# Patient Record
Sex: Male | Born: 1955 | Race: White | Hispanic: No | Marital: Married | Smoking: Current every day smoker
Health system: Southern US, Community
[De-identification: ages and names within clinical notes are randomized; demographics above are authoritative.]

## PROBLEM LIST (undated history)

## (undated) DIAGNOSIS — I10 Essential (primary) hypertension: Secondary | ICD-10-CM

## (undated) DIAGNOSIS — G5793 Unspecified mononeuropathy of bilateral lower limbs: Secondary | ICD-10-CM

## (undated) DIAGNOSIS — M1990 Unspecified osteoarthritis, unspecified site: Secondary | ICD-10-CM

## (undated) DIAGNOSIS — Z9221 Personal history of antineoplastic chemotherapy: Secondary | ICD-10-CM

## (undated) DIAGNOSIS — K759 Inflammatory liver disease, unspecified: Secondary | ICD-10-CM

## (undated) DIAGNOSIS — K219 Gastro-esophageal reflux disease without esophagitis: Secondary | ICD-10-CM

## (undated) DIAGNOSIS — I639 Cerebral infarction, unspecified: Secondary | ICD-10-CM

## (undated) DIAGNOSIS — I4891 Unspecified atrial fibrillation: Secondary | ICD-10-CM

## (undated) DIAGNOSIS — E119 Type 2 diabetes mellitus without complications: Secondary | ICD-10-CM

## (undated) DIAGNOSIS — R0600 Dyspnea, unspecified: Secondary | ICD-10-CM

## (undated) DIAGNOSIS — C801 Malignant (primary) neoplasm, unspecified: Secondary | ICD-10-CM

## (undated) DIAGNOSIS — M75100 Unspecified rotator cuff tear or rupture of unspecified shoulder, not specified as traumatic: Secondary | ICD-10-CM

## (undated) DIAGNOSIS — R200 Anesthesia of skin: Secondary | ICD-10-CM

## (undated) DIAGNOSIS — J449 Chronic obstructive pulmonary disease, unspecified: Secondary | ICD-10-CM

## (undated) HISTORY — PX: TRIGGER FINGER RELEASE: SHX641

## (undated) HISTORY — PX: ARTHROSCOPIC REPAIR ACL: SUR80

## (undated) HISTORY — PX: FOOT SURGERY: SHX648

## (undated) HISTORY — PX: HAND SURGERY: SHX662

---

## 1999-07-21 ENCOUNTER — Encounter: Payer: Self-pay | Admitting: Gastroenterology

## 1999-07-21 ENCOUNTER — Encounter: Admission: RE | Admit: 1999-07-21 | Discharge: 1999-07-21 | Payer: Self-pay | Admitting: Gastroenterology

## 2004-11-13 ENCOUNTER — Encounter: Admission: RE | Admit: 2004-11-13 | Discharge: 2004-11-13 | Payer: Self-pay | Admitting: Gastroenterology

## 2005-07-08 ENCOUNTER — Ambulatory Visit (HOSPITAL_COMMUNITY): Admission: RE | Admit: 2005-07-08 | Discharge: 2005-07-08 | Payer: Self-pay | Admitting: *Deleted

## 2006-04-15 ENCOUNTER — Ambulatory Visit (HOSPITAL_COMMUNITY): Admission: RE | Admit: 2006-04-15 | Discharge: 2006-04-15 | Payer: Self-pay | Admitting: Orthopedic Surgery

## 2006-04-29 ENCOUNTER — Ambulatory Visit (HOSPITAL_COMMUNITY): Admission: RE | Admit: 2006-04-29 | Discharge: 2006-04-30 | Payer: Self-pay | Admitting: Orthopedic Surgery

## 2006-06-14 ENCOUNTER — Encounter: Admission: RE | Admit: 2006-06-14 | Discharge: 2006-06-14 | Payer: Self-pay | Admitting: Family Medicine

## 2007-06-08 DIAGNOSIS — J189 Pneumonia, unspecified organism: Secondary | ICD-10-CM

## 2007-06-08 HISTORY — PX: OTHER SURGICAL HISTORY: SHX169

## 2007-06-08 HISTORY — PX: LOBECTOMY: SHX5089

## 2007-06-08 HISTORY — DX: Pneumonia, unspecified organism: J18.9

## 2007-11-17 ENCOUNTER — Encounter: Admission: RE | Admit: 2007-11-17 | Discharge: 2007-11-17 | Payer: Self-pay | Admitting: Family Medicine

## 2007-11-20 ENCOUNTER — Ambulatory Visit: Payer: Self-pay | Admitting: Pulmonary Disease

## 2007-11-20 DIAGNOSIS — R930 Abnormal findings on diagnostic imaging of skull and head, not elsewhere classified: Secondary | ICD-10-CM

## 2007-11-20 DIAGNOSIS — R042 Hemoptysis: Secondary | ICD-10-CM | POA: Insufficient documentation

## 2007-11-23 ENCOUNTER — Ambulatory Visit: Payer: Self-pay | Admitting: Pulmonary Disease

## 2007-11-23 ENCOUNTER — Ambulatory Visit: Admission: RE | Admit: 2007-11-23 | Discharge: 2007-11-23 | Payer: Self-pay | Admitting: Pulmonary Disease

## 2007-11-23 ENCOUNTER — Encounter: Payer: Self-pay | Admitting: Pulmonary Disease

## 2007-11-27 ENCOUNTER — Ambulatory Visit: Payer: Self-pay | Admitting: Pulmonary Disease

## 2007-11-27 DIAGNOSIS — C3492 Malignant neoplasm of unspecified part of left bronchus or lung: Secondary | ICD-10-CM

## 2007-12-01 ENCOUNTER — Ambulatory Visit: Admission: RE | Admit: 2007-12-01 | Discharge: 2007-12-01 | Payer: Self-pay | Admitting: Cardiology

## 2007-12-01 ENCOUNTER — Ambulatory Visit (HOSPITAL_COMMUNITY): Admission: RE | Admit: 2007-12-01 | Discharge: 2007-12-01 | Payer: Self-pay | Admitting: Pulmonary Disease

## 2007-12-01 ENCOUNTER — Encounter: Payer: Self-pay | Admitting: Pulmonary Disease

## 2007-12-04 ENCOUNTER — Telehealth (INDEPENDENT_AMBULATORY_CARE_PROVIDER_SITE_OTHER): Payer: Self-pay | Admitting: *Deleted

## 2007-12-19 ENCOUNTER — Ambulatory Visit: Payer: Self-pay | Admitting: Thoracic Surgery

## 2007-12-28 ENCOUNTER — Ambulatory Visit: Payer: Self-pay | Admitting: Pulmonary Disease

## 2007-12-28 ENCOUNTER — Inpatient Hospital Stay (HOSPITAL_COMMUNITY): Admission: RE | Admit: 2007-12-28 | Discharge: 2008-01-08 | Payer: Self-pay | Admitting: Thoracic Surgery

## 2007-12-28 ENCOUNTER — Encounter: Payer: Self-pay | Admitting: Thoracic Surgery

## 2008-01-02 ENCOUNTER — Ambulatory Visit: Payer: Self-pay | Admitting: Thoracic Surgery

## 2008-01-12 ENCOUNTER — Telehealth (INDEPENDENT_AMBULATORY_CARE_PROVIDER_SITE_OTHER): Payer: Self-pay | Admitting: *Deleted

## 2008-01-17 ENCOUNTER — Encounter: Admission: RE | Admit: 2008-01-17 | Discharge: 2008-01-17 | Payer: Self-pay | Admitting: Thoracic Surgery

## 2008-01-17 ENCOUNTER — Ambulatory Visit: Payer: Self-pay | Admitting: Thoracic Surgery

## 2008-01-18 ENCOUNTER — Observation Stay (HOSPITAL_COMMUNITY): Admission: AD | Admit: 2008-01-18 | Discharge: 2008-01-23 | Payer: Self-pay | Admitting: Thoracic Surgery

## 2008-02-07 ENCOUNTER — Ambulatory Visit: Payer: Self-pay | Admitting: Internal Medicine

## 2008-02-07 ENCOUNTER — Encounter: Admission: RE | Admit: 2008-02-07 | Discharge: 2008-02-07 | Payer: Self-pay | Admitting: Thoracic Surgery

## 2008-02-07 ENCOUNTER — Ambulatory Visit: Payer: Self-pay | Admitting: Thoracic Surgery

## 2008-02-13 ENCOUNTER — Ambulatory Visit: Payer: Self-pay | Admitting: Pulmonary Disease

## 2008-02-21 LAB — CBC WITH DIFFERENTIAL/PLATELET
Basophils Absolute: 0.1 10*3/uL (ref 0.0–0.1)
EOS%: 3.5 % (ref 0.0–7.0)
HCT: 39.4 % (ref 38.7–49.9)
HGB: 13.4 g/dL (ref 13.0–17.1)
MCH: 31.9 pg (ref 28.0–33.4)
MCV: 93.9 fL (ref 81.6–98.0)
MONO%: 7.9 % (ref 0.0–13.0)
NEUT%: 65.6 % (ref 40.0–75.0)
Platelets: 426 10*3/uL — ABNORMAL HIGH (ref 145–400)

## 2008-02-21 LAB — COMPREHENSIVE METABOLIC PANEL
AST: 12 U/L (ref 0–37)
Alkaline Phosphatase: 67 U/L (ref 39–117)
BUN: 6 mg/dL (ref 6–23)
Calcium: 9.4 mg/dL (ref 8.4–10.5)
Chloride: 102 mEq/L (ref 96–112)
Creatinine, Ser: 1.11 mg/dL (ref 0.40–1.50)

## 2008-02-26 LAB — CBC WITH DIFFERENTIAL/PLATELET
Basophils Absolute: 0 10*3/uL (ref 0.0–0.1)
EOS%: 0 % (ref 0.0–7.0)
HCT: 42.1 % (ref 38.7–49.9)
HGB: 14.1 g/dL (ref 13.0–17.1)
LYMPH%: 10.8 % — ABNORMAL LOW (ref 14.0–48.0)
MCH: 31.5 pg (ref 28.0–33.4)
NEUT%: 84.7 % — ABNORMAL HIGH (ref 40.0–75.0)
Platelets: 454 10*3/uL — ABNORMAL HIGH (ref 145–400)
lymph#: 1.3 10*3/uL (ref 0.9–3.3)

## 2008-02-26 LAB — COMPREHENSIVE METABOLIC PANEL
AST: 14 U/L (ref 0–37)
BUN: 12 mg/dL (ref 6–23)
CO2: 20 mEq/L (ref 19–32)
Calcium: 9.2 mg/dL (ref 8.4–10.5)
Chloride: 100 mEq/L (ref 96–112)
Creatinine, Ser: 1.02 mg/dL (ref 0.40–1.50)
Total Bilirubin: 0.4 mg/dL (ref 0.3–1.2)

## 2008-02-26 LAB — MAGNESIUM: Magnesium: 1.7 mg/dL (ref 1.5–2.5)

## 2008-02-28 ENCOUNTER — Ambulatory Visit: Payer: Self-pay | Admitting: Thoracic Surgery

## 2008-02-28 ENCOUNTER — Encounter: Admission: RE | Admit: 2008-02-28 | Discharge: 2008-02-28 | Payer: Self-pay | Admitting: Thoracic Surgery

## 2008-02-28 ENCOUNTER — Ambulatory Visit (HOSPITAL_COMMUNITY): Admission: RE | Admit: 2008-02-28 | Discharge: 2008-02-28 | Payer: Self-pay | Admitting: Internal Medicine

## 2008-03-04 LAB — COMPREHENSIVE METABOLIC PANEL
AST: 32 U/L (ref 0–37)
Albumin: 4.4 g/dL (ref 3.5–5.2)
BUN: 15 mg/dL (ref 6–23)
CO2: 28 mEq/L (ref 19–32)
Calcium: 9.8 mg/dL (ref 8.4–10.5)
Chloride: 92 mEq/L — ABNORMAL LOW (ref 96–112)
Glucose, Bld: 143 mg/dL — ABNORMAL HIGH (ref 70–99)
Potassium: 3.5 mEq/L (ref 3.5–5.3)

## 2008-03-04 LAB — CBC WITH DIFFERENTIAL/PLATELET
Basophils Absolute: 0 10*3/uL (ref 0.0–0.1)
Eosinophils Absolute: 0.1 10*3/uL (ref 0.0–0.5)
HCT: 43.8 % (ref 38.7–49.9)
HGB: 14.9 g/dL (ref 13.0–17.1)
NEUT#: 6.6 10*3/uL — ABNORMAL HIGH (ref 1.5–6.5)
RDW: 15.1 % — ABNORMAL HIGH (ref 11.2–14.6)
lymph#: 2.6 10*3/uL (ref 0.9–3.3)

## 2008-03-04 LAB — MAGNESIUM: Magnesium: 1.6 mg/dL (ref 1.5–2.5)

## 2008-03-11 LAB — MAGNESIUM: Magnesium: 1.3 mg/dL — ABNORMAL LOW (ref 1.5–2.5)

## 2008-03-11 LAB — CBC WITH DIFFERENTIAL/PLATELET
Basophils Absolute: 0.1 10*3/uL (ref 0.0–0.1)
Eosinophils Absolute: 0 10*3/uL (ref 0.0–0.5)
HCT: 39.6 % (ref 38.7–49.9)
HGB: 13.3 g/dL (ref 13.0–17.1)
LYMPH%: 12 % — ABNORMAL LOW (ref 14.0–48.0)
MCV: 93.5 fL (ref 81.6–98.0)
MONO%: 1.2 % (ref 0.0–13.0)
NEUT#: 20.3 10*3/uL — ABNORMAL HIGH (ref 1.5–6.5)
NEUT%: 86.3 % — ABNORMAL HIGH (ref 40.0–75.0)
Platelets: 230 10*3/uL (ref 145–400)

## 2008-03-11 LAB — COMPREHENSIVE METABOLIC PANEL
Alkaline Phosphatase: 78 U/L (ref 39–117)
BUN: 14 mg/dL (ref 6–23)
CO2: 29 mEq/L (ref 19–32)
Glucose, Bld: 113 mg/dL — ABNORMAL HIGH (ref 70–99)
Total Bilirubin: 0.3 mg/dL (ref 0.3–1.2)

## 2008-03-18 LAB — COMPREHENSIVE METABOLIC PANEL
ALT: 18 U/L (ref 0–53)
Albumin: 3.6 g/dL (ref 3.5–5.2)
CO2: 26 mEq/L (ref 19–32)
Calcium: 9.6 mg/dL (ref 8.4–10.5)
Chloride: 102 mEq/L (ref 96–112)
Potassium: 4.6 mEq/L (ref 3.5–5.3)
Sodium: 138 mEq/L (ref 135–145)
Total Protein: 6.2 g/dL (ref 6.0–8.3)

## 2008-03-18 LAB — MAGNESIUM: Magnesium: 1.8 mg/dL (ref 1.5–2.5)

## 2008-03-18 LAB — CBC WITH DIFFERENTIAL/PLATELET
BASO%: 1.4 % (ref 0.0–2.0)
EOS%: 1.7 % (ref 0.0–7.0)
HCT: 38.2 % — ABNORMAL LOW (ref 38.7–49.9)
MCH: 32.2 pg (ref 28.0–33.4)
MCHC: 34.6 g/dL (ref 32.0–35.9)
NEUT%: 70.1 % (ref 40.0–75.0)
RDW: 15.2 % — ABNORMAL HIGH (ref 11.2–14.6)
lymph#: 2.4 10*3/uL (ref 0.9–3.3)

## 2008-03-26 ENCOUNTER — Ambulatory Visit: Payer: Self-pay | Admitting: Internal Medicine

## 2008-03-26 LAB — COMPREHENSIVE METABOLIC PANEL
ALT: 24 U/L (ref 0–53)
AST: 15 U/L (ref 0–37)
Albumin: 4.4 g/dL (ref 3.5–5.2)
Calcium: 9.5 mg/dL (ref 8.4–10.5)
Chloride: 102 mEq/L (ref 96–112)
Potassium: 4.8 mEq/L (ref 3.5–5.3)
Sodium: 139 mEq/L (ref 135–145)
Total Protein: 7.2 g/dL (ref 6.0–8.3)

## 2008-03-26 LAB — CBC WITH DIFFERENTIAL/PLATELET
BASO%: 0.4 % (ref 0.0–2.0)
Basophils Absolute: 0.1 10*3/uL (ref 0.0–0.1)
EOS%: 0.4 % (ref 0.0–7.0)
HGB: 13.7 g/dL (ref 13.0–17.1)
MCH: 31.3 pg (ref 28.0–33.4)
RDW: 15.9 % — ABNORMAL HIGH (ref 11.2–14.6)
lymph#: 3.4 10*3/uL — ABNORMAL HIGH (ref 0.9–3.3)

## 2008-04-02 ENCOUNTER — Encounter: Admission: RE | Admit: 2008-04-02 | Discharge: 2008-04-02 | Payer: Self-pay | Admitting: Thoracic Surgery

## 2008-04-02 ENCOUNTER — Ambulatory Visit: Payer: Self-pay | Admitting: Thoracic Surgery

## 2008-04-02 LAB — CBC WITH DIFFERENTIAL/PLATELET
BASO%: 0.4 % (ref 0.0–2.0)
EOS%: 0.2 % (ref 0.0–7.0)
HCT: 40.8 % (ref 38.7–49.9)
LYMPH%: 13.3 % — ABNORMAL LOW (ref 14.0–48.0)
MCH: 30.8 pg (ref 28.0–33.4)
MCHC: 33.5 g/dL (ref 32.0–35.9)
NEUT%: 80.8 % — ABNORMAL HIGH (ref 40.0–75.0)
RBC: 4.44 10*6/uL (ref 4.20–5.71)
lymph#: 2.4 10*3/uL (ref 0.9–3.3)

## 2008-04-02 LAB — COMPREHENSIVE METABOLIC PANEL
ALT: 12 U/L (ref 0–53)
AST: 15 U/L (ref 0–37)
Chloride: 103 mEq/L (ref 96–112)
Creatinine, Ser: 1.43 mg/dL (ref 0.40–1.50)
Total Bilirubin: 0.3 mg/dL (ref 0.3–1.2)

## 2008-04-08 LAB — CBC WITH DIFFERENTIAL/PLATELET
Basophils Absolute: 0 10*3/uL (ref 0.0–0.1)
LYMPH%: 7.5 % — ABNORMAL LOW (ref 14.0–48.0)
MCH: 30.8 pg (ref 28.0–33.4)
MCV: 90 fL (ref 81.6–98.0)
NEUT#: 15.3 10*3/uL — ABNORMAL HIGH (ref 1.5–6.5)
NEUT%: 86.9 % — ABNORMAL HIGH (ref 40.0–75.0)
RBC: 4.37 10*6/uL (ref 4.20–5.71)
RDW: 15.5 % — ABNORMAL HIGH (ref 11.2–14.6)

## 2008-04-08 LAB — MAGNESIUM: Magnesium: 1.9 mg/dL (ref 1.5–2.5)

## 2008-04-08 LAB — COMPREHENSIVE METABOLIC PANEL
ALT: 12 U/L (ref 0–53)
BUN: 17 mg/dL (ref 6–23)
CO2: 21 mEq/L (ref 19–32)
Calcium: 9.8 mg/dL (ref 8.4–10.5)
Chloride: 102 mEq/L (ref 96–112)
Creatinine, Ser: 1.24 mg/dL (ref 0.40–1.50)
Glucose, Bld: 138 mg/dL — ABNORMAL HIGH (ref 70–99)
Total Bilirubin: 0.3 mg/dL (ref 0.3–1.2)

## 2008-04-29 LAB — COMPREHENSIVE METABOLIC PANEL
AST: 14 U/L (ref 0–37)
Albumin: 4.4 g/dL (ref 3.5–5.2)
Alkaline Phosphatase: 77 U/L (ref 39–117)
BUN: 18 mg/dL (ref 6–23)
Creatinine, Ser: 1.13 mg/dL (ref 0.40–1.50)
Potassium: 5 mEq/L (ref 3.5–5.3)
Total Bilirubin: 0.4 mg/dL (ref 0.3–1.2)

## 2008-04-29 LAB — CBC WITH DIFFERENTIAL/PLATELET
Basophils Absolute: 0 10*3/uL (ref 0.0–0.1)
EOS%: 0.1 % (ref 0.0–7.0)
HCT: 39.2 % (ref 38.7–49.9)
HGB: 13.4 g/dL (ref 13.0–17.1)
MCH: 31.2 pg (ref 28.0–33.4)
MONO#: 0.6 10*3/uL (ref 0.1–0.9)
NEUT#: 18.4 10*3/uL — ABNORMAL HIGH (ref 1.5–6.5)
RDW: 15.9 % — ABNORMAL HIGH (ref 11.2–14.6)
WBC: 20.2 10*3/uL — ABNORMAL HIGH (ref 4.0–10.0)
lymph#: 1.3 10*3/uL (ref 0.9–3.3)

## 2008-05-14 ENCOUNTER — Ambulatory Visit (HOSPITAL_COMMUNITY): Admission: RE | Admit: 2008-05-14 | Discharge: 2008-05-14 | Payer: Self-pay | Admitting: Internal Medicine

## 2008-05-16 ENCOUNTER — Ambulatory Visit: Payer: Self-pay | Admitting: Internal Medicine

## 2008-05-20 LAB — CBC WITH DIFFERENTIAL/PLATELET
Eosinophils Absolute: 0 10*3/uL (ref 0.0–0.5)
MCV: 94.9 fL (ref 81.6–98.0)
MONO%: 8.4 % (ref 0.0–13.0)
NEUT#: 11.7 10*3/uL — ABNORMAL HIGH (ref 1.5–6.5)
RBC: 3.89 10*6/uL — ABNORMAL LOW (ref 4.20–5.71)
RDW: 16.9 % — ABNORMAL HIGH (ref 11.2–14.6)
WBC: 15.1 10*3/uL — ABNORMAL HIGH (ref 4.0–10.0)
lymph#: 2 10*3/uL (ref 0.9–3.3)

## 2008-05-20 LAB — COMPREHENSIVE METABOLIC PANEL
AST: 12 U/L (ref 0–37)
Albumin: 4.2 g/dL (ref 3.5–5.2)
Alkaline Phosphatase: 72 U/L (ref 39–117)
Glucose, Bld: 98 mg/dL (ref 70–99)
Potassium: 4.8 mEq/L (ref 3.5–5.3)
Sodium: 138 mEq/L (ref 135–145)
Total Protein: 6.5 g/dL (ref 6.0–8.3)

## 2008-07-10 ENCOUNTER — Ambulatory Visit: Payer: Self-pay | Admitting: Thoracic Surgery

## 2008-07-10 ENCOUNTER — Encounter: Admission: RE | Admit: 2008-07-10 | Discharge: 2008-07-10 | Payer: Self-pay | Admitting: Thoracic Surgery

## 2008-08-13 ENCOUNTER — Ambulatory Visit: Payer: Self-pay | Admitting: Internal Medicine

## 2008-08-15 ENCOUNTER — Ambulatory Visit (HOSPITAL_COMMUNITY): Admission: RE | Admit: 2008-08-15 | Discharge: 2008-08-15 | Payer: Self-pay | Admitting: Internal Medicine

## 2008-08-15 LAB — CBC WITH DIFFERENTIAL/PLATELET
BASO%: 0.7 % (ref 0.0–2.0)
Basophils Absolute: 0.1 10*3/uL (ref 0.0–0.1)
EOS%: 0.8 % (ref 0.0–7.0)
HCT: 49.7 % (ref 38.4–49.9)
LYMPH%: 24.7 % (ref 14.0–49.0)
MCH: 32.1 pg (ref 27.2–33.4)
MCHC: 33.6 g/dL (ref 32.0–36.0)
MCV: 95.5 fL (ref 79.3–98.0)
MONO%: 8.7 % (ref 0.0–14.0)
NEUT%: 65.1 % (ref 39.0–75.0)
Platelets: 363 10*3/uL (ref 140–400)

## 2008-08-15 LAB — COMPREHENSIVE METABOLIC PANEL
ALT: 20 U/L (ref 0–53)
AST: 23 U/L (ref 0–37)
BUN: 9 mg/dL (ref 6–23)
Creatinine, Ser: 1.2 mg/dL (ref 0.40–1.50)
Total Bilirubin: 0.5 mg/dL (ref 0.3–1.2)

## 2008-08-30 ENCOUNTER — Ambulatory Visit: Payer: Self-pay | Admitting: Pulmonary Disease

## 2008-10-02 ENCOUNTER — Ambulatory Visit: Payer: Self-pay | Admitting: Thoracic Surgery

## 2008-10-24 ENCOUNTER — Ambulatory Visit: Payer: Self-pay | Admitting: Internal Medicine

## 2008-10-24 DIAGNOSIS — Z860100 Personal history of colon polyps, unspecified: Secondary | ICD-10-CM | POA: Insufficient documentation

## 2008-10-24 DIAGNOSIS — G609 Hereditary and idiopathic neuropathy, unspecified: Secondary | ICD-10-CM | POA: Insufficient documentation

## 2008-10-31 ENCOUNTER — Telehealth: Payer: Self-pay | Admitting: Internal Medicine

## 2008-11-15 ENCOUNTER — Ambulatory Visit: Payer: Self-pay | Admitting: Internal Medicine

## 2008-11-15 ENCOUNTER — Ambulatory Visit (HOSPITAL_COMMUNITY): Admission: RE | Admit: 2008-11-15 | Discharge: 2008-11-15 | Payer: Self-pay | Admitting: Internal Medicine

## 2008-11-15 LAB — CBC WITH DIFFERENTIAL/PLATELET
BASO%: 0.4 % (ref 0.0–2.0)
EOS%: 2.2 % (ref 0.0–7.0)
HCT: 45 % (ref 38.4–49.9)
MCH: 35 pg — ABNORMAL HIGH (ref 27.2–33.4)
MCHC: 36 g/dL (ref 32.0–36.0)
MONO%: 6.9 % (ref 0.0–14.0)
NEUT%: 66.5 % (ref 39.0–75.0)
RDW: 14.4 % (ref 11.0–14.6)
lymph#: 2.5 10*3/uL (ref 0.9–3.3)

## 2008-11-15 LAB — COMPREHENSIVE METABOLIC PANEL
ALT: 19 U/L (ref 0–53)
AST: 23 U/L (ref 0–37)
Alkaline Phosphatase: 93 U/L (ref 39–117)
Calcium: 9.4 mg/dL (ref 8.4–10.5)
Chloride: 104 mEq/L (ref 96–112)
Creatinine, Ser: 1.25 mg/dL (ref 0.40–1.50)

## 2008-11-21 ENCOUNTER — Ambulatory Visit: Payer: Self-pay | Admitting: Internal Medicine

## 2008-12-25 ENCOUNTER — Ambulatory Visit: Payer: Self-pay | Admitting: Thoracic Surgery

## 2009-02-14 ENCOUNTER — Ambulatory Visit: Payer: Self-pay | Admitting: Internal Medicine

## 2009-02-14 DIAGNOSIS — M545 Low back pain: Secondary | ICD-10-CM | POA: Insufficient documentation

## 2009-02-19 ENCOUNTER — Ambulatory Visit: Payer: Self-pay | Admitting: Internal Medicine

## 2009-02-20 ENCOUNTER — Telehealth: Payer: Self-pay | Admitting: Internal Medicine

## 2009-02-21 ENCOUNTER — Ambulatory Visit (HOSPITAL_COMMUNITY): Admission: RE | Admit: 2009-02-21 | Discharge: 2009-02-21 | Payer: Self-pay | Admitting: Internal Medicine

## 2009-02-21 LAB — CBC WITH DIFFERENTIAL/PLATELET
BASO%: 0.6 % (ref 0.0–2.0)
EOS%: 4 % (ref 0.0–7.0)
Eosinophils Absolute: 0.3 10*3/uL (ref 0.0–0.5)
LYMPH%: 31.8 % (ref 14.0–49.0)
MCH: 35.4 pg — ABNORMAL HIGH (ref 27.2–33.4)
MCHC: 34.8 g/dL (ref 32.0–36.0)
MCV: 101.8 fL — ABNORMAL HIGH (ref 79.3–98.0)
MONO%: 8.3 % (ref 0.0–14.0)
NEUT#: 4.8 10*3/uL (ref 1.5–6.5)
RBC: 4.32 10*6/uL (ref 4.20–5.82)
RDW: 13.8 % (ref 11.0–14.6)

## 2009-02-21 LAB — COMPREHENSIVE METABOLIC PANEL
ALT: 21 U/L (ref 0–53)
AST: 24 U/L (ref 0–37)
Albumin: 3.9 g/dL (ref 3.5–5.2)
Calcium: 9.2 mg/dL (ref 8.4–10.5)
Chloride: 103 mEq/L (ref 96–112)
Potassium: 4.2 mEq/L (ref 3.5–5.3)

## 2009-02-26 ENCOUNTER — Ambulatory Visit: Payer: Self-pay | Admitting: Thoracic Surgery

## 2009-06-18 ENCOUNTER — Ambulatory Visit: Payer: Self-pay | Admitting: Internal Medicine

## 2009-06-20 ENCOUNTER — Ambulatory Visit (HOSPITAL_COMMUNITY): Admission: RE | Admit: 2009-06-20 | Discharge: 2009-06-20 | Payer: Self-pay | Admitting: Internal Medicine

## 2009-06-20 LAB — CBC WITH DIFFERENTIAL/PLATELET
Basophils Absolute: 0.1 10*3/uL (ref 0.0–0.1)
EOS%: 3 % (ref 0.0–7.0)
Eosinophils Absolute: 0.3 10*3/uL (ref 0.0–0.5)
HGB: 14.7 g/dL (ref 13.0–17.1)
LYMPH%: 29.1 % (ref 14.0–49.0)
MCH: 34.5 pg — ABNORMAL HIGH (ref 27.2–33.4)
MCV: 101.6 fL — ABNORMAL HIGH (ref 79.3–98.0)
MONO%: 9.5 % (ref 0.0–14.0)
Platelets: 334 10*3/uL (ref 140–400)
RDW: 12.9 % (ref 11.0–14.6)

## 2009-06-20 LAB — COMPREHENSIVE METABOLIC PANEL
AST: 29 U/L (ref 0–37)
Albumin: 4.4 g/dL (ref 3.5–5.2)
Alkaline Phosphatase: 64 U/L (ref 39–117)
BUN: 12 mg/dL (ref 6–23)
Creatinine, Ser: 1.39 mg/dL (ref 0.40–1.50)
Glucose, Bld: 106 mg/dL — ABNORMAL HIGH (ref 70–99)
Potassium: 4.5 mEq/L (ref 3.5–5.3)
Total Bilirubin: 0.8 mg/dL (ref 0.3–1.2)

## 2009-07-18 ENCOUNTER — Ambulatory Visit: Payer: Self-pay | Admitting: Thoracic Surgery

## 2009-08-22 ENCOUNTER — Ambulatory Visit: Payer: Self-pay | Admitting: Internal Medicine

## 2009-10-08 ENCOUNTER — Telehealth: Payer: Self-pay | Admitting: Internal Medicine

## 2009-10-17 ENCOUNTER — Ambulatory Visit: Payer: Self-pay | Admitting: Internal Medicine

## 2009-10-17 ENCOUNTER — Ambulatory Visit (HOSPITAL_COMMUNITY): Admission: RE | Admit: 2009-10-17 | Discharge: 2009-10-17 | Payer: Self-pay | Admitting: Internal Medicine

## 2009-10-17 LAB — CBC WITH DIFFERENTIAL/PLATELET
Basophils Absolute: 0 10*3/uL (ref 0.0–0.1)
EOS%: 2.1 % (ref 0.0–7.0)
Eosinophils Absolute: 0.2 10*3/uL (ref 0.0–0.5)
HCT: 47 % (ref 38.4–49.9)
HGB: 16 g/dL (ref 13.0–17.1)
MCH: 34.9 pg — ABNORMAL HIGH (ref 27.2–33.4)
MCV: 102.5 fL — ABNORMAL HIGH (ref 79.3–98.0)
MONO%: 8.4 % (ref 0.0–14.0)
NEUT#: 6.4 10*3/uL (ref 1.5–6.5)
NEUT%: 62.9 % (ref 39.0–75.0)
Platelets: 375 10*3/uL (ref 140–400)
RDW: 13.2 % (ref 11.0–14.6)

## 2009-10-17 LAB — COMPREHENSIVE METABOLIC PANEL
AST: 29 U/L (ref 0–37)
Albumin: 4.3 g/dL (ref 3.5–5.2)
Alkaline Phosphatase: 56 U/L (ref 39–117)
BUN: 9 mg/dL (ref 6–23)
Calcium: 9.5 mg/dL (ref 8.4–10.5)
Creatinine, Ser: 1.25 mg/dL (ref 0.40–1.50)
Glucose, Bld: 127 mg/dL — ABNORMAL HIGH (ref 70–99)
Potassium: 4.5 mEq/L (ref 3.5–5.3)

## 2009-10-21 ENCOUNTER — Ambulatory Visit: Payer: Self-pay | Admitting: Diagnostic Radiology

## 2009-10-21 ENCOUNTER — Encounter: Payer: Self-pay | Admitting: Emergency Medicine

## 2009-10-21 ENCOUNTER — Ambulatory Visit (HOSPITAL_COMMUNITY): Admission: EM | Admit: 2009-10-21 | Discharge: 2009-10-21 | Payer: Self-pay | Admitting: Orthopedic Surgery

## 2009-11-05 ENCOUNTER — Ambulatory Visit: Payer: Self-pay | Admitting: Thoracic Surgery

## 2009-11-17 ENCOUNTER — Telehealth: Payer: Self-pay | Admitting: Internal Medicine

## 2009-11-21 ENCOUNTER — Telehealth: Payer: Self-pay | Admitting: Internal Medicine

## 2009-12-18 ENCOUNTER — Telehealth: Payer: Self-pay | Admitting: Internal Medicine

## 2010-01-05 ENCOUNTER — Telehealth: Payer: Self-pay | Admitting: Internal Medicine

## 2010-04-15 ENCOUNTER — Ambulatory Visit: Payer: Self-pay | Admitting: Internal Medicine

## 2010-04-17 ENCOUNTER — Ambulatory Visit (HOSPITAL_COMMUNITY): Admission: RE | Admit: 2010-04-17 | Discharge: 2010-04-17 | Payer: Self-pay | Admitting: Internal Medicine

## 2010-04-17 LAB — COMPREHENSIVE METABOLIC PANEL
Albumin: 4.2 g/dL (ref 3.5–5.2)
BUN: 11 mg/dL (ref 6–23)
Calcium: 9.3 mg/dL (ref 8.4–10.5)
Chloride: 102 mEq/L (ref 96–112)
Creatinine, Ser: 1.43 mg/dL (ref 0.40–1.50)
Glucose, Bld: 125 mg/dL — ABNORMAL HIGH (ref 70–99)
Potassium: 4.4 mEq/L (ref 3.5–5.3)

## 2010-04-17 LAB — CBC WITH DIFFERENTIAL/PLATELET
Basophils Absolute: 0.1 10*3/uL (ref 0.0–0.1)
Eosinophils Absolute: 0.2 10*3/uL (ref 0.0–0.5)
HCT: 47.9 % (ref 38.4–49.9)
HGB: 16.5 g/dL (ref 13.0–17.1)
MCH: 34.8 pg — ABNORMAL HIGH (ref 27.2–33.4)
MCV: 101.2 fL — ABNORMAL HIGH (ref 79.3–98.0)
NEUT#: 5 10*3/uL (ref 1.5–6.5)
NEUT%: 56.1 % (ref 39.0–75.0)
RDW: 13.7 % (ref 11.0–14.6)
lymph#: 2.5 10*3/uL (ref 0.9–3.3)

## 2010-04-23 ENCOUNTER — Encounter: Payer: Self-pay | Admitting: Pulmonary Disease

## 2010-06-27 ENCOUNTER — Other Ambulatory Visit: Payer: Self-pay | Admitting: Internal Medicine

## 2010-06-27 DIAGNOSIS — C3490 Malignant neoplasm of unspecified part of unspecified bronchus or lung: Secondary | ICD-10-CM

## 2010-07-07 NOTE — Assessment & Plan Note (Signed)
Summary: 6 MO ROV/MM   Vital Signs:  Patient profile:   55 year old male Weight:      194 pounds Temp:     98.5 degrees F oral BP sitting:   110 / 70  (left arm) Cuff size:   regular  Vitals Entered By: Duard Brady LPN (August 22, 2009 9:57 AM) CC: 6 mos rov - doing ok ,asked about increase on pain med  Is Patient Diabetic? No   Primary Care Provider:  Mila Palmer  CC:  6 mos rov - doing ok  and asked about increase on pain med .  History of Present Illness: a 55 year old patient who established at this practice.  Approximately 10 months ago.  He is status post lobectomy for squamous cell carcinoma of the left lung approximately 2 years ago.  He is followed by oncology and also by thoracic surgery.  He states that he is scheduled to have an annual exam by another primary care physician in the near future.  It is unclear why he has two primary care providers.  Clinically, he is doing well, and he requests medication refills.  This will be provided  for one month and further refills will be provided by his primary care provider  Preventive Screening-Counseling & Management  Alcohol-Tobacco     Smoking Status: current  Allergies (verified): No Known Drug Allergies  Past History:  Past Medical History: Reviewed history from 10/24/2008 and no changes required. history of hepatitis C treated with immunologic therapy squamous cell carcinoma, lung, July 2009 Colonic polyps, hx of  low back pain possible peripheral neuropathy  Past Surgical History: Reviewed history from 10/24/2008 and no changes required. Lumbar laminectomy November 2008 Lung-lobectomy December 28, 2007 left-hand  surgery for crush injury every 2008  Social History: Smoking Status:  current  Review of Systems  The patient denies anorexia, fever, weight loss, weight gain, vision loss, decreased hearing, hoarseness, chest pain, syncope, dyspnea on exertion, peripheral edema, prolonged cough, headaches,  hemoptysis, abdominal pain, melena, hematochezia, severe indigestion/heartburn, hematuria, incontinence, genital sores, muscle weakness, suspicious skin lesions, transient blindness, difficulty walking, depression, unusual weight change, abnormal bleeding, enlarged lymph nodes, angioedema, breast masses, and testicular masses.    Physical Exam  General:  overweight-appearing.  blood pressure low normaloverweight-appearing.   Head:  Normocephalic and atraumatic without obvious abnormalities. No apparent alopecia or balding. Eyes:  No corneal or conjunctival inflammation noted. EOMI. Perrla. Funduscopic exam benign, without hemorrhages, exudates or papilledema. Vision grossly normal. Mouth:  pharyngeal crowding.  pharyngeal crowding.   Neck:  No deformities, masses, or tenderness noted. Lungs:  left thoracotomy scar diminished breath sounds left lung Heart:  Normal rate and regular rhythm. S1 and S2 normal without gallop, murmur, click, rub or other extra sounds. Abdomen:  Bowel sounds positive,abdomen soft and non-tender without masses, organomegaly or hernias noted. Msk:  No deformity or scoliosis noted of thoracic or lumbar spine.   Pulses:  R and L carotid,radial,femoral,dorsalis pedis and posterior tibial pulses are full and equal bilaterally Extremities:  No clubbing, cyanosis, edema, or deformity noted with normal full range of motion of all joints.     Impression & Recommendations:  Problem # 1:  BACK PAIN, LUMBAR, CHRONIC (ICD-724.2)  His updated medication list for this problem includes:    Hydrocodone-acetaminophen 10-660 Mg Tabs (Hydrocodone-acetaminophen) .Marland Kitchen... 1 three times a day as needed  His updated medication list for this problem includes:    Hydrocodone-acetaminophen 10-660 Mg Tabs (Hydrocodone-acetaminophen) .Marland Kitchen... 1 three times a  day as needed  Problem # 2:  PERIPHERAL NEUROPATHY (ICD-356.9)  Problem # 3:  COLONIC POLYPS, HX OF (ICD-V12.72)  Complete Medication  List: 1)  Nexium 40 Mg Cpdr (Esomeprazole magnesium) .Marland Kitchen.. 1 once daily as needed 2)  Amitriptyline Hcl 50 Mg Tabs (amitriptyline Hcl)  .... One at bedtime 3)  Hydrocodone-acetaminophen 10-660 Mg Tabs (Hydrocodone-acetaminophen) .Marland Kitchen.. 1 three times a day as needed  Patient Instructions: 1)  Please schedule a follow-up appointment as needed. Prescriptions: HYDROCODONE-ACETAMINOPHEN 10-660 MG TABS (HYDROCODONE-ACETAMINOPHEN) 1 three times a day as needed  #100 x 0   Entered and Authorized by:   Gordy Savers  MD   Signed by:   Gordy Savers  MD on 08/22/2009   Method used:   Print then Give to Patient   RxID:   [id number] AMITRIPTYLINE HCL 50 MG TABS (AMITRIPTYLINE HCL) one at bedtime  #120 x 0   Entered and Authorized by:   Gordy Savers  MD   Signed by:   Gordy Savers  MD on 08/22/2009   Method used:   Print then Give to Patient   RxID:   [id number] NEXIUM 40 MG CPDR (ESOMEPRAZOLE MAGNESIUM) 1 once daily as needed  #90 x 0   Entered and Authorized by:   Gordy Savers  MD   Signed by:   Gordy Savers  MD on 08/22/2009   Method used:   Print then Give to Patient   RxID:   [id number]

## 2010-07-07 NOTE — Progress Notes (Signed)
Summary: refill hydrocodone-acteaminophen  Phone Note Refill Request Message from:  Fax from Pharmacy on Oct 08, 2009 10:51 AM  Refills Requested: Medication #1:  HYDROCODONE-ACETAMINOPHEN 10-660 MG TABS 1 three times a day as needed.   Last Ref[facility]: [facility]/2009 [facility] - ph [phone]   Method Requested: Telephone to Pharmacy Initial call taken by: Duard Brady LPN,  Oct 08, 1749 10:51 AM  Follow-up for Phone Call        #100 Follow-up by: Gordy Savers  MD,  Oct 08, 2009 12:14 PM    Prescriptions: HYDROCODONE-ACETAMINOPHEN 10-660 MG TABS (HYDROCODONE-ACETAMINOPHEN) 1 three times a day as needed  #100 x 0   Entered by:   Duard Brady LPN   Authorized by:   Gordy Savers  MD   Signed by:   Duard Brady LPN on 02/58/5277   Method used:   Historical   RxID:   [id number]  [facility] KIK

## 2010-07-07 NOTE — Letter (Signed)
Summary: [facility]  [facility]   Imported By: Lester Anchor 05/06/2010 08:44:57  _____________________________________________________________________  External Attachment:    Type:   Image     Comment:   External Document

## 2010-07-07 NOTE — Progress Notes (Signed)
Summary: refill hydrocodone-acetamin - denied  Phone Note Refill Request Message from:  Fax from Pharmacy on January 05, 2010 9:52 AM  Refills Requested: Medication #1:  HYDROCODONE-ACETAMINOPHEN 10-660 MG TABS 1 three times a day as needed.   Last Refilled: [facility]/16/2009 [facility].       Method Requested: Fax to Local Pharmacy Initial call taken by: Duard Brady LPN,  January 05, 2010 9:53 AM  Follow-up for Phone Call        denied - will need to be seen , see last request. KIK Follow-up by: Duard Brady LPN,  January 05, 2010 9:53 AM

## 2010-07-07 NOTE — Progress Notes (Signed)
Summary: refill hydrocodone-acetaminophen denied  Phone Note Refill Request Message from:  Fax from Pharmacy on December 18, 2009 2:46 PM  Refills Requested: Medication #1:  HYDROCODONE-ACETAMINOPHEN 10-660 MG TABS 1 three times a day as needed.   [facility]: [facility] [place]    [phone]   Method Requested: Telephone to Pharmacy Initial call taken by: Duard Brady LPN,  December 18, 2009 2:47 PM  Follow-up for Phone Call        determine if patient is receiving pain meds from another provider Follow-up by: Gordy Savers  MD,  December 18, 2009 4:52 PM  Additional Follow-up for Phone Call Additional follow up Details #1::        called cathy pharmacist at [facility] - 7/1 percocet 5/325 #40 Dr. Amanda Pea  , Vicodin 5/550 #45 Dr. Amanda Pea 5/18 Percocet # 15 Additional Follow-up by: Duard Brady LPN,  December 18, 2009 5:04 PM    Additional Follow-up for Phone Call Additional follow up Details #2::    no further pain meds from this office Follow-up by: Gordy Savers  MD,  December 19, 2009 12:34 PM  Additional Follow-up for Phone Call Additional follow up Details #3:: Details for Additional Follow-up Action Taken: [facility] - [facility]ill - will have to be seen - no meds at this time. KIK Additional Follow-up by: Duard Brady LPN,  December 19, 2009 1:35 PM

## 2010-07-07 NOTE — Progress Notes (Signed)
Summary: refill hydrocodone-acetaminophen  Phone Note Refill Request Message from:  Fax from Pharmacy on November 17, 2009 11:26 AM  Refills Requested: Medication #1:  HYDROCODONE-ACETAMINOPHEN 10-660 MG TABS 1 three times a day as needed.   Last [facility]: [facility] [place]   [phone]   Method Requested: Telephone to Pharmacy Initial call taken by: Duard Brady LPN,  November 17, 2009 11:26 AM  Follow-up for Phone Call        #100  Follow-up by: Gordy Savers  MD,  November 17, 2009 12:46 PM    Prescriptions: HYDROCODONE-ACETAMINOPHEN 10-660 MG TABS (HYDROCODONE-ACETAMINOPHEN) 1 three times a day as needed  #100 x 0   Entered by:   Duard Brady LPN   Authorized by:   Gordy Savers  MD   Signed by:   Duard Brady LPN on 74/25/9563   Method used:   Historical   RxID:   [id number]  [facility]   KIK  Appended Document: refill hydrocodone-acetaminophen spoke with pharm. - insurance will only pay for #90 to be disp . Change made to med list. KIK   Clinical Lists Changes  Medications: Rx of HYDROCODONE-ACETAMINOPHEN 10-660 MG TABS (HYDROCODONE-ACETAMINOPHEN) 1 three times a day as needed;  #90 x 0;  Signed;  Entered by: Duard Brady LPN;  Authorized by: Gordy Savers  MD;  Method used: Historical    Prescriptions: HYDROCODONE-ACETAMINOPHEN 10-660 MG TABS (HYDROCODONE-ACETAMINOPHEN) 1 three times a day as needed  #90 x 0   Entered by:   Duard Brady LPN   Authorized by:   Gordy Savers  MD   Signed by:   Duard Brady LPN on 60/63/0160   Method used:   Historical   RxID:   [id number]

## 2010-07-07 NOTE — Progress Notes (Signed)
Summary: refill hydrocodone- acetaminophen - error  Phone Note Refill Request Message from:  Fax from Pharmacy on November 21, 2009 12:06 PM  Refills Requested: Medication #1:  HYDROCODONE-ACETAMINOPHEN 10-660 MG TABS 1 three times a day as needed.   Last Ref[facility]: [facility]/16/2009 [facility]   [phone]   Method Requested: Telephone to Pharmacy Initial call taken by: Duard Brady LPN,  November 21, 2009 12:07 PM  Follow-up for Phone Call        was this medication  not refilled, June 14?? Follow-up by: Gordy Savers  MD,  November 21, 2009 12:40 PM  Additional Follow-up for Phone Call Additional follow up Details #1::        was filled on 11/18/09 - [facility] and [facility] with [facility]. fax was sent in error. KIK Additional Follow-up by: Duard Brady LPN,  November 21, 2009 1:12 PM

## 2010-10-16 ENCOUNTER — Ambulatory Visit (HOSPITAL_COMMUNITY)
Admission: RE | Admit: 2010-10-16 | Discharge: 2010-10-16 | Disposition: A | Discharge status: Home / Self Care | Payer: 59 | Source: Ambulatory Visit | Attending: Internal Medicine | Admitting: Internal Medicine

## 2010-10-16 ENCOUNTER — Encounter (HOSPITAL_BASED_OUTPATIENT_CLINIC_OR_DEPARTMENT_OTHER): Payer: 59 | Admitting: Internal Medicine

## 2010-10-16 ENCOUNTER — Other Ambulatory Visit: Payer: Self-pay | Admitting: Internal Medicine

## 2010-10-16 DIAGNOSIS — C3490 Malignant neoplasm of unspecified part of unspecified bronchus or lung: Secondary | ICD-10-CM | POA: Insufficient documentation

## 2010-10-16 DIAGNOSIS — E279 Disorder of adrenal gland, unspecified: Secondary | ICD-10-CM | POA: Insufficient documentation

## 2010-10-16 DIAGNOSIS — C3410 Malignant neoplasm of upper lobe, unspecified bronchus or lung: Secondary | ICD-10-CM

## 2010-10-16 DIAGNOSIS — J984 Other disorders of lung: Secondary | ICD-10-CM | POA: Insufficient documentation

## 2010-10-16 DIAGNOSIS — K7689 Other specified diseases of liver: Secondary | ICD-10-CM | POA: Insufficient documentation

## 2010-10-16 LAB — CBC WITH DIFFERENTIAL/PLATELET
Basophils Absolute: 0.1 10*3/uL (ref 0.0–0.1)
EOS%: 2.7 % (ref 0.0–7.0)
Eosinophils Absolute: 0.2 10*3/uL (ref 0.0–0.5)
HCT: 46 % (ref 38.4–49.9)
HGB: 15.9 g/dL (ref 13.0–17.1)
MONO#: 0.8 10*3/uL (ref 0.1–0.9)
NEUT#: 4.5 10*3/uL (ref 1.5–6.5)
RDW: 13.3 % (ref 11.0–14.6)
WBC: 8.4 10*3/uL (ref 4.0–10.3)
lymph#: 2.8 10*3/uL (ref 0.9–3.3)

## 2010-10-16 LAB — CMP (CANCER CENTER ONLY)
ALT(SGPT): 60 U/L — ABNORMAL HIGH (ref 10–47)
Albumin: 3.7 g/dL (ref 3.3–5.5)
Alkaline Phosphatase: 59 U/L (ref 26–84)
Glucose, Bld: 127 mg/dL — ABNORMAL HIGH (ref 73–118)
Potassium: 4.7 mEq/L (ref 3.3–4.7)
Sodium: 145 mEq/L (ref 128–145)
Total Protein: 7.5 g/dL (ref 6.4–8.1)

## 2010-10-16 MED ORDER — IOHEXOL 300 MG/ML  SOLN
100.0000 mL | Freq: Once | INTRAMUSCULAR | Status: AC | PRN
  Administered 2010-10-16: 100 mL via INTRAVENOUS

## 2010-10-20 NOTE — Assessment & Plan Note (Signed)
OFFICE VISIT   Benjamin Case, Benjamin Case  DOB:  10/27/1955                                        July 10, 2008  CHART #:  [id number]   The patient returns today.  He has completed his chemotherapy.  He is  still getting some right-sided chest pain with work, but overall appears  to be making satisfactory progress.  His blood pressure was 144/86,  pulse 74, respirations 18, and sats were 96%.  I will see him back again  in 2 months to get his next CT scan.  His last CT scan in December was  negative.  All we could see on the CT scan was some old fractures on the  right and an old fracture on the left. There was no obvious cause of his  pain.   DICTATION ENDS HERE   Ines Bloomer, M.D.  Electronically Signed   DPB/MEDQ  D:  07/10/2008  T:  07/11/2008  Job:  [id number]

## 2010-10-20 NOTE — H&P (Signed)
NAMEORLIN, Benjamin               ACCOUNT NO.:  [id number]   MEDICAL RECORD NO.:  [id number]          PATIENT TYPE:  OIB   LOCATION:  3312                         FACILITY:  [facility]   PHYSICIAN:  Benjamin Case, M.D. DATE OF BIRTH:  Feb 20, 1956   DATE OF ADMISSION:  01/18/2008  DATE OF DISCHARGE:                              HISTORY & PHYSICAL   HISTORY:  Benjamin Case is a 55 year old gentleman who underwent a left  pneumonectomy for a non-small cell lung cancer approximately 10 days  prior to this admission.  On routine followup in the office 2 days ago,  the patient was found on chest x-ray to have findings consistent with  pneumopericardium.  Due to this, concern was raised about a possible  bronchial stump to pericardial fistula.  He was felt to require  admission for further evaluation to include bronchoscopy and possible  further intervention depending on clinical course and findings.   PAST MEDICAL HISTORY:  Includes hepatitis C treated with immunotherapy  several years ago.  He also had previous back surgery.  He also had ARDS  following his pneumonectomy.   ALLERGIES:  No known drug allergies.   MEDICATIONS ON ADMISSION:  1. Nexium 40 mg daily p.r.n.  2. Toprol-XL 25 mg daily.  3. Albuterol 2 puffs q.4 h. p.r.n.  4. Prednisone 1-1/2 tablets x4 days, then daily p.o.  The dosage is      not listed in the medication reconciliation.  He was also on      oxycodone-APAP 1-2 q.6 h. p.r.n. for pain.   FAMILY HISTORY:  Noncontributory.   SOCIAL HISTORY:  He smokes 1 pack of cigarettes per day prior to  pneumonectomy and uses 2-3 drinks of alcohol per day prior to his  pneumonectomy.   REVIEW OF SYMPTOMS:  He otherwise felt well.  He has no fevers, chills,  or cough.  He had no other significant symptoms.   PHYSICAL EXAM:  GENERAL:  Well-developed white male in no acute  distress.  VITAL SIGNS:  Blood pressure 121/74, pulse 64, respirations 20,  saturations were 90% on room  air.  HEENT:  Normocephalic, atraumatic.  Pupils equal, round, reactive to  light.  Extraocular movements intact.  Oral mucosa was moist without  exudates or erythema.  NECK:  Supple without thyromegaly.  No supraclavicular adenopathy.  No  carotid bruits.  PULMONARY:  Clear to auscultation.  CARDIAC:  Regular rate and rhythm.  No murmurs, gallops, or rubs.  ABDOMINAL:  Soft, nontender, nondistended.  SKIN:  No rashes or lesions.  No subcutaneous air.  EXTREMITIES:  Pulses are intact bilaterally.  No clubbing, cyanosis, or  edema.  NEUROLOGIC:  Alert and oriented x3.  No focal findings.   ASSESSMENT:  Pneumopericardium following left pneumonectomy with plan  outlined as above for bronchoscopy and further evaluation as clinical  course and results are determined.      Benjamin Case, P.A.-C.      Benjamin Case, M.D.  Electronically Signed    WEG/MEDQ  D:  01/19/2008  T:  01/20/2008  Job:  [id number]

## 2010-10-20 NOTE — Letter (Signed)
February 07, 2008   Barbaraann Share, MD, [facility]  [address] [place], [place] [address]   Re:  EULIS, SALAZAR               DOB:  09-02-1955   Dear Mellody Dance,   I saw the patient back today.  His chest x-ray looks good.  He has no  evidence of his pneumopericardium in which we had to do a subxiphoid  pericardial window approximately 2 weeks ago.  He looks great.  His left  chest is completely opacified and has no coughing.  His blood pressure  is 135/92, pulse 82, respirations 18, and sats were 95%.  Lungs were  clear to auscultation and percussion.  I think, he is doing well.  I  will have him see Dr. Shirline Frees because he will need to have chemotherapy  and I am going to send him to see you.  He probably would benefit from  at least a pneumonia if not a flu shot.   Ines Bloomer, M.D.  Electronically Signed   DPB/MEDQ  D:  02/07/2008  T:  02/08/2008  Job:  [id number]   cc:   Lajuana Matte, MD

## 2010-10-20 NOTE — Op Note (Signed)
[name], [name]               ACCOUNT NO.:  [id number]   MEDICAL RECORD NO.:  [id number]          PATIENT TYPE:  OIB   LOCATION:  2001                         FACILITY:  [facility]   PHYSICIAN:  Ines Bloomer, M.D. DATE OF BIRTH:  [birth year]   DATE OF PROCEDURE:  DATE OF DISCHARGE:                               OPERATIVE REPORT   PREOPERATIVE DIAGNOSES:  1. Status post pneumonectomy.  2. Pneumopericardium.   POSTOPERATIVE DIAGNOSES:  1. Status post pneumonectomy.  2. Pneumopericardium.   OPERATION PERFORMED:  Video bronchoscopy.   Approximately 10 days ago, the patient had a left pneumonectomy for non-  small-cell lung cancer.  Postoperatively, he developed ARDS and was  started on high-dose steroids per Pulmonology.  He came to the office  yesterday doing well, but a chest x-ray showed air in his pericardium.  There was no air in his left chest.  Because of this, we suspect that he  may have a leak in his bronchial stump, so we brought him to the  operating room for evaluation.  After local anesthesia with Cetacaine,  Xylocaine, and IV sedation, the video bronchoscope was passed through  the mouth.  Vocal cords were in the midline.  The trachea was normal.  The carina was in the midline.  The right upper lobe, right mid lobe,  and right lower lobe orifices were normal.  The left mainstem bronchus  was normal.  I did not see any evidence of any tear along the left  mainstem bronchus, and the bronchial stump appeared to be healing well.  There was one 2- to 3-mm area of granulation in the middle, but the rest  appeared to be completely normal.  We probed all of these areas,  including the area of the granulations, and could not find any area of  weakness.  The video bronchoscope was removed.  Cultures were sent.  The  plan was to admit him to hospital for observation and repeat his chest x-  ray and blood count, which was normal.      Ines Bloomer, M.D.  Electronically Signed     DPB/MEDQ  D:  01/18/2008  T:  01/19/2008  Job:  [id number]

## 2010-10-20 NOTE — Assessment & Plan Note (Signed)
OFFICE VISIT   YATES, WEISGERBER R  DOB:  09/09/1955                                        December 25, 2008  CHART #:  [id number]   The patient returns today.  On his last CT scan, there was a 3-mm  nodule, which will be reevaluated in September.  Otherwise, there is no  evidence of recurrence of his cancer.  His blood pressure is 134/84,  pulse 91, respirations 18, and saturations were 98%.  I will see him  back again in September after his next CT scan.   Ines Bloomer, M.D.  Electronically Signed   DPB/MEDQ  D:  12/25/2008  T:  12/26/2008  Job:  [id number]

## 2010-10-20 NOTE — Discharge Summary (Signed)
Benjamin Case, Benjamin Case               ACCOUNT NO.:  [id number]   MEDICAL RECORD NO.:  [id number]          PATIENT TYPE:  INP   LOCATION:  3312                         FACILITY:  [facility]   PHYSICIAN:  Ines Bloomer, M.D. DATE OF BIRTH:  1955-07-05   DATE OF ADMISSION:  01/18/2008  DATE OF DISCHARGE:  01/23/2008                               DISCHARGE SUMMARY   HISTORY:  Benjamin Case is a 54 year old gentleman who underwent a left  pneumonectomy for a non-small cell lung cancer approximately 10 days  prior to this admission.  On routine office followup, he was noted on  chest x-ray to have findings consistent with pneumopericardium.  Due to  this, concern was raised about a possible bronchial stump to pericardial  fistula.  He was felt to require admission for further evaluation to  include bronchoscopy and possible further intervention depending on the  clinical course and findings.   Past medical history includes lung cancer as described above, also  hepatitis C treated with immunotherapy several years ago.  He also had  previous back surgery.  He also had ARDS following his left  pneumonectomy.   ALLERGIES:  No known drug allergies.   MEDICATIONS PRIOR TO ADMISSION:  1. Nexium 40 mg daily p.r.n.  2. Toprol-XL 25 mg daily.  3. Albuterol 2 puffs q.4 hours p.r.n.  4. Prednisone 20 mg daily.  5. Oxycodone/APAP 1-2 q.6 hours as needed for pain.   Family history, social history, review of symptoms, and physical exam,  please see the history and physical done at the time of admission.   HOSPITAL COURSE:  The patient was admitted.  He was scheduled and  underwent fiberoptic bronchoscopy on January 18, 2008.  The findings were  essentially unremarkable.  The patient was followed also with serial  chest x-ray.  The air remained in the pericardium.  It was then felt on  January 19, 2008, that he should undergo a subxiphoid pericardial window  as well as placement of Tisseel in the left  bronchial stump.  This was  undertaken by Dr. Edwyna Shell, tolerated well, and he was taken to the  postanesthesia care unit in stable condition.  Postoperative hospital  course, the patient has continued to do well.  The chest x-ray has shown  no further air in the pericardium.  A Blake drain was placed during the  time of the procedure, and it was discontinued on January 22, 2008.  He  is tolerating routine activities, commensurate for postoperative  convalescence using standard protocols.  His incision is healing well  without evidence of infection.  He is tolerating diet.  Overall, it was  felt that he is tentatively stable for discharge in the morning of  January 23, 2008, pending morning round reevaluation.   DISCHARGE MEDICATIONS:  1. Nexium 40 mg daily.  2. Toprol-XL 25 mg daily.  3. Tylox 1 to 2 q.6 hours p.r.n.  4. Albuterol 2 puffs every 4 hours p.r.n.   FINAL DIAGNOSIS:  Pneumopericardium, following left pneumonectomy of  uncertain definitive etiology treated with the above-mentioned  procedures that included Tisseel in the  bronchial stump as well as a  pericardial window for relief of air.   OTHER DIAGNOSIS:  As per the history.      Benjamin Case, P.A.-C.      Ines Bloomer, M.D.  Electronically Signed    WEG/MEDQ  D:  01/22/2008  T:  01/23/2008  Job:  [id number]

## 2010-10-20 NOTE — Assessment & Plan Note (Signed)
OFFICE VISIT   Benjamin Case, Benjamin Case  DOB:  1956/02/23                                        October 02, 2008  CHART #:  [id number]   The patient returns today.  His CT scan showed stable left pneumonectomy  changes.  There is slightly more prominent size prevascular nodes, but  everything else looks fine.  We plan to see him back again in 3 months  with another CT scan.  His incision is well healed.   His blood pressure was 139/81, pulse 76, respirations 20, and sats were  98%.   Ines Bloomer, M.D.  Electronically Signed   DPB/MEDQ  D:  10/02/2008  T:  10/03/2008  Job:  [id number]

## 2010-10-20 NOTE — Assessment & Plan Note (Signed)
OFFICE VISIT   Benjamin Case, Benjamin Case R  DOB:  [birth year]                                        February 26, 2009  CHART #:  [id number]   The patient came for followup today.  CT scan shows this nodule is 4 mm,  which is stable.  He is doing well overall.  We will get another CT scan  in 6 months, and we will see him back again in February.  His blood  pressure was 146/86, pulse 82, respirations 18, sats were 98%.   Ines Bloomer, M.D.  Electronically Signed   DPB/MEDQ  D:  02/26/2009  T:  02/27/2009  Job:  [id number]

## 2010-10-20 NOTE — Discharge Summary (Signed)
Benjamin Case, Benjamin Case               ACCOUNT NO.:  [id number]   MEDICAL RECORD NO.:  [id number]          PATIENT TYPE:  INP   LOCATION:  3308                         FACILITY:  [facility]   PHYSICIAN:  Ines Bloomer, M.D. DATE OF BIRTH:  04-26-1956   DATE OF ADMISSION:  12/28/2007  DATE OF DISCHARGE:  01/08/2008                               DISCHARGE SUMMARY   HISTORY:  The patient is a 55 year old male referred to Dr. Edwyna Shell due  to a left lung mass.  The patient initially presented with an episode of  hemoptysis and workup by Dr. Shelle Iron revealed this left hilar mass.  Bronchoscopy revealed squamous cell cancer, the takeoff of the left  upper lobe.  PET scan was positive in this area.  The CT scan showed the  mass to be right next the left pulmonary artery.  Pulmonary function  tests were felt to be acceptable for proceeding with left pneumonectomy.  He was admitted this hospitalization for the procedure.   PAST MEDICAL HISTORY:  Also, remarkable for hepatitis C, treated several  years ago with immunotherapy.   PAST SURGICAL HISTORY:  He has had previous back surgery, no specifics  on the history and physical in this regard.   ALLERGIES:  No known allergies.   MEDICATIONS PRIOR TO ADMISSION:  Nexium 40 mg daily.   FAMILY HISTORY/SOCIAL HISTORY/REVIEW OF SYMPTOMS/PHYSICAL EXAM:  Please  see the history and physical done at the time of admission.   HOSPITAL COURSE:  The patient was admitted electively on December 28, 2007,  was taken to the operating room and underwent the following procedure  left video-assisted thoracoscopy with left thoracotomy, left  pneumonectomy with lymph node dissection, and he also had a bronchoscopy  and mediastinoscopy.  Initial findings confirmed squamous cell  carcinoma.  He was taken to the surgical intensive care unit in stable  condition.   POSTOPERATIVE HOSPITAL COURSE:  The patient had significant difficulties  postoperatively with pulmonary status  and acute respiratory failure due  to exacerbation of his COPD and presentation of new acute bilateral  pulmonary infiltrates.  He was treated with antibiotics as well as  steroids, and was managed by the Critical Care Pulmonology Service.  He  did not require reintubation.  He made a very gradual with continued  ongoing improvement in his pulmonary status.  All routine lines,  monitors, and drainage devices were discontinued in the standard  fashion.  Incision was felt to be healing without evidence of infection.  He slowly increased his activity using standard protocols with  increasing tolerance.  He did desat with exertion and it was ultimately  felt that he would require home oxygen.  Overall, his status was deemed  to be acceptable for discharge on January 08, 2008.   MEDICATIONS AT DISCHARGE:  1. Nexium 40 mg daily.  2. Toprol-XL 25 mg daily for pain.  3. Tylox 1-2 every 6 hours as needed.  4. Also, albuterol metered-dose inhaler 2 puffs every 4 hours p.r.n.   INSTRUCTIONS:  The patient received written instructions regarding  medications, activity, diet, wound care, and followup.  Follow up will  include Dr. Edwyna Shell 1 week post discharge.  Additionally, see Dr. Shelle Iron  in 2-3 weeks post discharge.   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  The tumor showed endobronchial growth, focally  associated with squamous carcinoma in situ.  There was high-grade  squamous dysplasia involving the bronchial margin of the resection, but  no invasive carcinoma is identified in the bronchial margin.  There was  1 hilar lymph node involved with metastatic squamous cell carcinoma at  level 5.   OTHER DIAGNOSES:  1. Postoperative acute respiratory insufficiency.  2. History of tobacco abuse.  3. History of viral hepatitis C.  4. History of esophageal reflux.  5. History of chronic obstructive pulmonary disease.   CONDITION ON  DISCHARGE:  Stable and improved.      Rowe Clack, P.A.-C.      Ines Bloomer, M.D.  Electronically Signed    WEG/MEDQ  D:  02/20/2008  T:  02/21/2008  Job:  [id number]   cc:   [facility]

## 2010-10-20 NOTE — Assessment & Plan Note (Signed)
OFFICE VISIT   Benjamin, ZIEGLER Case  DOB:  04-28-1956                                        November 05, 2009  CHART #:  [id number]   The patient came for follow up today.  His blood pressure was 130/81,  pulse 68, respirations 18, sats were 98%.  His CT scan showed no  evidence of recurrence now approximately 2 years since his surgery.  His  next CT scan is scheduled in 6 months.  I will plan to see him back  again.  I will be happy to see him again if he has any future problems.  I will let you follow him from now on with CT scans.   Ines Bloomer, M.D.  Electronically Signed   DPB/MEDQ  D:  11/05/2009  T:  11/06/2009  Job:  [id number]

## 2010-10-20 NOTE — Letter (Signed)
January 17, 2008   Barbaraann Share, MD, [facility]  [address] [place], [place] [address]   Re:  EDREES, Benjamin Case               DOB:  1956-05-11   Dear Mellody Dance,   I saw the patient in the office today for his first postoperative visit.  His blood pressure was 121/74, pulse 64, respirations 20, and sats were  88 to 90%.  As you know, he developed ARDS after his pneumonectomy, but  this resolved with steroids.  He came in today, and he is feeling well.  He has had some moderate pain, and we gave him a refill for his pain  medication.  He has had no fever, chills, or cough.  However, chest x-  ray today showed an unusual finding of some pneumopericardium.  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  There is no air in the left chest.  For this reason, we are going to  bring him into the hospital for bronchoscopy tomorrow and also check  blood work, CBC, and CMP on him.  I will let you know what our findings  are.   Sincerely,   Ines Bloomer, M.D.  Electronically Signed   DPB/MEDQ  D:  01/17/2008  T:  01/18/2008  Job:  [id number]

## 2010-10-20 NOTE — H&P (Signed)
Benjamin Case, IM               ACCOUNT NO.:  [id number]   MEDICAL RECORD NO.:  [id number]          PATIENT TYPE:  INP   LOCATION:  NA                           FACILITY:  [facility]   PHYSICIAN:  Ines Bloomer, M.D. DATE OF BIRTH:  03-29-1956   DATE OF ADMISSION:  DATE OF DISCHARGE:                              HISTORY & PHYSICAL   CHIEF COMPLAINT:  Left lung mass.   HISTORY OF PRESENT ILLNESS:  This 55 year old Caucasian male has a  long history of smoking and had an episode of hemoptysis and on a workup  was found to have a left hilar mass.  A left bronchoscopy by Dr. Shelle Iron  revealed squamous cell cancer takeoff with left upper lobe.  PET scan  was positive in this area.  CT scan showed the mass right next to the  left pulmonary artery.  Pulmonary function tests showed an FVC of 0.07  with an FEV1 of 2.98 and a diffusion capacity of 125%, which is greater  than normal.  The patient has recently quit smoking, but again has long  history of smoking.   PAST MEDICAL HISTORY:  Significant for hepatitis C, treated with  immunotherapy several years ago.  He has had previous back surgery.   ALLERGIES:  He has no allergies.   MEDICATIONS:  Nexium 40 mg a day.   FAMILY HISTORY:  Noncontributory.   SOCIAL HISTORY:  He is married, works as a Geneticist, molecular.  He smokes  1 pack a day and has 2-3 drinks per day.   REVIEW OF SYSTEMS:  VITAL SIGNS:  He is 190 pounds.  He is 5 feet 10  inches.  His weight has been stable.  CARDIAC:  No angina or atrial  fibrillation.  PULMONARY:  See history of present illness.  No asthma or  wheezing.  GI:  He has some reflux.  See past medical history.  GU:  No  kidney disease, dysuria, or frequent urination.  VASCULAR:  No  claudication, DVT, TIAs.  NEUROLOGICAL:  No dizziness, headaches,  blackouts, or seizures.  MUSCULOSKELETAL:  No arthritis or joint pain.  PSYCHIATRIC:  No depression or nervous.  EYES - ENT:  No changes in  eyesight or  hearing.  HEMATOLOGIC:  No problems with bleeding or  clotting disorders or anemia.   PHYSICAL EXAMINATION:  GENERAL:  He is a well-developed Caucasian male,  in no acute distress.  VITAL SIGNS:  His blood pressure is 147/89, pulse 74, respirations 18,  and sats were 95%.  HEENT:  Head is atraumatic.  Eyes, pupils equal reactive to light and  accommodation.  Extraocular movements are normal.  Ears, tympanic  membranes are intact.  Nose, there is no septal deviation.  Throat is  without lesion.  NECK:  Supple without thyromegaly.  No supraclavicular axillary  adenopathy.  CHEST:  Clear to auscultation and percussion.  HEART:  Regular sinus rhythm.  No murmurs.  ABDOMEN:  Soft.  There is no hepatosplenomegaly.  EXTREMITIES:  Pulses are 2+.  There is no clubbing or edema.  NEUROLOGICAL:  He is oriented x3.  Sensory  and motor intact.  Cranial  nerves intact.   IMPRESSION:  1. Nonsmall cell lung cancer, left upper lobe.  2. History of hepatitis C.   PLAN:  Left VATS, left thoracotomy, possible left pneumonectomy, and  mediastinoscopy.      Ines Bloomer, M.D.  Electronically Signed     DPB/MEDQ  D:  12/26/2007  T:  12/27/2007  Job:  [id number]

## 2010-10-20 NOTE — Assessment & Plan Note (Signed)
OFFICE VISIT   Benjamin Case, Benjamin Case  DOB:  [birth year]                                        April 02, 2008  CHART #:  [id number]   The patient's blood pressure was 141/91, pulse 87, respirations 18, and  sats were 96%.  He received his third cycle of chemotherapy and is  tolerating it well.  His chest x-ray with completed calcification of his  left chest.  No recurrence of his pneumopericardium.  His right lung was  clear to auscultation and percussion.  He is still working despite the  chemotherapy.  He is doing reasonably well overall.  I will plan to see  him back in 3 months for the chest x-ray.  Also, he has had a CT scan  done at that time by Dr. Arbutus Ped.   Ines Bloomer, M.D.  Electronically Signed   DPB/MEDQ  D:  04/02/2008  T:  04/02/2008  Job:  [id number]

## 2010-10-20 NOTE — Op Note (Signed)
Benjamin Case, Benjamin Case               ACCOUNT NO.:  [id number]   MEDICAL RECORD NO.:  [id number]          PATIENT TYPE:  INP   LOCATION:  2310                         FACILITY:  [facility]   PHYSICIAN:  Ines Bloomer, M.D. DATE OF BIRTH:  1956/01/15   DATE OF PROCEDURE:  12/28/2007  DATE OF DISCHARGE:                               OPERATIVE REPORT   PREOPERATIVE DIAGNOSIS:  Squamous cell cancer, left upper lobe.   POSTOPERATIVE DIAGNOSIS:  Squamous cell cancer, left upper lobe.   OPERATION:  Fiberoptic bronchoscopy, mediastinoscopy, left VATS, left  thoracotomy, attempted left upper lobectomy, and left pneumonectomy.   SURGEON:  Ines Bloomer, MD.   ANESTHESIA:  General.   FIRST ASSISTANT:  Doree Fudge, PA-C   After percutaneous insertion of all monitoring lines, the patient  underwent general anesthesia and was prepped and draped in the usual  sterile manner.  He was turned to the left lateral thoracotomy position  and a dual-lumen tube was inserted.  The left lung was deflated.  Trocar  site was made in the anterior axillary line in the seventh intercostal  space.  A 0-degree scope was inserted and we saw no intrapulmonary mass,  so then we proceeded with a posterolateral thoracotomy, dividing the  latissimus and reflecting the serratus anteriorly.  We entered the fifth  intercostal space and took down the intercostal muscle as an intercostal  muscle flap with electrocautery.  We divided it off over the top of the  sixth rib and then taking out the underneath portion of the fifth rib.  We then took out a piece of the fifth rib at the angle with Bethune rib  shears.  Two Tuffier's  were placed at right angles and exploration was  carried down.  The patient had a large cancer at the takeoff of the  right upper lobe when we did the right upper lobe.  Prior to doing the  thoracotomy, we had done a bronchoscopy through a single lumen before  switching to the dual lumen.  On  the bronchoscopy, the carina was in the  midline.  The right mainstem, right upper lobe, and right lower lobe  orifices were normal.  The left mainstem and left lower lobe orifices  were normal, but we could see the cancer about 3-4 mm from the orifice  of the left upper lobe.  After we had done the bronchoscopy, we then  proceeded with a mediastinoscopy, in which we had the patient supine,  prepped and draped the anterior neck, and made a transverse incision  over the sternal notch and carried down with electrocautery through the  subcutaneous tissue and fascia and entered the pretracheal fascia with a  video-mediastinoscope and biopsied the 4R node.  No other nodes could be  seen.  The video-mediastinoscope was removed.  Strap muscles were closed  with 2-0 Vicryl and Dermabond for the skin.  After the patient was  placed in the lateral thoracotomy position and left thoracotomy had been  made, we then put, as mentioned, put two Tuffier's  at right angles and  started the dissection.  The  inferior pulmonary ligament was taken down  encountering several nodes, 5 nodes at the aortopulmonary window and  these were dissected free.  The pulmonary artery was then looped with a  vascular tape.  We then dissected up the superior pulmonary vein, looped  it with a vascular tape, it was very close to the cancer, but we were  able to divide the superior pulmonary vein proximal to its involvement  with the cancer.  We still thought we might be able to do a left upper  lobectomy, so we then started dissection of the fissure partially  dividing the fissure with the Autosuture, green staplers, and the other  suture blue 30 staplers.  After the fissure had been divided, we divided  the lingular artery with the Autosuture 30 gray stapler.  We then  dissected up the apical posterior branch of the left upper lobe.  On CT  scan, this looked like this had been involved with cancer.  We were able  to dissect it  up and got a stapler across it and divided it.  After we  divided it, it was obvious that although  we were able to divide that,  right inferior to that, the cancer was intimately involved with the  descending pulmonary artery and did not think that we could separate it  from the pulmonary artery.  Also, it looked like it was involving the  left upper lobe bronchus and such that the only way to get margins was  to do a left pneumonectomy, which we had explained to the patient first  that it might have to be done, so we clamped the pulmonary artery with  Satinsky clamp, then divided it and clamped it distally with a ductus  clamp, and then divided it with the Autosuture 45 gray Roticulator and  then we dissected out the inferior pulmonary vein dissecting out several  9L and 10L nodes and dividing it with the Autosuture stapler.  Bronchus  was dissected up dissecting out some 4L nodes as well as some more 10L  nodes.  We had previously dissected out some 11L nodes from where we  were doing the dissection in the lobe and fissure.  We then stapled the  mainstem bronchus with TX-55 stapler and divided the bronchus distally.  We sent that for frozen sections and there was adequate margin, although  I did see some atypia cells.  The cancer was based as squamous cell  cancer.  On gross examination, you could see the cancer growing into the  pulmonary artery as well as around the left upper lobe bronchus since we  could not get adequate margins without a pneumonectomy.  The pulmonary  artery closure was reinforced with horizontal mattress of 4-0 Prolene  sutures.  We then put the intercostal muscle flap down onto the mainstem  bronchus and sutured it in place with 3-0 silk.  I brought a chest tube  in through the trocar site and tied it in place with 0 silk.  I then  closed the chest with 4 pericostal drilling through the sixth rib passed  around the fifth, #1 Vicryl in the muscle layer, 2-0 Vicryl  in the  subcutaneous tissue, and Ethicon skin clips.  The patient was returned  to the recovery room in serious condition.       Ines Bloomer, M.D.  Electronically Signed     DPB/MEDQ  D:  12/28/2007  T:  12/29/2007  Job:  [id number]   cc:  Barbaraann Share, MD,FCCP

## 2010-10-20 NOTE — Assessment & Plan Note (Signed)
OFFICE VISIT   Benjamin Case, Benjamin Case  DOB:  07/28/1955                                        July 18, 2009  CHART #:  [id number]   The patient came today.  He is now 18 months since his surgery.  His  blood pressure was 160/100, pulse 100, respirations 18, sats were 96%.  His 67-month CT scan showed no evidence of recurrence of his cancer.  As  mentioned he is about 18 months since his surgery.  He has another one  scheduled in May and we will see him back in June.  He is doing well  overall.  His left chest shows normal post pneumonectomy changes.   Ines Bloomer, M.D.  Electronically Signed   DPB/MEDQ  D:  07/18/2009  T:  07/19/2009  Job:  [id number]

## 2010-10-20 NOTE — Assessment & Plan Note (Signed)
OFFICE VISIT   Benjamin Case, Benjamin Case  DOB:  01/12/1956                                        February 28, 2008  CHART #:  [id number]   The patient came today and his chest x-ray showed complete opacification  of his left chest and no more pneumomediastinum.  His incisions are well  healed.  His blood pressure is 127/83, pulse 67, respirations 18, and  sats were 96%.  I released him to return back to work.  He started his  chemotherapy.  I will see him again in 3 weeks with another chest x-ray.   Ines Bloomer, M.D.  Electronically Signed   DPB/MEDQ  D:  02/28/2008  T:  02/29/2008  Job:  [id number]

## 2010-10-20 NOTE — Letter (Signed)
December 19, 2007   Barbaraann Share, MD, [facility]  [address]  [place]  [place] [address]   Re:  Benjamin Case, Benjamin Case               DOB:  17-Mar-1956   Dear Mellody Dance,   I appreciate the opportunity to see the patient.  He has a interesting  medical history.  He has a long history of smoking, but has an episode  of hemoptysis, which was worked up with a chest x-ray.  Because of  continued hemoptysis, CT scan was obtained which showed a left hilar  mass and bronchoscopy was done that showed squamous cell carcinoma at  the takeoff of the left upper lobe.  A PET scan was also positive in  this area.  Pulmonary function test showed an FEV of 4.07 with an FEV1  of 2.98 and diffusion capacity of 32%, greater than normal.  The CT scan  shows that this mass is involving the left main femoral artery.  The  patient currently quit smoking.   PAST MEDICAL HISTORY:  He had a hepatitis C, treated with immunologic  therapy several years ago and has had previous back surgery, he does not  remember the doctor's name, in the lumbar area.   MEDICATIONS:  Nexium 40 mg a day.   ALLERGIES:  He has no known allergies.   FAMILY HISTORY:  Noncontributory.   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  He smokes 1 pack a day and he has 2 or 3 drinks per day.   REVIEW OF SYSTEMS:  Vital Signs:  He weighs 190 pounds, he is 5 feet 10  inches.  His weight has been stable.  Cardiac:  He has had no angina or  atrial fibrillation.  Pulmonary:  He has hemoptysis and shortness of  breath with exertion.  No wheezing or asthma.  GI:  He has had reflux.  GU:  No kidney disease, dysuria, or frequent urination.  Vascular:  No  claudication, DVT, or TIAs.  Neurologic:  No dizziness, headache,  blackout, or seizures.  Musculoskeletal:  No arthritis or joint pain.  Psychiatric:  No depression or nervousness.  HEENT:  No changes in  eyesight or hearing.  Hematologic:  No problems with bleeding,  clotting  disorders, or anemia.   PHYSICAL EXAMINATION:  GENERAL:  He is a well-developed Caucasian male  in no acute distress.  VITAL SIGNS:  Blood pressure was 147/89, pulse 74, respirations 18, and  sats were 95%.  HEAD, EYES, EARS, NOSE, AND THROAT:  Unremarkable.  NECK:  Supple without thyromegaly.  There is no supraclavicular or  axillary adenopathy.  CHEST:  Clear to auscultation and percussion.  HEART:  Regular sinus rhythm.  No murmurs.  ABDOMEN:  Soft without hepatosplenomegaly.  EXTREMITIES:  Pulses are 2+.  There is no clubbing or edema.  NEUROLOGIC:  Oriented x3.  Sensory and motor intact.  Cranial nerves  intact.   Unfortunately, the patient is going to require a left pneumonectomy.  There is a possibility that we can get by with a sleeve resection of the  left upper lobe, but I doubt that very much.  I do not feel that  radiation or chemotherapy would improve the area itself.  I am planning  to, on January 01, 2008, do a bronchoscopy and mediastinoscopy just to make  sure there is no mediastinum involvement given that he may require  pneumonectomy, and  then at the same time if that is negative then  proceed with a left VATS thoracotomy with the idea of doing a left  pneumonectomy.   I appreciate the opportunity of seeing the patient.   Ines Bloomer, M.D.  Electronically Signed   DPB/MEDQ  D:  12/19/2007  T:  12/20/2007  Job:  [id number]   cc:   Emeterio Reeve, MD

## 2010-10-20 NOTE — Op Note (Signed)
[name], Benjamin Case               ACCOUNT NO.:  [id number]   MEDICAL RECORD NO.:  [id number]          PATIENT TYPE:  AMB   LOCATION:  CARD                         [facility]AC[facility]  [facility]   PHYSICIAN:  Barbaraann Share, MD,FCCPDATE OF BIRTH:  July 07, 1955   DATE OF PROCEDURE:  11/23/2007  DATE OF DISCHARGE:                               OPERATIVE REPORT   PROCEDURE:  Flexible fiberoptic bronchoscopy with biopsy.   INDICATIONS FOR PROCEDURE:  Abnormal CT with lung mass.   SURGEON:  Barbaraann Share, MD,FCCP   ANESTHESIA:  Versed 40 mg IV in various aliquots, fentanyl 200 mcg IV in  various aliquots, and Demerol 100  mg IV.  Also topical 1% lidocaine to  vocal cords and airways during the procedure.   DESCRIPTION OF PROCEDURE:  After obtaining informed consent and under  close cardiopulmonary monitoring, the above preop anesthesia was given  and the fiberoptic scope was passed through the right nare and into the  posterior pharynx where there were no lesions or other abnormalities  seen.  The vocal cords appeared to be within normal limits and moved  bilaterally in phonation.  The scope was then passed into the trachea  where it was examined along its entire length down to the level of the  carina.  All of this was normal.  The right tracheal bronchial tree was  examined serially to the subsegmental level with no endobronchial  abnormality being found.  There was some anatomical distortion to the  right upper lobe trifurcation but no mucosal changes or endobronchial  abnormality.  Attention was then paid to the left tracheal bronchial  tree where the left lower lobe was normal.  The scope was then passed  into the left upper lobe where the lingula was without abnormality,  however, the segment to the upper division was very abnormal with  cobblestoning of the mucosa and very friable and erythematous.  There  was narrowing of the apical posterior segment to the left upper lobe.  Biopsies,  brushing, bronchoalveolar lavage and one needle aspiration  were all done from the apical posterior segment of the left upper lobe.  Good specimens were obtained.  Overall the patient tolerated the  procedure well and there were no complications.      Barbaraann Share, MD,FCCP  Electronically Signed     [facility]/MEDQ  D:  11/23/2007  T:  11/23/2007  Job:  [id number]

## 2010-10-22 ENCOUNTER — Other Ambulatory Visit: Payer: Self-pay | Admitting: Internal Medicine

## 2010-10-22 ENCOUNTER — Encounter (HOSPITAL_BASED_OUTPATIENT_CLINIC_OR_DEPARTMENT_OTHER): Payer: 59 | Admitting: Internal Medicine

## 2010-10-22 DIAGNOSIS — C3490 Malignant neoplasm of unspecified part of unspecified bronchus or lung: Secondary | ICD-10-CM

## 2010-10-22 DIAGNOSIS — C3410 Malignant neoplasm of upper lobe, unspecified bronchus or lung: Secondary | ICD-10-CM

## 2010-10-27 NOTE — Op Note (Signed)
Benjamin Case, Benjamin Case               ACCOUNT NO.:  [id number]   MEDICAL RECORD NO.:  [id number]          PATIENT TYPE:  AMB   LOCATION:  SDS                          FACILITY:  [facility]   PHYSICIAN:  Tennis Must Meyerdierks, M.D.DATE OF BIRTH:  [birth year]   DATE OF PROCEDURE:  07/08/2005  DATE OF DISCHARGE:  07/08/2005                                 OPERATIVE REPORT   PREOPERATIVE DIAGNOSIS:  Crush injury, left hand, with compartment syndrome  and acute carpal tunnel syndrome with open carpal metacarpal joints second  through fourth digits.   PROCEDURE:  Decompression median nerve, left carpal tunnel with  decompression of interosseous muscles, left hand and irrigation of  carpometacarpal joints 2-4.   SURGEON:  Lowell Bouton, M.D.   ANESTHESIA:  General.   OPERATIVE FINDINGS:  The patient had a cinder block fall on his left hand.  He had a laceration across the dorsum of the hand that was closed at an  Urgent Care yesterday.  His interosseous muscles were edematous, and the  compartments were quite tight.  The median nerve was very edematous, and  there was significant swelling in the carpal canal.  The CMC joints were  exposed in the dorsal wound.  There was a small avulsion fracture at the  base of the second metacarpal.   DESCRIPTION OF PROCEDURE:  Under general anesthesia with a tourniquet on the  left arm, the left hand was prepped and draped in the usual fashion and  after exsanguinating the limb, the tourniquet was inflated to 250 mmHg.  A  longitudinal incision was made in the palm just ulnar to the thenar crease  and extended across the wrist ulnarly.  Sharp dissection was carried through  the subcutaneous tissues and bleeding points were coagulated.  Blunt  dissection was carried through superficial palmar fascia distal to the  transverse carpal ligament.  A hemostat was then placed in the carpal canal  up against the hook of the hamate and the transverse  carpal ligament was  divided on the ulnar border of the median nerve.  The proximal end of the  ligament was divided with the scissors, and the forearm fascia distally was  divided with the scissors after protecting the nerve.  The nerve was  examined and found to have an hour glass defect with significant narrowing  beneath the carpal tunnel.  The motor branch was intact.  The wound was then  irrigated, and 0.25% Marcaine was inserted in the skin edges for pain  control, and the skin was closed with 4-0 nylon sutures. The dorsal wound  was opened that had been previously sutured, and there was a hematoma in the  subcutaneous tissues.  The wound extended down to the [facility] joints of the  index, long and ring fingers, which were exposed.  They were irrigated out  copiously with saline.  The extensor tendons were all intact and were  retracted and the fasciotomy was performed of the interosseous muscles on  the thumb, index and the second, third and fourth web spaces.  The wound was  then irrigated copiously.  Bleeding was controlled with electrocautery.  The  subcutaneous tissue was closed with 4-0 Vicryl and the skin with a 4-0  nylon. Marcaine 0.5%  was inserted into the skin edges for pain control.  A vessel loop drain was  left in for drainage.  Sterile dressings were applied.  The patient was  placed in a dorsal splint in the intrinsic plus position.  He had the  tourniquet released with good circulation of the hand.  He went to the  recovery room awake, stable and in good condition.      Lowell Bouton, M.D.  Electronically Signed     EMM/MEDQ  D:  07/08/2005  T:  07/09/2005  Job:  [id number]

## 2010-10-27 NOTE — Op Note (Signed)
NAMEHANIF, RADIN               ACCOUNT NO.:  [id number]   MEDICAL RECORD NO.:  [id number]          PATIENT TYPE:  OIB   LOCATION:  1515                         FACILITY:  [facility]   PHYSICIAN:  Georges Lynch. Gioffre, M.D.DATE OF BIRTH:  05/04/1956   DATE OF PROCEDURE:  04/29/2006  DATE OF DISCHARGE:                                 OPERATIVE REPORT   SURGEON:  Georges Lynch. Darrelyn Hillock, M.D.   ASSISTANT:  Marlowe Kays, M.D.   PREOPERATIVE DIAGNOSES:  1. Herniated lumbar disk with caudal migration L5-S1 on the left.  2. Lateral recess stenosis at L4-5 on the left.  3. Foraminal herniated disk at L4-5 on the left.   POSTOPERATIVE DIAGNOSES:  1. Herniated lumbar disk with caudal migration L5-S1 on the left.  2. Lateral recess stenosis at L4-5 on the left.  3. Foraminal herniated disk at L4-5 on the left.   OPERATION:  1. Microdiskectomy and hemilaminectomy at L4-5 on the left.  2. Foraminotomy L4-5 on the left.  3. Decompression of the lateral recess L4-5 on the left.  4. Hemilaminectomy and microdiskectomy at L5-S1 on the left.  5. Foraminotomy for the S1 root on the left.   PROCEDURE:  Under general anesthesia, routine orthopedic prep and draping of  the lower back was carried out.  The patient was given 1 gram of IV Ancef  preop.  At this time two needles were placed in the back for localization  purposes.  X-ray was taken.  An incision was made over the L4-5 and L5-S1  interspace.  Bleeders were identified and cauterized.  The muscle was  stripped from the lamina on the left at L4-5 and at L5-S1.  Another x-ray  was taken with an instrument in the L5-S1 disk space.  Following that, we  did hemilaminectomies at L4-5 on the left and hemilaminectomies at L5-S1 on  the left.  We utilized the microscope, removed the ligamentum flavum from  both levels.  We then decompressed the lateral recess which was quite tight  at L4-5 on the left.  We did nice foraminotomies.  We identified the L5  root.  We went out and decompressed the root there and the root above by  going out into the foramina and doing a diskectomy.  We did do a complete  diskectomy utilizing the microscope.  We were now able to easily pass the  hockey sticks out the 4 and 5 foramina without any difficulty.  Thoroughly  irrigated out the area, loosely applied some Gelfoam.  Next, attention was  directed to the L5-S1 interspace.  We protected the dura at all times.  We  went out, decompressed the nerve of the S1 root.  We then went down into the  disk space made a cruciate incision in the usual fashion, did a  microdiskectomy utilizing the microscope.  Note, HE HAD LARGE SPURS over the  posterior L5-S1 space.  We did a nice foraminotomy for the S1 root.  We now  were able to easily move the S1 root as well as the 5 root above.  We  thoroughly irrigated out  the area and made sure we did complete diskectomy.  The nerve roots now were free.  The dura was free.  Good hemostasis was  maintained utilizing some loose pieces of thrombin-soaked Gelfoam.  Following that, I closed the wound in layers in the usual  fashion except I left the deep proximal part of the wound proximally left  open for drainage purposes.  Skin was closed with metal staples.  A sterile  Neosporin dressing was applied.  Prior to leaving the operating room.  He  had 30 mg of IV Toradol.           ______________________________  Georges Lynch. Darrelyn Hillock, M.D.     RAG/MEDQ  D:  04/29/2006  T:  04/29/2006  Job:  [id number]

## 2011-03-04 LAB — FUNGUS CULTURE W SMEAR: Fungal Smear: NONE SEEN

## 2011-03-04 LAB — CULTURE, RESPIRATORY W GRAM STAIN

## 2011-03-04 LAB — AFB CULTURE WITH SMEAR (NOT AT ARMC)

## 2011-03-05 LAB — BASIC METABOLIC PANEL
BUN: 21
BUN: 5 — ABNORMAL LOW
BUN: 25 — ABNORMAL HIGH
CO2: 27
Calcium: 8.1 — ABNORMAL LOW
Calcium: 8.7
Calcium: 8.7
Calcium: 8.5
Chloride: 100
Chloride: 90 — ABNORMAL LOW
Chloride: 91 — ABNORMAL LOW
Chloride: 95 — ABNORMAL LOW
Creatinine, Ser: 1.16
Creatinine, Ser: 1.22
Creatinine, Ser: 1.06
GFR calc Af Amer: >60
GFR calc Af Amer: >60
GFR calc Af Amer: >60
GFR calc non Af Amer: 50 — ABNORMAL LOW
GFR calc non Af Amer: >60
Glucose, Bld: 193 — ABNORMAL HIGH
Potassium: 3.8
Potassium: 4.1
Potassium: 4.1
Sodium: 129 — ABNORMAL LOW

## 2011-03-05 LAB — TYPE AND SCREEN
ABO/RH(D): O POS
ABO/RH(D): O POS
Antibody Screen: NEGATIVE
Antibody Screen: NEGATIVE

## 2011-03-05 LAB — COMPREHENSIVE METABOLIC PANEL
ALT: 147 — ABNORMAL HIGH
ALT: 34
ALT: 127 — ABNORMAL HIGH
ALT: 90 — ABNORMAL HIGH
AST: 32
AST: 62 — ABNORMAL HIGH
AST: 70 — ABNORMAL HIGH
AST: 52 — ABNORMAL HIGH
Albumin: 2.7 — ABNORMAL LOW
Albumin: 2.5 — ABNORMAL LOW
Albumin: 3 — ABNORMAL LOW
Alkaline Phosphatase: 101
Alkaline Phosphatase: 107
Alkaline Phosphatase: 61
Alkaline Phosphatase: 79
BUN: 42 — ABNORMAL HIGH
BUN: 8
BUN: 20
CO2: 22
CO2: 27
CO2: 31
CO2: 31
Calcium: 8.5
Calcium: 8.5
Calcium: 9.6
Calcium: 9
Chloride: 93 — ABNORMAL LOW
Chloride: 96
Chloride: 97
Chloride: 99
Creatinine, Ser: 1.12
Creatinine, Ser: 1.36
Creatinine, Ser: 1.44
Creatinine, Ser: 0.99
GFR calc Af Amer: >60
GFR calc Af Amer: >60
GFR calc Af Amer: >60
GFR calc Af Amer: >60
GFR calc non Af Amer: 55 — ABNORMAL LOW
GFR calc non Af Amer: >60
GFR calc non Af Amer: >60
Glucose, Bld: 134 — ABNORMAL HIGH
Glucose, Bld: 230 — ABNORMAL HIGH
Glucose, Bld: 97
Glucose, Bld: 119 — ABNORMAL HIGH
Potassium: 3.7
Potassium: 3.9
Potassium: 4.2
Potassium: 4.2
Sodium: 140
Sodium: 135
Sodium: 142
Total Bilirubin: 0.8
Total Bilirubin: 0.9
Total Bilirubin: 0.9
Total Bilirubin: 0.9
Total Protein: 6.2
Total Protein: 7

## 2011-03-05 LAB — BLOOD GAS, ARTERIAL
Acid-Base Excess: 0.9
Bicarbonate: 23.9
Bicarbonate: 30.1 — ABNORMAL HIGH
Drawn by: 206361
FIO2: 0.21
O2 Saturation: 90
TCO2: 27
pCO2 arterial: 36.9
pCO2 arterial: 46 — ABNORMAL HIGH
pCO2 arterial: 50.2 — ABNORMAL HIGH
pH, Arterial: 7.394
pH, Arterial: 7.427
pO2, Arterial: 45.9 — ABNORMAL LOW
pO2, Arterial: 56.8 — ABNORMAL LOW
pO2, Arterial: 78.6 — ABNORMAL LOW

## 2011-03-05 LAB — CULTURE, RESPIRATORY W GRAM STAIN

## 2011-03-05 LAB — FUNGUS CULTURE W SMEAR: Fungal Smear: NONE SEEN

## 2011-03-05 LAB — BODY FLUID CULTURE
Culture: NO GROWTH
Gram Stain: NONE SEEN

## 2011-03-05 LAB — POCT I-STAT 3, ART BLOOD GAS (G3+)
Bicarbonate: 32.5 — ABNORMAL HIGH
O2 Saturation: 84
O2 Saturation: 87
Operator id: 222511
TCO2: 34
pCO2 arterial: 45.3 — ABNORMAL HIGH
pCO2 arterial: 48.6 — ABNORMAL HIGH
pH, Arterial: 7.377
pO2, Arterial: 47 — ABNORMAL LOW

## 2011-03-05 LAB — CBC
HCT: 30.3 — ABNORMAL LOW
HCT: 33.5 — ABNORMAL LOW
HCT: 34.4 — ABNORMAL LOW
HCT: 37.8 — ABNORMAL LOW
HCT: 33.8 — ABNORMAL LOW
HCT: 34.4 — ABNORMAL LOW
Hemoglobin: 11.7 — ABNORMAL LOW
Hemoglobin: 15.5
Hemoglobin: 11.6 — ABNORMAL LOW
MCHC: 34
MCHC: 34.4
MCHC: 33.6
MCV: 101.2 — ABNORMAL HIGH
MCV: 98.2
MCV: 98.2
MCV: 99.6
MCV: 99.7
MCV: 99.8
MCV: 99.2
Platelets: 211
Platelets: 262
Platelets: 323
Platelets: 316
Platelets: 414 — ABNORMAL HIGH
Platelets: 460 — ABNORMAL HIGH
RBC: 3.09 — ABNORMAL LOW
RBC: 3.29 — ABNORMAL LOW
RBC: 3.55 — ABNORMAL LOW
RBC: 3.78 — ABNORMAL LOW
RBC: 4.52
RBC: 3.53 — ABNORMAL LOW
RDW: 14.6
RDW: 14.7
RDW: 14.9
WBC: 11.1 — ABNORMAL HIGH
WBC: 14.2 — ABNORMAL HIGH
WBC: 15.7 — ABNORMAL HIGH
WBC: 15.9 — ABNORMAL HIGH
WBC: 17.2 — ABNORMAL HIGH
WBC: 18.6 — ABNORMAL HIGH
WBC: 22 — ABNORMAL HIGH
WBC: 10.3
WBC: 22.4 — ABNORMAL HIGH
WBC: 22.6 — ABNORMAL HIGH
WBC: 9.3

## 2011-03-05 LAB — AFB CULTURE WITH SMEAR (NOT AT ARMC): Acid Fast Smear: NONE SEEN

## 2011-03-05 LAB — URINALYSIS, ROUTINE W REFLEX MICROSCOPIC
Bilirubin Urine: NEGATIVE
Leukocytes, UA: NEGATIVE
Nitrite: NEGATIVE
Specific Gravity, Urine: 1.017
Urobilinogen, UA: 0.2
pH: 6

## 2011-03-05 LAB — B-NATRIURETIC PEPTIDE (CONVERTED LAB)
Pro B Natriuretic peptide (BNP): 241 — ABNORMAL HIGH
Pro B Natriuretic peptide (BNP): 602 — ABNORMAL HIGH

## 2011-03-05 LAB — APTT: aPTT: 24

## 2011-03-05 LAB — ABO/RH: ABO/RH(D): O POS

## 2011-03-05 LAB — URINE MICROSCOPIC-ADD ON

## 2011-03-05 LAB — PROTIME-INR: Prothrombin Time: 12.8

## 2011-04-19 ENCOUNTER — Other Ambulatory Visit: Payer: Self-pay | Admitting: Internal Medicine

## 2011-04-19 ENCOUNTER — Ambulatory Visit (HOSPITAL_COMMUNITY)
Admission: RE | Admit: 2011-04-19 | Discharge: 2011-04-19 | Disposition: A | Discharge status: Home / Self Care | Payer: 59 | Source: Ambulatory Visit | Attending: Internal Medicine | Admitting: Internal Medicine

## 2011-04-19 ENCOUNTER — Other Ambulatory Visit (HOSPITAL_BASED_OUTPATIENT_CLINIC_OR_DEPARTMENT_OTHER): Payer: 59 | Admitting: Lab

## 2011-04-19 DIAGNOSIS — Z902 Acquired absence of lung [part of]: Secondary | ICD-10-CM | POA: Insufficient documentation

## 2011-04-19 DIAGNOSIS — E279 Disorder of adrenal gland, unspecified: Secondary | ICD-10-CM | POA: Insufficient documentation

## 2011-04-19 DIAGNOSIS — I2510 Atherosclerotic heart disease of native coronary artery without angina pectoris: Secondary | ICD-10-CM | POA: Insufficient documentation

## 2011-04-19 DIAGNOSIS — C3410 Malignant neoplasm of upper lobe, unspecified bronchus or lung: Secondary | ICD-10-CM

## 2011-04-19 DIAGNOSIS — N289 Disorder of kidney and ureter, unspecified: Secondary | ICD-10-CM | POA: Insufficient documentation

## 2011-04-19 DIAGNOSIS — C3490 Malignant neoplasm of unspecified part of unspecified bronchus or lung: Secondary | ICD-10-CM | POA: Insufficient documentation

## 2011-04-19 DIAGNOSIS — I2789 Other specified pulmonary heart diseases: Secondary | ICD-10-CM | POA: Insufficient documentation

## 2011-04-19 DIAGNOSIS — K7689 Other specified diseases of liver: Secondary | ICD-10-CM | POA: Insufficient documentation

## 2011-04-19 LAB — CBC WITH DIFFERENTIAL/PLATELET
BASO%: 0.4 % (ref 0.0–2.0)
Basophils Absolute: 0 10*3/uL (ref 0.0–0.1)
EOS%: 2.4 % (ref 0.0–7.0)
HGB: 14.9 g/dL (ref 13.0–17.1)
MCH: 36.1 pg — ABNORMAL HIGH (ref 27.2–33.4)
MCHC: 34.6 g/dL (ref 32.0–36.0)
RDW: 13.1 % (ref 11.0–14.6)
WBC: 9.9 10*3/uL (ref 4.0–10.3)
lymph#: 2.6 10*3/uL (ref 0.9–3.3)

## 2011-04-19 LAB — CMP (CANCER CENTER ONLY)
ALT(SGPT): 41 U/L (ref 10–47)
AST: 53 U/L — ABNORMAL HIGH (ref 11–38)
Albumin: 3.6 g/dL (ref 3.3–5.5)
Calcium: 8.9 mg/dL (ref 8.0–10.3)
Chloride: 96 mEq/L — ABNORMAL LOW (ref 98–108)
Potassium: 4.5 mEq/L (ref 3.3–4.7)

## 2011-04-19 MED ORDER — IOHEXOL 300 MG/ML  SOLN
80.0000 mL | Freq: Once | INTRAMUSCULAR | Status: AC | PRN
  Administered 2011-04-19: 80 mL via INTRAVENOUS

## 2011-04-22 ENCOUNTER — Ambulatory Visit (HOSPITAL_BASED_OUTPATIENT_CLINIC_OR_DEPARTMENT_OTHER): Payer: 59 | Admitting: Internal Medicine

## 2011-04-22 ENCOUNTER — Telehealth: Payer: Self-pay | Admitting: Internal Medicine

## 2011-04-22 VITALS — BP 132/80 | HR 74 | Temp 97.8°F | Ht 70.0 in | Wt 197.7 lb

## 2011-04-22 DIAGNOSIS — C3490 Malignant neoplasm of unspecified part of unspecified bronchus or lung: Secondary | ICD-10-CM

## 2011-04-22 DIAGNOSIS — C3410 Malignant neoplasm of upper lobe, unspecified bronchus or lung: Secondary | ICD-10-CM

## 2011-04-22 NOTE — Telephone Encounter (Signed)
gve the pt his may 2013 appt calendar along with the ct scan appt °

## 2011-04-22 NOTE — Progress Notes (Signed)
OFFICE PROGRESS NOTE  Benjamin Boga, MD [address] [place] [address]  DIAGNOSIS: Stage III (T2 N2 Mx) non-small-cell lung cancer, squamous cell carcinoma diagnosed in June of 2009.  PRIOR THERAPY:  1. Status post left pneumonectomy with lymph node dissection under the care of Dr. Edwyna Shell on 12/28/2007. 2. Status post 4 cycles of adjuvant chemotherapy with cisplatin and docetaxel, last dose was given 04/29/2008.  CURRENT THERAPY: Observation  INTERVAL HISTORY: Benjamin Case 55 y.o. male returns to the clinic today for his routine six-month followup visit. The patient is doing fine no specific complaints.He denied having any significant chest pain or shortness of breath, he denied having any cough or hemoptysis. No significant weight loss actually he gained around 8 pounds since his last visit. Unfortunately he continues to drink alcohol on daily basis. He has repeat CT scan of the chest performed on 04/19/2011 and the patient is here today for evaluation and discussion of his scan results.  MEDICAL HISTORY:No past medical history on file.  ALLERGIES:   has no known allergies.  MEDICATIONS:  Current Outpatient Prescriptions  Medication Sig Dispense Refill  . amLODipine (NORVASC) 5 MG tablet Take 5 mg by mouth daily.        Marland Kitchen esomeprazole (NEXIUM) 40 MG capsule Take 40 mg by mouth daily.        Marland Kitchen HYDROcodone-acetaminophen (NORCO) 7.5-325 MG per tablet Take 1 tablet by mouth every 6 (six) hours as needed. From Dr Johnn Hai At [place] [facility] [place]       . UNKNOWN TO PATIENT Sleeping medication, pt does not know name         SURGICAL HISTORY: No past surgical history on file.  REVIEW OF SYSTEMS:  A comprehensive review of systems was negative.   PHYSICAL EXAMINATION: General appearance: alert, cooperative and no distress Head: Normocephalic, without obvious abnormality, atraumatic Neck: no adenopathy Lymph nodes: Cervical, supraclavicular, and axillary  nodes normal. Resp: clear to auscultation bilaterally Cardio: regular rate and rhythm, S1, S2 normal, no murmur, click, rub or gallop GI: soft, non-tender; bowel sounds normal; no masses,  no organomegaly Extremities: extremities normal, atraumatic, no cyanosis or edema Neurologic: Alert and oriented X 3, normal strength and tone. Normal symmetric reflexes. Normal coordination and gait  ECOG PERFORMANCE STATUS: 1 - Symptomatic but completely ambulatory  Blood pressure 132/80, pulse 74, temperature 97.8 F (36.6 C), temperature source Oral, height 5\' 10"  (1.778 m), weight 197 lb 11.2 oz (89.676 kg).  LABORATORY DATA: Lab Results  Component Value Date   WBC 9.9 04/19/2011   HGB 14.9 04/19/2011   HCT 43.0 04/19/2011   MCV 104.3* 04/19/2011   PLT 350 04/19/2011      Chemistry      Component Value Date/Time   NA 143 04/19/2011 1044   NA 139 04/17/2010 1202   K 4.5 04/19/2011 1044   K 4.4 04/17/2010 1202   CL 96* 04/19/2011 1044   CL 102 04/17/2010 1202   CO2 30 04/19/2011 1044   CO2 29 04/17/2010 1202   BUN 13 04/19/2011 1044   BUN 11 04/17/2010 1202   CREATININE 1.2 04/19/2011 1044   CREATININE 1.43 04/17/2010 1202      Component Value Date/Time   CALCIUM 8.9 04/19/2011 1044   CALCIUM 9.3 04/17/2010 1202   ALKPHOS 58 04/19/2011 1044   ALKPHOS 49 04/17/2010 1202   AST 53* 04/19/2011 1044   AST 31 04/17/2010 1202   ALT 26 04/17/2010 1202   BILITOT 0.50 04/19/2011 1044  BILITOT 0.9 04/17/2010 1202       RADIOGRAPHIC STUDIES: Ct Chest W Contrast  04/19/2011  *RADIOLOGY REPORT*  Clinical Data: Lung cancer.  CT CHEST WITH CONTRAST  Technique:  Multidetector CT imaging of the chest was performed following the standard protocol during bolus administration of intravenous contrast.  Contrast: 80mL OMNIPAQUE IOHEXOL 300 MG/ML IV SOLN  Comparison: 10/16/2010.  Findings: Mediastinal lymph nodes are not enlarged by CT size criteria.  No hilar or axillary adenopathy.   Atherosclerotic calcification of the arterial vasculature, including coronary arteries.  Pulmonary arteries are enlarged.  Heart is at the upper limits of normal in size.  Small pre pericardiac lymph node.  No pericardial effusion.  Left pneumonectomy changes are stable.  Bullous changes are seen at the apex of the right hemithorax with additional centrilobular emphysema in the right lung.  Scattered tiny peribronchovascular nodules are stable.  Airway is otherwise unremarkable.  Incidental imaging of the upper abdomen shows an 11 mm low attenuation lesion in the right hepatic lobe, stable.  Left adrenal nodule measures 1.9 x 1.4 cm, stable.  A 1.2 cm low attenuation lesion in the interpolar left kidney is incompletely imaged.  No worrisome lytic or sclerotic lesions.  Thoracotomy changes on the left.  Old right rib fracture.  IMPRESSION:  1.  Left pneumonectomy without evidence of recurrent or metastatic disease. 2.  Coronary artery calcification and pulmonary arterial hypertension. 3.  Left adrenal nodule, previously characterized as an adenoma on 12/01/2007.  Original Report Authenticated By: Reyes Ivan, M.D.    ASSESSMENT: Ms. a very pleasant 55 years old white male with history of stage IIIA non-small cell lung cancer, squamous cell carcinoma status post left pneumonectomy followed by 4 cycles of adjuvant chemotherapy with cisplatin and docetaxel. The patient has been on observation since November of 2009 with no evidence for disease recurrence. I discussed the scan results with Benjamin Case.   PLAN: He will continue on observation. I would see him back for followup visit in 6 months with repeat CT scan of the chest with contrast.  All questions were answered. The patient knows to call the clinic with any problems, questions or concerns. We can certainly see the patient much sooner if necessary.

## 2011-06-30 DIAGNOSIS — Z0271 Encounter for disability determination: Secondary | ICD-10-CM

## 2011-10-12 ENCOUNTER — Other Ambulatory Visit: Payer: Self-pay | Admitting: Lab

## 2011-10-12 DIAGNOSIS — C3490 Malignant neoplasm of unspecified part of unspecified bronchus or lung: Secondary | ICD-10-CM

## 2011-10-20 ENCOUNTER — Other Ambulatory Visit (HOSPITAL_BASED_OUTPATIENT_CLINIC_OR_DEPARTMENT_OTHER): Payer: 59 | Admitting: Lab

## 2011-10-20 ENCOUNTER — Ambulatory Visit (HOSPITAL_COMMUNITY)
Admission: RE | Admit: 2011-10-20 | Discharge: 2011-10-20 | Disposition: A | Discharge status: Home / Self Care | Payer: 59 | Source: Ambulatory Visit | Attending: Internal Medicine | Admitting: Internal Medicine

## 2011-10-20 DIAGNOSIS — R0602 Shortness of breath: Secondary | ICD-10-CM | POA: Insufficient documentation

## 2011-10-20 DIAGNOSIS — C3490 Malignant neoplasm of unspecified part of unspecified bronchus or lung: Secondary | ICD-10-CM | POA: Insufficient documentation

## 2011-10-20 DIAGNOSIS — Z902 Acquired absence of lung [part of]: Secondary | ICD-10-CM | POA: Insufficient documentation

## 2011-10-20 LAB — CMP (CANCER CENTER ONLY)
AST: 43 U/L — ABNORMAL HIGH (ref 11–38)
Albumin: 4.3 g/dL (ref 3.3–5.5)
Alkaline Phosphatase: 55 U/L (ref 26–84)
BUN, Bld: 15 mg/dL (ref 7–22)
Potassium: 5 mEq/L — ABNORMAL HIGH (ref 3.3–4.7)
Sodium: 142 mEq/L (ref 128–145)
Total Protein: 7.7 g/dL (ref 6.4–8.1)

## 2011-10-20 LAB — CBC WITH DIFFERENTIAL/PLATELET
BASO%: 0.6 % (ref 0.0–2.0)
EOS%: 1.9 % (ref 0.0–7.0)
MCH: 35.8 pg — ABNORMAL HIGH (ref 27.2–33.4)
MCHC: 34 g/dL (ref 32.0–36.0)
RBC: 4.08 10*6/uL — ABNORMAL LOW (ref 4.20–5.82)
RDW: 13.1 % (ref 11.0–14.6)
lymph#: 2.3 10*3/uL (ref 0.9–3.3)

## 2011-10-20 MED ORDER — IOHEXOL 300 MG/ML  SOLN
100.0000 mL | Freq: Once | INTRAMUSCULAR | Status: AC | PRN
  Administered 2011-10-20: 100 mL via INTRAVENOUS

## 2011-10-25 ENCOUNTER — Ambulatory Visit (HOSPITAL_BASED_OUTPATIENT_CLINIC_OR_DEPARTMENT_OTHER): Payer: 59 | Admitting: Internal Medicine

## 2011-10-25 ENCOUNTER — Telehealth: Payer: Self-pay | Admitting: Internal Medicine

## 2011-10-25 VITALS — BP 126/78 | HR 74 | Temp 99.0°F | Ht 70.0 in | Wt 189.5 lb

## 2011-10-25 DIAGNOSIS — C3410 Malignant neoplasm of upper lobe, unspecified bronchus or lung: Secondary | ICD-10-CM

## 2011-10-25 DIAGNOSIS — R0602 Shortness of breath: Secondary | ICD-10-CM

## 2011-10-25 DIAGNOSIS — C3490 Malignant neoplasm of unspecified part of unspecified bronchus or lung: Secondary | ICD-10-CM

## 2011-10-25 NOTE — Telephone Encounter (Signed)
gv pt appt schedule for nov including ct 11/18.

## 2011-10-25 NOTE — Progress Notes (Signed)
[facility] [phone]   Fax:([phone]  OFFICE PROGRESS NOTE  Rogelia Boga, MD, MD [address] [place] [address]  DIAGNOSIS: Stage IIIA (T2 N2 Mx) non-small-cell lung cancer, squamous cell carcinoma diagnosed in June of 2009.   PRIOR THERAPY:  1. Status post left pneumonectomy with lymph node dissection under the care of Dr. Edwyna Shell on 12/28/2007. 2. Status post 4 cycles of adjuvant chemotherapy with cisplatin and docetaxel, last dose was given 04/29/2008.   CURRENT THERAPY: Observation   INTERVAL HISTORY: Benjamin Case 56 y.o. male returns to the clinic today for six-month followup visit accompanied by his wife. The patient has no complaints today except for shortness breath with exertion. He denied having any significant chest pain, no cough or hemoptysis. He try to apply for disability but his request was declined. The patient has repeat CT scan of the chest performed recently and he is here today for evaluation and discussion of his lab and scan results.   ALLERGIES:   has no known allergies.  MEDICATIONS:  Current Outpatient Prescriptions  Medication Sig Dispense Refill  . ALPRAZolam (XANAX) 0.5 MG tablet Take 0.5 mg by mouth at bedtime as needed.      Marland Kitchen amLODipine (NORVASC) 5 MG tablet Take 5 mg by mouth daily.        Marland Kitchen HYDROcodone-acetaminophen (NORCO) 7.5-325 MG per tablet Take 1 tablet by mouth every 6 (six) hours as needed. From Dr Johnn Hai At [place] at [place]       . esomeprazole (NEXIUM) 40 MG capsule Take 40 mg by mouth daily.          REVIEW OF SYSTEMS:  A comprehensive review of systems was negative except for: Respiratory: positive for dyspnea on exertion   PHYSICAL EXAMINATION: General appearance: alert, cooperative and no distress Neck: no adenopathy Lymph nodes: Cervical, supraclavicular, and axillary nodes normal. Resp: Clear to auscultation on the right, absent breath sounds and  dullness to percussion in the left. Cardio: regular rate and rhythm, S1, S2 normal, no murmur, click, rub or gallop GI: soft, non-tender; bowel sounds normal; no masses,  no organomegaly Extremities: extremities normal, atraumatic, no cyanosis or edema Neurologic: Alert and oriented X 3, normal strength and tone. Normal symmetric reflexes. Normal coordination and gait  ECOG PERFORMANCE STATUS: 1 - Symptomatic but completely ambulatory  Blood pressure 126/78, pulse 74, temperature 99 F (37.2 C), temperature source Oral, height 5\' 10"  (1.778 m), weight 189 lb 8 oz (85.957 kg).  LABORATORY DATA: Lab Results  Component Value Date   WBC 10.6* 10/20/2011   HGB 14.6 10/20/2011   HCT 43.0 10/20/2011   MCV 105.4* 10/20/2011   PLT 345 10/20/2011      Chemistry      Component Value Date/Time   NA 142 10/20/2011 1144   NA 139 04/17/2010 1202   K 5.0* 10/20/2011 1144   K 4.4 04/17/2010 1202   CL 96* 10/20/2011 1144   CL 102 04/17/2010 1202   CO2 28 10/20/2011 1144   CO2 29 04/17/2010 1202   BUN 15 10/20/2011 1144   BUN 11 04/17/2010 1202   CREATININE 1.2 10/20/2011 1144   CREATININE 1.43 04/17/2010 1202      Component Value Date/Time   CALCIUM 8.8 10/20/2011 1144   CALCIUM 9.3 04/17/2010 1202   ALKPHOS 55 10/20/2011 1144   ALKPHOS 49 04/17/2010 1202   AST 43* 10/20/2011 1144   AST 31 04/17/2010 1202   ALT  26 04/17/2010 1202   BILITOT 1.00 10/20/2011 1144   BILITOT 0.9 04/17/2010 1202       RADIOGRAPHIC STUDIES: Ct Chest W Contrast  10/20/2011  *RADIOLOGY REPORT*  Clinical Data: Lung cancer, status post left lobectomy, chemotherapy complete, shortness of breath  CT CHEST WITH CONTRAST  Technique:  Multidetector CT imaging of the chest was performed following the standard protocol during bolus administration of intravenous contrast.  Contrast: OMNIPAQUE IOHEXOL 300 MG/ML  SOLN  Comparison: 04/19/2011  Findings: Status post left pneumonectomy.  Stable pleural fluid. Leftward  cardiomediastinal shift.  Right lung is essentially clear.  Azygos lobe.  Mild paraseptal emphysematous changes at the right lung apex.  No pleural effusion or pneumothorax.  The visualized thyroid is unremarkable.  The heart is normal in size.  No pericardial effusion. Atherosclerotic calcifications of the aortic arch.  No suspicious mediastinal or axillary lymphadenopathy.  Visualized upper abdomen is notable for a stable 1.8 x 1.5 cm left adrenal nodule (series 2/image 59) and a probable hepatic cyst (series 2/image 62).  Mild degenerative changes of the thoracic spine.  Old right posterolateral rib deformities.  IMPRESSION: Status post left pneumonectomy.  No evidence of recurrent or metastatic disease in the chest.  Original Report Authenticated By: Charline Bills, M.D.    ASSESSMENT: This is a very pleasant 56 years old white male with history of stage IIIa non-small cell lung cancer status post left pneumonectomy followed by 4 cycles of adjuvant chemotherapy and has been observation since November of 2009 with no evidence for disease recurrence.  PLAN: I discussed the scan results with the patient and his wife. I recommended for him continuous observation for now with repeat CT scan of the chest in 6 months. He was advised to call me immediately if he has any concerning symptoms in the interval.  All questions were answered. The patient knows to call the clinic with any problems, questions or concerns. We can certainly see the patient much sooner if necessary.

## 2012-04-19 ENCOUNTER — Telehealth: Payer: Self-pay | Admitting: Internal Medicine

## 2012-04-19 NOTE — Telephone Encounter (Signed)
l/m  with new appt on 11/29 with dr mkm as he will be out of the office on 11/20    anne

## 2012-04-21 ENCOUNTER — Encounter: Payer: Self-pay | Admitting: Internal Medicine

## 2012-04-21 NOTE — Progress Notes (Signed)
04/21/2012  I spoke with the patient's wife, RE: Chest Ct Scan went to MD review, I have rescheduled patient for chest ct scan on Wed. 04/26/2012 @ 11:30am.  Mrs. Egnor was not very happy with this change but voiced understanding.   Bonita Quin [phone]

## 2012-04-24 ENCOUNTER — Ambulatory Visit: Payer: 59 | Admitting: Internal Medicine

## 2012-04-24 ENCOUNTER — Other Ambulatory Visit: Payer: 59

## 2012-04-24 ENCOUNTER — Ambulatory Visit (HOSPITAL_COMMUNITY): Payer: 59

## 2012-04-26 ENCOUNTER — Ambulatory Visit (HOSPITAL_COMMUNITY)
Admission: RE | Admit: 2012-04-26 | Discharge: 2012-04-26 | Disposition: A | Discharge status: Home / Self Care | Payer: 59 | Source: Ambulatory Visit | Attending: Internal Medicine | Admitting: Internal Medicine

## 2012-04-26 ENCOUNTER — Ambulatory Visit: Payer: 59 | Admitting: Internal Medicine

## 2012-04-26 DIAGNOSIS — E278 Other specified disorders of adrenal gland: Secondary | ICD-10-CM | POA: Insufficient documentation

## 2012-04-26 DIAGNOSIS — C3490 Malignant neoplasm of unspecified part of unspecified bronchus or lung: Secondary | ICD-10-CM | POA: Insufficient documentation

## 2012-04-26 MED ORDER — IOHEXOL 300 MG/ML  SOLN
80.0000 mL | Freq: Once | INTRAMUSCULAR | Status: AC | PRN
  Administered 2012-04-26: 80 mL via INTRAVENOUS

## 2012-04-27 ENCOUNTER — Telehealth: Payer: Self-pay | Admitting: Internal Medicine

## 2012-04-27 ENCOUNTER — Ambulatory Visit (HOSPITAL_BASED_OUTPATIENT_CLINIC_OR_DEPARTMENT_OTHER): Payer: 59

## 2012-04-27 ENCOUNTER — Ambulatory Visit (HOSPITAL_BASED_OUTPATIENT_CLINIC_OR_DEPARTMENT_OTHER): Payer: 59 | Admitting: Internal Medicine

## 2012-04-27 VITALS — BP 135/83 | HR 83 | Temp 97.5°F | Resp 18 | Ht 70.0 in | Wt 188.3 lb

## 2012-04-27 DIAGNOSIS — C3490 Malignant neoplasm of unspecified part of unspecified bronchus or lung: Secondary | ICD-10-CM

## 2012-04-27 DIAGNOSIS — C3410 Malignant neoplasm of upper lobe, unspecified bronchus or lung: Secondary | ICD-10-CM

## 2012-04-27 LAB — CBC WITH DIFFERENTIAL/PLATELET
Basophils Absolute: 0.1 10*3/uL (ref 0.0–0.1)
EOS%: 1.9 % (ref 0.0–7.0)
HGB: 15.4 g/dL (ref 13.0–17.1)
LYMPH%: 28.4 % (ref 14.0–49.0)
MCH: 36 pg — ABNORMAL HIGH (ref 27.2–33.4)
MCV: 102.7 fL — ABNORMAL HIGH (ref 79.3–98.0)
MONO%: 8.1 % (ref 0.0–14.0)
NEUT%: 60.9 % (ref 39.0–75.0)
RDW: 12.9 % (ref 11.0–14.6)

## 2012-04-27 LAB — COMPREHENSIVE METABOLIC PANEL (CC13)
AST: 39 U/L — ABNORMAL HIGH (ref 5–34)
Alkaline Phosphatase: 57 U/L (ref 40–150)
BUN: 13 mg/dL (ref 7.0–26.0)
Creatinine: 1.3 mg/dL (ref 0.7–1.3)
Potassium: 4.8 mEq/L (ref 3.5–5.1)
Total Bilirubin: 0.62 mg/dL (ref 0.20–1.20)

## 2012-04-27 NOTE — Progress Notes (Signed)
[facility] Telephone:([phone]) [phone]   Fax:([phone]) [phone]  OFFICE PROGRESS NOTE  Rogelia Boga, MD [address] [place] [address]  DIAGNOSIS: Stage IIIA (T2 N2 Mx) non-small-cell lung cancer, squamous cell carcinoma diagnosed in June of 2009.   PRIOR THERAPY:  1. Status post left pneumonectomy with lymph node dissection under the care of Dr. Edwyna Shell on 12/28/2007. 2. Status post 4 cycles of adjuvant chemotherapy with cisplatin and docetaxel, last dose was given 04/29/2008.  CURRENT THERAPY: Observation  INTERVAL HISTORY: Benjamin Case 56 y.o. male returns to the clinic today for routine six-month followup visit. The patient is feeling fine today with no specific complaints. He denied having any significant fatigue or weakness. He denied having any weight loss or night sweats. He has no chest pain but continues to have shortness breath with exertion, no cough or hemoptysis. The patient has repeat CT scan of the chest performed recently and he is here for evaluation and discussion of his scan results.  MEDICAL HISTORY:No past medical history on file.  ALLERGIES:   has no known allergies.  MEDICATIONS:  Current Outpatient Prescriptions  Medication Sig Dispense Refill  . ALPRAZolam (XANAX) 0.5 MG tablet Take 0.5 mg by mouth at bedtime as needed.      Marland Kitchen amLODipine (NORVASC) 5 MG tablet Take 5 mg by mouth daily.        Marland Kitchen esomeprazole (NEXIUM) 40 MG capsule Take 40 mg by mouth daily.        Marland Kitchen HYDROcodone-acetaminophen (NORCO) 7.5-325 MG per tablet Take 1 tablet by mouth every 6 (six) hours as needed. From Dr Johnn Hai At [place] [facility] [place]         REVIEW OF SYSTEMS:  A comprehensive review of systems was negative except for: Respiratory: positive for dyspnea on exertion   PHYSICAL EXAMINATION: General appearance: alert, cooperative and no distress Head: Normocephalic, without obvious abnormality, atraumatic Neck: no adenopathy Resp:  clear to auscultation bilaterally Cardio: regular rate and rhythm, S1, S2 normal, no murmur, click, rub or gallop GI: soft, non-tender; bowel sounds normal; no masses,  no organomegaly Extremities: extremities normal, atraumatic, no cyanosis or edema  ECOG PERFORMANCE STATUS: 1 - Symptomatic but completely ambulatory  Blood pressure 135/83, pulse 83, temperature 97.5 F (36.4 C), temperature source Oral, resp. rate 18, height 5\' 10"  (1.778 m), weight 188 lb 4.8 oz (85.412 kg).  LABORATORY DATA: Lab Results  Component Value Date   WBC 10.6* 10/20/2011   HGB 14.6 10/20/2011   HCT 43.0 10/20/2011   MCV 105.4* 10/20/2011   PLT 345 10/20/2011      Chemistry      Component Value Date/Time   NA 142 10/20/2011 1144   NA 139 04/17/2010 1202   K 5.0* 10/20/2011 1144   K 4.4 04/17/2010 1202   CL 96* 10/20/2011 1144   CL 102 04/17/2010 1202   CO2 28 10/20/2011 1144   CO2 29 04/17/2010 1202   BUN 15 10/20/2011 1144   BUN 11 04/17/2010 1202   CREATININE 1.2 10/20/2011 1144   CREATININE 1.43 04/17/2010 1202      Component Value Date/Time   CALCIUM 8.8 10/20/2011 1144   CALCIUM 9.3 04/17/2010 1202   ALKPHOS 55 10/20/2011 1144   ALKPHOS 49 04/17/2010 1202   AST 43* 10/20/2011 1144   AST 31 04/17/2010 1202   ALT 26 04/17/2010 1202   BILITOT 1.00 10/20/2011 1144   BILITOT 0.9 04/17/2010 1202  RADIOGRAPHIC STUDIES: Ct Chest W Contrast  04/26/2012  *RADIOLOGY REPORT*  Clinical Data: Staging lung cancer.  Status post left pneumonectomy.  CT CHEST WITH CONTRAST  Technique:  Multidetector CT imaging of the chest was performed following the standard protocol during bolus administration of intravenous contrast.  Contrast: 80mL OMNIPAQUE IOHEXOL 300 MG/ML  SOLN  Comparison: 10/20/2011  Findings: Lungs/pleura: Postoperative change from left pneumonectomy identified.  This is stable from previous exam. Right apical bulla is identified.  No pulmonary nodule or mass identified.  Heart/Mediastinum: There  is leftward shift of the mediastinum. Heart size appears mildly enlarged.  No pericardial effusion.  No mediastinal or hilar adenopathy identified.  Upper abdomen: Stable low density structure within the right hepatic lobe measuring 8.4 mm, image 61.  Nodule in the left adrenal gland measures 1.6 cm, image 57.  Stable from previous study.  Bones/Musculoskeletal:  Postsurgical changes involving the left ribs noted.  Chronic right posterior rib fracture deformities are noted.  No aggressive lytic or sclerotic bone lesions identified. No axillary or supraclavicular adenopathy identified.  IMPRESSION:  1.  No acute cardiopulmonary abnormalities. 2.  No specific features identified to suggest residual or recurrence of tumor status post left pneumonectomy. 3.  Stable left adrenal nodule and probable hepatic cyst.   Original Report Authenticated By: Signa Kell, M.D.     ASSESSMENT: This is a very pleasant 56 years old white male with history of stage IIIa non-small cell lung cancer status post left pneumonectomy followed by 4 cycles of adjuvant chemotherapy and has been observation since November of 2009 with no evidence for disease recurrence.   PLAN: I discussed the scan results with the patient today. I recommended for him to continue on observation with repeat CT scan of the chest in one year. The patient was advised to call me immediately if he has any concerning symptoms in the interval.  All questions were answered. The patient knows to call the clinic with any problems, questions or concerns. We can certainly see the patient much sooner if necessary.

## 2012-04-27 NOTE — Telephone Encounter (Signed)
Gave pt appt for November 2014 for lab and MD, radiology will call patient regarding chemo, pt sent to labs today

## 2012-04-28 NOTE — Patient Instructions (Signed)
No evidence for disease recurrence on the recent scan.  Followup in one year with repeat CT scan of the chest. 

## 2012-05-24 ENCOUNTER — Other Ambulatory Visit: Payer: Self-pay | Admitting: Gastroenterology

## 2013-04-26 ENCOUNTER — Ambulatory Visit (HOSPITAL_COMMUNITY)
Admission: RE | Admit: 2013-04-26 | Discharge: 2013-04-26 | Disposition: A | Discharge status: Home / Self Care | Payer: BC Managed Care – PPO | Source: Ambulatory Visit | Attending: Internal Medicine | Admitting: Internal Medicine

## 2013-04-26 ENCOUNTER — Other Ambulatory Visit: Payer: Self-pay | Admitting: Internal Medicine

## 2013-04-26 ENCOUNTER — Other Ambulatory Visit (HOSPITAL_BASED_OUTPATIENT_CLINIC_OR_DEPARTMENT_OTHER): Payer: BC Managed Care – PPO

## 2013-04-26 ENCOUNTER — Encounter (HOSPITAL_COMMUNITY): Payer: Self-pay

## 2013-04-26 DIAGNOSIS — C3490 Malignant neoplasm of unspecified part of unspecified bronchus or lung: Secondary | ICD-10-CM

## 2013-04-26 DIAGNOSIS — Z9221 Personal history of antineoplastic chemotherapy: Secondary | ICD-10-CM | POA: Insufficient documentation

## 2013-04-26 DIAGNOSIS — I700 Atherosclerosis of aorta: Secondary | ICD-10-CM | POA: Insufficient documentation

## 2013-04-26 DIAGNOSIS — C3410 Malignant neoplasm of upper lobe, unspecified bronchus or lung: Secondary | ICD-10-CM

## 2013-04-26 DIAGNOSIS — I2510 Atherosclerotic heart disease of native coronary artery without angina pectoris: Secondary | ICD-10-CM | POA: Insufficient documentation

## 2013-04-26 DIAGNOSIS — J438 Other emphysema: Secondary | ICD-10-CM | POA: Insufficient documentation

## 2013-04-26 DIAGNOSIS — I7090 Unspecified atherosclerosis: Secondary | ICD-10-CM | POA: Insufficient documentation

## 2013-04-26 DIAGNOSIS — K7689 Other specified diseases of liver: Secondary | ICD-10-CM | POA: Insufficient documentation

## 2013-04-26 HISTORY — DX: Malignant (primary) neoplasm, unspecified: C80.1

## 2013-04-26 HISTORY — DX: Essential (primary) hypertension: I10

## 2013-04-26 LAB — CBC WITH DIFFERENTIAL/PLATELET
Basophils Absolute: 0.1 10*3/uL (ref 0.0–0.1)
EOS%: 3.3 % (ref 0.0–7.0)
Eosinophils Absolute: 0.3 10*3/uL (ref 0.0–0.5)
HCT: 42.2 % (ref 38.4–49.9)
HGB: 14.2 g/dL (ref 13.0–17.1)
MCH: 34.7 pg — ABNORMAL HIGH (ref 27.2–33.4)
MCV: 102.8 fL — ABNORMAL HIGH (ref 79.3–98.0)
MONO%: 10.3 % (ref 0.0–14.0)
NEUT%: 51.7 % (ref 39.0–75.0)
Platelets: 282 10*3/uL (ref 140–400)

## 2013-04-26 LAB — COMPREHENSIVE METABOLIC PANEL (CC13)
AST: 28 U/L (ref 5–34)
Alkaline Phosphatase: 44 U/L (ref 40–150)
BUN: 12.3 mg/dL (ref 7.0–26.0)
Calcium: 9.2 mg/dL (ref 8.4–10.4)
Creatinine: 1.2 mg/dL (ref 0.7–1.3)
Glucose: 120 mg/dl (ref 70–140)

## 2013-04-30 ENCOUNTER — Encounter: Payer: Self-pay | Admitting: Internal Medicine

## 2013-04-30 ENCOUNTER — Ambulatory Visit (HOSPITAL_BASED_OUTPATIENT_CLINIC_OR_DEPARTMENT_OTHER): Payer: BC Managed Care – PPO | Admitting: Internal Medicine

## 2013-04-30 VITALS — BP 135/78 | HR 85 | Temp 97.4°F | Resp 20 | Ht 70.0 in | Wt 194.6 lb

## 2013-04-30 DIAGNOSIS — C3410 Malignant neoplasm of upper lobe, unspecified bronchus or lung: Secondary | ICD-10-CM

## 2013-04-30 DIAGNOSIS — C3490 Malignant neoplasm of unspecified part of unspecified bronchus or lung: Secondary | ICD-10-CM

## 2013-04-30 DIAGNOSIS — R0609 Other forms of dyspnea: Secondary | ICD-10-CM

## 2013-04-30 NOTE — Patient Instructions (Signed)
Followup visit in one year with repeat CT scan of the chest. 

## 2013-04-30 NOTE — Progress Notes (Signed)
[facility] Telephone:([phone]   Fax:(336) [phone]  OFFICE PROGRESS NOTE  Rogelia Boga, MD [address] [place] [address]  DIAGNOSIS: Stage IIIA (T2 N2 Mx) non-small-cell lung cancer, squamous cell carcinoma diagnosed in June of 2009.   PRIOR THERAPY:  1. Status post left pneumonectomy with lymph node dissection under the care of Dr. Edwyna Shell on 12/28/2007. 2. Status post 4 cycles of adjuvant chemotherapy with cisplatin and docetaxel, last dose was given 04/29/2008.  CURRENT THERAPY: Observation  INTERVAL HISTORY: Benjamin Case 57 y.o. male returns to the clinic today for routine six-month followup visit. The patient is feeling fine today with no specific complaints. He denied having any significant fatigue or weakness. He denied having any weight loss or night sweats. He has no chest pain but continues to have shortness breath with exertion, no cough or hemoptysis. The patient has repeat CT scan of the chest performed recently and he is here for evaluation and discussion of his scan results.  MEDICAL HISTORY: Past Medical History  Diagnosis Date  . lung ca dx'd 12/2007    lt lobectomy  . Hypertension     ALLERGIES:  has No Known Allergies.  MEDICATIONS:  Current Outpatient Prescriptions  Medication Sig Dispense Refill  . ALPRAZolam (XANAX) 0.5 MG tablet Take 0.5 mg by mouth at bedtime as needed.      Marland Kitchen amLODipine (NORVASC) 5 MG tablet Take 5 mg by mouth daily.        Marland Kitchen esomeprazole (NEXIUM) 40 MG capsule Take 40 mg by mouth daily.        Marland Kitchen HYDROcodone-acetaminophen (NORCO) 7.5-325 MG per tablet Take 1 tablet by mouth every 6 (six) hours as needed. From Dr Johnn Hai At [place] at [place]       . Pregabalin (LYRICA PO) Take by mouth. 3 tablets daily, pt unsure of dose       No current facility-administered medications for this visit.    REVIEW OF SYSTEMS:  A comprehensive review of systems was negative except for:  Respiratory: positive for dyspnea on exertion   PHYSICAL EXAMINATION: General appearance: alert, cooperative and no distress Head: Normocephalic, without obvious abnormality, atraumatic Neck: no adenopathy Resp: diminished breath sounds LLL and LUL and dullness to percussion LLL and LUL Cardio: regular rate and rhythm, S1, S2 normal, no murmur, click, rub or gallop GI: soft, non-tender; bowel sounds normal; no masses,  no organomegaly Extremities: extremities normal, atraumatic, no cyanosis or edema  ECOG PERFORMANCE STATUS: 1 - Symptomatic but completely ambulatory  Blood pressure 135/78, pulse 85, temperature 97.4 F (36.3 C), temperature source Oral, resp. rate 20, height 5\' 10"  (1.778 m), weight 194 lb 9.6 oz (88.27 kg).  LABORATORY DATA: Lab Results  Component Value Date   WBC 9.6 04/26/2013   HGB 14.2 04/26/2013   HCT 42.2 04/26/2013   MCV 102.8* 04/26/2013   PLT 282 04/26/2013      Chemistry      Component Value Date/Time   NA 142 04/26/2013 1033   NA 142 10/20/2011 1144   NA 139 04/17/2010 1202   K 4.3 04/26/2013 1033   K 5.0* 10/20/2011 1144   K 4.4 04/17/2010 1202   CL 103 04/27/2012 1253   CL 96* 10/20/2011 1144   CL 102 04/17/2010 1202   CO2 24 04/26/2013 1033   CO2 28 10/20/2011 1144   CO2 29 04/17/2010 1202   BUN 12.3 04/26/2013 1033   BUN 15 10/20/2011 1144  BUN 11 04/17/2010 1202   CREATININE 1.2 04/26/2013 1033   CREATININE 1.2 10/20/2011 1144   CREATININE 1.43 04/17/2010 1202      Component Value Date/Time   CALCIUM 9.2 04/26/2013 1033   CALCIUM 8.8 10/20/2011 1144   CALCIUM 9.3 04/17/2010 1202   ALKPHOS 44 04/26/2013 1033   ALKPHOS 55 10/20/2011 1144   ALKPHOS 49 04/17/2010 1202   AST 28 04/26/2013 1033   AST 43* 10/20/2011 1144   AST 31 04/17/2010 1202   ALT 34 04/26/2013 1033   ALT 38 10/20/2011 1144   ALT 26 04/17/2010 1202   BILITOT 0.42 04/26/2013 1033   BILITOT 1.00 10/20/2011 1144   BILITOT 0.9 04/17/2010 1202       RADIOGRAPHIC  STUDIES: Ct Chest Wo Contrast  04/26/2013   CLINICAL DATA:  Lung cancer status post left pneumonectomy. Chemotherapy completed in November 2009. Shortness of breath.  EXAM: CT CHEST WITHOUT CONTRAST  TECHNIQUE: Multidetector CT imaging of the chest was performed following the standard protocol without IV contrast.  COMPARISON:  Chest CT 04/26/2012.  FINDINGS: Mediastinum: Heart size is normal. There is no significant pericardial fluid, thickening or pericardial calcification. There is atherosclerosis of the thoracic aorta, the great vessels of the mediastinum and the coronary arteries, including calcified atherosclerotic plaque in the left main, left anterior descending, left circumflex and right coronary arteries. Chronic shift of cardiomediastinal structures into the left hemithorax related to prior left pneumonectomy. No pathologically enlarged mediastinal or hilar lymph nodes. Please note that accurate exclusion of hilar adenopathy is limited on noncontrast CT scans. Esophagus is unremarkable in appearance.  Lungs/Pleura: Postoperative changes of prior left pneumonectomy redemonstrated. Chronic fluid collection with thick walls in the left hemithorax, unchanged compared to the prior examination. Azygos lobe (normal anatomical variant) incidentally noted. Mild centrilobular and paraseptal emphysematous changes in the right apex. No acute consolidative airspace disease. No pleural effusions. No definite suspicious appearing pulmonary nodules or masses are identified.  Upper Abdomen: 8 mm low attenuation lesion in segment 7 of the liver is unchanged compared to the prior study (although incompletely characterized on today's noncontrast CT examination, stability compared to the prior study suggests a benign lesion such as a small cyst).  Musculoskeletal: There are no aggressive appearing lytic or blastic lesions noted in the visualized portions of the skeleton. Postthoracotomy changes in the low left hemithorax.  Old healed fractures of the posterior lateral aspects of the right 7th, 8th and 9th ribs incidentally noted.  IMPRESSION: 1. Status post left pneumonectomy, without evidence to suggest local recurrence of disease or new metastatic disease in the thorax. 2. Atherosclerosis, including left main and 3 vessel coronary artery disease. Please note that although the presence of coronary artery calcium documents the presence of coronary artery disease, the severity of this disease and any potential stenosis cannot be assessed on this non-gated CT examination. Assessment for potential risk factor modification, dietary therapy or pharmacologic therapy may be warranted, if clinically indicated. 3. Mild centrilobular and paraseptal emphysema. 4. Additional incidental findings, as above.   Electronically Signed   By: Trudie Reed M.D.   On: 04/26/2013 13:04    ASSESSMENT AND PLAN: This is a very pleasant 57 years old white male with history of stage IIIa non-small cell lung cancer status post left pneumonectomy followed by 4 cycles of adjuvant chemotherapy and has been observation since November of 2009 with no evidence for disease recurrence. I discussed the scan results with the patient today. I recommended for him to  continue on observation with repeat CT scan of the chest in one year. The patient was advised to call me immediately if he has any concerning symptoms in the interval.  All questions were answered. The patient knows to call the clinic with any problems, questions or concerns. We can certainly see the patient much sooner if necessary.

## 2013-05-01 ENCOUNTER — Telehealth: Payer: Self-pay | Admitting: Internal Medicine

## 2013-05-01 NOTE — Telephone Encounter (Signed)
appts made 1 year out per 04/30/13 POF CAL mailed to pt shh

## 2014-04-24 ENCOUNTER — Encounter (HOSPITAL_COMMUNITY): Payer: Self-pay

## 2014-04-24 ENCOUNTER — Ambulatory Visit (HOSPITAL_COMMUNITY)
Admission: RE | Admit: 2014-04-24 | Discharge: 2014-04-24 | Disposition: A | Discharge status: Home / Self Care | Payer: BC Managed Care – PPO | Source: Ambulatory Visit | Attending: Internal Medicine | Admitting: Internal Medicine

## 2014-04-24 ENCOUNTER — Other Ambulatory Visit (HOSPITAL_BASED_OUTPATIENT_CLINIC_OR_DEPARTMENT_OTHER): Payer: BC Managed Care – PPO

## 2014-04-24 DIAGNOSIS — R0602 Shortness of breath: Secondary | ICD-10-CM | POA: Insufficient documentation

## 2014-04-24 DIAGNOSIS — Z9221 Personal history of antineoplastic chemotherapy: Secondary | ICD-10-CM | POA: Insufficient documentation

## 2014-04-24 DIAGNOSIS — C3490 Malignant neoplasm of unspecified part of unspecified bronchus or lung: Secondary | ICD-10-CM

## 2014-04-24 DIAGNOSIS — D3502 Benign neoplasm of left adrenal gland: Secondary | ICD-10-CM | POA: Diagnosis not present

## 2014-04-24 DIAGNOSIS — C3410 Malignant neoplasm of upper lobe, unspecified bronchus or lung: Secondary | ICD-10-CM

## 2014-04-24 LAB — COMPREHENSIVE METABOLIC PANEL (CC13)
ALK PHOS: 54 U/L (ref 40–150)
ALT: 45 U/L (ref 0–55)
AST: 49 U/L — ABNORMAL HIGH (ref 5–34)
Albumin: 4.1 g/dL (ref 3.5–5.0)
Anion Gap: 13 mEq/L — ABNORMAL HIGH (ref 3–11)
BILIRUBIN TOTAL: 0.88 mg/dL (ref 0.20–1.20)
BUN: 18 mg/dL (ref 7.0–26.0)
CO2: 26 mEq/L (ref 22–29)
Calcium: 9.8 mg/dL (ref 8.4–10.4)
Chloride: 100 mEq/L (ref 98–109)
Creatinine: 1.4 mg/dL — ABNORMAL HIGH (ref 0.7–1.3)
Glucose: 118 mg/dl (ref 70–140)
Potassium: 3.9 mEq/L (ref 3.5–5.1)
SODIUM: 139 meq/L (ref 136–145)
TOTAL PROTEIN: 7.2 g/dL (ref 6.4–8.3)

## 2014-04-24 LAB — CBC WITH DIFFERENTIAL/PLATELET
BASO%: 1 % (ref 0.0–2.0)
Basophils Absolute: 0.1 10*3/uL (ref 0.0–0.1)
EOS%: 2.9 % (ref 0.0–7.0)
Eosinophils Absolute: 0.3 10*3/uL (ref 0.0–0.5)
HCT: 50.3 % — ABNORMAL HIGH (ref 38.4–49.9)
HGB: 16.6 g/dL (ref 13.0–17.1)
LYMPH%: 33.3 % (ref 14.0–49.0)
MCH: 34.4 pg — AB (ref 27.2–33.4)
MCHC: 32.9 g/dL (ref 32.0–36.0)
MCV: 104.5 fL — AB (ref 79.3–98.0)
MONO#: 1.2 10*3/uL — ABNORMAL HIGH (ref 0.1–0.9)
MONO%: 10.7 % (ref 0.0–14.0)
NEUT#: 6 10*3/uL (ref 1.5–6.5)
NEUT%: 52.1 % (ref 39.0–75.0)
Platelets: 241 10*3/uL (ref 140–400)
RBC: 4.82 10*6/uL (ref 4.20–5.82)
RDW: 13.4 % (ref 11.0–14.6)
WBC: 11.4 10*3/uL — ABNORMAL HIGH (ref 4.0–10.3)
lymph#: 3.8 10*3/uL — ABNORMAL HIGH (ref 0.9–3.3)

## 2014-05-01 ENCOUNTER — Other Ambulatory Visit: Payer: BC Managed Care – PPO | Admitting: Lab

## 2014-05-01 ENCOUNTER — Ambulatory Visit (HOSPITAL_BASED_OUTPATIENT_CLINIC_OR_DEPARTMENT_OTHER): Payer: BC Managed Care – PPO | Admitting: Internal Medicine

## 2014-05-01 ENCOUNTER — Encounter: Payer: Self-pay | Admitting: Internal Medicine

## 2014-05-01 ENCOUNTER — Telehealth: Payer: Self-pay | Admitting: Internal Medicine

## 2014-05-01 VITALS — BP 125/71 | HR 87 | Temp 98.0°F | Resp 18 | Ht 70.0 in | Wt 200.0 lb

## 2014-05-01 DIAGNOSIS — Z85118 Personal history of other malignant neoplasm of bronchus and lung: Secondary | ICD-10-CM

## 2014-05-01 DIAGNOSIS — C3492 Malignant neoplasm of unspecified part of left bronchus or lung: Secondary | ICD-10-CM

## 2014-05-01 NOTE — Telephone Encounter (Signed)
gv adn printeda ppt sched and avs for pt for NOV 2016

## 2014-05-01 NOTE — Patient Instructions (Signed)
Smoking Cessation, Tips for Success  If you are ready to quit smoking, congratulations! You have chosen to help yourself be healthier. Cigarettes bring nicotine, tar, carbon monoxide, and other irritants into your body. Your lungs, heart, and blood vessels will be able to work better without these poisons. There are many different ways to quit smoking. Nicotine gum, nicotine patches, a nicotine inhaler, or nicotine nasal spray can help with physical craving. Hypnosis, support groups, and medicines help break the habit of smoking.  WHAT THINGS CAN I DO TO MAKE QUITTING EASIER?   Here are some tips to help you quit for good:  · Pick a date when you will quit smoking completely. Tell all of your friends and family about your plan to quit on that date.  · Do not try to slowly cut down on the number of cigarettes you are smoking. Pick a quit date and quit smoking completely starting on that day.  · Throw away all cigarettes.    · Clean and remove all ashtrays from your home, work, and car.  · On a card, write down your reasons for quitting. Carry the card with you and read it when you get the urge to smoke.  · Cleanse your body of nicotine. Drink enough water and fluids to keep your urine clear or pale yellow. Do this after quitting to flush the nicotine from your body.  · Learn to predict your moods. Do not let a bad situation be your excuse to have a cigarette. Some situations in your life might tempt you into wanting a cigarette.  · Never have "just one" cigarette. It leads to wanting another and another. Remind yourself of your decision to quit.  · Change habits associated with smoking. If you smoked while driving or when feeling stressed, try other activities to replace smoking. Stand up when drinking your coffee. Brush your teeth after eating. Sit in a different chair when you read the paper. Avoid alcohol while trying to quit, and try to drink fewer caffeinated beverages. Alcohol and caffeine may urge you to  smoke.  · Avoid foods and drinks that can trigger a desire to smoke, such as sugary or spicy foods and alcohol.  · Ask people who smoke not to smoke around you.  · Have something planned to do right after eating or having a cup of coffee. For example, plan to take a walk or exercise.  · Try a relaxation exercise to calm you down and decrease your stress. Remember, you may be tense and nervous for the first 2 weeks after you quit, but this will pass.  · Find new activities to keep your hands busy. Play with a pen, coin, or rubber band. Doodle or draw things on paper.  · Brush your teeth right after eating. This will help cut down on the craving for the taste of tobacco after meals. You can also try mouthwash.    · Use oral substitutes in place of cigarettes. Try using lemon drops, carrots, cinnamon sticks, or chewing gum. Keep them handy so they are available when you have the urge to smoke.  · When you have the urge to smoke, try deep breathing.  · Designate your home as a nonsmoking area.  · If you are a heavy smoker, ask your health care provider about a prescription for nicotine chewing gum. It can ease your withdrawal from nicotine.  · Reward yourself. Set aside the cigarette money you save and buy yourself something nice.  · Look for   support from others. Join a support group or smoking cessation program. Ask someone at home or at work to help you with your plan to quit smoking.  · Always ask yourself, "Do I need this cigarette or is this just a reflex?" Tell yourself, "Today, I choose not to smoke," or "I do not want to smoke." You are reminding yourself of your decision to quit.  · Do not replace cigarette smoking with electronic cigarettes (commonly called e-cigarettes). The safety of e-cigarettes is unknown, and some may contain harmful chemicals.  · If you relapse, do not give up! Plan ahead and think about what you will do the next time you get the urge to smoke.  HOW WILL I FEEL WHEN I QUIT SMOKING?  You  may have symptoms of withdrawal because your body is used to nicotine (the addictive substance in cigarettes). You may crave cigarettes, be irritable, feel very hungry, cough often, get headaches, or have difficulty concentrating. The withdrawal symptoms are only temporary. They are strongest when you first quit but will go away within 10-14 days. When withdrawal symptoms occur, stay in control. Think about your reasons for quitting. Remind yourself that these are signs that your body is healing and getting used to being without cigarettes. Remember that withdrawal symptoms are easier to treat than the major diseases that smoking can cause.   Even after the withdrawal is over, expect periodic urges to smoke. However, these cravings are generally short lived and will go away whether you smoke or not. Do not smoke!  WHAT RESOURCES ARE AVAILABLE TO HELP ME QUIT SMOKING?  Your health care provider can direct you to community resources or hospitals for support, which may include:  · Group support.  · Education.  · Hypnosis.  · Therapy.  Document Released: 02/20/2004 Document Revised: 10/08/2013 Document Reviewed: 11/09/2012  [employer]® Patient Information ©2015 [employer]. This information is not intended to replace advice given to you by your health care provider. Make sure you discuss any questions you have with your health care provider.

## 2014-05-01 NOTE — Progress Notes (Signed)
[facility] [phone]   Fax:([phone]) [phone]  OFFICE PROGRESS NOTE  Nyoka Cowden, MD [place] [address]  DIAGNOSIS: Stage IIIA (T2 N2 Mx) non-small-cell lung cancer, squamous cell carcinoma diagnosed in June of 2009.   PRIOR THERAPY:  1. Status post left pneumonectomy with lymph node dissection under the care of Dr. Arlyce Dice on 12/28/2007. 2. Status post 4 cycles of adjuvant chemotherapy with cisplatin and docetaxel, last dose was given 04/29/2008.  CURRENT THERAPY: Observation  INTERVAL HISTORY: Benjamin Case 58 y.o. male returns to the clinic today for routine annual month followup visit accompanied by his wife. The patient is feeling fine today with no specific complaints except for the persistent peripheral neuropathy and he is currently on Lyrica. He denied having any significant fatigue or weakness. He denied having any weight loss or night sweats. He has no chest pain but continues to have shortness breath with exertion, no cough or hemoptysis. The patient has repeat CT scan of the chest performed recently and he is here for evaluation and discussion of his scan results.  MEDICAL HISTORY: Past Medical History  Diagnosis Date  . Hypertension   . lung ca dx'd 12/2007    lt lobectomy    ALLERGIES:  has No Known Allergies.  MEDICATIONS:  Current Outpatient Prescriptions  Medication Sig Dispense Refill  . ALPRAZolam (XANAX) 0.5 MG tablet Take 0.5 mg by mouth at bedtime as needed.    Marland Kitchen amLODipine (NORVASC) 5 MG tablet Take 5 mg by mouth daily.      Marland Kitchen esomeprazole (NEXIUM) 40 MG capsule Take 40 mg by mouth daily.      Marland Kitchen HYDROcodone-acetaminophen (NORCO) 7.5-325 MG per tablet Take 1 tablet by mouth every 6 (six) hours as needed. From Dr Alessandra Grout At [facility] at [place]     . LYRICA 100 MG capsule Take 100 mg by mouth 3 (three) times daily.  2   No current facility-administered medications for this visit.      REVIEW OF SYSTEMS:  A comprehensive review of systems was negative except for: Respiratory: positive for dyspnea on exertion   PHYSICAL EXAMINATION: General appearance: alert, cooperative and no distress Head: Normocephalic, without obvious abnormality, atraumatic Neck: no adenopathy Resp: diminished breath sounds LLL and LUL and dullness to percussion LLL and LUL Cardio: regular rate and rhythm, S1, S2 normal, no murmur, click, rub or gallop GI: soft, non-tender; bowel sounds normal; no masses,  no organomegaly Extremities: extremities normal, atraumatic, no cyanosis or edema  ECOG PERFORMANCE STATUS: 1 - Symptomatic but completely ambulatory  Blood pressure 125/71, pulse 87, temperature 98 F (36.7 C), temperature source Oral, resp. rate 18, height 5\' 10"  (1.778 m), weight 200 lb (90.719 kg), SpO2 91 %.  LABORATORY DATA: Lab Results  Component Value Date   WBC 11.4* 04/24/2014   HGB 16.6 04/24/2014   HCT 50.3* 04/24/2014   MCV 104.5* 04/24/2014   PLT 241 04/24/2014      Chemistry      Component Value Date/Time   NA 139 04/24/2014 0746   NA 142 10/20/2011 1144   NA 139 04/17/2010 1202   K 3.9 04/24/2014 0746   K 5.0* 10/20/2011 1144   K 4.4 04/17/2010 1202   CL 103 04/27/2012 1253   CL 96* 10/20/2011 1144   CL 102 04/17/2010 1202   CO2 26 04/24/2014 0746   CO2 28 10/20/2011 1144   CO2 29 04/17/2010 1202   BUN  18.0 04/24/2014 0746   BUN 15 10/20/2011 1144   BUN 11 04/17/2010 1202   CREATININE 1.4* 04/24/2014 0746   CREATININE 1.2 10/20/2011 1144   CREATININE 1.43 04/17/2010 1202      Component Value Date/Time   CALCIUM 9.8 04/24/2014 0746   CALCIUM 8.8 10/20/2011 1144   CALCIUM 9.3 04/17/2010 1202   ALKPHOS 54 04/24/2014 0746   ALKPHOS 55 10/20/2011 1144   ALKPHOS 49 04/17/2010 1202   AST 49* 04/24/2014 0746   AST 43* 10/20/2011 1144   AST 31 04/17/2010 1202   ALT 45 04/24/2014 0746   ALT 38 10/20/2011 1144   ALT 26 04/17/2010 1202   BILITOT 0.88  04/24/2014 0746   BILITOT 1.00 10/20/2011 1144   BILITOT 0.9 04/17/2010 1202       RADIOGRAPHIC STUDIES: Ct Chest Wo Contrast  04/24/2014   CLINICAL DATA:  Lung cancer, chemotherapy completed in 2009. Shortness of breath.  EXAM: CT CHEST WITHOUT CONTRAST  TECHNIQUE: Multidetector CT imaging of the chest was performed following the standard protocol without IV contrast.  COMPARISON:  04/26/2013.  FINDINGS: Mediastinal lymph nodes are not enlarged by CT size criteria. Hilar regions are difficult to evaluate without IV contrast. No axillary adenopathy. Lymph nodes adjacent to the esophagus are subcentimeter in short axis size. Pulmonary arteries are enlarged. Heart is at the upper limits of normal in size to mildly enlarged. Three-vessel coronary artery calcification. No pericardial effusion.  Postoperative changes of left pneumonectomy, stable. No extrapleural lymph nodes or nodularity. Mild compensatory hypertrophy of the right lung with bullous changes at the apex. Few scattered millimetric pulmonary nodular densities are unchanged in the right lung. No right pleural fluid. Airway is otherwise unremarkable.  Incidental imaging of the upper abdomen shows a 10 mm low-attenuation lesion in the right hepatic lobe, stable and likely a cyst. Visualized portions of the liver, gallbladder and right adrenal gland are unremarkable. A 1.5 x 2.1 cm fluid density nodule in the left adrenal gland is unchanged. Visualized portions of the kidneys, spleen, pancreas, stomach and bowel are otherwise grossly unremarkable. No upper abdominal adenopathy.  No worrisome lytic or sclerotic lesions. Old right rib fractures. Thoracotomy changes on the left.  IMPRESSION: 1. Postoperative changes of left pneumonectomy without evidence of recurrent or metastatic disease. 2. Enlarged pulmonary arteries, indicative of pulmonary arterial hypertension. 3. Three-vessel coronary artery calcification. 4. Left adrenal adenoma.   Electronically  Signed   By: Lorin Picket M.D.   On: 04/24/2014 08:55   ASSESSMENT AND PLAN: This is a very pleasant 58 years old white male with history of stage IIIa non-small cell lung cancer status post left pneumonectomy followed by 4 cycles of adjuvant chemotherapy and has been observation since November of 2009 with no evidence for disease recurrence. I discussed the scan results with the patient today. I recommended for him to continue on observation with repeat CT scan of the chest in one year. The patient was advised to call me immediately if he has any concerning symptoms in the interval.  All questions were answered. The patient knows to call the clinic with any problems, questions or concerns. We can certainly see the patient much sooner if necessary.  Disclaimer: This note was dictated with voice recognition software. Similar sounding words can inadvertently be transcribed and may be missed upon review.

## 2015-04-28 ENCOUNTER — Ambulatory Visit (HOSPITAL_COMMUNITY)
Admission: RE | Admit: 2015-04-28 | Discharge: 2015-04-28 | Disposition: A | Discharge status: Home / Self Care | Payer: BLUE CROSS/BLUE SHIELD | Source: Ambulatory Visit | Attending: Internal Medicine | Admitting: Internal Medicine

## 2015-04-28 ENCOUNTER — Other Ambulatory Visit (HOSPITAL_BASED_OUTPATIENT_CLINIC_OR_DEPARTMENT_OTHER): Payer: BLUE CROSS/BLUE SHIELD

## 2015-04-28 DIAGNOSIS — C3492 Malignant neoplasm of unspecified part of left bronchus or lung: Secondary | ICD-10-CM | POA: Diagnosis not present

## 2015-04-28 DIAGNOSIS — Z9889 Other specified postprocedural states: Secondary | ICD-10-CM | POA: Insufficient documentation

## 2015-04-28 DIAGNOSIS — D3502 Benign neoplasm of left adrenal gland: Secondary | ICD-10-CM | POA: Insufficient documentation

## 2015-04-28 DIAGNOSIS — I2510 Atherosclerotic heart disease of native coronary artery without angina pectoris: Secondary | ICD-10-CM | POA: Diagnosis not present

## 2015-04-28 DIAGNOSIS — I289 Disease of pulmonary vessels, unspecified: Secondary | ICD-10-CM | POA: Diagnosis not present

## 2015-04-28 LAB — CBC WITH DIFFERENTIAL/PLATELET
BASO%: 0.7 % (ref 0.0–2.0)
Basophils Absolute: 0.1 10*3/uL (ref 0.0–0.1)
EOS%: 2.6 % (ref 0.0–7.0)
Eosinophils Absolute: 0.3 10*3/uL (ref 0.0–0.5)
HCT: 50.9 % — ABNORMAL HIGH (ref 38.4–49.9)
HGB: 17.3 g/dL — ABNORMAL HIGH (ref 13.0–17.1)
LYMPH%: 22.3 % (ref 14.0–49.0)
MCH: 34.9 pg — ABNORMAL HIGH (ref 27.2–33.4)
MCHC: 33.9 g/dL (ref 32.0–36.0)
MCV: 102.7 fL — ABNORMAL HIGH (ref 79.3–98.0)
MONO#: 1.2 10*3/uL — ABNORMAL HIGH (ref 0.1–0.9)
MONO%: 9.3 % (ref 0.0–14.0)
NEUT%: 65.1 % (ref 39.0–75.0)
NEUTROS ABS: 8.4 10*3/uL — AB (ref 1.5–6.5)
PLATELETS: 271 10*3/uL (ref 140–400)
RBC: 4.95 10*6/uL (ref 4.20–5.82)
RDW: 13.2 % (ref 11.0–14.6)
WBC: 12.8 10*3/uL — AB (ref 4.0–10.3)
lymph#: 2.9 10*3/uL (ref 0.9–3.3)

## 2015-04-28 LAB — COMPREHENSIVE METABOLIC PANEL (CC13)
ALT: 33 U/L (ref 0–55)
ANION GAP: 10 meq/L (ref 3–11)
AST: 31 U/L (ref 5–34)
Albumin: 3.8 g/dL (ref 3.5–5.0)
Alkaline Phosphatase: 51 U/L (ref 40–150)
BILIRUBIN TOTAL: 0.71 mg/dL (ref 0.20–1.20)
BUN: 11.9 mg/dL (ref 7.0–26.0)
CO2: 33 mEq/L — ABNORMAL HIGH (ref 22–29)
CREATININE: 1.6 mg/dL — AB (ref 0.7–1.3)
Calcium: 9.5 mg/dL (ref 8.4–10.4)
Chloride: 97 mEq/L — ABNORMAL LOW (ref 98–109)
EGFR: 44 mL/min/{1.73_m2} — ABNORMAL LOW (ref >90)
Glucose: 137 mg/dl (ref 70–140)
Potassium: 4.3 mEq/L (ref 3.5–5.1)
Sodium: 140 mEq/L (ref 136–145)
TOTAL PROTEIN: 6.8 g/dL (ref 6.4–8.3)

## 2015-05-05 ENCOUNTER — Encounter: Payer: Self-pay | Admitting: Internal Medicine

## 2015-05-05 ENCOUNTER — Ambulatory Visit (HOSPITAL_BASED_OUTPATIENT_CLINIC_OR_DEPARTMENT_OTHER): Payer: BLUE CROSS/BLUE SHIELD | Admitting: Internal Medicine

## 2015-05-05 VITALS — BP 139/61 | HR 74 | Temp 98.1°F | Resp 18 | Ht 70.0 in | Wt 206.7 lb

## 2015-05-05 DIAGNOSIS — C3492 Malignant neoplasm of unspecified part of left bronchus or lung: Secondary | ICD-10-CM

## 2015-05-05 DIAGNOSIS — Z85118 Personal history of other malignant neoplasm of bronchus and lung: Secondary | ICD-10-CM

## 2015-05-05 DIAGNOSIS — Z9221 Personal history of antineoplastic chemotherapy: Secondary | ICD-10-CM

## 2015-05-05 NOTE — Progress Notes (Signed)
[place] Telephone:(336) [phone]   Fax:(336) [phone]  OFFICE PROGRESS NOTE  Lilian Coma, MD [address] [place] [address]  DIAGNOSIS: Stage IIIA (T2 N2 Mx) non-small-cell lung cancer, squamous cell carcinoma diagnosed in June of 2009.   PRIOR THERAPY:  1. Status post left pneumonectomy with lymph node dissection under the care of Dr. Arlyce Dice on 12/28/2007. 2. Status post 4 cycles of adjuvant chemotherapy with cisplatin and docetaxel, last dose was given 04/29/2008.  CURRENT THERAPY: Observation.  INTERVAL HISTORY: Benjamin Case 59 y.o. male returns to the clinic today for routine annual month followup visit accompanied by his wife. The patient is feeling fine today with no specific complaints except for the persistent peripheral neuropathy and shortness breath at baseline and increased with exertion. He denied having any significant fatigue or weakness. He denied having any weight loss or night sweats. He has no chest pain, cough or hemoptysis. The patient has repeat CT scan of the chest performed recently and he is here for evaluation and discussion of his scan results.  MEDICAL HISTORY: Past Medical History  Diagnosis Date  . Hypertension   . lung ca dx'd 12/2007    lt lobectomy    ALLERGIES:  has No Known Allergies.  MEDICATIONS:  Current Outpatient Prescriptions  Medication Sig Dispense Refill  . ALPRAZolam (XANAX) 0.5 MG tablet Take 0.5 mg by mouth at bedtime as needed.    . ALPRAZolam (XANAX) 1 MG tablet Take 1 mg by mouth 2 (two) times daily.  5  . amLODipine (NORVASC) 5 MG tablet Take 5 mg by mouth daily.      Marland Kitchen CIALIS 20 MG tablet TAKE 1 TABLET BY MOUTH ONCE A DAY AS NEEDED  5  . esomeprazole (NEXIUM) 40 MG capsule Take 40 mg by mouth daily.      Marland Kitchen HYDROcodone-acetaminophen (NORCO) 7.5-325 MG per tablet Take 1 tablet by mouth every 6 (six) hours as needed. From Dr Alessandra Grout At [facility] at [place]     . LYRICA  100 MG capsule Take 100 mg by mouth 3 (three) times daily.  2   No current facility-administered medications for this visit.    REVIEW OF SYSTEMS:  A comprehensive review of systems was negative except for: Respiratory: positive for dyspnea on exertion   PHYSICAL EXAMINATION: General appearance: alert, cooperative and no distress Head: Normocephalic, without obvious abnormality, atraumatic Neck: no adenopathy Resp: diminished breath sounds LLL and LUL and dullness to percussion LLL and LUL Cardio: regular rate and rhythm, S1, S2 normal, no murmur, click, rub or gallop GI: soft, non-tender; bowel sounds normal; no masses,  no organomegaly Extremities: extremities normal, atraumatic, no cyanosis or edema  ECOG PERFORMANCE STATUS: 1 - Symptomatic but completely ambulatory  Blood pressure 139/61, pulse 74, temperature 98.1 F (36.7 C), temperature source Oral, resp. rate 18, height '5\' 10"'$  (1.778 m), weight 206 lb 11.2 oz (93.759 kg), SpO2 94 %.  LABORATORY DATA: Lab Results  Component Value Date   WBC 12.8* 04/28/2015   HGB 17.3* 04/28/2015   HCT 50.9* 04/28/2015   MCV 102.7* 04/28/2015   PLT 271 04/28/2015      Chemistry      Component Value Date/Time   NA 140 04/28/2015 1011   NA 142 10/20/2011 1144   NA 139 04/17/2010 1202   K 4.3 04/28/2015 1011   K 5.0* 10/20/2011 1144   K 4.4 04/17/2010 1202   CL 103 04/27/2012 1253  CL 96* 10/20/2011 1144   CL 102 04/17/2010 1202   CO2 33* 04/28/2015 1011   CO2 28 10/20/2011 1144   CO2 29 04/17/2010 1202   BUN 11.9 04/28/2015 1011   BUN 15 10/20/2011 1144   BUN 11 04/17/2010 1202   CREATININE 1.6* 04/28/2015 1011   CREATININE 1.2 10/20/2011 1144   CREATININE 1.43 04/17/2010 1202      Component Value Date/Time   CALCIUM 9.5 04/28/2015 1011   CALCIUM 8.8 10/20/2011 1144   CALCIUM 9.3 04/17/2010 1202   ALKPHOS 51 04/28/2015 1011   ALKPHOS 55 10/20/2011 1144   ALKPHOS 49 04/17/2010 1202   AST 31 04/28/2015 1011   AST 43*  10/20/2011 1144   AST 31 04/17/2010 1202   ALT 33 04/28/2015 1011   ALT 38 10/20/2011 1144   ALT 26 04/17/2010 1202   BILITOT 0.71 04/28/2015 1011   BILITOT 1.00 10/20/2011 1144   BILITOT 0.9 04/17/2010 1202       RADIOGRAPHIC STUDIES: Ct Chest Wo Contrast  04/28/2015  CLINICAL DATA:  Lung cancer, yearly follow-up. Chemotherapy completed in 2009. EXAM: CT CHEST WITHOUT CONTRAST TECHNIQUE: Multidetector CT imaging of the chest was performed following the standard protocol without IV contrast. COMPARISON:  04/24/2014. FINDINGS: Mediastinum/Nodes: Mediastinal lymph nodes are not enlarged by CT size criteria. Hilar regions are difficult to definitively evaluate without IV contrast. No axillary adenopathy. Pulmonary arteries are enlarged. Heart is at the upper limits of normal in size. Coronary artery calcification, three-vessel. No pericardial effusion. Lungs/Pleura: Bullous changes at the apex of the right hemi thorax. Azygos fissure incidentally noted. Mild centrilobular emphysema. No right pleural fluid. Left pneumonectomy. Upper abdomen: 1.2 cm low-attenuation lesion in the right hepatic lobe is unchanged and likely a cyst. Visualized portions of the liver, gallbladder and right adrenal gland are unremarkable. Fluid density nodule in the left adrenal gland measures 2.0 cm, stable. Visualized portions of the kidneys, spleen, pancreas, stomach and bowel are grossly unremarkable. No upper abdominal adenopathy. Musculoskeletal: No worrisome lytic or sclerotic lesions. Degenerative changes are seen in the spine. Thoracotomy changes on the left. IMPRESSION: 1. Postoperative changes of left pneumonectomy without evidence of recurrent or metastatic disease. 2. Three-vessel coronary artery calcification. 3. Enlarged pulmonary arteries, indicative of pulmonary arterial hypertension. 4. Left adrenal adenoma. Electronically Signed   By: Lorin Picket M.D.   On: 04/28/2015 12:41   ASSESSMENT AND PLAN: This is  a very pleasant 59 years old white male with history of stage IIIa non-small cell lung cancer status post left pneumonectomy followed by 4 cycles of adjuvant chemotherapy and has been observation since November of 2009 with no evidence for disease recurrence. The recent CT scan of the chest showed no evidence for disease recurrence. I discussed the scan results with the patient and his wife today. I recommended for him to continue on observation with repeat CT scan of the chest in one year. The patient was advised to call me immediately if he has any concerning symptoms in the interval.  All questions were answered. The patient knows to call the clinic with any problems, questions or concerns. We can certainly see the patient much sooner if necessary.  Disclaimer: This note was dictated with voice recognition software. Similar sounding words can inadvertently be transcribed and may be missed upon review.

## 2015-12-25 DIAGNOSIS — M545 Low back pain: Secondary | ICD-10-CM | POA: Diagnosis not present

## 2016-01-16 DIAGNOSIS — M545 Low back pain: Secondary | ICD-10-CM | POA: Diagnosis not present

## 2016-01-19 ENCOUNTER — Other Ambulatory Visit: Payer: Self-pay | Admitting: Orthopedic Surgery

## 2016-01-19 DIAGNOSIS — M545 Low back pain: Principal | ICD-10-CM

## 2016-01-19 DIAGNOSIS — G8929 Other chronic pain: Secondary | ICD-10-CM

## 2016-01-22 ENCOUNTER — Ambulatory Visit
Admission: RE | Admit: 2016-01-22 | Discharge: 2016-01-22 | Disposition: A | Discharge status: Home / Self Care | Payer: Medicare Other | Source: Ambulatory Visit | Attending: Orthopedic Surgery | Admitting: Orthopedic Surgery

## 2016-01-22 DIAGNOSIS — M545 Low back pain, unspecified: Secondary | ICD-10-CM

## 2016-01-22 DIAGNOSIS — G8929 Other chronic pain: Secondary | ICD-10-CM

## 2016-01-22 MED ORDER — ONDANSETRON HCL 4 MG/2ML IJ SOLN
4.0000 mg | Freq: Once | INTRAMUSCULAR | Status: AC
Start: 2016-01-22 — End: 2016-01-22
  Administered 2016-01-22: 4 mg via INTRAMUSCULAR

## 2016-01-22 MED ORDER — MEPERIDINE HCL 100 MG/ML IJ SOLN
100.0000 mg | Freq: Once | INTRAMUSCULAR | Status: AC
  Administered 2016-01-22: 100 mg via INTRAMUSCULAR

## 2016-01-22 MED ORDER — DIAZEPAM 5 MG PO TABS
10.0000 mg | ORAL_TABLET | Freq: Once | ORAL | Status: AC
  Administered 2016-01-22: 10 mg via ORAL

## 2016-01-22 MED ORDER — IOPAMIDOL (ISOVUE-M 200) INJECTION 41%
15.0000 mL | Freq: Once | INTRAMUSCULAR | Status: AC
  Administered 2016-01-22: 15 mL via INTRATHECAL

## 2016-01-22 NOTE — Discharge Instructions (Signed)

## 2016-01-26 ENCOUNTER — Telehealth: Payer: Self-pay | Admitting: Radiology

## 2016-01-26 DIAGNOSIS — M545 Low back pain: Secondary | ICD-10-CM | POA: Diagnosis not present

## 2016-01-26 NOTE — Telephone Encounter (Signed)
Wife states pt did just fine.

## 2016-02-06 DIAGNOSIS — M5117 Intervertebral disc disorders with radiculopathy, lumbosacral region: Secondary | ICD-10-CM | POA: Diagnosis not present

## 2016-02-06 DIAGNOSIS — M5136 Other intervertebral disc degeneration, lumbar region: Secondary | ICD-10-CM | POA: Diagnosis not present

## 2016-02-06 DIAGNOSIS — M961 Postlaminectomy syndrome, not elsewhere classified: Secondary | ICD-10-CM | POA: Diagnosis not present

## 2016-02-20 DIAGNOSIS — M545 Low back pain: Secondary | ICD-10-CM | POA: Diagnosis not present

## 2016-03-05 DIAGNOSIS — Z902 Acquired absence of lung [part of]: Secondary | ICD-10-CM | POA: Diagnosis not present

## 2016-03-05 DIAGNOSIS — M545 Low back pain: Secondary | ICD-10-CM | POA: Diagnosis not present

## 2016-03-09 DIAGNOSIS — Z23 Encounter for immunization: Secondary | ICD-10-CM | POA: Diagnosis not present

## 2016-03-09 DIAGNOSIS — M48062 Spinal stenosis, lumbar region with neurogenic claudication: Secondary | ICD-10-CM | POA: Diagnosis not present

## 2016-03-09 DIAGNOSIS — G894 Chronic pain syndrome: Secondary | ICD-10-CM | POA: Diagnosis not present

## 2016-03-09 DIAGNOSIS — I10 Essential (primary) hypertension: Secondary | ICD-10-CM | POA: Diagnosis not present

## 2016-03-09 DIAGNOSIS — G4700 Insomnia, unspecified: Secondary | ICD-10-CM | POA: Diagnosis not present

## 2016-03-09 DIAGNOSIS — F411 Generalized anxiety disorder: Secondary | ICD-10-CM | POA: Diagnosis not present

## 2016-03-12 ENCOUNTER — Encounter: Payer: Self-pay | Admitting: Internal Medicine

## 2016-03-12 ENCOUNTER — Ambulatory Visit (INDEPENDENT_AMBULATORY_CARE_PROVIDER_SITE_OTHER): Payer: Medicare Other | Admitting: Internal Medicine

## 2016-03-12 ENCOUNTER — Ambulatory Visit (INDEPENDENT_AMBULATORY_CARE_PROVIDER_SITE_OTHER)
Admission: RE | Admit: 2016-03-12 | Discharge: 2016-03-12 | Disposition: A | Discharge status: Home / Self Care | Payer: Medicare Other | Source: Ambulatory Visit | Attending: Internal Medicine | Admitting: Internal Medicine

## 2016-03-12 VITALS — BP 120/76 | HR 103 | Ht 70.0 in | Wt 185.2 lb

## 2016-03-12 DIAGNOSIS — F17210 Nicotine dependence, cigarettes, uncomplicated: Secondary | ICD-10-CM

## 2016-03-12 DIAGNOSIS — Z902 Acquired absence of lung [part of]: Secondary | ICD-10-CM

## 2016-03-12 DIAGNOSIS — J449 Chronic obstructive pulmonary disease, unspecified: Secondary | ICD-10-CM | POA: Diagnosis not present

## 2016-03-12 NOTE — Patient Instructions (Addendum)
You are cleared for surgery but you need to stop smoking completely in the meantime if  At all possible  Use your nebulizer the night before and the morning of surgery   Please remember to go to the  x-ray department downstairs for your tests - we will call you with the results when they are available.

## 2016-03-12 NOTE — Progress Notes (Signed)
Subjective:    Patient ID: Benjamin Case, male    DOB: [birth year],    MRN: [id number]  HPI  43 yowm active smoker s/p L pneumonectormy Burney in 11/2007 for lung ca:  last oncology entry 05/05/15  DIAGNOSIS: Stage IIIA (T2 N2 Mx) non-small-cell lung cancer, squamous cell carcinoma diagnosed in June of 2009.   PRIOR THERAPY:  1. Status post left pneumonectomy with lymph node dissection under the care of Dr. Arlyce Dice on 12/28/2007. 2. Status post 4 cycles of adjuvant chemotherapy with cisplatin and docetaxel, last dose was given 04/29/2008.  CURRENT THERAPY: Observation.    03/12/2016 1st [facility] office visit/ Anjolina Byrer / consultation Re pre op clearance  Chief Complaint  Patient presents with  . Pulmonary Consult    Referred by Dr. Gladstone Lighter. Pt needing pulmonary clearance for back surgery. He has hx of lung CA and has had    doe x MMRC1 = can walk nl pace, flat grade, can't hurry or go uphills or steps s sob on daily nebulizer after supper daily   Able to sleep flat with minimal am cough /mucus scant, white No obvious day to day or daytime variability or assoc excess/ purulent sputum or mucus plugs or hemoptysis or cp or chest tightness, subjective wheeze or overt sinus or hb symptoms. No unusual exp hx or h/o childhood pna/ asthma or knowledge of premature birth.  Sleeping ok without nocturnal  or early am exacerbation  of respiratory  c/o's or need for noct saba. Also denies any obvious fluctuation of symptoms with weather or environmental changes or other aggravating or alleviating factors except as outlined above   Current Medications, Allergies, Complete Past Medical History, Past Surgical History, Family History, and Social History were reviewed in [employer] record.          Review of Systems  Constitutional: Negative for activity change, appetite change, chills, fever and unexpected weight change.  HENT: Negative for congestion, dental  problem, postnasal drip, rhinorrhea, sneezing, sore throat, trouble swallowing and voice change.   Eyes: Negative for visual disturbance.  Respiratory: Positive for shortness of breath. Negative for cough and choking.   Cardiovascular: Negative for chest pain and leg swelling.  Gastrointestinal: Negative for abdominal pain, nausea and vomiting.  Genitourinary: Negative for difficulty urinating.  Musculoskeletal: Negative for arthralgias.  Skin: Negative for rash.  Psychiatric/Behavioral: Negative for behavioral problems and confusion.       Objective:   Physical Exam  Obese wm nad/ very gruff voice  Wt Readings from Last 3 Encounters:  03/12/16 185 lb 3.2 oz (84 kg)  05/05/15 206 lb 11.2 oz (93.8 kg)  05/01/14 200 lb (90.7 kg)    Vital signs reviewed    HEENT: nl dentition, turbinates, and oropharynx. Nl external ear canals without cough reflex   NECK :  without JVD/Nodes/TM/ nl carotid upstrokes bilaterally   LUNGS: no acc muscle use,  Nl contour chest with minimal insp/ exp rhonchi / absent bs on L    CV:  RRR  no s3 or murmur or increase in P2, no edema   ABD:  Obese/ soft and nontender with nl inspiratory excursion in the supine position. No bruits or organomegaly, bowel sounds nl  MS:  Nl gait/ ext warm without deformities, calf tenderness, cyanosis or clubbing No obvious joint restrictions   SKIN: warm and dry without lesions    NEURO:  alert, approp, nl sensorium with  no motor deficits     CXR PA  and Lateral:   03/12/2016 :    I personally reviewed images and agree with radiology impression as follows:    Stable post left pneumonectomy changes. Hyperinflation of the right lung consistent with COPD. Aortic atherosclerosis.     Assessment & Plan:

## 2016-03-14 DIAGNOSIS — J449 Chronic obstructive pulmonary disease, unspecified: Secondary | ICD-10-CM | POA: Insufficient documentation

## 2016-03-14 DIAGNOSIS — F17210 Nicotine dependence, cigarettes, uncomplicated: Secondary | ICD-10-CM | POA: Insufficient documentation

## 2016-03-14 NOTE — Assessment & Plan Note (Signed)
Spirometry 03/12/2016  FEV1 1.35 (38%)  Ratio 69 s post bronchodilator study and  Minimal curvative of f/v loop   His lung functions is surprisingly good pre-bronchodilator and considering he is s/p L pneumonectomy though he still technically meet the criteria for GOLD III severity he has minimal symptoms and no flarees typical of a GROUP A pt for whom there is no "line in the sand" where FEV1 precludes general anesthesia.   rec  rx is sama/saba prn and work on quit smoking (see separate a/p)   Other than stop smoking ideally x 2 weeks preop for elective surgery, I would also re periop duonebs qid  He is cleared for surgery with moderate risk of post op complications he can reduce by smoking x 2weeks.  He accepts the risk and says he can't live with his back like it is but the risks due include post op resp failure and pneumonia.

## 2016-03-14 NOTE — Assessment & Plan Note (Signed)
He has more restrictive than obstructive features to his spirometry but these are not treatable nor are they prohibitive for surgery as he is exceptionally well compensated at baseline.

## 2016-03-14 NOTE — Assessment & Plan Note (Signed)
>   3 min discussion I reviewed the Fletcher curve with the patient that basically indicates  if you quit smoking when your best day FEV1 is still well preserved (as is still surprisingly   the case here)  it is highly unlikely you will progress to severe disease and informed the patient there was  no medication on the market that has proven to alter the curve/ its downward trajectory  or the likelihood of progression of their disease(unlike other chronic medical conditions such as atheroclerosis where we do think we can change the natural hx with risk reducing meds)    Therefore stopping smoking and maintaining abstinence is the most important aspect of care, not choice of inhalers or for that matter, doctors.

## 2016-03-15 ENCOUNTER — Telehealth: Payer: Self-pay | Admitting: Internal Medicine

## 2016-03-15 NOTE — Progress Notes (Signed)
lmtcb

## 2016-03-15 NOTE — Telephone Encounter (Signed)
Pt aware of CXR results. Pt aware & voiced understanding.  Nothing further needed.

## 2016-03-17 NOTE — Progress Notes (Signed)
Surgery on 10/18.  Preop on 03/22/16 at 0900am.  Need orders in EPIC.  Thank You.

## 2016-03-22 ENCOUNTER — Encounter (HOSPITAL_COMMUNITY): Payer: Self-pay

## 2016-03-22 ENCOUNTER — Encounter (HOSPITAL_COMMUNITY)
Admission: RE | Admit: 2016-03-22 | Discharge: 2016-03-22 | Disposition: A | Discharge status: Home / Self Care | Payer: Medicare Other | Source: Ambulatory Visit | Attending: Orthopedic Surgery | Admitting: Orthopedic Surgery

## 2016-03-22 ENCOUNTER — Ambulatory Visit (HOSPITAL_COMMUNITY)
Admission: RE | Admit: 2016-03-22 | Discharge: 2016-03-22 | Disposition: A | Discharge status: Home / Self Care | Payer: Medicare Other | Source: Ambulatory Visit | Attending: Surgical | Admitting: Surgical

## 2016-03-22 ENCOUNTER — Encounter (INDEPENDENT_AMBULATORY_CARE_PROVIDER_SITE_OTHER): Payer: Self-pay

## 2016-03-22 DIAGNOSIS — Z01818 Encounter for other preprocedural examination: Secondary | ICD-10-CM | POA: Insufficient documentation

## 2016-03-22 DIAGNOSIS — I10 Essential (primary) hypertension: Secondary | ICD-10-CM | POA: Insufficient documentation

## 2016-03-22 DIAGNOSIS — M5136 Other intervertebral disc degeneration, lumbar region: Secondary | ICD-10-CM | POA: Insufficient documentation

## 2016-03-22 DIAGNOSIS — I700 Atherosclerosis of aorta: Secondary | ICD-10-CM | POA: Insufficient documentation

## 2016-03-22 DIAGNOSIS — M47816 Spondylosis without myelopathy or radiculopathy, lumbar region: Secondary | ICD-10-CM | POA: Diagnosis not present

## 2016-03-22 DIAGNOSIS — M545 Low back pain, unspecified: Secondary | ICD-10-CM

## 2016-03-22 HISTORY — DX: Personal history of antineoplastic chemotherapy: Z92.21

## 2016-03-22 HISTORY — DX: Anesthesia of skin: R20.0

## 2016-03-22 HISTORY — DX: Inflammatory liver disease, unspecified: K75.9

## 2016-03-22 HISTORY — DX: Dyspnea, unspecified: R06.00

## 2016-03-22 LAB — CBC WITH DIFFERENTIAL/PLATELET
Basophils Absolute: 0.1 10*3/uL (ref 0.0–0.1)
Basophils Relative: 0 %
Eosinophils Absolute: 0.2 10*3/uL (ref 0.0–0.7)
Eosinophils Relative: 2 %
HCT: 42.3 % (ref 39.0–52.0)
Hemoglobin: 15.3 g/dL (ref 13.0–17.0)
Lymphocytes Relative: 22 %
Lymphs Abs: 2.9 10*3/uL (ref 0.7–4.0)
MCH: 35.3 pg — ABNORMAL HIGH (ref 26.0–34.0)
MCHC: 36.2 g/dL — ABNORMAL HIGH (ref 30.0–36.0)
MCV: 97.7 fL (ref 78.0–100.0)
Monocytes Absolute: 1.1 10*3/uL — ABNORMAL HIGH (ref 0.1–1.0)
Monocytes Relative: 9 %
Neutro Abs: 8.8 10*3/uL — ABNORMAL HIGH (ref 1.7–7.7)
Neutrophils Relative %: 67 %
Platelets: 393 10*3/uL (ref 150–400)
RBC: 4.33 MIL/uL (ref 4.22–5.81)
RDW: 12 % (ref 11.5–15.5)
WBC: 13.1 10*3/uL — ABNORMAL HIGH (ref 4.0–10.5)

## 2016-03-22 LAB — COMPREHENSIVE METABOLIC PANEL
ALT: 24 U/L (ref 17–63)
AST: 30 U/L (ref 15–41)
Albumin: 4.1 g/dL (ref 3.5–5.0)
Alkaline Phosphatase: 47 U/L (ref 38–126)
Anion gap: 10 (ref 5–15)
BUN: 7 mg/dL (ref 6–20)
CO2: 27 mmol/L (ref 22–32)
Calcium: 9.5 mg/dL (ref 8.9–10.3)
Chloride: 96 mmol/L — ABNORMAL LOW (ref 101–111)
Creatinine, Ser: 1.14 mg/dL (ref 0.61–1.24)
GFR calc Af Amer: >60 mL/min (ref >60)
GFR calc non Af Amer: >60 mL/min (ref >60)
Glucose, Bld: 137 mg/dL — ABNORMAL HIGH (ref 65–99)
Potassium: 3.9 mmol/L (ref 3.5–5.1)
Sodium: 133 mmol/L — ABNORMAL LOW (ref 135–145)
Total Bilirubin: 0.7 mg/dL (ref 0.3–1.2)
Total Protein: 7.2 g/dL (ref 6.5–8.1)

## 2016-03-22 LAB — PROTIME-INR
INR: 0.86
Prothrombin Time: 11.7 seconds (ref 11.4–15.2)

## 2016-03-22 LAB — SURGICAL PCR SCREEN
MRSA, PCR: NEGATIVE
STAPHYLOCOCCUS AUREUS: NEGATIVE

## 2016-03-22 LAB — ABO/RH: ABO/RH(D): O POS

## 2016-03-22 NOTE — Patient Instructions (Signed)
Benjamin Case  03/22/2016   Your procedure is scheduled on: Wednesday March 24, 2016  Report to [facility] Main  Entrance take [place]  elevators to 3rd floor to  [place] at 11:30 AM.  Call this number if you have problems the morning of surgery [phone]   Remember: ONLY 1 PERSON MAY GO WITH YOU TO SHORT STAY TO GET  READY MORNING OF [place].  Do not eat food After Midnight but may take clear liquids till 7:30 am day of surgery then nothing by mouth.      Take these medicines the morning of surgery with A SIP OF WATER: Alprazolam; Esomeprazole (Nexium) if needed; May take hydrocodone-acetminophen if needed; May take nebulizer treatment if needed                                 You may not have any metal on your body including hair pins and              piercings  Do not wear jewelry,  lotions, powders or colognes, deodorant                        Men may shave face and neck.   Do not bring valuables to the hospital. [place].  Contacts, dentures or bridgework may not be worn into surgery.  Leave suitcase in the car. After surgery it may be brought to your room.   Special Instructions: NO SMOKING 24 HOURS PRIOR TO SURGICAL PROCEDURE DATE               Please read over the following fact sheets you were given:INCENTIVE SPIROMETER; BLOOD TRANSFUSION INFORMATION SHEET  _____________________________________________________________________             [facility] - Preparing for Surgery Before surgery, you can play an important role.  Because skin is not sterile, your skin needs to be as free of germs as possible.  You can reduce the number of germs on your skin by washing with CHG (chlorahexidine gluconate) soap before surgery.  CHG is an antiseptic cleaner which kills germs and bonds with the skin to continue killing germs even after washing. Please DO NOT use if you have an allergy to CHG or  antibacterial soaps.  If your skin becomes reddened/irritated stop using the CHG and inform your nurse when you arrive at Short Stay. Do not shave (including legs and underarms) for at least 48 hours prior to the first CHG shower.  You may shave your face/neck. Please follow these instructions carefully:  1.  Shower with CHG Soap the night before surgery and the  morning of Surgery.  2.  If you choose to wash your hair, wash your hair first as usual with your  normal  shampoo.  3.  After you shampoo, rinse your hair and body thoroughly to remove the  shampoo.                           4.  Use CHG as you would any other liquid soap.  You can apply chg directly  to the skin and wash  Gently with a scrungie or clean washcloth.  5.  Apply the CHG Soap to your body ONLY FROM THE NECK DOWN.   Do not use on face/ open                           Wound or open sores. Avoid contact with eyes, ears mouth and genitals (private parts).                       Wash face,  Genitals (private parts) with your normal soap.             6.  Wash thoroughly, paying special attention to the area where your surgery  will be performed.  7.  Thoroughly rinse your body with warm water from the neck down.  8.  DO NOT shower/wash with your normal soap after using and rinsing off  the CHG Soap.                9.  Pat yourself dry with a clean towel.            10.  Wear clean pajamas.            11.  Place clean sheets on your bed the night of your first shower and do not  sleep with pets. Day of Surgery : Do not apply any lotions/deodorants the morning of surgery.  Please wear clean clothes to the hospital/surgery center.  FAILURE TO FOLLOW THESE INSTRUCTIONS MAY RESULT IN THE CANCELLATION OF YOUR SURGERY PATIENT SIGNATURE_________________________________  NURSE SIGNATURE__________________________________  ________________________________________________________________________    CLEAR LIQUID  DIET   Foods Allowed                                                                     Foods Excluded  Coffee and tea, regular and decaf                             liquids that you cannot  Plain Jell-O in any flavor                                             see through such as: Fruit ices (not with fruit pulp)                                     milk, soups, orange juice  Iced Popsicles                                    All solid food Carbonated beverages, regular and diet                                    Cranberry, grape and apple juices Sports drinks like Gatorade Lightly seasoned clear broth or consume(fat free) Sugar, honey syrup  Sample Menu Breakfast                                Lunch                                     Supper Cranberry juice                    Beef broth                            Chicken broth Jell-O                                     Grape juice                           Apple juice Coffee or tea                        Jell-O                                      Popsicle                                                Coffee or tea                        Coffee or tea  _____________________________________________________________________    Incentive Spirometer  An incentive spirometer is a tool that can help keep your lungs clear and active. This tool measures how well you are filling your lungs with each breath. Taking long deep breaths may help reverse or decrease the chance of developing breathing (pulmonary) problems (especially infection) following:  A long period of time when you are unable to move or be active. BEFORE THE PROCEDURE   If the spirometer includes an indicator to show your best effort, your nurse or respiratory therapist will set it to a desired goal.  If possible, sit up straight or lean slightly forward. Try not to slouch.  Hold the incentive spirometer in an upright position. INSTRUCTIONS FOR USE  1. Sit on the edge of  your bed if possible, or sit up as far as you can in bed or on a chair. 2. Hold the incentive spirometer in an upright position. 3. Breathe out normally. 4. Place the mouthpiece in your mouth and seal your lips tightly around it. 5. Breathe in slowly and as deeply as possible, raising the piston or the ball toward the top of the column. 6. Hold your breath for 3-5 seconds or for as long as possible. Allow the piston or ball to fall to the bottom of the column. 7. Remove the mouthpiece from your mouth and breathe out normally. 8. Rest for a few seconds and repeat Steps 1 through 7 at least 10 times every 1-2 hours when you are awake. Take your time and take a few normal breaths between  deep breaths. 9. The spirometer may include an indicator to show your best effort. Use the indicator as a goal to work toward during each repetition. 10. After each set of 10 deep breaths, practice coughing to be sure your lungs are clear. If you have an incision (the cut made at the time of surgery), support your incision when coughing by placing a pillow or rolled up towels firmly against it. Once you are able to get out of bed, walk around indoors and cough well. You may stop using the incentive spirometer when instructed by your caregiver.  RISKS AND COMPLICATIONS  Take your time so you do not get dizzy or light-headed.  If you are in pain, you may need to take or ask for pain medication before doing incentive spirometry. It is harder to take a deep breath if you are having pain. AFTER USE  Rest and breathe slowly and easily.  It can be helpful to keep track of a log of your progress. Your caregiver can provide you with a simple table to help with this. If you are using the spirometer at home, follow these instructions: [place] IF:   You are having difficultly using the spirometer.  You have trouble using the spirometer as often as instructed.  Your pain medication is not giving enough relief  while using the spirometer.  You develop fever of 100.5 F (38.1 C) or higher. SEEK IMMEDIATE MEDICAL CARE IF:   You cough up bloody sputum that had not been present before.  You develop fever of 102 F (38.9 C) or greater.  You develop worsening pain at or near the incision site. MAKE SURE YOU:   Understand these instructions.  Will watch your condition.  Will get help right away if you are not doing well or get worse. Document Released: 10/04/2006 Document Revised: 08/16/2011 Document Reviewed: 12/05/2006 Exit[employer] Patient Information 2014 Exit[employer], [place].   ________________________________________________________________________  WHAT IS A BLOOD TRANSFUSION? Blood Transfusion Information  A transfusion is the replacement of blood or some of its parts. Blood is made up of multiple cells which provide different functions.  Red blood cells carry oxygen and are used for blood loss replacement.  White blood cells fight against infection.  Platelets control bleeding.  Plasma helps clot blood.  Other blood products are available for specialized needs, such as hemophilia or other clotting disorders. BEFORE THE TRANSFUSION  Who gives blood for transfusions?   Healthy volunteers who are fully evaluated to make sure their blood is safe. This is blood bank blood. Transfusion therapy is the safest it has ever been in the practice of medicine. Before blood is taken from a donor, a complete history is taken to make sure that person has no history of diseases nor engages in risky social behavior (examples are intravenous drug use or sexual activity with multiple partners). The donor's travel history is screened to minimize risk of transmitting infections, such as malaria. The donated blood is tested for signs of infectious diseases, such as HIV and hepatitis. The blood is then tested to be sure it is compatible with you in order to minimize the chance of a transfusion reaction. If you or  a relative donates blood, this is often done in anticipation of surgery and is not appropriate for emergency situations. It takes many days to process the donated blood. RISKS AND COMPLICATIONS Although transfusion therapy is very safe and saves many lives, the main dangers of transfusion include:   Getting an infectious disease.  Developing a transfusion reaction. This is an allergic reaction to something in the blood you were given. Every precaution is taken to prevent this. The decision to have a blood transfusion has been considered carefully by your caregiver before blood is given. Blood is not given unless the benefits outweigh the risks. AFTER THE TRANSFUSION  Right after receiving a blood transfusion, you will usually feel much better and more energetic. This is especially true if your red blood cells have gotten low (anemic). The transfusion raises the level of the red blood cells which carry oxygen, and this usually causes an energy increase.  The nurse administering the transfusion will monitor you carefully for complications. HOME CARE INSTRUCTIONS  No special instructions are needed after a transfusion. You may find your energy is better. Speak with your caregiver about any limitations on activity for underlying diseases you may have. SEEK MEDICAL CARE IF:   Your condition is not improving after your transfusion.  You develop redness or irritation at the intravenous (IV) site. SEEK IMMEDIATE MEDICAL CARE IF:  Any of the following symptoms occur over the next 12 hours:  Shaking chills.  You have a temperature by mouth above 102 F (38.9 C), not controlled by medicine.  Chest, back, or muscle pain.  People around you feel you are not acting correctly or are confused.  Shortness of breath or difficulty breathing.  Dizziness and fainting.  You get a rash or develop hives.  You have a decrease in urine output.  Your urine turns a dark color or changes to pink, red, or  brown. Any of the following symptoms occur over the next 10 days:  You have a temperature by mouth above 102 F (38.9 C), not controlled by medicine.  Shortness of breath.  Weakness after normal activity.  The white part of the eye turns yellow (jaundice).  You have a decrease in the amount of urine or are urinating less often.  Your urine turns a dark color or changes to pink, red, or brown. Document Released: 05/21/2000 Document Revised: 08/16/2011 Document Reviewed: 01/08/2008 [facility] Patient Information 2014 [place], [place].  _______________________________________________________________________

## 2016-03-22 NOTE — Progress Notes (Signed)
Clearance note per chart per Dr Melvyn Novas 03/12/2016

## 2016-03-23 NOTE — H&P (Signed)
Benjamin Case is an 60 y.o. male.   Chief Complaint: low back pain HPI: Benjamin Case presents with a chief complaint of low back pain. He has been seen previously for his low back and did have a hemilaminectomy and microdiscectomy by Dr. Gladstone Lighter back in 2007. He says that few weeks ago, he was doing some work when he felt a pop in his back. Since then, he has had pain. No pain down his legs. 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 Still has some discomfort in regards to that, but no relation to his back pain. He has been having pain into his legs even at rest. No change in bladder or bowel function. No groin pain. He is on pain medication. He takes hydrocodone 7.5, which he gets from his primary care doctor. He says that this is not helping his discomfort. He did not see any relief with oral corticosteroids or rest. CT myelogram revealed severe spinal stenosis L3-L4.   Past Medical History:  Diagnosis Date  . Dyspnea    increased exertion; pt states can climb flight of stairs w/o having to stop prior to reaching top   . Hepatitis    hepatitis C; pt states competed treatments  . History of chemotherapy   . Hypertension   . lung ca dx'd 12/2007   lt lobectomy  . Numbness    FEET BILATERAL    Past Surgical History:  Procedure Laterality Date  . LOBECTOMY Left 2009    Social History:  reports that he has been smoking.  He has a 22.50 pack-year smoking history. He has never used smokeless tobacco. He reports that he drinks alcohol. He reports that he does not use drugs.  Allergies: No Known Allergies  Current Outpatient Prescriptions:  .  ALPRAZolam (XANAX) 1 MG tablet, Take 1 mg by mouth 2 (two) times daily., Disp: , Rfl: 5 .  Ascorbic Acid (VITAMIN C) 1000 MG tablet, Take 1,000 mg by mouth daily., Disp: , Rfl:  .  esomeprazole (NEXIUM) 40 MG capsule, Take 40 mg by mouth daily as needed (for heartburn). , Disp: , Rfl:  .   HYDROcodone-acetaminophen (NORCO) 7.5-325 MG per tablet, Take 1 tablet by mouth every 6 (six) hours as needed. From Dr Alessandra Grout [facility]  .  ipratropium-albuterol (DUONEB) 0.5-2.5 (3) MG/3ML SOLN, Take 3 mLs by nebulization every 4 (four) hours as needed (for shortness of breath/wheezing.). , Disp: , Rfl:  .  SUPER B COMPLEX/C PO, Take 1 capsule by mouth daily. , Disp: , Rfl:   Results for orders placed or performed during the hospital encounter of 03/22/16 (from the past 50 hour(s))  Surgical pcr screen     Status: None   Collection Time: 03/22/16  9:17 AM  Result Value Ref Range   MRSA, PCR NEGATIVE NEGATIVE   Staphylococcus aureus NEGATIVE NEGATIVE    Comment:        The Xpert SA Assay (FDA approved for NASAL specimens in patients over 87 years of age), is one component of a comprehensive surveillance program.  Test performance has been validated by [facility] for patients greater than or equal to 24 year old. It is not intended to diagnose infection nor to guide or monitor treatment.   CBC WITH DIFFERENTIAL     Status: Abnormal   Collection Time: 03/22/16  9:30 AM  Result Value Ref Range   WBC 13.1 (H) 4.0 - 10.5  K/uL   RBC 4.33 4.22 - 5.81 MIL/uL   Hemoglobin 15.3 13.0 - 17.0 g/dL   HCT 42.3 39.0 - 52.0 %   MCV 97.7 78.0 - 100.0 fL   MCH 35.3 (H) 26.0 - 34.0 pg   MCHC 36.2 (H) 30.0 - 36.0 g/dL   RDW 12.0 11.5 - 15.5 %   Platelets 393 150 - 400 K/uL   Neutrophils Relative % 67 %   Neutro Abs 8.8 (H) 1.7 - 7.7 K/uL   Lymphocytes Relative 22 %   Lymphs Abs 2.9 0.7 - 4.0 K/uL   Monocytes Relative 9 %   Monocytes Absolute 1.1 (H) 0.1 - 1.0 K/uL   Eosinophils Relative 2 %   Eosinophils Absolute 0.2 0.0 - 0.7 K/uL   Basophils Relative 0 %   Basophils Absolute 0.1 0.0 - 0.1 K/uL  Comprehensive metabolic panel     Status: Abnormal   Collection Time: 03/22/16  9:30 AM  Result Value Ref Range   Sodium 133 (L) 135 - 145 mmol/L   Potassium 3.9 3.5 - 5.1  mmol/L   Chloride 96 (L) 101 - 111 mmol/L   CO2 27 22 - 32 mmol/L   Glucose, Bld 137 (H) 65 - 99 mg/dL   BUN 7 6 - 20 mg/dL   Creatinine, Ser 1.14 0.61 - 1.24 mg/dL   Calcium 9.5 8.9 - 10.3 mg/dL   Total Protein 7.2 6.5 - 8.1 g/dL   Albumin 4.1 3.5 - 5.0 g/dL   AST 30 15 - 41 U/L   ALT 24 17 - 63 U/L   Alkaline Phosphatase 47 38 - 126 U/L   Total Bilirubin 0.7 0.3 - 1.2 mg/dL   GFR calc non Af Amer >60 >60 mL/min   GFR calc Af Amer >60 >60 mL/min    Comment: (NOTE) The eGFR has been calculated using the CKD EPI equation. This calculation has not been validated in all clinical situations. eGFR's persistently <60 mL/min signify possible Chronic Kidney Disease.    Anion gap 10 5 - 15  Protime-INR     Status: None   Collection Time: 03/22/16  9:30 AM  Result Value Ref Range   Prothrombin Time 11.7 11.4 - 15.2 seconds   INR 0.86   Type and screen Order type and screen if day of surgery is less than 15 days from draw of preadmission visit or order morning of surgery if day of surgery is greater than 6 days from preadmission visit.     Status: None   Collection Time: 03/22/16  9:30 AM  Result Value Ref Range   ABO/RH(D) O POS    Antibody Screen NEG    Sample Expiration 04/05/2016    Extend sample reason NO TRANSFUSIONS OR PREGNANCY IN THE PAST 3 MONTHS   ABO/Rh     Status: None   Collection Time: 03/22/16  9:30 AM  Result Value Ref Range   ABO/RH(D) O POS    Dg Lumbar Spine 2-3 Views  Result Date: 03/22/2016 CLINICAL DATA:  Lumbar spine surgery. EXAM: LUMBAR SPINE - 2-3 VIEW COMPARISON:  CT 01/22/2016. FINDINGS: Lumbar spine numbered as per prior MRI. No acute bony abnormality identified. Diffuse multilevel degenerative change. Aortic atherosclerotic vascular calcification. IMPRESSION: 1. Diffuse multilevel degenerative change. 2. Aortic atherosclerotic vascular disease. Electronically Signed   By: Marcello Moores  Register   On: 03/22/2016 10:35    Review of Systems  Constitutional:  Negative.   HENT: Negative.   Eyes: Negative.   Respiratory: Positive for shortness  of breath. Negative for cough, hemoptysis, sputum production and wheezing.   Cardiovascular: Negative.   Gastrointestinal: Positive for heartburn. Negative for abdominal pain, blood in stool, constipation, diarrhea, melena, nausea and vomiting.  Genitourinary: Negative.   Musculoskeletal: Positive for back pain and myalgias. Negative for falls, joint pain and neck pain.  Skin: Negative.   Neurological: Positive for tingling. Negative for dizziness, tremors, sensory change, speech change, focal weakness, seizures and loss of consciousness.  Endo/Heme/Allergies: Negative.   Psychiatric/Behavioral: Negative.    Vitals Weight: 192.04 lb Height: 69.5in Body Surface Area: 2.04 m Body Mass Index: 27.95 kg/m  BP: 140/86 (Sitting, Left Arm, Standard) HR: 80 bpm   Physical Exam  Constitutional: He is oriented to person, place, and time. He appears well-developed and well-nourished. No distress.  HENT:  Head: Normocephalic and atraumatic.  Right Ear: External ear normal.  Left Ear: External ear normal.  Nose: Nose normal.  Mouth/Throat: Oropharynx is clear and moist.  Eyes: Conjunctivae and EOM are normal.  Neck: Normal range of motion. Neck supple.  Cardiovascular: Normal rate, regular rhythm, normal heart sounds and intact distal pulses.   Respiratory: Effort normal. No respiratory distress. He has decreased breath sounds in the left upper field, the left middle field and the left lower field. He has no wheezes.  GI: Soft. Bowel sounds are normal. He exhibits no distension. There is no tenderness.  Musculoskeletal:       Right hip: Normal.       Left hip: Normal.       Right knee: Normal.       Left knee: Normal.       Lumbar back: He exhibits decreased range of motion, pain and spasm.  Neurological: He is alert and oriented to person, place, and time. He has normal strength. No sensory deficit.   Skin: No rash noted. He is not diaphoretic. No erythema.  Psychiatric: He has a normal mood and affect. His behavior is normal.     Assessment/Plan Lumbar spinal stenosis L3-L4 He needs a central decompression lumbar laminecteomy L3-L4. Risks and benefits of the procedure discussed with the patient by Dr. Latanya Maudlin, MD. The possible complications of spinal surgery number one could be infection, which is extremely rare. We do use antibiotics prior to the surgery and during surgery and after surgery. Number two is always a slight degree of probability that you could develop a blood clot in your leg after any type of surgery and we try our best to prevent that with aspirin post op when it is safe to begin. The third is a dural leak. That is the spinal fluid leak that could occur. At certain rare times the bone or the disc could literally stick to the dura which is the lining which contains the spinal fluid and we could develop a small tear in that lining which we then patch up. That is an extremely rare complication. The last and final complication is a recurrent disc rupture. That means that you could rupture another small piece of disc later on down the road and there is about a 2% chance of that.    H&P performed by Dr. Latanya Maudlin, MD   Harrietta Incorvaia Ander Purpura, PA-C 03/23/2016, 3:29 PM

## 2016-03-24 ENCOUNTER — Encounter (HOSPITAL_COMMUNITY): Payer: Self-pay | Admitting: *Deleted

## 2016-03-24 ENCOUNTER — Ambulatory Visit (HOSPITAL_COMMUNITY): Payer: Medicare Other

## 2016-03-24 ENCOUNTER — Ambulatory Visit (HOSPITAL_COMMUNITY): Payer: Medicare Other | Admitting: Certified Registered Nurse Anesthetist

## 2016-03-24 ENCOUNTER — Observation Stay (HOSPITAL_COMMUNITY)
Admission: RE | Admit: 2016-03-24 | Discharge: 2016-03-25 | Disposition: A | Discharge status: Home / Self Care | Payer: Medicare Other | Source: Ambulatory Visit | Attending: Orthopedic Surgery | Admitting: Orthopedic Surgery

## 2016-03-24 ENCOUNTER — Encounter (HOSPITAL_COMMUNITY)
Admission: RE | Disposition: A | Discharge status: Home / Self Care | Payer: Self-pay | Source: Ambulatory Visit | Attending: Orthopedic Surgery

## 2016-03-24 DIAGNOSIS — Z9221 Personal history of antineoplastic chemotherapy: Secondary | ICD-10-CM | POA: Insufficient documentation

## 2016-03-24 DIAGNOSIS — R202 Paresthesia of skin: Secondary | ICD-10-CM | POA: Insufficient documentation

## 2016-03-24 DIAGNOSIS — B1920 Unspecified viral hepatitis C without hepatic coma: Secondary | ICD-10-CM | POA: Diagnosis not present

## 2016-03-24 DIAGNOSIS — I10 Essential (primary) hypertension: Secondary | ICD-10-CM | POA: Diagnosis not present

## 2016-03-24 DIAGNOSIS — F17210 Nicotine dependence, cigarettes, uncomplicated: Secondary | ICD-10-CM | POA: Diagnosis not present

## 2016-03-24 DIAGNOSIS — M5136 Other intervertebral disc degeneration, lumbar region: Secondary | ICD-10-CM | POA: Diagnosis not present

## 2016-03-24 DIAGNOSIS — M48062 Spinal stenosis, lumbar region with neurogenic claudication: Principal | ICD-10-CM | POA: Insufficient documentation

## 2016-03-24 DIAGNOSIS — R0602 Shortness of breath: Secondary | ICD-10-CM | POA: Insufficient documentation

## 2016-03-24 DIAGNOSIS — Z85118 Personal history of other malignant neoplasm of bronchus and lung: Secondary | ICD-10-CM | POA: Diagnosis not present

## 2016-03-24 DIAGNOSIS — M5489 Other dorsalgia: Secondary | ICD-10-CM | POA: Diagnosis not present

## 2016-03-24 DIAGNOSIS — Z419 Encounter for procedure for purposes other than remedying health state, unspecified: Secondary | ICD-10-CM

## 2016-03-24 HISTORY — PX: DECOMPRESSIVE LUMBAR LAMINECTOMY LEVEL 1: SHX5791

## 2016-03-24 LAB — TYPE AND SCREEN
ABO/RH(D): O POS
Antibody Screen: NEGATIVE

## 2016-03-24 SURGERY — DECOMPRESSIVE LUMBAR LAMINECTOMY LEVEL 1
Anesthesia: General | Site: Back

## 2016-03-24 MED ORDER — BACITRACIN-NEOMYCIN-POLYMYXIN 400-5-5000 EX OINT
TOPICAL_OINTMENT | CUTANEOUS | Status: AC
  Filled 2016-03-24: qty 1

## 2016-03-24 MED ORDER — PHENOL 1.4 % MT LIQD
1.0000 | OROMUCOSAL | Status: DC | PRN
Start: 2016-03-24 — End: 2016-03-25

## 2016-03-24 MED ORDER — PROPOFOL 10 MG/ML IV BOLUS
INTRAVENOUS | Status: AC
  Filled 2016-03-24: qty 20

## 2016-03-24 MED ORDER — SUGAMMADEX SODIUM 200 MG/2ML IV SOLN
INTRAVENOUS | Status: AC
  Filled 2016-03-24: qty 2

## 2016-03-24 MED ORDER — PROPOFOL 10 MG/ML IV BOLUS
INTRAVENOUS | Status: DC | PRN
  Administered 2016-03-24: 200 mg via INTRAVENOUS
  Administered 2016-03-24: 40 mg via INTRAVENOUS

## 2016-03-24 MED ORDER — BUPIVACAINE HCL (PF) 0.5 % IJ SOLN
INTRAMUSCULAR | Status: AC
  Filled 2016-03-24: qty 30

## 2016-03-24 MED ORDER — BUPIVACAINE HCL (PF) 0.5 % IJ SOLN
INTRAMUSCULAR | Status: DC | PRN
  Administered 2016-03-24: 20 mL

## 2016-03-24 MED ORDER — ACETAMINOPHEN 325 MG PO TABS: 650.0000 mg | ORAL_TABLET | ORAL | Status: DC | PRN

## 2016-03-24 MED ORDER — FENTANYL CITRATE (PF) 100 MCG/2ML IJ SOLN
INTRAMUSCULAR | Status: DC | PRN
  Administered 2016-03-24: 100 ug via INTRAVENOUS
  Administered 2016-03-24 (×4): 50 ug via INTRAVENOUS

## 2016-03-24 MED ORDER — MEPERIDINE HCL 50 MG/ML IJ SOLN: 6.2500 mg | INTRAMUSCULAR | Status: DC | PRN

## 2016-03-24 MED ORDER — FENTANYL CITRATE (PF) 100 MCG/2ML IJ SOLN
INTRAMUSCULAR | Status: AC
  Filled 2016-03-24: qty 2

## 2016-03-24 MED ORDER — METHOCARBAMOL 1000 MG/10ML IJ SOLN
500.0000 mg | Freq: Four times a day (QID) | INTRAVENOUS | Status: DC | PRN
  Administered 2016-03-24: 500 mg via INTRAVENOUS
  Filled 2016-03-24: qty 5
  Filled 2016-03-24: qty 550

## 2016-03-24 MED ORDER — ROCURONIUM BROMIDE 50 MG/5ML IV SOSY
PREFILLED_SYRINGE | INTRAVENOUS | Status: AC
  Filled 2016-03-24: qty 5

## 2016-03-24 MED ORDER — PANTOPRAZOLE SODIUM 40 MG PO TBEC
40.0000 mg | DELAYED_RELEASE_TABLET | Freq: Every day | ORAL | Status: DC
  Administered 2016-03-25: 40 mg via ORAL
  Filled 2016-03-24: qty 1

## 2016-03-24 MED ORDER — THROMBIN 5000 UNITS EX SOLR
CUTANEOUS | Status: AC
  Filled 2016-03-24: qty 10000

## 2016-03-24 MED ORDER — SUCCINYLCHOLINE CHLORIDE 20 MG/ML IJ SOLN
INTRAMUSCULAR | Status: AC
  Filled 2016-03-24: qty 1

## 2016-03-24 MED ORDER — ONDANSETRON HCL 4 MG/2ML IJ SOLN: 4.0000 mg | Freq: Once | INTRAMUSCULAR | Status: DC | PRN

## 2016-03-24 MED ORDER — HEMOSTATIC AGENTS (NO CHARGE) OPTIME
TOPICAL | Status: DC | PRN
  Administered 2016-03-24: 1 via TOPICAL

## 2016-03-24 MED ORDER — HYDROMORPHONE HCL 1 MG/ML IJ SOLN
INTRAMUSCULAR | Status: AC
  Filled 2016-03-24: qty 1

## 2016-03-24 MED ORDER — IPRATROPIUM-ALBUTEROL 0.5-2.5 (3) MG/3ML IN SOLN: 3.0000 mL | RESPIRATORY_TRACT | Status: DC | PRN

## 2016-03-24 MED ORDER — LACTATED RINGERS IV SOLN: INTRAVENOUS | Status: DC

## 2016-03-24 MED ORDER — BUPIVACAINE LIPOSOME 1.3 % IJ SUSP
INTRAMUSCULAR | Status: DC | PRN
  Administered 2016-03-24: 15 mL

## 2016-03-24 MED ORDER — HYDROMORPHONE HCL 1 MG/ML IJ SOLN
0.5000 mg | INTRAMUSCULAR | Status: DC | PRN
  Administered 2016-03-24: 1 mg via INTRAVENOUS
  Filled 2016-03-24: qty 1

## 2016-03-24 MED ORDER — ALPRAZOLAM 1 MG PO TABS
1.0000 mg | ORAL_TABLET | Freq: Two times a day (BID) | ORAL | Status: DC
  Administered 2016-03-24 – 2016-03-25 (×2): 1 mg via ORAL
  Filled 2016-03-24 (×2): qty 1

## 2016-03-24 MED ORDER — MENTHOL 3 MG MT LOZG: 1.0000 | LOZENGE | OROMUCOSAL | Status: DC | PRN

## 2016-03-24 MED ORDER — SUGAMMADEX SODIUM 200 MG/2ML IV SOLN
INTRAVENOUS | Status: DC | PRN
Start: 2016-03-24 — End: 2016-03-24
  Administered 2016-03-24: 200 mg via INTRAVENOUS

## 2016-03-24 MED ORDER — CEFAZOLIN IN D5W 1 GM/50ML IV SOLN
1.0000 g | Freq: Three times a day (TID) | INTRAVENOUS | Status: DC
  Administered 2016-03-24 – 2016-03-25 (×2): 1 g via INTRAVENOUS
  Filled 2016-03-24 (×3): qty 50

## 2016-03-24 MED ORDER — EPHEDRINE 5 MG/ML INJ
INTRAVENOUS | Status: AC
  Filled 2016-03-24: qty 10

## 2016-03-24 MED ORDER — HYDROMORPHONE HCL 1 MG/ML IJ SOLN
0.2500 mg | INTRAMUSCULAR | Status: DC | PRN
  Administered 2016-03-24 (×4): 0.5 mg via INTRAVENOUS

## 2016-03-24 MED ORDER — ONDANSETRON HCL 4 MG/2ML IJ SOLN: 4.0000 mg | INTRAMUSCULAR | Status: DC | PRN

## 2016-03-24 MED ORDER — HYDROCODONE-ACETAMINOPHEN 7.5-325 MG PO TABS
2.0000 | ORAL_TABLET | Freq: Four times a day (QID) | ORAL | Status: DC | PRN
  Administered 2016-03-24: 2 via ORAL
  Administered 2016-03-24: 1 via ORAL
  Administered 2016-03-25 (×2): 2 via ORAL
  Filled 2016-03-24 (×4): qty 2

## 2016-03-24 MED ORDER — ONDANSETRON HCL 4 MG/2ML IJ SOLN
INTRAMUSCULAR | Status: AC
Start: 2016-03-24 — End: 2016-03-24
  Filled 2016-03-24: qty 2

## 2016-03-24 MED ORDER — FENTANYL CITRATE (PF) 100 MCG/2ML IJ SOLN
INTRAMUSCULAR | Status: AC
  Filled 2016-03-24: qty 4

## 2016-03-24 MED ORDER — FLEET ENEMA 7-19 GM/118ML RE ENEM: 1.0000 | ENEMA | Freq: Once | RECTAL | Status: DC | PRN

## 2016-03-24 MED ORDER — DEXAMETHASONE SODIUM PHOSPHATE 10 MG/ML IJ SOLN
INTRAMUSCULAR | Status: DC | PRN
  Administered 2016-03-24: 10 mg via INTRAVENOUS

## 2016-03-24 MED ORDER — ACETAMINOPHEN 650 MG RE SUPP: 650.0000 mg | RECTAL | Status: DC | PRN

## 2016-03-24 MED ORDER — SUCCINYLCHOLINE CHLORIDE 20 MG/ML IJ SOLN
INTRAMUSCULAR | Status: DC | PRN
  Administered 2016-03-24: 100 mg via INTRAVENOUS

## 2016-03-24 MED ORDER — OXYCODONE HCL 5 MG/5ML PO SOLN: 5.0000 mg | Freq: Once | ORAL | Status: DC | PRN

## 2016-03-24 MED ORDER — MIDAZOLAM HCL 5 MG/5ML IJ SOLN
INTRAMUSCULAR | Status: DC | PRN
  Administered 2016-03-24: 2 mg via INTRAVENOUS

## 2016-03-24 MED ORDER — SODIUM CHLORIDE 0.9 % IR SOLN
Status: AC
  Filled 2016-03-24: qty 500000

## 2016-03-24 MED ORDER — BACITRACIN-NEOMYCIN-POLYMYXIN 400-5-5000 EX OINT
TOPICAL_OINTMENT | CUTANEOUS | Status: DC | PRN
  Administered 2016-03-24: 1 via TOPICAL

## 2016-03-24 MED ORDER — OXYCODONE-ACETAMINOPHEN 5-325 MG PO TABS: 1.0000 | ORAL_TABLET | ORAL | Status: DC | PRN

## 2016-03-24 MED ORDER — LIDOCAINE 2% (20 MG/ML) 5 ML SYRINGE
INTRAMUSCULAR | Status: AC
  Filled 2016-03-24: qty 5

## 2016-03-24 MED ORDER — ONDANSETRON HCL 4 MG/2ML IJ SOLN
INTRAMUSCULAR | Status: DC | PRN
  Administered 2016-03-24: 4 mg via INTRAVENOUS

## 2016-03-24 MED ORDER — CEFAZOLIN SODIUM-DEXTROSE 2-4 GM/100ML-% IV SOLN
2.0000 g | INTRAVENOUS | Status: AC
  Administered 2016-03-24: 2 g via INTRAVENOUS
  Filled 2016-03-24: qty 100

## 2016-03-24 MED ORDER — POLYETHYLENE GLYCOL 3350 17 G PO PACK: 17.0000 g | PACK | Freq: Every day | ORAL | Status: DC | PRN

## 2016-03-24 MED ORDER — LACTATED RINGERS IV SOLN
INTRAVENOUS | Status: DC
  Administered 2016-03-24 (×2): via INTRAVENOUS

## 2016-03-24 MED ORDER — BISACODYL 5 MG PO TBEC: 5.0000 mg | DELAYED_RELEASE_TABLET | Freq: Every day | ORAL | Status: DC | PRN

## 2016-03-24 MED ORDER — OXYCODONE HCL 5 MG PO TABS: 5.0000 mg | ORAL_TABLET | Freq: Once | ORAL | Status: DC | PRN

## 2016-03-24 MED ORDER — MIDAZOLAM HCL 2 MG/2ML IJ SOLN
INTRAMUSCULAR | Status: AC
  Filled 2016-03-24: qty 2

## 2016-03-24 MED ORDER — DEXAMETHASONE SODIUM PHOSPHATE 10 MG/ML IJ SOLN
INTRAMUSCULAR | Status: AC
Start: 2016-03-24 — End: 2016-03-24
  Filled 2016-03-24: qty 1

## 2016-03-24 MED ORDER — LACTATED RINGERS IV SOLN
INTRAVENOUS | Status: DC
  Administered 2016-03-24: 19:00:00 via INTRAVENOUS

## 2016-03-24 MED ORDER — CEFAZOLIN SODIUM-DEXTROSE 2-4 GM/100ML-% IV SOLN
INTRAVENOUS | Status: AC
  Filled 2016-03-24: qty 100

## 2016-03-24 MED ORDER — METHOCARBAMOL 500 MG PO TABS
500.0000 mg | ORAL_TABLET | Freq: Four times a day (QID) | ORAL | Status: DC | PRN
  Administered 2016-03-25 (×2): 500 mg via ORAL
  Filled 2016-03-24 (×2): qty 1

## 2016-03-24 MED ORDER — LIDOCAINE HCL (CARDIAC) 20 MG/ML IV SOLN
INTRAVENOUS | Status: DC | PRN
  Administered 2016-03-24: 100 mg via INTRAVENOUS

## 2016-03-24 MED ORDER — ROCURONIUM BROMIDE 100 MG/10ML IV SOLN
INTRAVENOUS | Status: DC | PRN
  Administered 2016-03-24: 10 mg via INTRAVENOUS
  Administered 2016-03-24: 40 mg via INTRAVENOUS
  Administered 2016-03-24: 10 mg via INTRAVENOUS

## 2016-03-24 MED ORDER — POLYMYXIN B SULFATE 500000 UNITS IJ SOLR
INTRAMUSCULAR | Status: DC | PRN
  Administered 2016-03-24: 500 mL

## 2016-03-24 MED ORDER — BUPIVACAINE LIPOSOME 1.3 % IJ SUSP
20.0000 mL | Freq: Once | INTRAMUSCULAR | Status: DC
  Filled 2016-03-24: qty 20

## 2016-03-24 SURGICAL SUPPLY — 43 items
AGENT HMST SPONGE THK3/8 (HEMOSTASIS) ×1
BAG SPEC THK2 15X12 ZIP CLS (MISCELLANEOUS) ×1
BAG ZIPLOCK 12X15 (MISCELLANEOUS) ×3 IMPLANT
CLEANER TIP ELECTROSURG 2X2 (MISCELLANEOUS) ×3 IMPLANT
DRAPE MICROSCOPE LEICA (MISCELLANEOUS) ×3 IMPLANT
DRAPE POUCH INSTRU U-SHP 10X18 (DRAPES) ×3 IMPLANT
DRAPE SURG 17X11 SM STRL (DRAPES) ×3 IMPLANT
DRSG ADAPTIC 3X8 NADH LF (GAUZE/BANDAGES/DRESSINGS) ×3 IMPLANT
DRSG PAD ABDOMINAL 8X10 ST (GAUZE/BANDAGES/DRESSINGS) ×9 IMPLANT
DURAPREP 26ML APPLICATOR (WOUND CARE) ×3 IMPLANT
ELECT BLADE TIP CTD 4 INCH (ELECTRODE) ×3 IMPLANT
ELECT REM PT RETURN 9FT ADLT (ELECTROSURGICAL) ×3
ELECTRODE REM PT RTRN 9FT ADLT (ELECTROSURGICAL) ×1 IMPLANT
GLOVE BIOGEL PI IND STRL 7.5 (GLOVE) IMPLANT
GLOVE BIOGEL PI IND STRL 8 (GLOVE) ×1 IMPLANT
GLOVE BIOGEL PI INDICATOR 7.5 (GLOVE) ×4
GLOVE BIOGEL PI INDICATOR 8 (GLOVE) ×2
GLOVE ECLIPSE 8.0 STRL XLNG CF (GLOVE) ×3 IMPLANT
GLOVE SURG SS PI 7.5 STRL IVOR (GLOVE) ×2 IMPLANT
GOWN STRL REUS W/ TWL XL LVL3 (GOWN DISPOSABLE) IMPLANT
GOWN STRL REUS W/TWL XL LVL3 (GOWN DISPOSABLE) ×9 IMPLANT
HEMOSTAT SPONGE AVITENE ULTRA (HEMOSTASIS) ×2 IMPLANT
KIT BASIN OR (CUSTOM PROCEDURE TRAY) ×3 IMPLANT
KIT POSITIONING SURG ANDREWS (MISCELLANEOUS) ×3 IMPLANT
MANIFOLD NEPTUNE II (INSTRUMENTS) ×3 IMPLANT
NDL SPNL 18GX3.5 QUINCKE PK (NEEDLE) ×2 IMPLANT
NEEDLE SPNL 18GX3.5 QUINCKE PK (NEEDLE) ×6 IMPLANT
PACK LAMINECTOMY ORTHO (CUSTOM PROCEDURE TRAY) ×3 IMPLANT
PAD ABD 8X10 STRL (GAUZE/BANDAGES/DRESSINGS) ×6 IMPLANT
PATTIES SURGICAL .75X.75 (GAUZE/BANDAGES/DRESSINGS) ×3 IMPLANT
PATTIES SURGICAL 1X1 (DISPOSABLE) ×3 IMPLANT
SPONGE LAP 4X18 X RAY DECT (DISPOSABLE) ×3 IMPLANT
STAPLER VISISTAT 35W (STAPLE) ×3 IMPLANT
SUT BONE WAX W31G (SUTURE) ×2 IMPLANT
SUT VIC AB 0 CT1 27 (SUTURE) ×3
SUT VIC AB 0 CT1 27XBRD ANTBC (SUTURE) ×1 IMPLANT
SUT VIC AB 1 CT1 27 (SUTURE) ×3
SUT VIC AB 1 CT1 27XBRD ANTBC (SUTURE) ×3 IMPLANT
SUT VIC AB 2-0 CT2 27 (SUTURE) ×2 IMPLANT
SYR 20CC LL (SYRINGE) ×3 IMPLANT
TAPE CLOTH SURG 4X10 WHT LF (GAUZE/BANDAGES/DRESSINGS) ×2 IMPLANT
TOWEL OR 17X26 10 PK STRL BLUE (TOWEL DISPOSABLE) ×6 IMPLANT
TRAY FOLEY W/METER SILVER 16FR (SET/KITS/TRAYS/PACK) ×2 IMPLANT

## 2016-03-24 NOTE — Anesthesia Preprocedure Evaluation (Addendum)
Anesthesia Evaluation  Patient identified by MRN, date of birth, ID band Patient awake    Reviewed: Allergy & Precautions, NPO status , Patient's Chart, lab work & pertinent test results  Airway Mallampati: I  TM Distance: >3 FB Neck ROM: Full    Dental  (+) Teeth Intact, Dental Advisory Given   Pulmonary Shortness of breath: S/P Left pneumonectomy 2009 for ca.  No home 02., Current Smoker,    breath sounds clear to auscultation       Cardiovascular hypertension (Pt denies),  Rhythm:Regular Rate:Normal     Neuro/Psych    GI/Hepatic   Endo/Other    Renal/GU      Musculoskeletal   Abdominal   Peds  Hematology   Anesthesia Other Findings   Reproductive/Obstetrics                            Anesthesia Physical Anesthesia Plan  ASA: III  Anesthesia Plan: General   Post-op Pain Management:    Induction: Intravenous  Airway Management Planned: Oral ETT  Additional Equipment:   Intra-op Plan:   Post-operative Plan: Extubation in OR  Informed Consent: I have reviewed the patients History and Physical, chart, labs and discussed the procedure including the risks, benefits and alternatives for the proposed anesthesia with the patient or authorized representative who has indicated his/her understanding and acceptance.   Dental advisory given  Plan Discussed with: CRNA, Anesthesiologist and Surgeon  Anesthesia Plan Comments:         Anesthesia Quick Evaluation

## 2016-03-24 NOTE — Anesthesia Procedure Notes (Signed)
Procedure Name: Intubation Date/Time: 03/24/2016 2:24 PM Performed by: Maxwell Caul Pre-anesthesia Checklist: Patient identified, Emergency Drugs available, Suction available and Patient being monitored Patient Re-evaluated:Patient Re-evaluated prior to inductionOxygen Delivery Method: Circle system utilized Preoxygenation: Pre-oxygenation with 100% oxygen Intubation Type: IV induction Ventilation: Mask ventilation without difficulty Laryngoscope Size: Mac and 4 Grade View: Grade III Tube type: Oral Tube size: 7.5 mm Number of attempts: 1 Airway Equipment and Method: Stylet and Oral airway Placement Confirmation: ETT inserted through vocal cords under direct vision,  positive ETCO2 and breath sounds checked- equal and bilateral Secured at: 22 cm Tube secured with: Tape Dental Injury: Teeth and Oropharynx as per pre-operative assessment

## 2016-03-24 NOTE — Transfer of Care (Signed)
Immediate Anesthesia Transfer of Care Note  Patient: Benjamin Case  Procedure(s) Performed: Procedure(s): L3-4 CENTRAL DECOMPRESSION LUMBER LAMINECTOMY (N/A)  Patient Location: PACU  Anesthesia Type:General  Level of Consciousness: awake, alert , oriented and patient cooperative  Airway & Oxygen Therapy: Patient Spontanous Breathing and Patient connected to face mask oxygen  Post-op Assessment: Report given to RN, Post -op Vital signs reviewed and stable and Patient moving all extremities X 4  Post vital signs: stable  Last Vitals:  Vitals:   03/24/16 1149 03/24/16 1622  BP: (!) 143/79 128/65  Pulse: 89 70  Resp: 18   Temp: 36.7 C 36.5 C    Last Pain:  Vitals:   03/24/16 1340  TempSrc:   PainSc: 8       Patients Stated Pain Goal: 4 (77/82/42 3536)  Complications: No apparent anesthesia complications

## 2016-03-24 NOTE — Interval H&P Note (Signed)
History and Physical Interval Note:  03/24/2016 2:21 PM  Benjamin Case  has presented today for surgery, with the diagnosis of LUMBER SPINAL STENOSIS L3-4  The various methods of treatment have been discussed with the patient and family. After consideration of risks, benefits and other options for treatment, the patient has consented to  Procedure(s): L3-4 [place] (N/A) as a surgical intervention .  The patient's history has been reviewed, patient examined, no change in status, stable for surgery.  I have reviewed the patient's chart and labs.  Questions were answered to the patient's satisfaction.     Surie Suchocki A

## 2016-03-24 NOTE — Anesthesia Postprocedure Evaluation (Signed)
Anesthesia Post Note  Patient: Benjamin Case  Procedure(s) Performed: Procedure(s) (LRB): L3-4 CENTRAL DECOMPRESSION LUMBER LAMINECTOMY (N/A)  Anesthesia Post Evaluation  Last Vitals:  Vitals:   03/24/16 1715 03/24/16 1730  BP: 105/68 124/71  Pulse: 64 65  Resp: (!) 9 12  Temp: 36.6 C 36.7 C    Last Pain:  Vitals:   03/24/16 1715  TempSrc:   PainSc: 4                  [name] A

## 2016-03-24 NOTE — Brief Op Note (Signed)
03/24/2016  3:58 PM  PATIENT:  Benjamin Case  60 y.o. 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  POST-OPERATIVE DIAGNOSIS:  Same as Pre-Op  PROCEDURE:  Procedure(s): L3-4 CENTRAL DECOMPRESSION LUMBER LAMINECTOMY (N/A) for Spinal Stenosis and Foraminotomies for the L-3-L-4 Nerve Roots Bilaterally.  SURGEON:  Surgeon(s) and Role:    * Latanya Maudlin, MD - Primary  PHYSICIAN ASSISTANT:Amber Elrod PA   ASSISTANTS: Ardeen Jourdain PA  ANESTHESIA:   general  EBL:  Total I/O In: 1000 [I.V.:1000] Out: 150 [Urine:100; Blood:50]  BLOOD ADMINISTERED:none  DRAINS: none   LOCAL MEDICATIONS USED:  MARCAINE 20cc of 0.50% Plain at start of the case and 20cc of Exparel at the end of the case.    SPECIMEN:  No Specimen  DISPOSITION OF SPECIMEN:  N/A  COUNTS:  YES  TOURNIQUET:  * No tourniquets in log *  DICTATION: .Other Dictation: Dictation Number [id number]  PLAN OF CARE: Admit for overnight observation  PATIENT DISPOSITION:  Stable in OR   Delay start of Pharmacological VTE agent (>24hrs) due to surgical blood loss or risk of bleeding: yes

## 2016-03-25 DIAGNOSIS — B1920 Unspecified viral hepatitis C without hepatic coma: Secondary | ICD-10-CM | POA: Diagnosis not present

## 2016-03-25 DIAGNOSIS — I10 Essential (primary) hypertension: Secondary | ICD-10-CM | POA: Diagnosis not present

## 2016-03-25 DIAGNOSIS — Z9221 Personal history of antineoplastic chemotherapy: Secondary | ICD-10-CM | POA: Diagnosis not present

## 2016-03-25 DIAGNOSIS — F17210 Nicotine dependence, cigarettes, uncomplicated: Secondary | ICD-10-CM | POA: Diagnosis not present

## 2016-03-25 DIAGNOSIS — M48062 Spinal stenosis, lumbar region with neurogenic claudication: Secondary | ICD-10-CM | POA: Diagnosis not present

## 2016-03-25 DIAGNOSIS — Z85118 Personal history of other malignant neoplasm of bronchus and lung: Secondary | ICD-10-CM | POA: Diagnosis not present

## 2016-03-25 MED ORDER — METHOCARBAMOL 500 MG PO TABS: 500.0000 mg | ORAL_TABLET | Freq: Four times a day (QID) | ORAL | 1 refills | Status: DC | PRN

## 2016-03-25 MED ORDER — OXYCODONE-ACETAMINOPHEN 5-325 MG PO TABS: 1.0000 | ORAL_TABLET | ORAL | 0 refills | Status: DC | PRN

## 2016-03-25 MED ORDER — ASPIRIN EC 325 MG PO TBEC: 325.0000 mg | DELAYED_RELEASE_TABLET | Freq: Every day | ORAL | 0 refills | Status: DC

## 2016-03-25 NOTE — Progress Notes (Signed)
PT Cancellation Note  Patient Details Name: Benjamin Case MRN: [id number] DOB: [birth year]   Cancelled Treatment:    Reason Eval/Treat Not Completed: PT screened, no needs identified, will sign off   Claretha Cooper 03/25/2016, 11:06 AM

## 2016-03-25 NOTE — Progress Notes (Signed)
Subjective: 1 Day Post-Op Procedure(s) (LRB): L3-4 CENTRAL DECOMPRESSION LUMBER LAMINECTOMY (N/A) Patient reports pain as 1 on 0-10 scale. Doing well this morning. Will DC   Objective: Vital signs in last 24 hours: Temp:  [97.6 F (36.4 C)-98.3 F (36.8 C)] 97.8 F (36.6 C) (10/19 0538) Pulse Rate:  [62-89] 62 (10/19 0538) Resp:  [9-18] 16 (10/19 0538) BP: (95-143)/(59-79) 97/59 (10/19 0538) SpO2:  [95 %-100 %] 96 % (10/19 0538) Weight:  [85.3 kg (188 lb)] 85.3 kg (188 lb) (10/18 1730)  Intake/Output from previous day: 10/18 0701 - 10/19 0700 In: 3130 [P.O.:780; I.V.:2250; IV Piggyback:100] Out: 1075 [Urine:1025; Blood:50] Intake/Output this shift: No intake/output data recorded.   Recent Labs  03/22/16 0930  HGB 15.3    Recent Labs  03/22/16 0930  WBC 13.1*  RBC 4.33  HCT 42.3  PLT 393    Recent Labs  03/22/16 0930  NA 133*  K 3.9  CL 96*  CO2 27  BUN 7  CREATININE 1.14  GLUCOSE 137*  CALCIUM 9.5    Recent Labs  03/22/16 0930  INR 0.86    Neurologically intact Dorsiflexion/Plantar flexion intact  Assessment/Plan: 1 Day Post-Op Procedure(s) (LRB): L3-4 CENTRAL DECOMPRESSION LUMBER LAMINECTOMY (N/A) Up with therapy Discharge home with home health  Treydon Henricks A 03/25/2016, 7:19 AM

## 2016-03-25 NOTE — Op Note (Signed)
Benjamin Case, Benjamin Case               ACCOUNT NO.:  [id number]  MEDICAL RECORD NO.:  [id number]  LOCATION:  0350                         FACILITY:  [facility]  PHYSICIAN:  Kipp Brood. Jamal Pavon, M.D.DATE OF BIRTH:  March 05, 1956  DATE OF PROCEDURE:  03/24/2016 DATE OF DISCHARGE:                              OPERATIVE REPORT   SURGEON:  Kipp Brood. Gladstone Lighter, M.D.  ASSISTANT:  Ardeen Jourdain, [place].  PREOPERATIVE DIAGNOSES: 1. Prior hemilaminectomy at L4-L5 on the left, years ago by another     Psychologist, sport and exercise. 2. Prior hemilaminectomy at L5-S1 on the left, years ago. 3. Spinal stenosis at L3-4. 4. Foraminal stenosis for the L3 root on the left. 5. Foraminal stenosis for the L4 root on the left. 6. Foraminal stenosis for the L3 root on the right. 7. Foraminal stenosis for the L4 root on the right.  He had bilateral     leg pain with numbness in both legs.  POSTOPERATIVE DIAGNOSES: 1. Prior hemilaminectomy at L4-L5 on the left, years ago by another     Psychologist, sport and exercise. 2. Prior hemilaminectomy at L5-S1 on the left, years ago. 3. Spinal stenosis at L3-4. 4. Foraminal stenosis for the L3 root on the left. 5. Foraminal stenosis for the L4 root on the left. 6. Foraminal stenosis for the L3 root on the right. 7. Foraminal stenosis for the L4 root on the right.  He had bilateral     leg pain with numbness in both legs.  OPERATIONS: 1. Central decompressive lumbar laminectomy at L3-L4. 2. Foraminotomies for the L3 root on the left. 3. Foraminotomy for the L4 root on the left.  Going over to the right,     he had a foraminotomy for the L3 root on the right, foraminotomy     for the L4 root on the right.  These were all for foraminal     stenosis.  DESCRIPTION OF PROCEDURE:  Under general anesthesia, routine orthopedic prep and draping, the lower back was carried out.  The appropriate time- out was carried out.  At this time, he had 2 g of IV Ancef.  The patient was on a spinal frame.  Two needles were placed in the  back for localization purposes and x-ray was taken.  Following that, I removed the needles and then I verified the time-out as well as I did not have to mark the back as we were going central and he had bilateral leg pain. Anyway, I injected 20 mL of 0.5% plain Marcaine into the soft tissue areas at the beginning of the case.  We then made an incision over the L3-4, extended it proximally and distally and I stripped the muscle from the L3-L4 spinous process and lamina bilaterally.  Another x-ray was taken with a Kocher clamp placed.  Following that, I continued stripping the muscle from the lamina bilaterally.  I cauterized the various veins. Following that, we then inserted the [facility] retractors.  I then identified the L3 spinous process, went down and removed the spinous process of L3 and a portion of L4 distally.  Note, at this time, we removed all the scar tissues over the L3-4 region.  I then identified the lamina and  then utilized the microscope and separated the ligamentum flavum from the lamina and did a laminectomy as far up as we needed to go.  I then protected the dura with the cottonoids and then removed the ligamentum flavum in the usual fashion.  I went out laterally and decompressed the foramina bilaterally.  We decompressed proximally and distally as far as we had to in regard to passing the hockey-stick and we had made sure we were totally free, distal and proximal and we then went out the foramina as well.  The dura now was free.  We thoroughly irrigated out the area.  We had good hemostasis as I said, the microscope was used.  I then loosely applied some Ultrafoam, Gelfoam and closed the wound layers in usual fashion.  I left a small distal deep and proximal deep part of the wound open for drainage purposes.  Subcu was closed in usual fashion.  Skin was closed with staples prior to closing the soft tissue, though I injected 20 mL of Exparel into the subcu and the  muscle bilaterally.  Sterile dressings were applied.          ______________________________ Kipp Brood Gladstone Lighter, M.D.     RAG/MEDQ  D:  03/24/2016  T:  03/25/2016  Job:  [id number]

## 2016-03-25 NOTE — Discharge Instructions (Signed)
For the first three days, remove your dressing, tape a piece of saran wrap over your incision. Take your shower, then remove the saran wrap and put a clean dressing on. After three days you can shower without the saran wrap.  No lifting. No driving while taking pain medications.  Call Dr. Gladstone Lighter if any wound complications or temperature of 101 degrees F or over.  Call the office for an appointment to see Dr. Gladstone Lighter in two weeks: [phone] and ask for Dr. Charlestine Night nurse, Brunilda Payor.

## 2016-03-25 NOTE — Evaluation (Signed)
   Occupational Therapy Evaluation Patient Details Name: Benjamin Case MRN: [id number] DOB: 02-04-1956 Today's Date: 03/25/2016    History of Present Illness L3-4 CENTRAL DECOMPRESSION LUMBER LAMINECTOMY    Clinical Impression   Education complete regarding ADL activity s/p back  S/p surgery    Follow Up Recommendations  No OT follow up    Equipment Recommendations  None recommended by OT    Recommendations for Other Services       Precautions / Restrictions Precautions Precautions: Back Restrictions Weight Bearing Restrictions: No      Mobility Bed Mobility Overal bed mobility: Modified Independent             General bed mobility comments: pt able to perform log roll and follow back precautions  Transfers Overall transfer level: Modified independent                         ADL Overall ADL's : At baseline                                       General ADL Comments: Pt overall A baseline per pt and wife. Education provided regarding ADL activity s/p back surgery. Education complete regarding back precautions and ADL activity      Vision            Pertinent Vitals/Pain Pain Assessment: 0-10 Pain Score: 4  Pain Location: back Pain Descriptors / Indicators: Sore Pain Intervention(s): Monitored during session;Repositioned     Hand Dominance     Extremity/Trunk Assessment Upper Extremity Assessment Upper Extremity Assessment: Generalized weakness           Communication Communication Communication: No difficulties   Cognition Arousal/Alertness: Awake/alert Behavior During Therapy: WFL for tasks assessed/performed Overall Cognitive Status: Within Functional Limits for tasks assessed                                Home Living Family/patient expects to be discharged to:: Private residence Living Arrangements: Spouse/significant other Available Help at Discharge: Family Type of Home: House        Home Layout: One level     Bathroom Shower/Tub: Occupational psychologist: Standard     Home Equipment: None          Prior Functioning/Environment Level of Independence: Independent                                        End of Session    Activity Tolerance: Patient tolerated treatment well Patient left: in chair;with call bell/phone within reach;with family/visitor present   Time: 1005-1020 OT Time Calculation (min): 15 min Charges:  OT General Charges $OT Visit: 1 Procedure OT Evaluation $OT Eval Moderate Complexity: 1 Procedure G-Codes: OT G-codes **NOT FOR INPATIENT CLASS** Functional Assessment Tool Used: clinical observation Functional Limitation: Self care Self Care Current Status (W0981): At least 1 percent but less than 20 percent impaired, limited or restricted Self Care Goal Status (X9147): At least 1 percent but less than 20 percent impaired, limited or restricted Self Care Discharge Status [phone]): At least 1 percent but less than 20 percent impaired, limited or restricted  [place], Benjamin Case 03/25/2016, 11:48 AM

## 2016-03-25 NOTE — Progress Notes (Signed)
Discharged from floor via w/c for transport to home. Belongings and wife with pt. No changes in assessment. Meric Joye, [employer]

## 2016-03-29 NOTE — Discharge Summary (Signed)
Physician Discharge Summary   Patient ID: Benjamin Case MRN: [id number] DOB/AGE: 1956-02-17 60 y.o.  Admit date: 03/24/2016 Discharge date: 03/25/2016  Primary Diagnosis: Lumbar spinal stenosis  Admission Diagnoses:  Past Medical History:  Diagnosis Date  . Dyspnea    increased exertion; pt states can climb flight of stairs w/o having to stop prior to reaching top   . Hepatitis    hepatitis C; pt states competed treatments  . History of chemotherapy   . Hypertension   . lung ca dx'd 12/2007   lt lobectomy  . Numbness    FEET BILATERAL   Discharge Diagnoses:   Active Problems:   Spinal stenosis, lumbar region with neurogenic claudication  Estimated body mass index is 26.98 kg/m as calculated from the following:   Height as of this encounter: '5\' 10"'  (1.778 m).   Weight as of this encounter: 85.3 kg (188 lb).  Procedure:  Procedure(s) (LRB): L3-4 CENTRAL DECOMPRESSION LUMBER LAMINECTOMY (N/A)   Consults: None  HPI: [name] presents with a chief complaint of low back pain. He has been seen previously for his low back and did have a hemilaminectomy and microdiscectomy by Dr. Gladstone Lighter back in 2007. He says that few weeks ago, he was doing some work when he felt a pop in his back. Since then, he has had pain. No pain down his legs. 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 Still has some discomfort in regards to that, but no relation to his back pain. He has been having pain into his legs even at rest. No change in bladder or bowel function. No groin pain. He is on pain medication. He takes hydrocodone 7.5, which he gets from his primary care doctor. He says that this is not helping his discomfort. He did not see any relief with oral corticosteroids or rest. CT myelogram revealed severe spinal stenosis L3-L4.   Laboratory Data: Hospital Outpatient Visit on 03/22/2016  Component Date Value Ref Range Status  . WBC  03/22/2016 13.1* 4.0 - 10.5 K/uL Final  . RBC 03/22/2016 4.33  4.22 - 5.81 MIL/uL Final  . Hemoglobin 03/22/2016 15.3  13.0 - 17.0 g/dL Final  . HCT 03/22/2016 42.3  39.0 - 52.0 % Final  . MCV 03/22/2016 97.7  78.0 - 100.0 fL Final  . MCH 03/22/2016 35.3* 26.0 - 34.0 pg Final  . MCHC 03/22/2016 36.2* 30.0 - 36.0 g/dL Final  . RDW 03/22/2016 12.0  11.5 - 15.5 % Final  . Platelets 03/22/2016 393  150 - 400 K/uL Final  . Neutrophils Relative % 03/22/2016 67  % Final  . Neutro Abs 03/22/2016 8.8* 1.7 - 7.7 K/uL Final  . Lymphocytes Relative 03/22/2016 22  % Final  . Lymphs Abs 03/22/2016 2.9  0.7 - 4.0 K/uL Final  . Monocytes Relative 03/22/2016 9  % Final  . Monocytes Absolute 03/22/2016 1.1* 0.1 - 1.0 K/uL Final  . Eosinophils Relative 03/22/2016 2  % Final  . Eosinophils Absolute 03/22/2016 0.2  0.0 - 0.7 K/uL Final  . Basophils Relative 03/22/2016 0  % Final  . Basophils Absolute 03/22/2016 0.1  0.0 - 0.1 K/uL Final  . Sodium 03/22/2016 133* 135 - 145 mmol/L Final  . Potassium 03/22/2016 3.9  3.5 - 5.1 mmol/L Final  . Chloride 03/22/2016 96* 101 - 111 mmol/L Final  . CO2 03/22/2016 27  22 - 32 mmol/L Final  . Glucose, Bld 03/22/2016 137* 65 - 99  mg/dL Final  . BUN 03/22/2016 7  6 - 20 mg/dL Final  . Creatinine, Ser 03/22/2016 1.14  0.61 - 1.24 mg/dL Final  . Calcium 03/22/2016 9.5  8.9 - 10.3 mg/dL Final  . Total Protein 03/22/2016 7.2  6.5 - 8.1 g/dL Final  . Albumin 03/22/2016 4.1  3.5 - 5.0 g/dL Final  . AST 03/22/2016 30  15 - 41 U/L Final  . ALT 03/22/2016 24  17 - 63 U/L Final  . Alkaline Phosphatase 03/22/2016 47  38 - 126 U/L Final  . Total Bilirubin 03/22/2016 0.7  0.3 - 1.2 mg/dL Final  . GFR calc non Af Amer 03/22/2016 >60  >60 mL/min Final  . GFR calc Af Amer 03/22/2016 >60  >60 mL/min Final   Comment: (NOTE) The eGFR has been calculated using the CKD EPI equation. This calculation has not been validated in all clinical situations. eGFR's persistently <60 mL/min  signify possible Chronic Kidney Disease.   . Anion gap 03/22/2016 10  5 - 15 Final  . Prothrombin Time 03/22/2016 11.7  11.4 - 15.2 seconds Final  . INR 03/22/2016 0.86   Final  . ABO/RH(D) 03/24/2016 O POS   Final  . Antibody Screen 03/24/2016 NEG   Final  . Sample Expiration 03/24/2016 03/27/2016   Final  . Extend sample reason 03/24/2016 NO TRANSFUSIONS OR PREGNANCY IN THE PAST 3 MONTHS   Final  . MRSA, PCR 03/22/2016 NEGATIVE  NEGATIVE Final  . Staphylococcus aureus 03/22/2016 NEGATIVE  NEGATIVE Final   Comment:        The Xpert SA Assay (FDA approved for NASAL specimens in patients over 29 years of age), is one component of a comprehensive surveillance program.  Test performance has been validated by [facility] for patients greater than or equal to 37 year old. It is not intended to diagnose infection nor to guide or monitor treatment.   . ABO/RH(D) 03/22/2016 O POS   Final     X-Rays:Dg Chest 2 View  Result Date: 03/12/2016 CLINICAL DATA:  Status post left pneumonectomy in 2009 for lung malignancy. No current complaints. Current smoker. EXAM: CHEST  2 VIEW COMPARISON:  CT scan of the chest of April 28, 2015 and chest x-ray of May 20, 2014 FINDINGS: There is complete opacification of the left hemi thorax. There is shift of the mediastinum from right to left. These findings are stable. The right lung is well-expanded and clear. The cardiac silhouette is obscured due to the mediastinal shift. There is calcification in the wall of the aortic arch. There are old fractures of the posterior aspects of the right seventh and eighth and ninth ribs. IMPRESSION: Stable post left pneumonectomy changes. Hyperinflation of the right lung consistent with COPD. Aortic atherosclerosis. Electronically Signed   By: David  Martinique M.D.   On: 03/12/2016 16:16   Dg Lumbar Spine 2-3 Views  Result Date: 03/22/2016 CLINICAL DATA:  Lumbar spine surgery. EXAM: LUMBAR SPINE - 2-3 VIEW COMPARISON:   CT 01/22/2016. FINDINGS: Lumbar spine numbered as per prior MRI. No acute bony abnormality identified. Diffuse multilevel degenerative change. Aortic atherosclerotic vascular calcification. IMPRESSION: 1. Diffuse multilevel degenerative change. 2. Aortic atherosclerotic vascular disease. Electronically Signed   By: Marcello Moores  Register   On: 03/22/2016 10:35   Dg Spine Portable 1 View  Result Date: 03/24/2016 CLINICAL DATA:  L3-L4 central decompression laminectomy EXAM: PORTABLE SPINE - 1 VIEW COMPARISON:  Portable exam 1522 hours compared to 1456 hours FINDINGS: Exam labeled with 5 lumbar vertebra. Disc  space narrowing with small endplate spurs at U0-A5, L4-5, and L5-S1. Crossed metallic probes are identified projecting dorsal to the inferior L3 and superior L4 levels. Atherosclerotic calcification aorta. IMPRESSION: Intraoperative localization of the inferior L3 and superior L4 levels. Electronically Signed   By: Lavonia Dana M.D.   On: 03/24/2016 15:50   Dg Spine Portable 1 View  Result Date: 03/24/2016 CLINICAL DATA:  L3-4 decompressive laminectomy EXAM: PORTABLE SPINE - 1 VIEW COMPARISON:  Study obtained earlier in the day FINDINGS: Cross-table lateral lumbar image time stamped 14:56:14 submitted. Metallic probe tip is posterior to the inferior aspect of the L3 vertebral body. There is no fracture or spondylolisthesis. There is disc space narrowing at L3-4, L4-5, L5-S1. There is atherosclerotic calcification in the aorta and common iliac arteries. IMPRESSION: Metallic probe tip is posterior to the inferior aspect of the L3 vertebral body. Disc space narrowing noted at L3-4, L4-5, L5-S1. No fracture or spondylolisthesis. There is aortoiliac atherosclerosis. Electronically Signed   By: Lowella Grip III M.D.   On: 03/24/2016 15:19   Dg Spine Portable 1 View  Result Date: 03/24/2016 CLINICAL DATA:  L3-4 central decompression laminectomy EXAM: PORTABLE SPINE - 1 VIEW COMPARISON:  03/22/2016 FINDINGS:  Spinal localizing needles are noted, the more cephalad projecting over the spinous process of L3 and the more caudad in the interspinous space between L3 and L4. Disc space narrowing at L4-5 and L5-S1 with associated facet sclerosis and hypertrophy. IMPRESSION: Spinal localizing needles are noted, the more cephalad projecting over the spinous process of L3 and the more caudad in the interspinous space between L3 and L4. Electronically Signed   By: Ashley Royalty M.D.   On: 03/24/2016 15:04    EKG: Orders placed or performed during the hospital encounter of 03/22/16  . EKG 12 lead  . EKG 12 lead     Hospital Course: FABIO WAH is a 60 y.o. who was admitted to [facility]. They were brought to the operating room on 03/24/2016 and underwent Procedure(s): L3-4 CENTRAL DECOMPRESSION LUMBER LAMINECTOMY.  Patient tolerated the procedure well and was later transferred to the recovery room and then to the orthopaedic floor for postoperative care.  They were given PO and IV analgesics for pain control following their surgery.  They were given 24 hours of postoperative antibiotics of  Anti-infectives    Start     Dose/Rate Route Frequency Ordered Stop   03/24/16 2200  ceFAZolin (ANCEF) IVPB 1 g/50 mL premix  Status:  Discontinued     1 g 100 mL/hr over 30 Minutes Intravenous Every 8 hours 03/24/16 1752 03/25/16 1416   03/24/16 1458  polymyxin B 500,000 Units, bacitracin 50,000 Units in sodium chloride irrigation 0.9 % 500 mL irrigation  Status:  Discontinued       As needed 03/24/16 1504 03/24/16 1613   03/24/16 1136  ceFAZolin (ANCEF) IVPB 2g/100 mL premix     2 g 200 mL/hr over 30 Minutes Intravenous On call to O.R. 03/24/16 1136 03/24/16 1430     and started on DVT prophylaxis in the form of Aspirin.   PT was ordered. Discharge planning consulted to help with postop disposition and equipment needs.  Patient had a fair night on the evening of surgery.  They started to get up OOB with therapy  on day one.  Dressing was changed and the incision was clean and dry.  Patient was seen in rounds and was ready to go home.   Diet: Cardiac diet Activity:WBAT  Follow-up:in 2 weeks Disposition - Home Discharged Condition: stable   Discharge Instructions    Call MD / Call 911    Complete by:  As directed    If you experience chest pain or shortness of breath, CALL 911 and be transported to the hospital emergency room.  If you develope a fever above 101 F, pus (white drainage) or increased drainage or redness at the wound, or calf pain, call your surgeon's office.   Constipation Prevention    Complete by:  As directed    Drink plenty of fluids.  Prune juice may be helpful.  You may use a stool softener, such as Colace (over the counter) 100 mg twice a day.  Use MiraLax (over the counter) for constipation as needed.   Diet - low sodium heart healthy    Complete by:  As directed    Discharge instructions    Complete by:  As directed    For the first three days, remove your dressing, tape a piece of saran wrap over your incision. Take your shower, then remove the saran wrap and put a clean dressing on. After three days you can shower without the saran wrap.  No lifting. No driving while taking pain medications.  Call Dr. Gladstone Lighter if any wound complications or temperature of 101 degrees F or over.  Call the office for an appointment to see Dr. Gladstone Lighter in two weeks: [phone] and ask for Dr. Charlestine Night nurse, Brunilda Payor.   Increase activity slowly as tolerated    Complete by:  As directed        Medication List    STOP taking these medications   HYDROcodone-acetaminophen 7.5-325 MG tablet Commonly known as:  NORCO     TAKE these medications   ALPRAZolam 1 MG tablet Commonly known as:  XANAX Take 1 mg by mouth 2 (two) times daily.   aspirin EC 325 MG tablet Take 1 tablet (325 mg total) by mouth daily. To prevent blood clots   esomeprazole 40 MG capsule Commonly known as:   NEXIUM Take 40 mg by mouth daily as needed (for heartburn).   ipratropium-albuterol 0.5-2.5 (3) MG/3ML Soln Commonly known as:  DUONEB Take 3 mLs by nebulization every 4 (four) hours as needed (for shortness of breath/wheezing.).   methocarbamol 500 MG tablet Commonly known as:  ROBAXIN Take 1 tablet (500 mg total) by mouth every 6 (six) hours as needed for muscle spasms.   oxyCODONE-acetaminophen 5-325 MG tablet Commonly known as:  PERCOCET/ROXICET Take 1-2 tablets by mouth every 4 (four) hours as needed for moderate pain.   SUPER B COMPLEX/C PO Take 1 capsule by mouth daily.   vitamin C 1000 MG tablet Take 1,000 mg by mouth daily.      Follow-up Information    GIOFFRE,RONALD A, MD Follow up in 2 week(s).   Specialty:  Orthopedic Surgery Contact information: [address] [place] [address] [phone]           Signed: Ardeen Jourdain, PA-C Orthopaedic Surgery 03/29/2016, 8:55 AM

## 2016-04-06 ENCOUNTER — Institutional Professional Consult (permissible substitution): Payer: Medicare Other | Admitting: Internal Medicine

## 2016-04-08 DIAGNOSIS — Z4789 Encounter for other orthopedic aftercare: Secondary | ICD-10-CM | POA: Diagnosis not present

## 2016-04-08 DIAGNOSIS — M545 Low back pain: Secondary | ICD-10-CM | POA: Diagnosis not present

## 2016-04-26 ENCOUNTER — Other Ambulatory Visit (HOSPITAL_BASED_OUTPATIENT_CLINIC_OR_DEPARTMENT_OTHER): Payer: Medicare Other

## 2016-04-26 ENCOUNTER — Ambulatory Visit (HOSPITAL_COMMUNITY)
Admission: RE | Admit: 2016-04-26 | Discharge: 2016-04-26 | Disposition: A | Discharge status: Home / Self Care | Payer: Medicare Other | Source: Ambulatory Visit | Attending: Internal Medicine | Admitting: Internal Medicine

## 2016-04-26 DIAGNOSIS — J439 Emphysema, unspecified: Secondary | ICD-10-CM | POA: Insufficient documentation

## 2016-04-26 DIAGNOSIS — Z85118 Personal history of other malignant neoplasm of bronchus and lung: Secondary | ICD-10-CM | POA: Diagnosis present

## 2016-04-26 DIAGNOSIS — R918 Other nonspecific abnormal finding of lung field: Secondary | ICD-10-CM | POA: Diagnosis not present

## 2016-04-26 DIAGNOSIS — C3492 Malignant neoplasm of unspecified part of left bronchus or lung: Secondary | ICD-10-CM | POA: Diagnosis not present

## 2016-04-26 DIAGNOSIS — I700 Atherosclerosis of aorta: Secondary | ICD-10-CM | POA: Insufficient documentation

## 2016-04-26 DIAGNOSIS — I2510 Atherosclerotic heart disease of native coronary artery without angina pectoris: Secondary | ICD-10-CM | POA: Diagnosis not present

## 2016-04-26 LAB — CBC WITH DIFFERENTIAL/PLATELET
BASO%: 0.5 % (ref 0.0–2.0)
BASOS ABS: 0.1 10*3/uL (ref 0.0–0.1)
EOS%: 2.1 % (ref 0.0–7.0)
Eosinophils Absolute: 0.2 10*3/uL (ref 0.0–0.5)
HEMATOCRIT: 49.6 % (ref 38.4–49.9)
HGB: 16.4 g/dL (ref 13.0–17.1)
LYMPH#: 2 10*3/uL (ref 0.9–3.3)
LYMPH%: 17.6 % (ref 14.0–49.0)
MCH: 34.8 pg — AB (ref 27.2–33.4)
MCHC: 33 g/dL (ref 32.0–36.0)
MCV: 105.2 fL — ABNORMAL HIGH (ref 79.3–98.0)
MONO#: 0.9 10*3/uL (ref 0.1–0.9)
MONO%: 8.3 % (ref 0.0–14.0)
NEUT#: 8 10*3/uL — ABNORMAL HIGH (ref 1.5–6.5)
NEUT%: 71.5 % (ref 39.0–75.0)
PLATELETS: 346 10*3/uL (ref 140–400)
RBC: 4.71 10*6/uL (ref 4.20–5.82)
RDW: 13.1 % (ref 11.0–14.6)
WBC: 11.1 10*3/uL — ABNORMAL HIGH (ref 4.0–10.3)

## 2016-04-26 LAB — COMPREHENSIVE METABOLIC PANEL
ALT: 25 U/L (ref 0–55)
ANION GAP: 9 meq/L (ref 3–11)
AST: 40 U/L — ABNORMAL HIGH (ref 5–34)
Albumin: 3.7 g/dL (ref 3.5–5.0)
Alkaline Phosphatase: 60 U/L (ref 40–150)
BUN: 6 mg/dL — ABNORMAL LOW (ref 7.0–26.0)
CALCIUM: 9.8 mg/dL (ref 8.4–10.4)
CHLORIDE: 99 meq/L (ref 98–109)
CO2: 30 mEq/L — ABNORMAL HIGH (ref 22–29)
Creatinine: 1.3 mg/dL (ref 0.7–1.3)
EGFR: 61 mL/min/{1.73_m2} — ABNORMAL LOW (ref >90)
Glucose: 127 mg/dl (ref 70–140)
POTASSIUM: 5 meq/L (ref 3.5–5.1)
Sodium: 138 mEq/L (ref 136–145)
Total Bilirubin: 0.47 mg/dL (ref 0.20–1.20)
Total Protein: 7.1 g/dL (ref 6.4–8.3)

## 2016-05-03 ENCOUNTER — Ambulatory Visit (HOSPITAL_BASED_OUTPATIENT_CLINIC_OR_DEPARTMENT_OTHER): Payer: Medicare Other | Admitting: Internal Medicine

## 2016-05-03 ENCOUNTER — Ambulatory Visit: Payer: BLUE CROSS/BLUE SHIELD | Admitting: Internal Medicine

## 2016-05-03 ENCOUNTER — Encounter: Payer: Self-pay | Admitting: Internal Medicine

## 2016-05-03 VITALS — BP 153/83 | HR 73 | Temp 98.3°F | Resp 18 | Wt 188.9 lb

## 2016-05-03 DIAGNOSIS — C3492 Malignant neoplasm of unspecified part of left bronchus or lung: Secondary | ICD-10-CM

## 2016-05-03 DIAGNOSIS — Z85118 Personal history of other malignant neoplasm of bronchus and lung: Secondary | ICD-10-CM

## 2016-05-03 DIAGNOSIS — I10 Essential (primary) hypertension: Secondary | ICD-10-CM | POA: Diagnosis not present

## 2016-05-03 DIAGNOSIS — Z4789 Encounter for other orthopedic aftercare: Secondary | ICD-10-CM | POA: Diagnosis not present

## 2016-05-03 NOTE — Progress Notes (Signed)
[place] [phone]   [phone]  OFFICE PROGRESS NOTE  Lilian Coma, MD [address] [place] [address]  DIAGNOSIS: Stage IIIA (T2 N2 Mx) non-small-cell lung cancer, squamous cell carcinoma diagnosed in June of 2009.   PRIOR THERAPY:  1. Status post left pneumonectomy with lymph node dissection under the care of Dr. Arlyce Dice on 12/28/2007. 2. Status post 4 cycles of adjuvant chemotherapy with cisplatin and docetaxel, last dose was given 04/29/2008.  CURRENT THERAPY: Observation.  INTERVAL HISTORY: Benjamin Case 60 y.o. male returns to the clinic today for routine annual month followup visit accompanied by his wife. The patient is feeling fine today with no specific complaints except for the back pain and shortness breath at baseline and increased with exertion. He recently underwent a back surgery under the care of Dr. Gladstone Lighter and feeling a little bit better after the surgery. He denied having any significant fatigue or weakness. He denied having any weight loss or night sweats. He has no chest pain, cough or hemoptysis. The patient has repeat CT scan of the chest performed recently and he is here for evaluation and discussion of his scan results.  MEDICAL HISTORY: Past Medical History:  Diagnosis Date  . Dyspnea    increased exertion; pt states can climb flight of stairs w/o having to stop prior to reaching top   . Hepatitis    hepatitis C; pt states competed treatments  . History of chemotherapy   . Hypertension   . lung ca dx'd 12/2007   lt lobectomy  . Numbness    FEET BILATERAL    ALLERGIES:  has No Known Allergies.  MEDICATIONS:  Current Outpatient Prescriptions  Medication Sig Dispense Refill  . ALPRAZolam (XANAX) 1 MG tablet Take 1 mg by mouth 2 (two) times daily.  5  . Ascorbic Acid (VITAMIN C) 1000 MG tablet Take 1,000 mg by mouth daily.    Marland Kitchen aspirin EC 325 MG tablet Take 1 tablet (325 mg  total) by mouth daily. To prevent blood clots 30 tablet 0  . esomeprazole (NEXIUM) 40 MG capsule Take 40 mg by mouth daily as needed (for heartburn).     Marland Kitchen ipratropium-albuterol (DUONEB) 0.5-2.5 (3) MG/3ML SOLN Take 3 mLs by nebulization every 4 (four) hours as needed (for shortness of breath/wheezing.).     Marland Kitchen methocarbamol (ROBAXIN) 500 MG tablet Take 1 tablet (500 mg total) by mouth every 6 (six) hours as needed for muscle spasms. 40 tablet 1  . oxyCODONE-acetaminophen (PERCOCET/ROXICET) 5-325 MG tablet Take 1-2 tablets by mouth every 4 (four) hours as needed for moderate pain. 60 tablet 0  . SUPER B COMPLEX/C PO Take 1 capsule by mouth daily.      No current facility-administered medications for this visit.     REVIEW OF SYSTEMS:  A comprehensive review of systems was negative except for: Respiratory: positive for dyspnea on exertion   PHYSICAL EXAMINATION: General appearance: alert, cooperative and no distress Head: Normocephalic, without obvious abnormality, atraumatic Neck: no adenopathy Resp: diminished breath sounds LLL and LUL and dullness to percussion LLL and LUL Cardio: regular rate and rhythm, S1, S2 normal, no murmur, click, rub or gallop GI: soft, non-tender; bowel sounds normal; no masses,  no organomegaly Extremities: extremities normal, atraumatic, no cyanosis or edema  ECOG PERFORMANCE STATUS: 1 - Symptomatic but completely ambulatory  Blood pressure (!) 153/83, pulse 73, temperature 98.3 F (36.8 C), temperature source Oral, resp. rate  18, weight 188 lb 14.4 oz (85.7 kg), SpO2 97 %.  LABORATORY DATA: Lab Results  Component Value Date   WBC 11.1 (H) 04/26/2016   HGB 16.4 04/26/2016   HCT 49.6 04/26/2016   MCV 105.2 (H) 04/26/2016   PLT 346 04/26/2016      Chemistry      Component Value Date/Time   NA 138 04/26/2016 1205   K 5.0 04/26/2016 1205   CL 96 (L) 03/22/2016 0930   CL 103 04/27/2012 1253   CO2 30 (H) 04/26/2016 1205   BUN 6.0 (L) 04/26/2016 1205     CREATININE 1.3 04/26/2016 1205      Component Value Date/Time   CALCIUM 9.8 04/26/2016 1205   ALKPHOS 60 04/26/2016 1205   AST 40 (H) 04/26/2016 1205   ALT 25 04/26/2016 1205   BILITOT 0.47 04/26/2016 1205       RADIOGRAPHIC STUDIES: Ct Chest Wo Contrast  Result Date: 04/26/2016 CLINICAL DATA:  Restaging left lung cancer. Left pneumonectomy. Chemotherapy completed. EXAM: CT CHEST WITHOUT CONTRAST TECHNIQUE: Multidetector CT imaging of the chest was performed following the standard protocol without IV contrast. COMPARISON:  04/28/2015 FINDINGS: Cardiovascular: Coronary, aortic arch, and branch vessel atherosclerotic vascular disease. Prominent main pulmonary, 3.6 cm diameter, likely from secondary pulmonary arterial hypertension. Mediastinum/Nodes: No pathologic adenopathy. Lungs/Pleura: Essentially stable findings of left pneumonectomy with pleural thickening and a small amount of pleural fluid on the left with leftward shift of cardiac and mediastinal structures. Right-sided emphysema. Azygos fissure noted. There is some mild tree-in-bud reticulonodular opacity in the right upper lobe along with mild dependent subsegmental atelectasis in the right lower lobe. Mild airway thickening in the right lung. I do not perceive a significant right-sided pulmonary nodule. Upper Abdomen: 1.3 cm hypodense lesion in the right hepatic lobe is unchanged by my measurement on image 143/2. 2.0 by 1.6 cm left adrenal adenoma, noncontrast internal CT density -4 Hounsfield units. Musculoskeletal: Old healed right posterior rib fractures with associated callus. Left fifth rib osteotomy and focal callus formation in the adjacent part of the left fourth rib. Mild lower thoracic spondylosis. IMPRESSION: 1. No findings of recurrent malignancy.  Left pneumonectomy. 2. There is mild airway thickening along with emphysema on the right side. Minimal tree-in-bud reticulonodular opacity in the right upper lobe may represent mild  atypical infectious bronchiolitis. 3. Coronary, aortic arch, and branch vessel atherosclerotic vascular disease. 4. Stable left adrenal adenoma. 5. Stable 1.3 cm hypodense lesion in in the right hepatic lobe. Electronically Signed   By: Van Clines M.D.   On: 04/26/2016 15:57   ASSESSMENT AND PLAN: This is a very pleasant 59 years old white male with history of stage IIIa non-small cell lung cancer status post left pneumonectomy followed by 4 cycles of adjuvant chemotherapy and has been observation since November of 2009 with no evidence for disease recurrence. Is no evidence for disease recurrence on the recent CT scan of the chest. I discussed the scan results with the patient and his wife. I recommended for him to continue on observation with routine follow-up visit by his primary care physician at this point. I will be happy to see him in the future if needed. The patient was advised to call me immediately if he has any concerning symptoms in the interval.  All questions were answered. The patient knows to call the clinic with any problems, questions or concerns. We can certainly see the patient much sooner if necessary.  Disclaimer: This note was dictated with voice  recognition software. Similar sounding words can inadvertently be transcribed and may be missed upon review.

## 2016-05-10 DIAGNOSIS — M961 Postlaminectomy syndrome, not elsewhere classified: Secondary | ICD-10-CM | POA: Diagnosis not present

## 2016-05-10 DIAGNOSIS — L821 Other seborrheic keratosis: Secondary | ICD-10-CM | POA: Diagnosis not present

## 2016-05-10 DIAGNOSIS — Z9889 Other specified postprocedural states: Secondary | ICD-10-CM | POA: Diagnosis not present

## 2016-05-10 DIAGNOSIS — M48062 Spinal stenosis, lumbar region with neurogenic claudication: Secondary | ICD-10-CM | POA: Diagnosis not present

## 2016-07-26 DIAGNOSIS — M545 Low back pain: Secondary | ICD-10-CM | POA: Diagnosis not present

## 2016-07-26 DIAGNOSIS — M961 Postlaminectomy syndrome, not elsewhere classified: Secondary | ICD-10-CM | POA: Diagnosis not present

## 2016-07-26 DIAGNOSIS — G8929 Other chronic pain: Secondary | ICD-10-CM | POA: Diagnosis not present

## 2016-08-04 ENCOUNTER — Encounter: Payer: Self-pay | Admitting: Diagnostic Neuroimaging

## 2016-08-04 ENCOUNTER — Ambulatory Visit (INDEPENDENT_AMBULATORY_CARE_PROVIDER_SITE_OTHER): Payer: Medicare Other | Admitting: Diagnostic Neuroimaging

## 2016-08-04 DIAGNOSIS — E539 Vitamin B deficiency, unspecified: Secondary | ICD-10-CM

## 2016-08-04 DIAGNOSIS — M48062 Spinal stenosis, lumbar region with neurogenic claudication: Secondary | ICD-10-CM | POA: Diagnosis not present

## 2016-08-04 DIAGNOSIS — G8929 Other chronic pain: Secondary | ICD-10-CM | POA: Diagnosis not present

## 2016-08-04 DIAGNOSIS — Z902 Acquired absence of lung [part of]: Secondary | ICD-10-CM

## 2016-08-04 DIAGNOSIS — M545 Low back pain: Secondary | ICD-10-CM

## 2016-08-04 DIAGNOSIS — C3492 Malignant neoplasm of unspecified part of left bronchus or lung: Secondary | ICD-10-CM

## 2016-08-04 NOTE — Patient Instructions (Signed)
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
## 2016-09-22 DIAGNOSIS — M961 Postlaminectomy syndrome, not elsewhere classified: Secondary | ICD-10-CM | POA: Diagnosis not present

## 2016-10-20 DIAGNOSIS — M961 Postlaminectomy syndrome, not elsewhere classified: Secondary | ICD-10-CM | POA: Diagnosis not present

## 2016-11-09 DIAGNOSIS — R7303 Prediabetes: Secondary | ICD-10-CM | POA: Diagnosis not present

## 2016-11-09 DIAGNOSIS — F411 Generalized anxiety disorder: Secondary | ICD-10-CM | POA: Diagnosis not present

## 2016-12-22 DIAGNOSIS — M961 Postlaminectomy syndrome, not elsewhere classified: Secondary | ICD-10-CM | POA: Diagnosis not present

## 2016-12-22 DIAGNOSIS — M47816 Spondylosis without myelopathy or radiculopathy, lumbar region: Secondary | ICD-10-CM | POA: Diagnosis not present

## 2017-01-26 DIAGNOSIS — G8929 Other chronic pain: Secondary | ICD-10-CM | POA: Diagnosis not present

## 2017-01-26 DIAGNOSIS — M545 Low back pain: Secondary | ICD-10-CM | POA: Diagnosis not present

## 2017-01-26 DIAGNOSIS — M4316 Spondylolisthesis, lumbar region: Secondary | ICD-10-CM | POA: Diagnosis not present

## 2017-01-26 DIAGNOSIS — M47816 Spondylosis without myelopathy or radiculopathy, lumbar region: Secondary | ICD-10-CM | POA: Diagnosis not present

## 2017-02-10 DIAGNOSIS — M545 Low back pain: Secondary | ICD-10-CM | POA: Diagnosis not present

## 2017-02-10 DIAGNOSIS — G894 Chronic pain syndrome: Secondary | ICD-10-CM | POA: Diagnosis not present

## 2017-02-10 DIAGNOSIS — M4316 Spondylolisthesis, lumbar region: Secondary | ICD-10-CM | POA: Diagnosis not present

## 2017-02-10 DIAGNOSIS — Z79891 Long term (current) use of opiate analgesic: Secondary | ICD-10-CM | POA: Diagnosis not present

## 2017-03-23 DIAGNOSIS — Z23 Encounter for immunization: Secondary | ICD-10-CM | POA: Diagnosis not present

## 2017-03-29 IMAGING — CT CT CHEST W/O CM
2 of 4 series · 15 of 36 positions shown, 18 images · non-contrast
Comparison: 04/28/2015

CLINICAL DATA: Restaging left lung cancer. Left pneumonectomy.
Chemotherapy completed.

EXAM:
CT CHEST WITHOUT CONTRAST
TECHNIQUE: Multidetector CT imaging of the chest was performed following the
standard protocol without IV contrast.

[Series 2: chest w/o st · axial · non-contrast · 0.93mm/px · z∈[+1395,+1681]mm · 12 of 167 slices shown, 15 images]
[im 12/167  mediastinal]
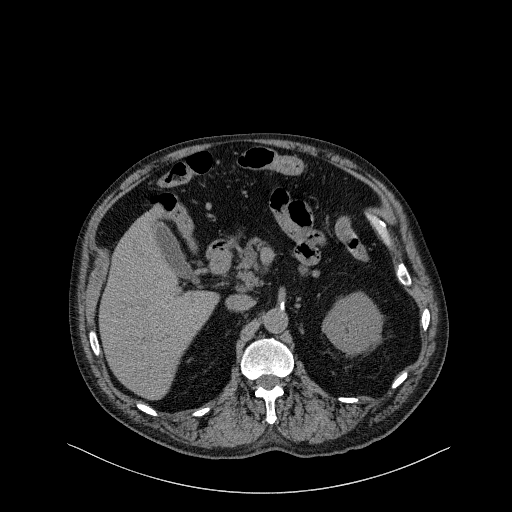
[im 12/167  lung]
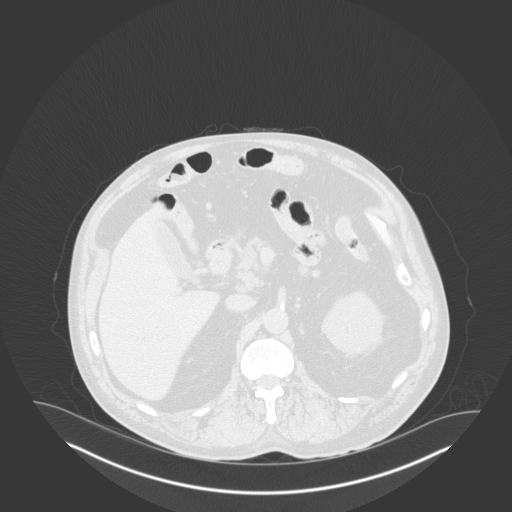
[im 24/167  lung]
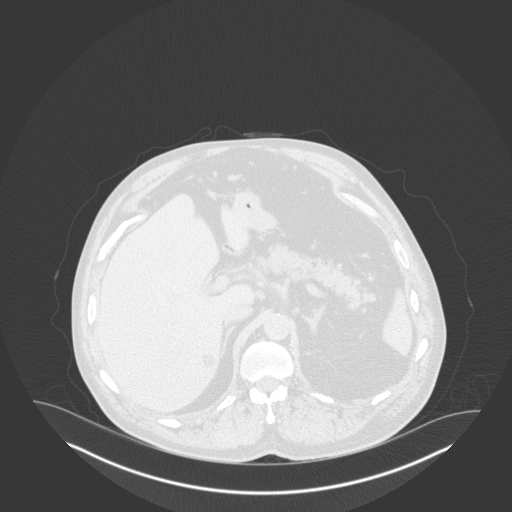
[im 36/167  lung]
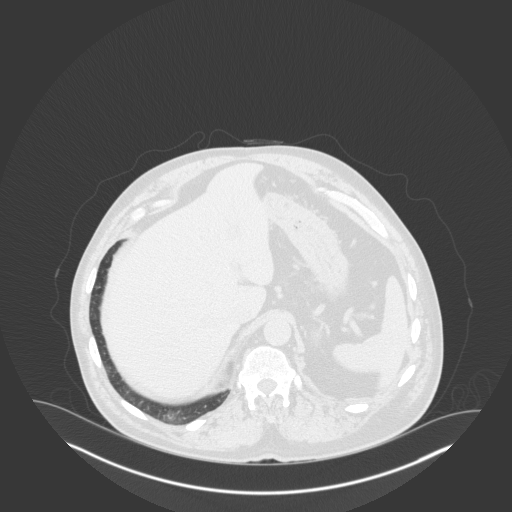
[im 48/167  lung]
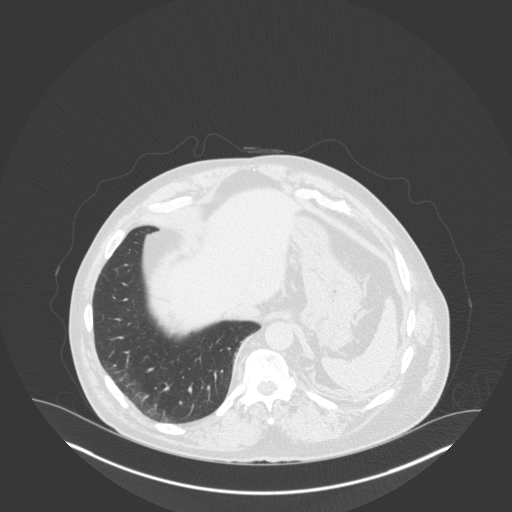
[im 60/167  mediastinal]
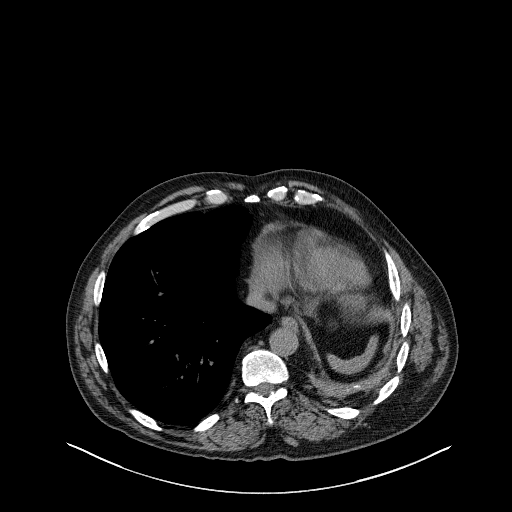
[im 60/167  lung]
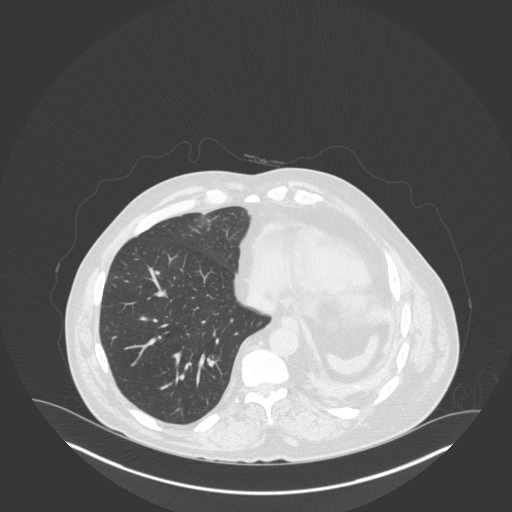
[im 72/167  lung]
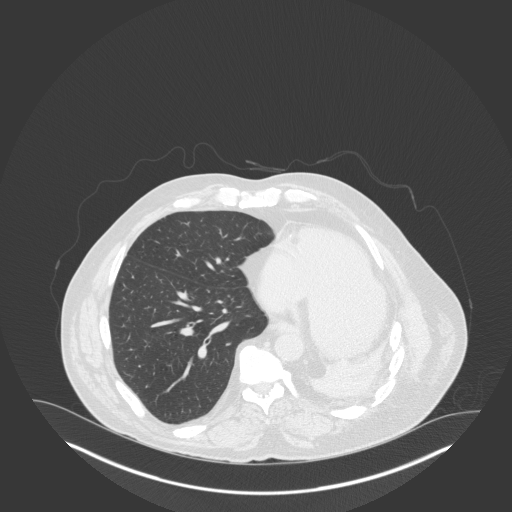
[im 95/167  lung]
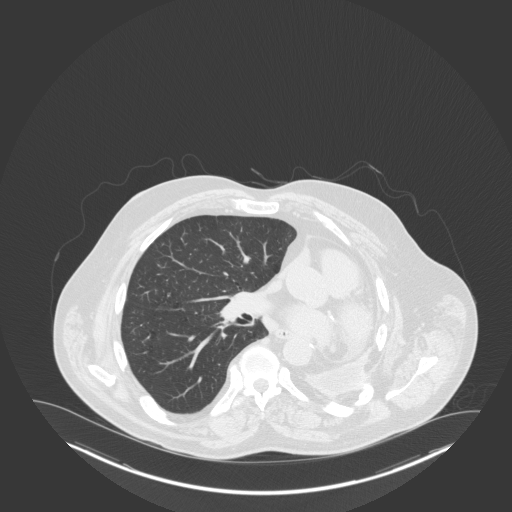
[im 107/167  lung]
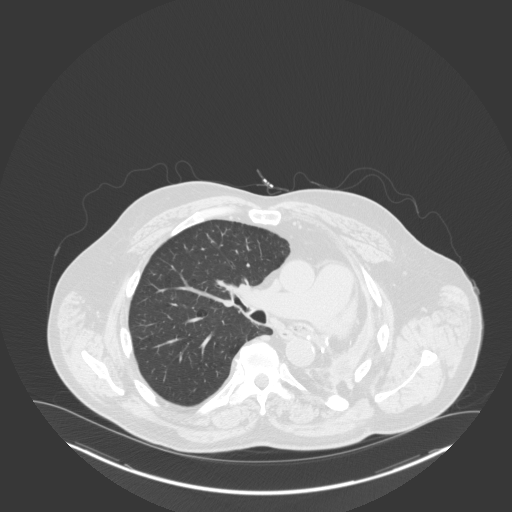
[im 119/167  mediastinal]
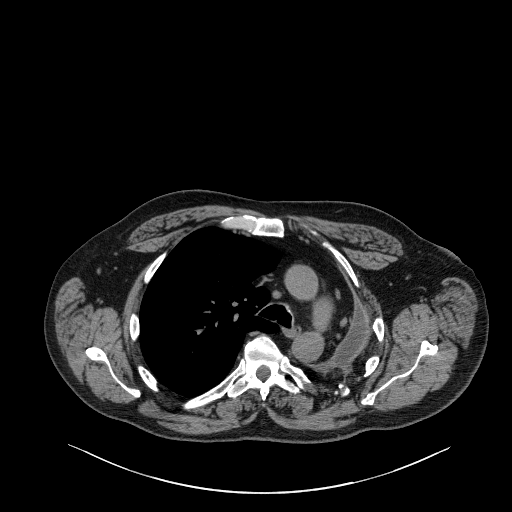
[im 119/167  lung]
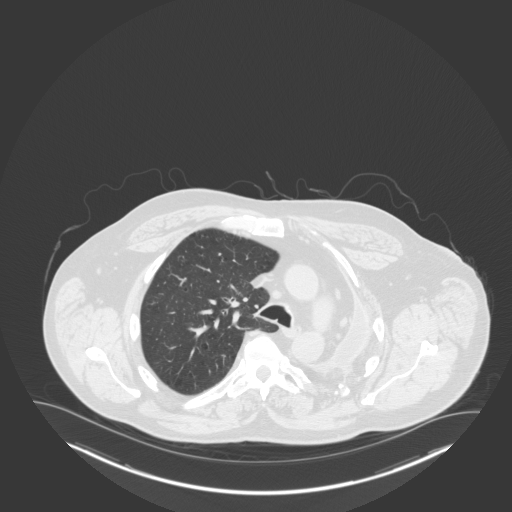
[im 131/167  lung]
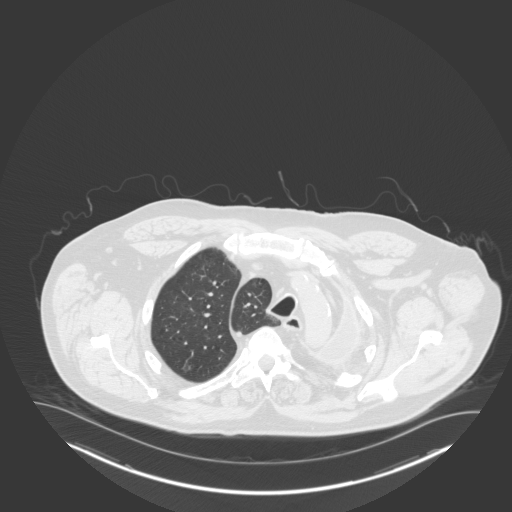
[im 143/167  lung]
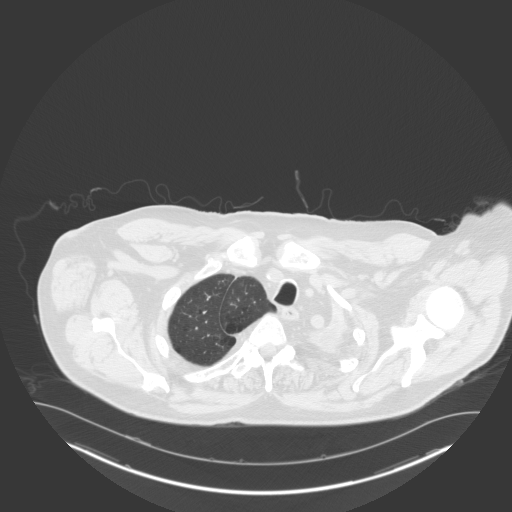
[im 155/167  lung]
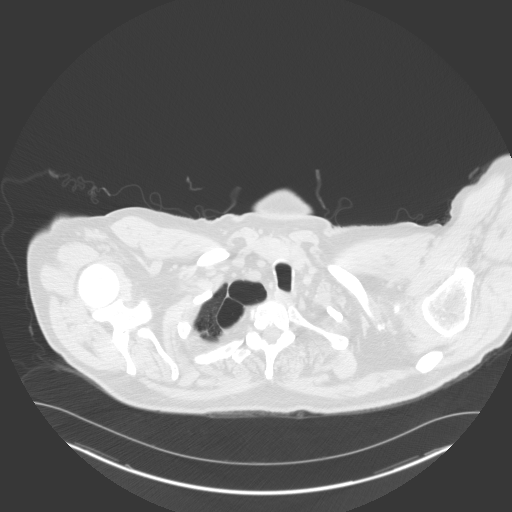

[Series 6: coronals · coronal · 0.65mm/px · 3 of 149 slices shown]
[im 30/149  lung]
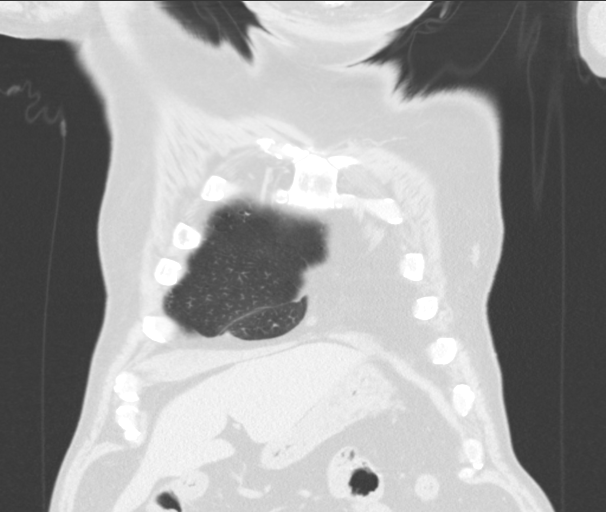
[im 60/149  lung]
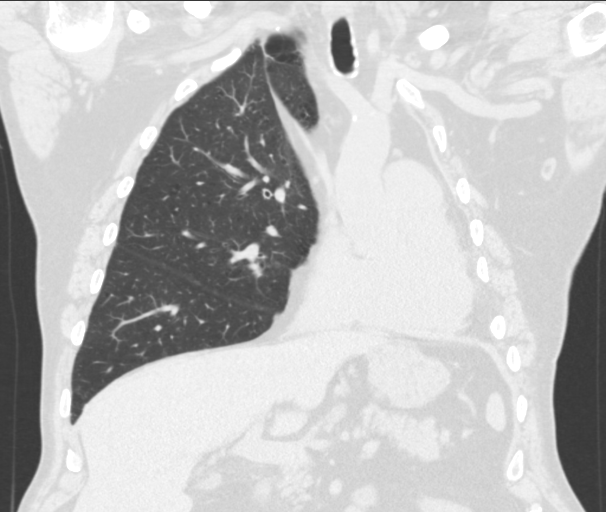
[im 89/149  lung]
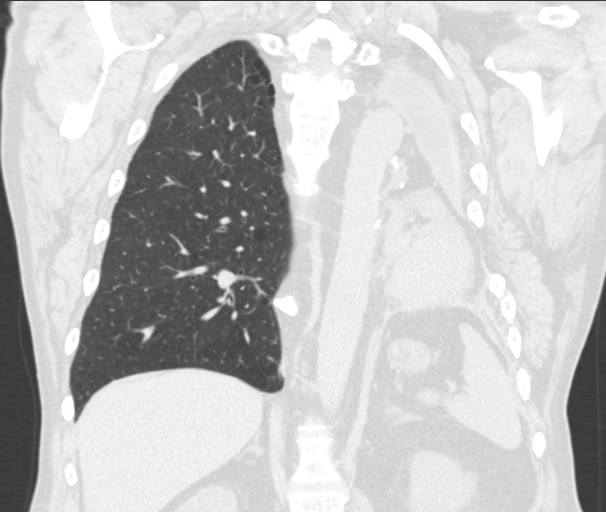

[15 of 36 positions shown; findings below may reference images not displayed]

FINDINGS: Cardiovascular: Coronary, aortic arch, and branch vessel
atherosclerotic vascular disease. Prominent main pulmonary, 3.6 cm
diameter, likely from secondary pulmonary arterial hypertension.

Mediastinum/Nodes: No pathologic adenopathy.

Lungs/Pleura: Essentially stable findings of left pneumonectomy with
pleural thickening and a small amount of pleural fluid on the left
with leftward shift of cardiac and mediastinal structures.
Right-sided emphysema. Azygos fissure noted. There is some mild
tree-in-bud reticulonodular opacity in the right upper lobe along
with mild dependent subsegmental atelectasis in the right lower
lobe. Mild airway thickening in the right lung. I do not perceive a
significant right-sided pulmonary nodule.

Upper Abdomen: 1.3 cm hypodense lesion in the right hepatic lobe is
unchanged by my measurement on image 143/2. 2.0 by 1.6 cm left
adrenal adenoma, noncontrast internal CT density -4 Hounsfield
units.

Musculoskeletal: Old healed right posterior rib fractures with
associated callus. Left fifth rib osteotomy and focal callus
formation in the adjacent part of the left fourth rib.

Mild lower thoracic spondylosis.
IMPRESSION: 1. No findings of recurrent malignancy.  Left pneumonectomy.
2. There is mild airway thickening along with emphysema on the right
side. Minimal tree-in-bud reticulonodular opacity in the right upper
lobe may represent mild atypical infectious bronchiolitis.
3. Coronary, aortic arch, and branch vessel atherosclerotic vascular
disease.
4. Stable left adrenal adenoma.
5. Stable 1.3 cm hypodense lesion in in the right hepatic lobe.

## 2017-05-04 DIAGNOSIS — Z4789 Encounter for other orthopedic aftercare: Secondary | ICD-10-CM | POA: Diagnosis not present

## 2017-05-04 DIAGNOSIS — M961 Postlaminectomy syndrome, not elsewhere classified: Secondary | ICD-10-CM | POA: Diagnosis not present

## 2017-05-04 DIAGNOSIS — M47816 Spondylosis without myelopathy or radiculopathy, lumbar region: Secondary | ICD-10-CM | POA: Diagnosis not present

## 2017-05-10 DIAGNOSIS — F411 Generalized anxiety disorder: Secondary | ICD-10-CM | POA: Diagnosis not present

## 2017-05-26 DIAGNOSIS — M961 Postlaminectomy syndrome, not elsewhere classified: Secondary | ICD-10-CM | POA: Diagnosis not present

## 2017-05-26 DIAGNOSIS — M47816 Spondylosis without myelopathy or radiculopathy, lumbar region: Secondary | ICD-10-CM | POA: Diagnosis not present

## 2017-06-02 DIAGNOSIS — M4316 Spondylolisthesis, lumbar region: Secondary | ICD-10-CM | POA: Diagnosis not present

## 2017-06-02 DIAGNOSIS — K136 Irritative hyperplasia of oral mucosa: Secondary | ICD-10-CM | POA: Diagnosis not present

## 2017-06-02 DIAGNOSIS — K1321 Leukoplakia of oral mucosa, including tongue: Secondary | ICD-10-CM | POA: Diagnosis not present

## 2017-06-02 DIAGNOSIS — M961 Postlaminectomy syndrome, not elsewhere classified: Secondary | ICD-10-CM | POA: Diagnosis not present

## 2017-06-07 HISTORY — PX: CATARACT EXTRACTION, BILATERAL: SHX1313

## 2017-09-15 DIAGNOSIS — G609 Hereditary and idiopathic neuropathy, unspecified: Secondary | ICD-10-CM | POA: Diagnosis not present

## 2017-09-15 DIAGNOSIS — M792 Neuralgia and neuritis, unspecified: Secondary | ICD-10-CM | POA: Diagnosis not present

## 2017-10-04 DIAGNOSIS — H16223 Keratoconjunctivitis sicca, not specified as Sjogren's, bilateral: Secondary | ICD-10-CM | POA: Diagnosis not present

## 2017-10-04 DIAGNOSIS — H25043 Posterior subcapsular polar age-related cataract, bilateral: Secondary | ICD-10-CM | POA: Diagnosis not present

## 2017-10-04 DIAGNOSIS — H2513 Age-related nuclear cataract, bilateral: Secondary | ICD-10-CM | POA: Diagnosis not present

## 2017-10-04 DIAGNOSIS — H40011 Open angle with borderline findings, low risk, right eye: Secondary | ICD-10-CM | POA: Diagnosis not present

## 2017-10-06 DIAGNOSIS — M5136 Other intervertebral disc degeneration, lumbar region: Secondary | ICD-10-CM | POA: Diagnosis not present

## 2017-10-20 DIAGNOSIS — M5136 Other intervertebral disc degeneration, lumbar region: Secondary | ICD-10-CM | POA: Diagnosis not present

## 2017-10-26 DIAGNOSIS — M961 Postlaminectomy syndrome, not elsewhere classified: Secondary | ICD-10-CM | POA: Diagnosis not present

## 2017-10-26 DIAGNOSIS — M5136 Other intervertebral disc degeneration, lumbar region: Secondary | ICD-10-CM | POA: Diagnosis not present

## 2017-11-08 DIAGNOSIS — M5136 Other intervertebral disc degeneration, lumbar region: Secondary | ICD-10-CM | POA: Diagnosis not present

## 2017-11-08 DIAGNOSIS — M5416 Radiculopathy, lumbar region: Secondary | ICD-10-CM | POA: Diagnosis not present

## 2017-11-11 DIAGNOSIS — G894 Chronic pain syndrome: Secondary | ICD-10-CM | POA: Diagnosis not present

## 2017-11-21 DIAGNOSIS — M545 Low back pain: Secondary | ICD-10-CM | POA: Diagnosis not present

## 2017-11-24 DIAGNOSIS — M5416 Radiculopathy, lumbar region: Secondary | ICD-10-CM | POA: Diagnosis not present

## 2017-11-24 DIAGNOSIS — M545 Low back pain: Secondary | ICD-10-CM | POA: Diagnosis not present

## 2017-11-25 ENCOUNTER — Encounter: Payer: Self-pay | Admitting: Vascular Surgery

## 2017-11-25 ENCOUNTER — Ambulatory Visit (INDEPENDENT_AMBULATORY_CARE_PROVIDER_SITE_OTHER): Payer: Medicare HMO | Admitting: Vascular Surgery

## 2017-11-25 ENCOUNTER — Encounter

## 2017-11-25 ENCOUNTER — Ambulatory Visit (HOSPITAL_COMMUNITY)
Admission: RE | Admit: 2017-11-25 | Discharge: 2017-11-25 | Disposition: A | Discharge status: Home / Self Care | Payer: Medicare HMO | Source: Ambulatory Visit | Attending: Family | Admitting: Family

## 2017-11-25 ENCOUNTER — Other Ambulatory Visit: Payer: Self-pay | Admitting: Vascular Surgery

## 2017-11-25 VITALS — BP 142/84 | HR 65 | Resp 16 | Ht 70.5 in | Wt 193.0 lb

## 2017-11-25 DIAGNOSIS — M79605 Pain in left leg: Secondary | ICD-10-CM

## 2017-11-25 DIAGNOSIS — M79604 Pain in right leg: Secondary | ICD-10-CM

## 2017-11-25 DIAGNOSIS — M25562 Pain in left knee: Secondary | ICD-10-CM | POA: Diagnosis not present

## 2017-11-25 DIAGNOSIS — M25561 Pain in right knee: Secondary | ICD-10-CM | POA: Diagnosis not present

## 2017-11-25 MED ORDER — GABAPENTIN 100 MG PO CAPS: 100.0000 mg | ORAL_CAPSULE | Freq: Three times a day (TID) | ORAL | 3 refills | Status: DC
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
## 2017-12-13 DIAGNOSIS — H2513 Age-related nuclear cataract, bilateral: Secondary | ICD-10-CM | POA: Diagnosis not present

## 2017-12-13 DIAGNOSIS — H2512 Age-related nuclear cataract, left eye: Secondary | ICD-10-CM | POA: Diagnosis not present

## 2017-12-27 DIAGNOSIS — R739 Hyperglycemia, unspecified: Secondary | ICD-10-CM | POA: Diagnosis not present

## 2017-12-27 DIAGNOSIS — M5440 Lumbago with sciatica, unspecified side: Secondary | ICD-10-CM | POA: Diagnosis not present

## 2017-12-27 DIAGNOSIS — Z79899 Other long term (current) drug therapy: Secondary | ICD-10-CM | POA: Diagnosis not present

## 2018-01-10 DIAGNOSIS — I272 Pulmonary hypertension, unspecified: Secondary | ICD-10-CM | POA: Diagnosis not present

## 2018-01-10 DIAGNOSIS — D759 Disease of blood and blood-forming organs, unspecified: Secondary | ICD-10-CM | POA: Diagnosis not present

## 2018-01-10 DIAGNOSIS — E785 Hyperlipidemia, unspecified: Secondary | ICD-10-CM | POA: Diagnosis not present

## 2018-01-10 DIAGNOSIS — N529 Male erectile dysfunction, unspecified: Secondary | ICD-10-CM | POA: Diagnosis not present

## 2018-01-10 DIAGNOSIS — Z125 Encounter for screening for malignant neoplasm of prostate: Secondary | ICD-10-CM | POA: Diagnosis not present

## 2018-01-10 DIAGNOSIS — F419 Anxiety disorder, unspecified: Secondary | ICD-10-CM | POA: Diagnosis not present

## 2018-01-10 DIAGNOSIS — Z79899 Other long term (current) drug therapy: Secondary | ICD-10-CM | POA: Diagnosis not present

## 2018-01-10 DIAGNOSIS — F330 Major depressive disorder, recurrent, mild: Secondary | ICD-10-CM | POA: Diagnosis not present

## 2018-01-10 DIAGNOSIS — I10 Essential (primary) hypertension: Secondary | ICD-10-CM | POA: Diagnosis not present

## 2018-01-10 DIAGNOSIS — Z0000 Encounter for general adult medical examination without abnormal findings: Secondary | ICD-10-CM | POA: Diagnosis not present

## 2018-01-10 DIAGNOSIS — E291 Testicular hypofunction: Secondary | ICD-10-CM | POA: Diagnosis not present

## 2018-01-10 DIAGNOSIS — J449 Chronic obstructive pulmonary disease, unspecified: Secondary | ICD-10-CM | POA: Diagnosis not present

## 2018-01-10 DIAGNOSIS — G894 Chronic pain syndrome: Secondary | ICD-10-CM | POA: Diagnosis not present

## 2018-01-10 DIAGNOSIS — R7303 Prediabetes: Secondary | ICD-10-CM | POA: Diagnosis not present

## 2018-01-13 ENCOUNTER — Encounter: Payer: Medicare Other | Admitting: Vascular Surgery

## 2018-01-13 ENCOUNTER — Encounter (HOSPITAL_COMMUNITY): Payer: Medicare Other

## 2018-01-17 DIAGNOSIS — E291 Testicular hypofunction: Secondary | ICD-10-CM | POA: Diagnosis not present

## 2018-01-17 DIAGNOSIS — N182 Chronic kidney disease, stage 2 (mild): Secondary | ICD-10-CM | POA: Diagnosis not present

## 2018-01-17 DIAGNOSIS — D7589 Other specified diseases of blood and blood-forming organs: Secondary | ICD-10-CM | POA: Diagnosis not present

## 2018-01-17 DIAGNOSIS — Z1211 Encounter for screening for malignant neoplasm of colon: Secondary | ICD-10-CM | POA: Diagnosis not present

## 2018-01-17 DIAGNOSIS — E1121 Type 2 diabetes mellitus with diabetic nephropathy: Secondary | ICD-10-CM | POA: Diagnosis not present

## 2018-01-19 DIAGNOSIS — H2513 Age-related nuclear cataract, bilateral: Secondary | ICD-10-CM | POA: Diagnosis not present

## 2018-01-20 DIAGNOSIS — H2511 Age-related nuclear cataract, right eye: Secondary | ICD-10-CM | POA: Diagnosis not present

## 2018-01-20 DIAGNOSIS — H2512 Age-related nuclear cataract, left eye: Secondary | ICD-10-CM | POA: Diagnosis not present

## 2018-01-26 DIAGNOSIS — M545 Low back pain: Secondary | ICD-10-CM | POA: Diagnosis not present

## 2018-01-26 DIAGNOSIS — G8929 Other chronic pain: Secondary | ICD-10-CM | POA: Diagnosis not present

## 2018-01-26 DIAGNOSIS — G629 Polyneuropathy, unspecified: Secondary | ICD-10-CM | POA: Diagnosis not present

## 2018-01-26 DIAGNOSIS — Z79899 Other long term (current) drug therapy: Secondary | ICD-10-CM | POA: Diagnosis not present

## 2018-02-08 DIAGNOSIS — M5440 Lumbago with sciatica, unspecified side: Secondary | ICD-10-CM | POA: Diagnosis not present

## 2018-02-08 DIAGNOSIS — Z79899 Other long term (current) drug therapy: Secondary | ICD-10-CM | POA: Diagnosis not present

## 2018-02-10 DIAGNOSIS — H2511 Age-related nuclear cataract, right eye: Secondary | ICD-10-CM | POA: Diagnosis not present

## 2018-02-10 DIAGNOSIS — H2513 Age-related nuclear cataract, bilateral: Secondary | ICD-10-CM | POA: Diagnosis not present

## 2018-03-08 DIAGNOSIS — M5440 Lumbago with sciatica, unspecified side: Secondary | ICD-10-CM | POA: Diagnosis not present

## 2018-03-08 DIAGNOSIS — F17200 Nicotine dependence, unspecified, uncomplicated: Secondary | ICD-10-CM | POA: Diagnosis not present

## 2018-03-08 DIAGNOSIS — R7302 Impaired glucose tolerance (oral): Secondary | ICD-10-CM | POA: Diagnosis not present

## 2018-03-08 DIAGNOSIS — Z79899 Other long term (current) drug therapy: Secondary | ICD-10-CM | POA: Diagnosis not present

## 2018-04-03 DIAGNOSIS — M5416 Radiculopathy, lumbar region: Secondary | ICD-10-CM | POA: Diagnosis not present

## 2018-04-03 DIAGNOSIS — M545 Low back pain: Secondary | ICD-10-CM | POA: Diagnosis not present

## 2018-04-08 DIAGNOSIS — F17200 Nicotine dependence, unspecified, uncomplicated: Secondary | ICD-10-CM | POA: Diagnosis not present

## 2018-04-08 DIAGNOSIS — Z79899 Other long term (current) drug therapy: Secondary | ICD-10-CM | POA: Diagnosis not present

## 2018-04-08 DIAGNOSIS — R7302 Impaired glucose tolerance (oral): Secondary | ICD-10-CM | POA: Diagnosis not present

## 2018-04-08 DIAGNOSIS — M5440 Lumbago with sciatica, unspecified side: Secondary | ICD-10-CM | POA: Diagnosis not present

## 2018-04-18 DIAGNOSIS — M545 Low back pain: Secondary | ICD-10-CM | POA: Diagnosis not present

## 2018-04-20 DIAGNOSIS — Z23 Encounter for immunization: Secondary | ICD-10-CM | POA: Diagnosis not present

## 2018-04-20 DIAGNOSIS — J441 Chronic obstructive pulmonary disease with (acute) exacerbation: Secondary | ICD-10-CM | POA: Diagnosis not present

## 2018-04-20 DIAGNOSIS — N182 Chronic kidney disease, stage 2 (mild): Secondary | ICD-10-CM | POA: Diagnosis not present

## 2018-04-20 DIAGNOSIS — I10 Essential (primary) hypertension: Secondary | ICD-10-CM | POA: Diagnosis not present

## 2018-04-20 DIAGNOSIS — E1121 Type 2 diabetes mellitus with diabetic nephropathy: Secondary | ICD-10-CM | POA: Diagnosis not present

## 2018-04-20 DIAGNOSIS — D7589 Other specified diseases of blood and blood-forming organs: Secondary | ICD-10-CM | POA: Diagnosis not present

## 2018-04-20 DIAGNOSIS — E291 Testicular hypofunction: Secondary | ICD-10-CM | POA: Diagnosis not present

## 2018-04-25 DIAGNOSIS — E291 Testicular hypofunction: Secondary | ICD-10-CM | POA: Diagnosis not present

## 2018-05-01 DIAGNOSIS — M4696 Unspecified inflammatory spondylopathy, lumbar region: Secondary | ICD-10-CM | POA: Diagnosis not present

## 2018-05-03 DIAGNOSIS — E291 Testicular hypofunction: Secondary | ICD-10-CM | POA: Diagnosis not present

## 2018-05-06 DIAGNOSIS — F17200 Nicotine dependence, unspecified, uncomplicated: Secondary | ICD-10-CM | POA: Diagnosis not present

## 2018-05-06 DIAGNOSIS — Z79899 Other long term (current) drug therapy: Secondary | ICD-10-CM | POA: Diagnosis not present

## 2018-05-06 DIAGNOSIS — M545 Low back pain: Secondary | ICD-10-CM | POA: Diagnosis not present

## 2018-05-06 DIAGNOSIS — G8929 Other chronic pain: Secondary | ICD-10-CM | POA: Diagnosis not present

## 2018-05-08 DIAGNOSIS — F17210 Nicotine dependence, cigarettes, uncomplicated: Secondary | ICD-10-CM | POA: Diagnosis not present

## 2018-05-08 DIAGNOSIS — M47816 Spondylosis without myelopathy or radiculopathy, lumbar region: Secondary | ICD-10-CM | POA: Diagnosis not present

## 2018-05-08 DIAGNOSIS — Z716 Tobacco abuse counseling: Secondary | ICD-10-CM | POA: Diagnosis not present

## 2018-06-06 DIAGNOSIS — M5440 Lumbago with sciatica, unspecified side: Secondary | ICD-10-CM | POA: Diagnosis not present

## 2018-06-06 DIAGNOSIS — Z79899 Other long term (current) drug therapy: Secondary | ICD-10-CM | POA: Diagnosis not present

## 2018-06-08 DIAGNOSIS — E7800 Pure hypercholesterolemia, unspecified: Secondary | ICD-10-CM | POA: Diagnosis not present

## 2018-06-13 DIAGNOSIS — M47816 Spondylosis without myelopathy or radiculopathy, lumbar region: Secondary | ICD-10-CM | POA: Diagnosis not present

## 2018-07-04 DIAGNOSIS — M5440 Lumbago with sciatica, unspecified side: Secondary | ICD-10-CM | POA: Diagnosis not present

## 2018-07-04 DIAGNOSIS — F17200 Nicotine dependence, unspecified, uncomplicated: Secondary | ICD-10-CM | POA: Diagnosis not present

## 2018-07-04 DIAGNOSIS — Z79899 Other long term (current) drug therapy: Secondary | ICD-10-CM | POA: Diagnosis not present

## 2018-07-10 DIAGNOSIS — M47816 Spondylosis without myelopathy or radiculopathy, lumbar region: Secondary | ICD-10-CM | POA: Diagnosis not present

## 2018-08-01 DIAGNOSIS — G8929 Other chronic pain: Secondary | ICD-10-CM | POA: Diagnosis not present

## 2018-08-01 DIAGNOSIS — Z79899 Other long term (current) drug therapy: Secondary | ICD-10-CM | POA: Diagnosis not present

## 2018-08-01 DIAGNOSIS — F17200 Nicotine dependence, unspecified, uncomplicated: Secondary | ICD-10-CM | POA: Diagnosis not present

## 2018-08-01 DIAGNOSIS — M545 Low back pain: Secondary | ICD-10-CM | POA: Diagnosis not present

## 2018-11-16 ENCOUNTER — Other Ambulatory Visit: Payer: Self-pay | Admitting: Orthopedic Surgery

## 2018-12-07 ENCOUNTER — Encounter (HOSPITAL_COMMUNITY): Payer: Self-pay

## 2018-12-07 NOTE — Patient Instructions (Addendum)
YOU NEED TO HAVE A COVID 19 TEST ON______Monday , July 6th______, THIS TEST MUST BE DONE BEFORE SURGERY, COME TO [place] ENTRANCE. ONCE YOUR COVID TEST IS COMPLETED, PLEASE BEGIN THE QUARANTINE INSTRUCTIONS AS OUTLINED IN YOUR HANDOUT.                Benjamin Case    Your procedure is scheduled on: 12-14-2018  Report to [facility] Main  Entrance    Report to SHORT STAY at  530AM   NO SMOKING 24 HOURS BEFORE SURGERY    Call this number if you have problems the morning of surgery [phone]    Remember: [place], NO CHEWING GUM CANDY OR MINTS.    Do not eat food After Midnight. YOU MAY HAVE CLEAR LIQUIDS FROM MIDNIGHT UNTIL 4:30AM. At 4:30AM Please finish the prescribed Pre-Surgery Gatorade drink. Nothing by mouth after you finish the Gatorade drink !   CLEAR LIQUID DIET   Foods Allowed                                                                     Foods Excluded  Coffee and tea, regular and decaf                             liquids that you cannot  Plain Jell-O in any flavor                                             see through such as: Fruit ices (not with fruit pulp)                                     milk, soups, orange juice  Iced Popsicles                                    All solid food Carbonated beverages, regular and diet                                    Cranberry, grape and apple juices Sports drinks like Gatorade Lightly seasoned clear broth or consume(fat free) Sugar, honey syrup  Sample Menu Breakfast                                Lunch                                     Supper Cranberry juice                    Beef broth  Chicken broth Jell-O                                     Grape juice                           Apple juice Coffee or tea                        Jell-O                                      Popsicle                                  Coffee or tea                        Coffee or tea  _____________________________________________________________________     Take these medicines the morning of surgery with A SIP OF WATER: XANAX, ESOMEPRAZOLE, PERCOCET IF NEEDED                                You may not have any metal on your body including hair pins and              piercings  Do not wear jewelry, make-up, lotions, powders or perfumes, deodorant             Do not wear nail polish.  Do not shave  48 hours prior to surgery.              Men may shave face and neck.   Do not bring valuables to the hospital. [place].  Contacts, dentures or bridgework may not be worn into surgery.                  : _____________________________________________________________________             [facility] - Preparing for Surgery Before surgery, you can play an important role.  Because skin is not sterile, your skin needs to be as free of germs as possible.  You can reduce the number of germs on your skin by washing with CHG (chlorahexidine gluconate) soap before surgery.  CHG is an antiseptic cleaner which kills germs and bonds with the skin to continue killing germs even after washing. Please DO NOT use if you have an allergy to CHG or antibacterial soaps.  If your skin becomes reddened/irritated stop using the CHG and inform your nurse when you arrive at Short Stay. Do not shave (including legs and underarms) for at least 48 hours prior to the first CHG shower.  You may shave your face/neck. Please follow these instructions carefully:  1.  Shower with CHG Soap the night before surgery and the  morning of Surgery.  2.  If you choose to wash your hair, wash your hair first as usual with your  normal  shampoo.  3.  After you shampoo, rinse your hair and body thoroughly to remove the  shampoo.  4.  Use CHG as you would any  other liquid soap.  You can apply chg directly  to the skin and wash                       Gently with a scrungie or clean washcloth.  5.  Apply the CHG Soap to your body ONLY FROM THE NECK DOWN.   Do not use on face/ open                           Wound or open sores. Avoid contact with eyes, ears mouth and genitals (private parts).                       Wash face,  Genitals (private parts) with your normal soap.             6.  Wash thoroughly, paying special attention to the area where your surgery  will be performed.  7.  Thoroughly rinse your body with warm water from the neck down.  8.  DO NOT shower/wash with your normal soap after using and rinsing off  the CHG Soap.                9.  Pat yourself dry with a clean towel.            10.  Wear clean pajamas.            11.  Place clean sheets on your bed the night of your first shower and do not  sleep with pets. Day of Surgery : Do not apply any lotions/deodorants the morning of surgery.  Please wear clean clothes to the hospital/surgery center.  FAILURE TO FOLLOW THESE INSTRUCTIONS MAY RESULT IN THE CANCELLATION OF YOUR SURGERY PATIENT SIGNATURE_________________________________  NURSE SIGNATURE__________________________________  ________________________________________________________________________   Benjamin Case  An incentive spirometer is a tool that can help keep your lungs clear and active. This tool measures how well you are filling your lungs with each breath. Taking long deep breaths may help reverse or decrease the chance of developing breathing (pulmonary) problems (especially infection) following:  A long period of time when you are unable to move or be active. BEFORE THE PROCEDURE   If the spirometer includes an indicator to show your best effort, your nurse or respiratory therapist will set it to a desired goal.  If possible, sit up straight or lean slightly forward. Try not to slouch.  Hold the  incentive spirometer in an upright position. INSTRUCTIONS FOR USE  1. Sit on the edge of your bed if possible, or sit up as far as you can in bed or on a chair. 2. Hold the incentive spirometer in an upright position. 3. Breathe out normally. 4. Place the mouthpiece in your mouth and seal your lips tightly around it. 5. Breathe in slowly and as deeply as possible, raising the piston or the ball toward the top of the column. 6. Hold your breath for 3-5 seconds or for as long as possible. Allow the piston or ball to fall to the bottom of the column. 7. Remove the mouthpiece from your mouth and breathe out normally. 8. Rest for a few seconds and repeat Steps 1 through 7 at least 10 times every 1-2 hours when you are awake. Take your time and take a few normal breaths between deep breaths. 9. The spirometer may include an indicator to  show your best effort. Use the indicator as a goal to work toward during each repetition. 10. After each set of 10 deep breaths, practice coughing to be sure your lungs are clear. If you have an incision (the cut made at the time of surgery), support your incision when coughing by placing a pillow or rolled up towels firmly against it. Once you are able to get out of bed, walk around indoors and cough well. You may stop using the incentive spirometer when instructed by your caregiver.  RISKS AND COMPLICATIONS  Take your time so you do not get dizzy or light-headed.  If you are in pain, you may need to take or ask for pain medication before doing incentive spirometry. It is harder to take a deep breath if you are having pain. AFTER USE  Rest and breathe slowly and easily.  It can be helpful to keep track of a log of your progress. Your caregiver can provide you with a simple table to help with this. If you are using the spirometer at home, follow these instructions: [place] IF:   You are having difficultly using the spirometer.  You have trouble using  the spirometer as often as instructed.  Your pain medication is not giving enough relief while using the spirometer.  You develop fever of 100.5 F (38.1 C) or higher. SEEK IMMEDIATE MEDICAL CARE IF:   You cough up bloody sputum that had not been present before.  You develop fever of 102 F (38.9 C) or greater.  You develop worsening pain at or near the incision site. MAKE SURE YOU:   Understand these instructions.  Will watch your condition.  Will get help right away if you are not doing well or get worse. Document Released: 10/04/2006 Document Revised: 08/16/2011 Document Reviewed: 12/05/2006 [facility] Patient Information 2014 [place], [place].   ________________________________________________________________________

## 2018-12-11 ENCOUNTER — Encounter (HOSPITAL_COMMUNITY)
Admission: RE | Admit: 2018-12-11 | Discharge: 2018-12-11 | Disposition: A | Discharge status: Home / Self Care | Payer: Medicare Other | Source: Ambulatory Visit | Attending: Orthopedic Surgery | Admitting: Orthopedic Surgery

## 2018-12-11 ENCOUNTER — Other Ambulatory Visit: Payer: Self-pay

## 2018-12-11 ENCOUNTER — Other Ambulatory Visit (HOSPITAL_COMMUNITY)
Admission: RE | Admit: 2018-12-11 | Discharge: 2018-12-11 | Disposition: A | Discharge status: Home / Self Care | Payer: Medicare Other | Source: Ambulatory Visit | Attending: Orthopedic Surgery | Admitting: Orthopedic Surgery

## 2018-12-11 ENCOUNTER — Encounter (HOSPITAL_COMMUNITY): Payer: Self-pay

## 2018-12-11 DIAGNOSIS — R200 Anesthesia of skin: Secondary | ICD-10-CM | POA: Diagnosis not present

## 2018-12-11 DIAGNOSIS — Z902 Acquired absence of lung [part of]: Secondary | ICD-10-CM | POA: Diagnosis not present

## 2018-12-11 DIAGNOSIS — M19011 Primary osteoarthritis, right shoulder: Secondary | ICD-10-CM | POA: Diagnosis not present

## 2018-12-11 DIAGNOSIS — M75101 Unspecified rotator cuff tear or rupture of right shoulder, not specified as traumatic: Secondary | ICD-10-CM | POA: Diagnosis not present

## 2018-12-11 DIAGNOSIS — Z85118 Personal history of other malignant neoplasm of bronchus and lung: Secondary | ICD-10-CM | POA: Diagnosis not present

## 2018-12-11 DIAGNOSIS — B1920 Unspecified viral hepatitis C without hepatic coma: Secondary | ICD-10-CM | POA: Diagnosis not present

## 2018-12-11 DIAGNOSIS — I10 Essential (primary) hypertension: Secondary | ICD-10-CM | POA: Diagnosis not present

## 2018-12-11 DIAGNOSIS — Z9221 Personal history of antineoplastic chemotherapy: Secondary | ICD-10-CM | POA: Diagnosis not present

## 2018-12-11 DIAGNOSIS — M25811 Other specified joint disorders, right shoulder: Secondary | ICD-10-CM | POA: Diagnosis not present

## 2018-12-11 DIAGNOSIS — Z1159 Encounter for screening for other viral diseases: Secondary | ICD-10-CM | POA: Diagnosis not present

## 2018-12-11 DIAGNOSIS — E1142 Type 2 diabetes mellitus with diabetic polyneuropathy: Secondary | ICD-10-CM | POA: Diagnosis not present

## 2018-12-11 DIAGNOSIS — Z79899 Other long term (current) drug therapy: Secondary | ICD-10-CM | POA: Diagnosis not present

## 2018-12-11 DIAGNOSIS — Z7982 Long term (current) use of aspirin: Secondary | ICD-10-CM | POA: Diagnosis not present

## 2018-12-11 DIAGNOSIS — F17210 Nicotine dependence, cigarettes, uncomplicated: Secondary | ICD-10-CM | POA: Diagnosis not present

## 2018-12-11 DIAGNOSIS — M94211 Chondromalacia, right shoulder: Secondary | ICD-10-CM | POA: Diagnosis not present

## 2018-12-11 DIAGNOSIS — J449 Chronic obstructive pulmonary disease, unspecified: Secondary | ICD-10-CM | POA: Diagnosis not present

## 2018-12-11 HISTORY — DX: Type 2 diabetes mellitus without complications: E11.9

## 2018-12-11 HISTORY — DX: Unspecified mononeuropathy of bilateral lower limbs: G57.93

## 2018-12-11 HISTORY — DX: Unspecified rotator cuff tear or rupture of unspecified shoulder, not specified as traumatic: M75.100

## 2018-12-11 LAB — COMPREHENSIVE METABOLIC PANEL
ALT: 39 U/L (ref 0–44)
AST: 32 U/L (ref 15–41)
Albumin: 4.3 g/dL (ref 3.5–5.0)
Alkaline Phosphatase: 33 U/L — ABNORMAL LOW (ref 38–126)
Anion gap: 8 (ref 5–15)
BUN: 17 mg/dL (ref 8–23)
CO2: 27 mmol/L (ref 22–32)
Calcium: 9.3 mg/dL (ref 8.9–10.3)
Chloride: 97 mmol/L — ABNORMAL LOW (ref 98–111)
Creatinine, Ser: 1.22 mg/dL (ref 0.61–1.24)
GFR calc Af Amer: >60 mL/min (ref >60)
GFR calc non Af Amer: >60 mL/min (ref >60)
Glucose, Bld: 148 mg/dL — ABNORMAL HIGH (ref 70–99)
Potassium: 4.6 mmol/L (ref 3.5–5.1)
Sodium: 132 mmol/L — ABNORMAL LOW (ref 135–145)
Total Bilirubin: 1 mg/dL (ref 0.3–1.2)
Total Protein: 7.2 g/dL (ref 6.5–8.1)

## 2018-12-11 LAB — GLUCOSE, CAPILLARY: Glucose-Capillary: 161 mg/dL — ABNORMAL HIGH (ref 70–99)

## 2018-12-11 LAB — HEMOGLOBIN A1C
Hgb A1c MFr Bld: 5.8 % — ABNORMAL HIGH (ref 4.8–5.6)
Mean Plasma Glucose: 119.76 mg/dL

## 2018-12-11 LAB — CBC
HCT: 41.6 % (ref 39.0–52.0)
Hemoglobin: 14.2 g/dL (ref 13.0–17.0)
MCH: 34.4 pg — ABNORMAL HIGH (ref 26.0–34.0)
MCHC: 34.1 g/dL (ref 30.0–36.0)
MCV: 100.7 fL — ABNORMAL HIGH (ref 80.0–100.0)
Platelets: 260 10*3/uL (ref 150–400)
RBC: 4.13 MIL/uL — ABNORMAL LOW (ref 4.22–5.81)
RDW: 11.6 % (ref 11.5–15.5)
WBC: 8.2 10*3/uL (ref 4.0–10.5)
nRBC: 0 % (ref 0.0–0.2)

## 2018-12-12 LAB — SARS CORONAVIRUS 2 (TAT 6-24 HRS): SARS Coronavirus 2: NEGATIVE

## 2018-12-13 NOTE — Anesthesia Preprocedure Evaluation (Addendum)
Anesthesia Evaluation  Patient identified by MRN, date of birth, ID band Patient awake    Reviewed: Allergy & Precautions, NPO status , Patient's Chart, lab work & pertinent test results  History of Anesthesia Complications Negative for: history of anesthetic complications  Airway Mallampati: III  TM Distance: <3 FB     Dental  (+) Dental Advisory Given   Pulmonary COPD,  COPD inhaler, Current Smoker,   Lung cancer s/p left pneumonectomy      Absent left sided breath sounds d/t pneumonectomy     + decreased breath sounds      Cardiovascular hypertension, (-) angina Rhythm:Regular Rate:Normal     Neuro/Psych  Neuromuscular disease (b/l feet neuropathy) negative psych ROS   GI/Hepatic negative GI ROS, (+) Hepatitis -, C  Endo/Other  diabetes, Well Controlled, Type 2  Renal/GU negative Renal ROS     Musculoskeletal negative musculoskeletal ROS (+)   Abdominal   Peds  Hematology negative hematology ROS (+)   Anesthesia Other Findings   Reproductive/Obstetrics                            Anesthesia Physical Anesthesia Plan  ASA: III  Anesthesia Plan: General   Post-op Pain Management:  Regional for Post-op pain   Induction: Intravenous  PONV Risk Score and Plan: 3 and Treatment may vary due to age or medical condition, Ondansetron and Dexamethasone  Airway Management Planned: Oral ETT  Additional Equipment: None  Intra-op Plan:   Post-operative Plan: Extubation in OR  Informed Consent: I have reviewed the patients History and Physical, chart, labs and discussed the procedure including the risks, benefits and alternatives for the proposed anesthesia with the patient or authorized representative who has indicated his/her understanding and acceptance.     Dental advisory given  Plan Discussed with: CRNA and Anesthesiologist  Anesthesia Plan Comments: ( Lengthy discussion  had regarding nerve block. Patient with left-sided pneumonectomy, would need right interscalene nerve block for procedure with risk of right diaphragmatic paralysis or weakness. Patient on home narcotics at baseline, concern for pain control postoperatively. After discussing risks and benefits, patient adamant he wants nerve block. Will avoid exparel in case of phrenic nerve involvement with nerve block.)      Anesthesia Quick Evaluation

## 2018-12-14 ENCOUNTER — Other Ambulatory Visit: Payer: Self-pay

## 2018-12-14 ENCOUNTER — Ambulatory Visit (HOSPITAL_COMMUNITY): Payer: Medicare Other | Admitting: Anesthesiology

## 2018-12-14 ENCOUNTER — Encounter (HOSPITAL_COMMUNITY)
Admission: RE | Disposition: A | Discharge status: Home / Self Care | Payer: Self-pay | Source: Other Acute Inpatient Hospital | Attending: Orthopedic Surgery

## 2018-12-14 ENCOUNTER — Encounter (HOSPITAL_COMMUNITY): Payer: Self-pay | Admitting: *Deleted

## 2018-12-14 ENCOUNTER — Observation Stay (HOSPITAL_COMMUNITY)
Admission: RE | Admit: 2018-12-14 | Discharge: 2018-12-15 | Disposition: A | Discharge status: Home / Self Care | Payer: Medicare Other | Source: Other Acute Inpatient Hospital | Attending: Orthopedic Surgery | Admitting: Orthopedic Surgery

## 2018-12-14 ENCOUNTER — Ambulatory Visit (HOSPITAL_COMMUNITY): Payer: Medicare Other | Admitting: Physician Assistant

## 2018-12-14 DIAGNOSIS — Z1159 Encounter for screening for other viral diseases: Secondary | ICD-10-CM | POA: Diagnosis not present

## 2018-12-14 DIAGNOSIS — M25811 Other specified joint disorders, right shoulder: Secondary | ICD-10-CM | POA: Diagnosis not present

## 2018-12-14 DIAGNOSIS — B1920 Unspecified viral hepatitis C without hepatic coma: Secondary | ICD-10-CM | POA: Insufficient documentation

## 2018-12-14 DIAGNOSIS — I10 Essential (primary) hypertension: Secondary | ICD-10-CM | POA: Insufficient documentation

## 2018-12-14 DIAGNOSIS — M19011 Primary osteoarthritis, right shoulder: Secondary | ICD-10-CM | POA: Insufficient documentation

## 2018-12-14 DIAGNOSIS — Z9221 Personal history of antineoplastic chemotherapy: Secondary | ICD-10-CM | POA: Insufficient documentation

## 2018-12-14 DIAGNOSIS — E1142 Type 2 diabetes mellitus with diabetic polyneuropathy: Secondary | ICD-10-CM | POA: Insufficient documentation

## 2018-12-14 DIAGNOSIS — J449 Chronic obstructive pulmonary disease, unspecified: Secondary | ICD-10-CM | POA: Insufficient documentation

## 2018-12-14 DIAGNOSIS — Z9889 Other specified postprocedural states: Secondary | ICD-10-CM | POA: Diagnosis present

## 2018-12-14 DIAGNOSIS — F17210 Nicotine dependence, cigarettes, uncomplicated: Secondary | ICD-10-CM | POA: Insufficient documentation

## 2018-12-14 DIAGNOSIS — Z7982 Long term (current) use of aspirin: Secondary | ICD-10-CM | POA: Insufficient documentation

## 2018-12-14 DIAGNOSIS — M75101 Unspecified rotator cuff tear or rupture of right shoulder, not specified as traumatic: Principal | ICD-10-CM | POA: Insufficient documentation

## 2018-12-14 DIAGNOSIS — Z902 Acquired absence of lung [part of]: Secondary | ICD-10-CM | POA: Insufficient documentation

## 2018-12-14 DIAGNOSIS — M94211 Chondromalacia, right shoulder: Secondary | ICD-10-CM | POA: Insufficient documentation

## 2018-12-14 DIAGNOSIS — R200 Anesthesia of skin: Secondary | ICD-10-CM | POA: Insufficient documentation

## 2018-12-14 DIAGNOSIS — Z85118 Personal history of other malignant neoplasm of bronchus and lung: Secondary | ICD-10-CM | POA: Insufficient documentation

## 2018-12-14 DIAGNOSIS — Z79899 Other long term (current) drug therapy: Secondary | ICD-10-CM | POA: Insufficient documentation

## 2018-12-14 HISTORY — PX: SHOULDER ARTHROSCOPY WITH ROTATOR CUFF REPAIR: SHX5685

## 2018-12-14 HISTORY — PX: SHOULDER ARTHROSCOPY WITH SUBACROMIAL DECOMPRESSION: SHX5684

## 2018-12-14 HISTORY — PX: SHOULDER ARTHROSCOPY WITH DISTAL CLAVICLE RESECTION: SHX5675

## 2018-12-14 LAB — GLUCOSE, CAPILLARY
Glucose-Capillary: 96 mg/dL (ref 70–99)
Glucose-Capillary: 115 mg/dL — ABNORMAL HIGH (ref 70–99)
Glucose-Capillary: 149 mg/dL — ABNORMAL HIGH (ref 70–99)

## 2018-12-14 SURGERY — ARTHROSCOPY, SHOULDER, WITH ROTATOR CUFF REPAIR
Anesthesia: General | Laterality: Right

## 2018-12-14 MED ORDER — ONDANSETRON HCL 4 MG/2ML IJ SOLN: 4.0000 mg | Freq: Once | INTRAMUSCULAR | Status: DC | PRN

## 2018-12-14 MED ORDER — CEFAZOLIN SODIUM-DEXTROSE 2-4 GM/100ML-% IV SOLN
2.0000 g | INTRAVENOUS | Status: DC
  Filled 2018-12-14: qty 100

## 2018-12-14 MED ORDER — ALUMINUM HYDROXIDE GEL 320 MG/5ML PO SUSP
15.0000 mL | ORAL | Status: DC | PRN
  Filled 2018-12-14: qty 30

## 2018-12-14 MED ORDER — PHENYLEPHRINE HCL (PRESSORS) 10 MG/ML IV SOLN
INTRAVENOUS | Status: DC | PRN
  Administered 2018-12-14 (×2): 80 ug via INTRAVENOUS

## 2018-12-14 MED ORDER — ROCURONIUM BROMIDE 10 MG/ML (PF) SYRINGE
PREFILLED_SYRINGE | INTRAVENOUS | Status: DC | PRN
  Administered 2018-12-14: 45 mg via INTRAVENOUS
  Administered 2018-12-14: 5 mg via INTRAVENOUS

## 2018-12-14 MED ORDER — SODIUM CHLORIDE 0.9 % IV SOLN
INTRAVENOUS | Status: DC | PRN
  Administered 2018-12-14: 08:00:00 25 ug/min via INTRAVENOUS

## 2018-12-14 MED ORDER — PHENYLEPHRINE HCL (PRESSORS) 10 MG/ML IV SOLN
INTRAVENOUS | Status: AC
  Filled 2018-12-14: qty 1

## 2018-12-14 MED ORDER — HYDROCODONE-ACETAMINOPHEN 7.5-325 MG PO TABS
1.0000 | ORAL_TABLET | ORAL | Status: DC | PRN
  Administered 2018-12-14 – 2018-12-15 (×3): 2 via ORAL
  Filled 2018-12-14 (×3): qty 2

## 2018-12-14 MED ORDER — HYDROCODONE-ACETAMINOPHEN 5-325 MG PO TABS
1.0000 | ORAL_TABLET | ORAL | Status: DC | PRN
  Administered 2018-12-15: 2 via ORAL
  Filled 2018-12-14: qty 2

## 2018-12-14 MED ORDER — DEXAMETHASONE SODIUM PHOSPHATE 10 MG/ML IJ SOLN
INTRAMUSCULAR | Status: AC
  Filled 2018-12-14: qty 1

## 2018-12-14 MED ORDER — SODIUM CHLORIDE 0.9 % IV SOLN
INTRAVENOUS | Status: DC
  Administered 2018-12-15: 01:00:00 via INTRAVENOUS

## 2018-12-14 MED ORDER — MIDAZOLAM HCL 2 MG/2ML IJ SOLN
INTRAMUSCULAR | Status: AC
  Filled 2018-12-14: qty 2

## 2018-12-14 MED ORDER — HYDROCODONE-ACETAMINOPHEN 7.5-325 MG PO TABS
ORAL_TABLET | ORAL | Status: AC
  Administered 2018-12-14: 1
  Filled 2018-12-14: qty 1

## 2018-12-14 MED ORDER — METHOCARBAMOL 1000 MG/10ML IJ SOLN
500.0000 mg | Freq: Four times a day (QID) | INTRAVENOUS | Status: DC | PRN
  Filled 2018-12-14: qty 5

## 2018-12-14 MED ORDER — LIDOCAINE 2% (20 MG/ML) 5 ML SYRINGE
INTRAMUSCULAR | Status: DC | PRN
  Administered 2018-12-14: 75 mg via INTRAVENOUS
  Administered 2018-12-14: 25 mg via INTRAVENOUS

## 2018-12-14 MED ORDER — DIPHENHYDRAMINE HCL 12.5 MG/5ML PO ELIX: 12.5000 mg | ORAL_SOLUTION | ORAL | Status: DC | PRN

## 2018-12-14 MED ORDER — IPRATROPIUM-ALBUTEROL 0.5-2.5 (3) MG/3ML IN SOLN
3.0000 mL | RESPIRATORY_TRACT | Status: DC | PRN
  Administered 2018-12-14: 3 mL via RESPIRATORY_TRACT
  Filled 2018-12-14: qty 3

## 2018-12-14 MED ORDER — MENTHOL 3 MG MT LOZG: 1.0000 | LOZENGE | OROMUCOSAL | Status: DC | PRN

## 2018-12-14 MED ORDER — ALPRAZOLAM 1 MG PO TABS
1.0000 mg | ORAL_TABLET | Freq: Two times a day (BID) | ORAL | Status: DC
  Administered 2018-12-14 – 2018-12-15 (×2): 1 mg via ORAL
  Filled 2018-12-14 (×2): qty 1

## 2018-12-14 MED ORDER — PHENYLEPHRINE 40 MCG/ML (10ML) SYRINGE FOR IV PUSH (FOR BLOOD PRESSURE SUPPORT)
PREFILLED_SYRINGE | INTRAVENOUS | Status: AC
  Filled 2018-12-14: qty 10

## 2018-12-14 MED ORDER — ACETAMINOPHEN 325 MG PO TABS: 325.0000 mg | ORAL_TABLET | Freq: Four times a day (QID) | ORAL | Status: DC | PRN

## 2018-12-14 MED ORDER — MORPHINE SULFATE (PF) 2 MG/ML IV SOLN
0.5000 mg | INTRAVENOUS | Status: DC | PRN
  Administered 2018-12-14: 0.5 mg via INTRAVENOUS
  Filled 2018-12-14: qty 1

## 2018-12-14 MED ORDER — ONDANSETRON HCL 4 MG/2ML IJ SOLN: 4.0000 mg | Freq: Four times a day (QID) | INTRAMUSCULAR | Status: DC | PRN

## 2018-12-14 MED ORDER — FENTANYL CITRATE (PF) 250 MCG/5ML IJ SOLN
INTRAMUSCULAR | Status: AC
  Filled 2018-12-14: qty 5

## 2018-12-14 MED ORDER — OXYCODONE HCL 5 MG/5ML PO SOLN: 5.0000 mg | Freq: Once | ORAL | Status: DC | PRN

## 2018-12-14 MED ORDER — DEXAMETHASONE SODIUM PHOSPHATE 10 MG/ML IJ SOLN
INTRAMUSCULAR | Status: DC | PRN
  Administered 2018-12-14: 8 mg via INTRAVENOUS

## 2018-12-14 MED ORDER — PROPOFOL 10 MG/ML IV BOLUS
INTRAVENOUS | Status: AC
  Filled 2018-12-14: qty 20

## 2018-12-14 MED ORDER — PHENOL 1.4 % MT LIQD: 1.0000 | OROMUCOSAL | Status: DC | PRN

## 2018-12-14 MED ORDER — ACETAMINOPHEN 500 MG PO TABS
500.0000 mg | ORAL_TABLET | Freq: Four times a day (QID) | ORAL | Status: DC
  Administered 2018-12-15 (×2): 500 mg via ORAL
  Filled 2018-12-14 (×2): qty 1

## 2018-12-14 MED ORDER — FENTANYL CITRATE (PF) 100 MCG/2ML IJ SOLN: 25.0000 ug | INTRAMUSCULAR | Status: DC | PRN

## 2018-12-14 MED ORDER — ROPIVACAINE HCL 7.5 MG/ML IJ SOLN
INTRAMUSCULAR | Status: DC | PRN
  Administered 2018-12-14: 20 mL via PERINEURAL

## 2018-12-14 MED ORDER — METHOCARBAMOL 500 MG PO TABS: 500.0000 mg | ORAL_TABLET | Freq: Four times a day (QID) | ORAL | Status: DC | PRN

## 2018-12-14 MED ORDER — ASPIRIN EC 81 MG PO TBEC
81.0000 mg | DELAYED_RELEASE_TABLET | Freq: Every day | ORAL | Status: DC
  Administered 2018-12-15: 08:00:00 81 mg via ORAL
  Filled 2018-12-14: qty 1

## 2018-12-14 MED ORDER — MIDAZOLAM HCL 5 MG/5ML IJ SOLN
INTRAMUSCULAR | Status: DC | PRN
  Administered 2018-12-14 (×2): 1 mg via INTRAVENOUS

## 2018-12-14 MED ORDER — OXYCODONE HCL 5 MG PO TABS: 5.0000 mg | ORAL_TABLET | ORAL | 0 refills | Status: DC | PRN

## 2018-12-14 MED ORDER — ONDANSETRON HCL 4 MG PO TABS: 4.0000 mg | ORAL_TABLET | Freq: Four times a day (QID) | ORAL | Status: DC | PRN

## 2018-12-14 MED ORDER — LACTATED RINGERS IV SOLN
INTRAVENOUS | Status: DC
  Administered 2018-12-14: 1000 mL via INTRAVENOUS
  Administered 2018-12-14 (×2): via INTRAVENOUS

## 2018-12-14 MED ORDER — IPRATROPIUM-ALBUTEROL 0.5-2.5 (3) MG/3ML IN SOLN
3.0000 mL | Freq: Two times a day (BID) | RESPIRATORY_TRACT | Status: DC
  Filled 2018-12-14: qty 3

## 2018-12-14 MED ORDER — SODIUM CHLORIDE 0.9 % IR SOLN
Status: DC | PRN
  Administered 2018-12-14: 9000 mL

## 2018-12-14 MED ORDER — ONDANSETRON HCL 4 MG/2ML IJ SOLN
INTRAMUSCULAR | Status: AC
  Filled 2018-12-14: qty 2

## 2018-12-14 MED ORDER — CHLORHEXIDINE GLUCONATE 4 % EX LIQD: 60.0000 mL | Freq: Once | CUTANEOUS | Status: DC

## 2018-12-14 MED ORDER — METOCLOPRAMIDE HCL 5 MG/ML IJ SOLN: 5.0000 mg | Freq: Three times a day (TID) | INTRAMUSCULAR | Status: DC | PRN

## 2018-12-14 MED ORDER — PROPOFOL 10 MG/ML IV BOLUS
INTRAVENOUS | Status: DC | PRN
  Administered 2018-12-14: 130 mg via INTRAVENOUS

## 2018-12-14 MED ORDER — SUCCINYLCHOLINE CHLORIDE 200 MG/10ML IV SOSY
PREFILLED_SYRINGE | INTRAVENOUS | Status: DC | PRN
  Administered 2018-12-14: 140 mg via INTRAVENOUS

## 2018-12-14 MED ORDER — ATORVASTATIN CALCIUM 10 MG PO TABS
10.0000 mg | ORAL_TABLET | Freq: Every day | ORAL | Status: DC
  Administered 2018-12-14: 10 mg via ORAL
  Filled 2018-12-14: qty 1

## 2018-12-14 MED ORDER — TIZANIDINE HCL 4 MG PO TABS: 4.0000 mg | ORAL_TABLET | Freq: Three times a day (TID) | ORAL | 1 refills | Status: DC | PRN

## 2018-12-14 MED ORDER — OXYCODONE HCL 5 MG PO TABS: 5.0000 mg | ORAL_TABLET | Freq: Once | ORAL | Status: DC | PRN

## 2018-12-14 MED ORDER — DOCUSATE SODIUM 100 MG PO CAPS
100.0000 mg | ORAL_CAPSULE | Freq: Two times a day (BID) | ORAL | Status: DC
  Administered 2018-12-15: 08:00:00 100 mg via ORAL
  Filled 2018-12-14 (×2): qty 1

## 2018-12-14 MED ORDER — ONDANSETRON HCL 4 MG/2ML IJ SOLN
INTRAMUSCULAR | Status: DC | PRN
  Administered 2018-12-14: 4 mg via INTRAVENOUS

## 2018-12-14 MED ORDER — METOCLOPRAMIDE HCL 5 MG PO TABS: 5.0000 mg | ORAL_TABLET | Freq: Three times a day (TID) | ORAL | Status: DC | PRN

## 2018-12-14 MED ORDER — ZOLPIDEM TARTRATE 5 MG PO TABS: 5.0000 mg | ORAL_TABLET | Freq: Every evening | ORAL | Status: DC | PRN

## 2018-12-14 MED ORDER — FENTANYL CITRATE (PF) 100 MCG/2ML IJ SOLN
INTRAMUSCULAR | Status: DC | PRN
  Administered 2018-12-14 (×2): 50 ug via INTRAVENOUS
  Administered 2018-12-14: 100 ug via INTRAVENOUS
  Administered 2018-12-14: 50 ug via INTRAVENOUS

## 2018-12-14 MED ORDER — SUGAMMADEX SODIUM 200 MG/2ML IV SOLN
INTRAVENOUS | Status: DC | PRN
  Administered 2018-12-14: 200 mg via INTRAVENOUS

## 2018-12-14 SURGICAL SUPPLY — 74 items
AID PSTN UNV HD RSTRNT DISP (MISCELLANEOUS)
ANCH SUT SWLK 19.1X4.75 VT (Anchor) ×4 IMPLANT
ANCHOR PEEK 4.75X19.1 SWLK C (Anchor) ×8 IMPLANT
BLADE SURG SZ11 CARB STEEL (BLADE) ×3 IMPLANT
BOOTIES KNEE HIGH SLOAN (MISCELLANEOUS) ×6 IMPLANT
BUR OVAL 4.0 (BURR) ×3 IMPLANT
BURR OVAL 8 FLU 4.0MM X 13CM (MISCELLANEOUS) ×1
BURR OVAL 8 FLU 4.0X13 (MISCELLANEOUS) ×2 IMPLANT
CANNULA 5.75X71 LONG (CANNULA) IMPLANT
CANNULA TWIST IN 8.25X7CM (CANNULA) ×3 IMPLANT
COOLER ICEMAN CLASSIC (MISCELLANEOUS) IMPLANT
COVER SURGICAL LIGHT HANDLE (MISCELLANEOUS) ×3 IMPLANT
COVER WAND RF STERILE (DRAPES) IMPLANT
CUTTER BONE 4.0MM X 13CM (MISCELLANEOUS) ×3 IMPLANT
DISSECTOR  3.8MM X 13CM (MISCELLANEOUS)
DISSECTOR 3.8MM X 13CM (MISCELLANEOUS) IMPLANT
DRAPE IMP U-DRAPE 54X76 (DRAPES) ×3 IMPLANT
DRAPE INCISE IOBAN 66X45 STRL (DRAPES) IMPLANT
DRAPE ORTHO SPLIT 77X108 STRL (DRAPES) ×6
DRAPE STERI 35X30 U-POUCH (DRAPES) ×3 IMPLANT
DRAPE SURG ORHT 6 SPLT 77X108 (DRAPES) ×2 IMPLANT
DRAPE U-SHAPE 47X51 STRL (DRAPES) ×9 IMPLANT
DRSG PAD ABDOMINAL 8X10 ST (GAUZE/BANDAGES/DRESSINGS) ×4 IMPLANT
DURAPREP 26ML APPLICATOR (WOUND CARE) ×3 IMPLANT
DW OUTFLOW CASSETTE/TUBE SET (MISCELLANEOUS) ×1 IMPLANT
ELECT REM PT RETURN 15FT ADLT (MISCELLANEOUS) ×3 IMPLANT
GAUZE SPONGE 4X4 12PLY STRL (GAUZE/BANDAGES/DRESSINGS) ×3 IMPLANT
GAUZE XEROFORM 1X8 LF (GAUZE/BANDAGES/DRESSINGS) ×3 IMPLANT
GLOVE BIO SURGEON STRL SZ7.5 (GLOVE) ×3 IMPLANT
GLOVE BIOGEL PI IND STRL 8 (GLOVE) ×1 IMPLANT
GLOVE BIOGEL PI INDICATOR 8 (GLOVE) ×2
GOWN STRL REUS W/TWL LRG LVL3 (GOWN DISPOSABLE) ×3 IMPLANT
GOWN STRL REUS W/TWL XL LVL3 (GOWN DISPOSABLE) ×3 IMPLANT
KIT BASIN OR (CUSTOM PROCEDURE TRAY) ×3 IMPLANT
KIT PUSHLOCK 2.9 HIP (KITS) IMPLANT
KIT TURNOVER KIT A (KITS) IMPLANT
LASSO 90 CVE QUICKPAS (DISPOSABLE) IMPLANT
LASSO CRESCENT QUICKPASS (SUTURE) IMPLANT
MANIFOLD NEPTUNE II (INSTRUMENTS) ×3 IMPLANT
NDL HYPO 25X1 1.5 SAFETY (NEEDLE) ×1 IMPLANT
NDL SCORPION (NEEDLE) IMPLANT
NDL SCORPION MULTI FIRE (NEEDLE) IMPLANT
NEEDLE HYPO 25X1 1.5 SAFETY (NEEDLE) ×3 IMPLANT
NEEDLE SCORPION (NEEDLE) IMPLANT
NEEDLE SCORPION MULTI FIRE (NEEDLE) ×3 IMPLANT
PACK ARTHROSCOPY DSU (CUSTOM PROCEDURE TRAY) ×3 IMPLANT
PACK SHOULDER (CUSTOM PROCEDURE TRAY) ×3 IMPLANT
PAD COLD SHLDR WRAP-ON (PAD) IMPLANT
PAD ORTHO SHOULDER 7X19 LRG (SOFTGOODS) ×2 IMPLANT
PROBE BIPOLAR ATHRO 135MM 90D (MISCELLANEOUS) ×3 IMPLANT
PROTECTOR NERVE ULNAR (MISCELLANEOUS) ×3 IMPLANT
RESECTOR FULL RADIUS 4.2MM (BLADE) ×3 IMPLANT
RESTRAINT HEAD UNIVERSAL NS (MISCELLANEOUS) IMPLANT
SLING ARM FOAM STRAP LRG (SOFTGOODS) ×3 IMPLANT
SLING ARM FOAM STRAP MED (SOFTGOODS) IMPLANT
SLING ARM IMMOBILIZER LRG (SOFTGOODS) ×3 IMPLANT
SLING ARM IMMOBILIZER MED (SOFTGOODS) IMPLANT
SUPPORT WRAP ARM LG (MISCELLANEOUS) ×3 IMPLANT
SUT ETHILON 3 0 PS 1 (SUTURE) ×3 IMPLANT
SUT PDS AB 1 CT1 27 (SUTURE) IMPLANT
SUT TIGER TAPE 7 IN WHITE (SUTURE) ×4 IMPLANT
SUTURE TAPE 1.3 40 TPR END (SUTURE) IMPLANT
SUTURETAPE 1.3 40 TPR END (SUTURE)
SYR CONTROL 10ML LL (SYRINGE) ×3 IMPLANT
TAPE FIBER 2MM 7IN #2 BLUE (SUTURE) IMPLANT
TAPE LABRALWHITE 1.5X36 (TAPE) IMPLANT
TAPE SUT LABRALTAP WHT/BLK (SUTURE) IMPLANT
TOWEL OR 17X26 10 PK STRL BLUE (TOWEL DISPOSABLE) ×3 IMPLANT
TOWEL OR NON WOVEN STRL DISP B (DISPOSABLE) ×3 IMPLANT
TUBING ARTHRO INFLOW-ONLY STRL (TUBING) IMPLANT
TUBING ARTHROSCOPY IRRIG 16FT (MISCELLANEOUS) ×3 IMPLANT
TUBING CONNECTING 10 (TUBING) ×4 IMPLANT
TUBING CONNECTING 10' (TUBING) ×2
WAND HAND CNTRL MULTIVAC 90 (MISCELLANEOUS) ×3 IMPLANT

## 2018-12-14 NOTE — Anesthesia Procedure Notes (Addendum)
Anesthesia Regional Block: Interscalene brachial plexus block   Pre-Anesthetic Checklist: ,, timeout performed, Correct Patient, Correct Site, Correct Laterality, Correct Procedure, Correct Position, site marked, Risks and benefits discussed,  Surgical consent,  Pre-op evaluation,  At surgeon's request and post-op pain management  Laterality: Right  Prep: chloraprep       Needles:  Injection technique: Single-shot  Needle Type: Echogenic Needle     Needle Length: 5cm  Needle Gauge: 21     Additional Needles:   Narrative:  Start time: 12/14/2018 7:11 AM End time: 12/14/2018 7:14 AM Injection made incrementally with aspirations every 5 mL.  Performed by: Personally  Anesthesiologist: Audry Pili, MD  Additional Notes: No pain on injection. No increased resistance to injection. Injection made in 5cc increments. Good needle visualization. Patient tolerated the procedure well.

## 2018-12-14 NOTE — Progress Notes (Signed)
PACU note; unable to wean pt from nasal oxygen at present; SAO2 92-93% on 2 liters O2 but SAO2 drops to 88 without nasal O2; Dr. Fransisco Beau, anesthesiologist, made aware; will continue to observe and maintain O2 and Incentive spirometer

## 2018-12-14 NOTE — Anesthesia Procedure Notes (Signed)
Procedure Name: Intubation Date/Time: 12/14/2018 7:41 AM Performed by: Lissa Morales, CRNA Pre-anesthesia Checklist: Patient identified, Emergency Drugs available, Suction available and Patient being monitored Patient Re-evaluated:Patient Re-evaluated prior to induction Oxygen Delivery Method: Circle system utilized Preoxygenation: Pre-oxygenation with 100% oxygen Induction Type: IV induction Ventilation: Mask ventilation without difficulty Laryngoscope Size: Mac and 4 Grade View: Grade II Tube type: Oral Number of attempts: 1 Airway Equipment and Method: Stylet and Oral airway Placement Confirmation: ETT inserted through vocal cords under direct vision,  positive ETCO2 and breath sounds checked- equal and bilateral Secured at: 23 cm Tube secured with: Tape Dental Injury: Teeth and Oropharynx as per pre-operative assessment

## 2018-12-14 NOTE — Transfer of Care (Signed)
Immediate Anesthesia Transfer of Care Note  Patient: Benjamin Case  Procedure(s) Performed: SHOULDER ARTHROSCOPY WITH ROTATOR CUFF REPAIR (Right ) SHOULDER ARTHROSCOPY WITH SUBACROMIAL DECOMPRESSION (Right ) SHOULDER ARTHROSCOPY WITH DISTAL CLAVICLE RESECTION (Right )  Patient Location: PACU  Anesthesia Type:General  Level of Consciousness: awake, alert , oriented and patient cooperative  Airway & Oxygen Therapy: Patient Spontanous Breathing and Patient connected to face mask oxygen  Post-op Assessment: Report given to RN, Post -op Vital signs reviewed and stable and Patient moving all extremities X 4  Post vital signs: stable  Last Vitals:  Vitals Value Taken Time  BP 140/65 12/14/18 0915  Temp    Pulse 80 12/14/18 0920  Resp    SpO2 100 % 12/14/18 0920  Vitals shown include unvalidated device data.  Last Pain:  Vitals:   12/14/18 0616  TempSrc: Oral         Complications: No apparent anesthesia complications

## 2018-12-14 NOTE — Progress Notes (Signed)
Pt voided in urinal; O2 sat dropped to 86% with exertion; Nasal O2 up to 3 Liters and deep breathing encouraged with SAO2 up to 95; pt now resting and nasal O2 back to 2 liters

## 2018-12-14 NOTE — Plan of Care (Signed)

## 2018-12-14 NOTE — H&P (Signed)
Benjamin Case is an 63 y.o. male.   Chief Complaint: R shoulder pain  HPI: R shoulder pain with partial RCT, impingement and AC DJD.  Failed conservative management.  Past Medical History:  Diagnosis Date  . Diabetes mellitus without complication (HCC)    no medications , monitors cbg at home with monitor, avg no more than 150s at home   . Dyspnea    increased exertion; pt states can climb flight of stairs w/o having to stop prior to reaching top ; ongoing reports it is due to "having one lung"   . Hepatitis    hepatitis C; pt states competed treatments, unsure what medication he was treated with   . History of chemotherapy   . Hypertension    reports its been over a year since he stopped taking bp medication, reports he made her medical doctor aware that he was topping it   . lung ca dx'd 12/2007   lt lobectomy  . Neuropathy of both feet   . Numbness    FEET BILATERAL  . Rotator cuff tear    right     Past Surgical History:  Procedure Laterality Date  . ARTHROSCOPIC REPAIR ACL Right   . CATARACT EXTRACTION, BILATERAL  2019  . DECOMPRESSIVE LUMBAR LAMINECTOMY LEVEL 1 N/A 03/24/2016   Procedure: L3-4 CENTRAL DECOMPRESSION LUMBER LAMINECTOMY;  Surgeon: Latanya Maudlin, MD;  Location: [facility] ORS;  Service: Orthopedics;  Laterality: N/A;  . FOOT SURGERY Bilateral   . HAND SURGERY Left   . HAND SURGERY Right   . LOBECTOMY Left 2009  . TRIGGER FINGER RELEASE     right hand, pointer finger     Family History  Problem Relation Age of Onset  . Cancer Mother    Social History:  reports that he has been smoking cigarettes. He has a 22.50 pack-year smoking history. He has never used smokeless tobacco. He reports current alcohol use. He reports that he does not use drugs.  Allergies: No Known Allergies  Medications Prior to Admission  Medication Sig Dispense Refill  . ALPRAZolam (XANAX) 1 MG tablet Take 1 mg by mouth 2 (two) times daily.  5  . Ascorbic Acid (VITAMIN C) 1000 MG tablet  Take 1,000 mg by mouth daily.    Marland Kitchen aspirin EC 81 MG tablet Take 81 mg by mouth daily.    Marland Kitchen atorvastatin (LIPITOR) 10 MG tablet Take 10 mg by mouth at bedtime.     . B Complex-C (SUPER B COMPLEX PO) Take 1 tablet by mouth daily.    Marland Kitchen esomeprazole (NEXIUM) 40 MG capsule Take 40 mg by mouth daily as needed (for heartburn).     Marland Kitchen ipratropium-albuterol (DUONEB) 0.5-2.5 (3) MG/3ML SOLN Take 3 mLs by nebulization every 4 (four) hours as needed (for shortness of breath/wheezing.).     Marland Kitchen Omega-3 Fatty Acids ([employer] FISH OIL) 1000 MG CAPS Take by mouth.    . oxyCODONE-acetaminophen (PERCOCET) 10-325 MG tablet Take 1 tablet by mouth every 6 (six) hours as needed for pain.    . vitamin B-12 (CYANOCOBALAMIN) 1000 MCG tablet Take 1,000 mcg by mouth daily.      Results for orders placed or performed during the hospital encounter of 12/14/18 (from the past 48 hour(s))  Glucose, capillary     Status: None   Collection Time: 12/14/18  6:00 AM  Result Value Ref Range   Glucose-Capillary 96 70 - 99 mg/dL   No results found.  Review of Systems  All other systems  reviewed and are negative.   Blood pressure 110/65, pulse 67, temperature 97.8 F (36.6 C), temperature source Oral, resp. rate 15, SpO2 97 %. Physical Exam  Constitutional: He is oriented to person, place, and time. He appears well-developed and well-nourished.  HENT:  Head: Atraumatic.  Eyes: EOM are normal.  Cardiovascular: Intact distal pulses.  Respiratory: Effort normal.  Musculoskeletal:     Comments: R shoulder pain with RC testing, TTP at [facility] joint.  Neurological: He is alert and oriented to person, place, and time.  Skin: Skin is warm and dry.  Psychiatric: He has a normal mood and affect.     Assessment/Plan R shoulder pain with partial RCT, impingement and AC DJD.  Failed conservative management.  Plan R shoulder arth RCR vs RCD, SAD, DCE Risks / benefits of surgery discussed Consent on chart  NPO for OR Preop  antibiotics   Isabella Stalling, MD 12/14/2018, 7:21 AM

## 2018-12-14 NOTE — Op Note (Signed)
Procedure(s): SHOULDER ARTHROSCOPY WITH ROTATOR CUFF REPAIR SHOULDER ARTHROSCOPY WITH SUBACROMIAL DECOMPRESSION SHOULDER ARTHROSCOPY WITH DISTAL CLAVICLE RESECTION Procedure Note  Benjamin Case male 63 y.o. 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  Postoperative diagnosis: Same   Procedure(s) and Anesthesia Type:    * SHOULDER ARTHROSCOPY WITH ROTATOR CUFF REPAIR - Choice    * SHOULDER ARTHROSCOPY WITH SUBACROMIAL DECOMPRESSION - Choice    * SHOULDER ARTHROSCOPY WITH DISTAL CLAVICLE RESECTION - Choice  Surgeon(s) and Role:    Tania Ade, MD - Primary     Surgeon: Isabella Stalling   Assistants: Jeanmarie Hubert PA-C (Danielle was present and scrubbed throughout the procedure and was essential in positioning, assisting with the camera and instrumentation,, and closure)  Anesthesia: General endotracheal anesthesia with preoperative interscalene block given by the attending anesthesiologist    Procedure Detail  SHOULDER ARTHROSCOPY WITH ROTATOR CUFF REPAIR, SHOULDER ARTHROSCOPY WITH SUBACROMIAL DECOMPRESSION, SHOULDER ARTHROSCOPY WITH DISTAL CLAVICLE RESECTION  Estimated Blood Loss: Min         Drains: none  Blood Given: none         Specimens: none        Complications:  * No complications entered in OR log *         Disposition: PACU - hemodynamically stable.         Condition: stable    Procedure:   INDICATIONS FOR SURGERY: The patient is 64 y.o. male who has had a long history of right shoulder pain which is been refractory to nonoperative management.  Indicated for surgical treatment to decrease pain and restore function.  OPERATIVE FINDINGS: Examination under anesthesia: No stiffness or instability   DESCRIPTION OF PROCEDURE: The patient was identified in preoperative  holding area where I personally marked the  operative site after  verifying site, side, and procedure with the patient. An interscalene block was given by the attending anesthesiologist the holding area.  The patient was taken back to the operating room where general anesthesia was induced without complication and was placed in the beach-chair position with the back  elevated about 60 degrees and all extremities and head and neck carefully padded and  positioned.   The right upper extremity was then prepped and  draped in a standard sterile fashion. The appropriate time-out  procedure was carried out. The patient did receive IV antibiotics  within 30 minutes of incision.   A small posterior portal incision was made and the arthroscope was introduced into the joint. An anterior portal was then established above the subscapularis using needle localization. Small cannula was placed anteriorly. Diagnostic arthroscopy was then carried out.  The subscapularis was noted to have some longitudinal tearing but no detachment.  Superior labrum had some flap tears which were debrided back.  The biceps tendon was pulled into the joint and looked good.  The glenohumeral joint surfaces were intact without significant chondromalacia.  The undersurface of the supraspinatus was noted to have a full-thickness minimally retracted tear.  The arthroscope was then introduced into the subacromial space a standard lateral portal was established with needle localization. The shaver was used through the lateral portal to perform extensive bursectomy. Coracoacromial ligament was examined and found to be frayed indicating chronic impingement.  The bursal side of the rotator cuff tear was identified and debrided back to healthy tendon.  The tear involve the entire supraspinatus insertion.  The  tuberosity was debrided down to a bleeding surface to promote healing and repair was then carried out by first placing 2 4.75 peek swivel lock anchors just off the articular margin  anterior and posterior in the tear.  These were preloaded with fiber tape.  These 4 suture strands were then passed anterior to posterior evenly throughout the tear and brought over to 2 additional 4.75 peek swivel lock anchors in the lateral row crossing suture bridge pattern.  This brought the tendon down nicely over the tuberosity.  The repair was felt to be complete and watertight.  The coracoacromial ligament was taken down off the anterior acromion with the ArthroCare exposing a large hooked anterior acromial spur. A high-speed bur was then used through the lateral portal to take down the anterior acromial spur from lateral to medial in a standard acromioplasty.  The acromioplasty was also viewed from the lateral portal and the bur was used as necessary to ensure that the acromion was completely flat from posterior to anterior.  The distal clavicle was exposed arthroscopically and the bur was used to take off the undersurface for approximately 8 mm from the lateral portal. The bur was then moved to an anterior portal position to complete the distal clavicle excision resecting about 8 mm of the distal clavicle and a smooth even fashion. This was viewed from anterior and lateral portals and felt to be complete.  The arthroscopic equipment was removed from the joint and the portals were closed with 3-0 nylon in an interrupted fashion. Sterile dressings were then applied including Xeroform 4 x 4's ABDs and tape. The patient was then allowed to awaken from general anesthesia, placed in a sling, transferred to the stretcher and taken to the recovery room in stable condition.   POSTOPERATIVE PLAN: The patient will be discharged home today and will followup in one week for suture removal and wound check.  He will follow the standard cuff protocol.

## 2018-12-14 NOTE — Progress Notes (Signed)
Pt says he does not use cpap at home and does not want cpap here.

## 2018-12-14 NOTE — Anesthesia Postprocedure Evaluation (Signed)
Anesthesia Post Note  Patient: Benjamin Case  Procedure(s) Performed: SHOULDER ARTHROSCOPY WITH ROTATOR CUFF REPAIR (Right ) SHOULDER ARTHROSCOPY WITH SUBACROMIAL DECOMPRESSION (Right ) SHOULDER ARTHROSCOPY WITH DISTAL CLAVICLE RESECTION (Right )     Patient location during evaluation: PACU Anesthesia Type: General Level of consciousness: awake and alert Pain management: pain level controlled Vital Signs Assessment: post-procedure vital signs reviewed and stable Respiratory status: spontaneous breathing, nonlabored ventilation and patient connected to nasal cannula oxygen Cardiovascular status: blood pressure returned to baseline and stable Postop Assessment: no apparent nausea or vomiting Anesthetic complications: no Comments: Unable to maintain adequate SaO2 without supplemental oxygen, weak effort on incentive spirometer, likely related to involvement of phrenic nerve from interscalene block. Discussed with Dr. Tamera Punt and patient, patient to be admitted for overnight observation.    Last Vitals:  Vitals:   12/14/18 1030 12/14/18 1100  BP: (!) 152/80 (!) 161/81  Pulse: 95 85  Resp: 16 15  Temp: 36.6 C (!) 36.4 C  SpO2: 96% 96%    Last Pain:  Vitals:   12/14/18 1015  TempSrc:   PainSc: 0-No pain                 Audry Pili

## 2018-12-14 NOTE — Plan of Care (Signed)
  Problem: Education: Goal: Knowledge of General Education information will improve Description: Including pain rating scale, medication(s)/side effects and non-pharmacologic comfort measures 12/14/2018 1934 by Hubert Azure, RN Outcome: Progressing 12/14/2018 1448 by Hubert Azure, RN Outcome: Progressing   Problem: Health Behavior/Discharge Planning: Goal: Ability to manage health-related needs will improve 12/14/2018 1934 by Hubert Azure, RN Outcome: Progressing 12/14/2018 1448 by Hubert Azure, RN Outcome: Progressing   Problem: Clinical Measurements: Goal: Ability to maintain clinical measurements within normal limits will improve 12/14/2018 1934 by Hubert Azure, RN Outcome: Progressing 12/14/2018 1448 by Hubert Azure, RN Outcome: Progressing Goal: Will remain free from infection 12/14/2018 1934 by Hubert Azure, RN Outcome: Progressing 12/14/2018 1448 by Hubert Azure, RN Outcome: Progressing Goal: Diagnostic test results will improve 12/14/2018 1934 by Hubert Azure, RN Outcome: Progressing 12/14/2018 1448 by Hubert Azure, RN Outcome: Progressing

## 2018-12-14 NOTE — Discharge Instructions (Signed)

## 2018-12-15 ENCOUNTER — Encounter (HOSPITAL_COMMUNITY): Payer: Self-pay | Admitting: Orthopedic Surgery

## 2018-12-15 DIAGNOSIS — M75101 Unspecified rotator cuff tear or rupture of right shoulder, not specified as traumatic: Secondary | ICD-10-CM | POA: Diagnosis not present

## 2018-12-15 LAB — GLUCOSE, CAPILLARY: Glucose-Capillary: 123 mg/dL — ABNORMAL HIGH (ref 70–99)

## 2018-12-15 NOTE — Discharge Summary (Signed)
Patient ID: KHALIL SZCZEPANIK MRN: [id number] DOB/AGE: Dec 12, 1955 63 y.o.  Admit date: 12/14/2018 Discharge date: 12/15/2018  Admission Diagnoses:  Active Problems:   S/P arthroscopy of right shoulder   Discharge Diagnoses:  Same  Past Medical History:  Diagnosis Date  . Diabetes mellitus without complication (HCC)    no medications , monitors cbg at home with monitor, avg no more than 150s at home   . Dyspnea    increased exertion; pt states can climb flight of stairs w/o having to stop prior to reaching top ; ongoing reports it is due to "having one lung"   . Hepatitis    hepatitis C; pt states competed treatments, unsure what medication he was treated with   . History of chemotherapy   . Hypertension    reports its been over a year since he stopped taking bp medication, reports he made her medical doctor aware that he was topping it   . lung ca dx'd 12/2007   lt lobectomy  . Neuropathy of both feet   . Numbness    FEET BILATERAL  . Rotator cuff tear    right     Surgeries: Procedure(s): SHOULDER ARTHROSCOPY WITH ROTATOR CUFF REPAIR SHOULDER ARTHROSCOPY WITH SUBACROMIAL DECOMPRESSION SHOULDER ARTHROSCOPY WITH DISTAL CLAVICLE RESECTION on 12/14/2018   Consultants:   Discharged Condition: Improved  Hospital Course: Benjamin Case is an 63 y.o. male who was admitted 12/14/2018 for operative treatment of right shoulder arthroscopy with RCR. Patient has severe unremitting pain that affects sleep, daily activities, and work/hobbies. After pre-op clearance the patient was taken to the operating room on 12/14/2018 and underwent  Procedure(s): SHOULDER ARTHROSCOPY WITH ROTATOR CUFF REPAIR SHOULDER ARTHROSCOPY WITH SUBACROMIAL DECOMPRESSION SHOULDER ARTHROSCOPY WITH DISTAL CLAVICLE RESECTION.    Patient was given perioperative antibiotics:  Anti-infectives (From admission, onward)   Start     Dose/Rate Route Frequency Ordered Stop   12/14/18 0600  ceFAZolin (ANCEF) IVPB 2g/100 mL  premix  Status:  Discontinued     2 g 200 mL/hr over 30 Minutes Intravenous On call to O.R. 12/14/18 0547 12/14/18 1337       Patient was given sequential compression devices, early ambulation, and chemoprophylaxis to prevent DVT.  Patient benefited maximally from hospital stay and there were no complications.    Recent vital signs:  Patient Vitals for the past 24 hrs:  BP Temp Temp src Pulse Resp SpO2 Height Weight  12/15/18 0536 102/65 97.8 F (36.6 C) Oral (!) 55 14 100 % - -  12/15/18 0037 112/66 97.9 F (36.6 C) Oral (!) 58 16 100 % - -  12/14/18 2118 130/73 97.6 F (36.4 C) Oral 71 14 100 % - -  12/14/18 1921 - - - - - 96 % - -  12/14/18 1728 (!) 168/89 98 F (36.7 C) Oral (!) 103 16 97 % - -  12/14/18 1550 (!) 158/78 98 F (36.7 C) Oral 91 14 97 % - -  12/14/18 1450 (!) 156/78 97.9 F (36.6 C) Oral 91 16 98 % - -  12/14/18 1400 - - - - - - 5\' 10"  (1.778 m) 87.8 kg  12/14/18 1348 (!) 163/89 97.8 F (36.6 C) Oral 93 16 97 % - -  12/14/18 1300 (!) 153/84 - - - - - - -  12/14/18 1200 (!) 158/81 - - 82 15 97 % - -  12/14/18 1130 (!) 160/85 - - 83 14 97 % - -  12/14/18 1115 (!) 159/86 - - 89  16 90 % - -  12/14/18 1100 (!) 161/81 (!) 97.5 F (36.4 C) - 85 15 96 % - -  12/14/18 1030 (!) 152/80 97.8 F (36.6 C) - 95 16 96 % - -  12/14/18 1015 (!) 150/89 - - 90 15 92 % - -  12/14/18 1000 (!) 153/76 - - 88 14 92 % - -  12/14/18 0945 (!) 154/76 - - 86 15 100 % - -  12/14/18 0930 (!) 157/88 - - 89 16 99 % - -  12/14/18 0915 140/65 - - 80 - 100 % - -  12/14/18 0913 140/70 98.4 F (36.9 C) - 78 15 100 % - -     Recent laboratory studies: No results for input(s): WBC, HGB, HCT, PLT, NA, K, CL, CO2, BUN, CREATININE, GLUCOSE, INR, CALCIUM in the last 72 hours.  Invalid input(s): PT, 2   Discharge Medications:   Allergies as of 12/15/2018   No Known Allergies     Medication List    STOP taking these medications   oxyCODONE-acetaminophen 10-325 MG tablet Commonly known  as: PERCOCET     TAKE these medications   ALPRAZolam 1 MG tablet Commonly known as: XANAX Take 1 mg by mouth 2 (two) times daily.   aspirin EC 81 MG tablet Take 81 mg by mouth daily.   atorvastatin 10 MG tablet Commonly known as: LIPITOR Take 10 mg by mouth at bedtime.   [employer] Fish Oil 1000 MG Caps Take by mouth.   esomeprazole 40 MG capsule Commonly known as: NEXIUM Take 40 mg by mouth daily as needed (for heartburn).   ipratropium-albuterol 0.5-2.5 (3) MG/3ML Soln Commonly known as: DUONEB Take 3 mLs by nebulization every 4 (four) hours as needed (for shortness of breath/wheezing.).   oxyCODONE 5 MG immediate release tablet Commonly known as: Roxicodone Take 1 tablet (5 mg total) by mouth every 4 (four) hours as needed for severe pain.   SUPER B COMPLEX PO Take 1 tablet by mouth daily.   tiZANidine 4 MG tablet Commonly known as: Zanaflex Take 1 tablet (4 mg total) by mouth every 8 (eight) hours as needed for muscle spasms.   vitamin B-12 1000 MCG tablet Commonly known as: CYANOCOBALAMIN Take 1,000 mcg by mouth daily.   vitamin C 1000 MG tablet Take 1,000 mg by mouth daily.       Diagnostic Studies: No results found.  Disposition: Discharge disposition: 01-Home or Self Care       Discharge Instructions    Call MD / Call 911   Complete by: As directed    If you experience chest pain or shortness of breath, CALL 911 and be transported to the hospital emergency room.  If you develope a fever above 101 F, pus (white drainage) or increased drainage or redness at the wound, or calf pain, call your surgeon's office.   Call MD / Call 911   Complete by: As directed    If you experience chest pain or shortness of breath, CALL 911 and be transported to the hospital emergency room.  If you develope a fever above 101 F, pus (white drainage) or increased drainage or redness at the wound, or calf pain, call your surgeon's office.   Call MD / Call 911   Complete by: As  directed    If you experience chest pain or shortness of breath, CALL 911 and be transported to the hospital emergency room.  If you develope a fever above 101 F, pus (white  drainage) or increased drainage or redness at the wound, or calf pain, call your surgeon's office.   Call MD / Call 911   Complete by: As directed    If you experience chest pain or shortness of breath, CALL 911 and be transported to the hospital emergency room.  If you develope a fever above 101 F, pus (white drainage) or increased drainage or redness at the wound, or calf pain, call your surgeon's office.   Constipation Prevention   Complete by: As directed    Drink plenty of fluids.  Prune juice may be helpful.  You may use a stool softener, such as Colace (over the counter) 100 mg twice a day.  Use MiraLax (over the counter) for constipation as needed.   Constipation Prevention   Complete by: As directed    Drink plenty of fluids.  Prune juice may be helpful.  You may use a stool softener, such as Colace (over the counter) 100 mg twice a day.  Use MiraLax (over the counter) for constipation as needed.   Constipation Prevention   Complete by: As directed    Drink plenty of fluids.  Prune juice may be helpful.  You may use a stool softener, such as Colace (over the counter) 100 mg twice a day.  Use MiraLax (over the counter) for constipation as needed.   Constipation Prevention   Complete by: As directed    Drink plenty of fluids.  Prune juice may be helpful.  You may use a stool softener, such as Colace (over the counter) 100 mg twice a day.  Use MiraLax (over the counter) for constipation as needed.   Diet - low sodium heart healthy   Complete by: As directed    Diet - low sodium heart healthy   Complete by: As directed    Diet - low sodium heart healthy   Complete by: As directed    Diet - low sodium heart healthy   Complete by: As directed    Increase activity slowly as tolerated   Complete by: As directed     Increase activity slowly as tolerated   Complete by: As directed    Increase activity slowly as tolerated   Complete by: As directed    Increase activity slowly as tolerated   Complete by: As directed       Follow-up Information    Tania Ade, MD.   Specialty: Orthopedic Surgery Contact information: [place] [address] [phone]        Tania Ade, MD. Schedule an appointment as soon as possible for a visit in 1 week.   Specialty: Orthopedic Surgery Contact information: [place] [address] [phone]            Signed: Grier Mitts 12/15/2018, 8:12 AM

## 2018-12-15 NOTE — Addendum Note (Signed)
Addendum  created 12/15/18 0759 by Lissa Morales, CRNA   Intraprocedure Meds edited

## 2018-12-15 NOTE — Plan of Care (Signed)
  Problem: Education: Goal: Knowledge of General Education information will improve Description: Including pain rating scale, medication(s)/side effects and non-pharmacologic comfort measures 12/15/2018 0829 by Hubert Azure, RN Outcome: Adequate for Discharge 12/14/2018 1934 by Hubert Azure, RN Outcome: Progressing   Problem: Health Behavior/Discharge Planning: Goal: Ability to manage health-related needs will improve 12/15/2018 0829 by Hubert Azure, RN Outcome: Adequate for Discharge 12/14/2018 1934 by Hubert Azure, RN Outcome: Progressing   Problem: Clinical Measurements: Goal: Ability to maintain clinical measurements within normal limits will improve 12/15/2018 0829 by Hubert Azure, RN Outcome: Adequate for Discharge 12/14/2018 1934 by Hubert Azure, RN Outcome: Progressing Goal: Will remain free from infection 12/15/2018 0829 by Hubert Azure, RN Outcome: Adequate for Discharge 12/14/2018 1934 by Hubert Azure, RN Outcome: Progressing Goal: Diagnostic test results will improve 12/15/2018 0829 by Hubert Azure, RN Outcome: Adequate for Discharge 12/14/2018 1934 by Hubert Azure, RN Outcome: Progressing Goal: Respiratory complications will improve Outcome: Adequate for Discharge Goal: Cardiovascular complication will be avoided Outcome: Adequate for Discharge   Problem: Activity: Goal: Risk for activity intolerance will decrease Outcome: Adequate for Discharge   Problem: Nutrition: Goal: Adequate nutrition will be maintained Outcome: Adequate for Discharge   Problem: Coping: Goal: Level of anxiety will decrease Outcome: Adequate for Discharge   Problem: Elimination: Goal: Will not experience complications related to bowel motility Outcome: Adequate for Discharge Goal: Will not experience complications related to urinary retention Outcome: Adequate for Discharge   Problem: Pain Managment: Goal: General experience of comfort will improve Outcome:  Adequate for Discharge   Problem: Safety: Goal: Ability to remain free from injury will improve Outcome: Adequate for Discharge   Problem: Skin Integrity: Goal: Risk for impaired skin integrity will decrease Outcome: Adequate for Discharge   Problem: Education: Goal: Knowledge of the prescribed therapeutic regimen will improve Outcome: Adequate for Discharge Goal: Understanding of activity limitations/precautions following surgery will improve Outcome: Adequate for Discharge Goal: Individualized Educational Video(s) Outcome: Adequate for Discharge   Problem: Activity: Goal: Ability to tolerate increased activity will improve Outcome: Adequate for Discharge   Problem: Pain Management: Goal: Pain level will decrease with appropriate interventions Outcome: Adequate for Discharge

## 2018-12-15 NOTE — Progress Notes (Signed)
   PATIENT ID: Benjamin Case   1 Day Post-Op Procedure(s) (LRB): SHOULDER ARTHROSCOPY WITH ROTATOR CUFF REPAIR (Right) SHOULDER ARTHROSCOPY WITH SUBACROMIAL DECOMPRESSION (Right) SHOULDER ARTHROSCOPY WITH DISTAL CLAVICLE RESECTION (Right)  Subjective: Reports doing well, wants to go home asap. Off O2 last night and reports tolerating that well. No SOB, chest pain.   Objective:  Vitals:   12/15/18 0037 12/15/18 0536  BP: 112/66 102/65  Pulse: (!) 58 (!) 55  Resp: 16 14  Temp: 97.9 F (36.6 C) 97.8 F (36.6 C)  SpO2: 100% 100%     R UE dressing c/d/i Wiggles fingers, distally NVI  Labs:  No results for input(s): HGB in the last 72 hours.No results for input(s): WBC, RBC, HCT, PLT in the last 72 hours.No results for input(s): NA, K, CL, CO2, BUN, CREATININE, GLUCOSE, CALCIUM in the last 72 hours.  Assessment and Plan: 1 day s/p right shoulder RCR Sling Ice machine D/c home now that he has weened off O2 without issue Fu in 1 week   VTE proph: asa, scds

## 2019-04-27 ENCOUNTER — Other Ambulatory Visit: Payer: Self-pay

## 2019-04-27 DIAGNOSIS — Z20822 Contact with and (suspected) exposure to covid-19: Secondary | ICD-10-CM

## 2019-04-30 LAB — NOVEL CORONAVIRUS, NAA: SARS-CoV-2, NAA: NOT DETECTED

## 2019-08-16 ENCOUNTER — Ambulatory Visit: Payer: Medicare Other | Attending: Internal Medicine

## 2019-08-16 DIAGNOSIS — Z23 Encounter for immunization: Secondary | ICD-10-CM

## 2019-08-16 NOTE — Progress Notes (Signed)
   Covid-19 Vaccination Clinic  Name:  Benjamin Case    MRN: [id number] DOB: [birth year]  08/16/2019  [name] was observed post Covid-19 immunization for 15 minutes without incident. He was provided with Vaccine Information Sheet and instruction to access the V-Safe system.   [name] was instructed to call 911 with any severe reactions post vaccine: Marland Kitchen Difficulty breathing  . Swelling of face and throat  . A fast heartbeat  . A bad rash all over body  . Dizziness and weakness   Immunizations Administered    Name Date Dose VIS Date Route   Pfizer COVID-19 Vaccine 08/16/2019 10:47 AM 0.3 mL 05/18/2019 Intramuscular   Manufacturer: [place]   Lot: [id number]   [place]: [address]-5

## 2019-09-10 ENCOUNTER — Ambulatory Visit: Payer: Medicare Other | Attending: Internal Medicine

## 2019-09-10 DIAGNOSIS — Z23 Encounter for immunization: Secondary | ICD-10-CM

## 2019-09-10 NOTE — Progress Notes (Signed)
   Covid-19 Vaccination Clinic  Name:  Benjamin Case    MRN: [id number] DOB: [birth year]  09/10/2019  Benjamin Case was observed post Covid-19 immunization for 15 minutes without incident. He was provided with Vaccine Information Sheet and instruction to access the V-Safe system.   Benjamin Case was instructed to call 911 with any severe reactions post vaccine: Marland Kitchen Difficulty breathing  . Swelling of face and throat  . A fast heartbeat  . A bad rash all over body  . Dizziness and weakness   Immunizations Administered    Name Date Dose VIS Date Route   Pfizer COVID-19 Vaccine 09/10/2019 11:51 AM 0.3 mL 05/18/2019 Intramuscular   Manufacturer: [place]   Lot: [id number]   [place]: [id number]

## 2021-04-09 ENCOUNTER — Other Ambulatory Visit: Payer: Self-pay | Admitting: Family Medicine

## 2021-04-09 DIAGNOSIS — Z85118 Personal history of other malignant neoplasm of bronchus and lung: Secondary | ICD-10-CM

## 2021-04-29 ENCOUNTER — Ambulatory Visit
Admission: RE | Admit: 2021-04-29 | Discharge: 2021-04-29 | Disposition: A | Discharge status: Home / Self Care | Payer: Medicare Other | Source: Ambulatory Visit | Attending: Family Medicine | Admitting: Family Medicine

## 2021-04-29 ENCOUNTER — Other Ambulatory Visit: Payer: Self-pay

## 2021-04-29 DIAGNOSIS — R911 Solitary pulmonary nodule: Secondary | ICD-10-CM

## 2021-04-29 DIAGNOSIS — Z85118 Personal history of other malignant neoplasm of bronchus and lung: Secondary | ICD-10-CM

## 2021-04-29 HISTORY — DX: Solitary pulmonary nodule: R91.1

## 2021-04-29 MED ORDER — IOPAMIDOL (ISOVUE-300) INJECTION 61%
75.0000 mL | Freq: Once | INTRAVENOUS | Status: AC | PRN
  Administered 2021-04-29: 75 mL via INTRAVENOUS

## 2021-05-05 ENCOUNTER — Other Ambulatory Visit (HOSPITAL_COMMUNITY): Payer: Self-pay | Admitting: Family Medicine

## 2021-05-05 ENCOUNTER — Other Ambulatory Visit: Payer: Self-pay | Admitting: Family Medicine

## 2021-05-05 DIAGNOSIS — Z85118 Personal history of other malignant neoplasm of bronchus and lung: Secondary | ICD-10-CM

## 2021-05-05 DIAGNOSIS — R918 Other nonspecific abnormal finding of lung field: Secondary | ICD-10-CM

## 2021-05-05 DIAGNOSIS — F17200 Nicotine dependence, unspecified, uncomplicated: Secondary | ICD-10-CM

## 2021-05-11 ENCOUNTER — Telehealth: Payer: Self-pay | Admitting: Pulmonary Disease

## 2021-05-11 ENCOUNTER — Encounter: Payer: Self-pay | Admitting: Pulmonary Disease

## 2021-05-11 ENCOUNTER — Other Ambulatory Visit: Payer: Self-pay

## 2021-05-11 ENCOUNTER — Ambulatory Visit (INDEPENDENT_AMBULATORY_CARE_PROVIDER_SITE_OTHER): Payer: Medicare Other | Admitting: Pulmonary Disease

## 2021-05-11 VITALS — BP 128/80 | HR 96 | Temp 98.1°F | Ht 70.0 in | Wt 192.4 lb

## 2021-05-11 DIAGNOSIS — R918 Other nonspecific abnormal finding of lung field: Secondary | ICD-10-CM | POA: Diagnosis not present

## 2021-05-11 DIAGNOSIS — Z9889 Other specified postprocedural states: Secondary | ICD-10-CM

## 2021-05-11 DIAGNOSIS — Z902 Acquired absence of lung [part of]: Secondary | ICD-10-CM

## 2021-05-11 DIAGNOSIS — Z85118 Personal history of other malignant neoplasm of bronchus and lung: Secondary | ICD-10-CM | POA: Diagnosis not present

## 2021-05-11 NOTE — Patient Instructions (Signed)
Thank you for visiting Dr. Valeta Harms at [facility]. Today we recommend the following:  Orders Placed This Encounter  Procedures   Procedural/ Surgical Case Request: ROBOTIC ASSISTED NAVIGATIONAL BRONCHOSCOPY   Ambulatory referral to Pulmonology   Bronchoscopy on 06/09/2021  Return in about 5 weeks (around 06/16/2021) for with Eric Form, NP.    Please do your part to reduce the spread of COVID-19.

## 2021-05-11 NOTE — H&P (View-Only) (Signed)
Synopsis: Referred in December 2022 for lung nodule by [name], MD  Subjective:   PATIENT ID: Benjamin Case GENDER: male DOB: Jul 02, 1955, MRN: [id number]  Chief Complaint  Patient presents with   Consult    Patient is here to talk about nodule    This is a 65 year old gentleman, past medical history of diabetes, hepatitis C, hypertension, history of left pneumonectomy by Dr. 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  From respiratory standpoint he is doing okay.  Patient had a CT scan of the chest on 04/29/2021 by primary care.  This revealed a 1.1 posterior apical right upper lobe lung nodule concerning for primary bronchogenic carcinoma also there was a 6 cm apical right upper lobe nodule patient was referred here for evaluation of lung nodule and neck steps.  Patient already had a PET scan ordered by primary care which is scheduled for mid week.   Oncology History   No history exists.     Past Medical History:  Diagnosis Date   Diabetes mellitus without complication (HCC)    no medications , monitors cbg at home with monitor, avg no more than 150s at home    Dyspnea    increased exertion; pt states can climb flight of stairs w/o having to stop prior to reaching top ; ongoing reports it is due to "having one lung"    Hepatitis    hepatitis C; pt states competed treatments, unsure what medication he was treated with    History of chemotherapy    Hypertension    reports its been over a year since he stopped taking bp medication, reports he made her medical doctor aware that he was topping it    lung ca dx'd 12/2007   lt lobectomy   Neuropathy of both feet    Numbness    FEET BILATERAL   Rotator cuff tear    right      Family History  Problem Relation Age of Onset   Cancer Mother      Past Surgical History:  Procedure Laterality Date   ARTHROSCOPIC REPAIR ACL Right    CATARACT  EXTRACTION, BILATERAL  2019   DECOMPRESSIVE LUMBAR LAMINECTOMY LEVEL 1 N/A 03/24/2016   Procedure: L3-4 CENTRAL DECOMPRESSION LUMBER LAMINECTOMY;  Surgeon: Latanya Maudlin, MD;  Location: [facility];  Service: Orthopedics;  Laterality: N/A;   FOOT SURGERY Bilateral    HAND SURGERY Left    HAND SURGERY Right    LOBECTOMY Left 2009   SHOULDER ARTHROSCOPY WITH DISTAL CLAVICLE RESECTION Right 12/14/2018   Procedure: SHOULDER ARTHROSCOPY WITH DISTAL CLAVICLE RESECTION;  Surgeon: Tania Ade, MD;  Location: [facility];  Service: Orthopedics;  Laterality: Right;   SHOULDER ARTHROSCOPY WITH ROTATOR CUFF REPAIR Right 12/14/2018   Procedure: SHOULDER ARTHROSCOPY WITH ROTATOR CUFF REPAIR;  Surgeon: Tania Ade, MD;  Location: [facility];  Service: Orthopedics;  Laterality: Right;   SHOULDER ARTHROSCOPY WITH SUBACROMIAL DECOMPRESSION Right 12/14/2018   Procedure: SHOULDER ARTHROSCOPY WITH SUBACROMIAL DECOMPRESSION;  Surgeon: Tania Ade, MD;  Location: [facility];  Service: Orthopedics;  Laterality: Right;   TRIGGER FINGER RELEASE     right hand, pointer finger     Social History   Socioeconomic History   Marital status: Married    Spouse name: Vaughan Basta   Number of children: 2   Years of education: 11   Highest education level: Not on file  Occupational History  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, metal worker  Tobacco Use   Smoking status: Every Day    Packs/day: 0.50    Years: 45.00    Pack years: 22.50    Types: Cigarettes   Smokeless tobacco: Never   Tobacco comments:    08/04/16 3-6 cigs daily   Substance and Sexual Activity   Alcohol use: Yes    Comment: socially   Drug use: No   Sexual activity: Not on file  Other Topics Concern   Not on file  Social History Narrative   Lives with wife   Caffeine - coffee, 1 cup daily   Social Determinants of Health   Financial Resource Strain: Not on file  Food Insecurity: Not on file  Transportation Needs: Not on file  Physical Activity: Not on file   Stress: Not on file  Social Connections: Not on file  Intimate Partner Violence: Not on file     No Known Allergies   Outpatient Medications Prior to Visit  Medication Sig Dispense Refill   ALPRAZolam (XANAX) 1 MG tablet Take 1 mg by mouth 2 (two) times daily.  5   Ascorbic Acid (VITAMIN C) 1000 MG tablet Take 1,000 mg by mouth daily.     aspirin EC 81 MG tablet Take 81 mg by mouth daily.     atorvastatin (LIPITOR) 10 MG tablet Take 10 mg by mouth at bedtime.      B Complex-C (SUPER B COMPLEX PO) Take 1 tablet by mouth daily.     esomeprazole (NEXIUM) 40 MG capsule Take 40 mg by mouth daily as needed (for heartburn).      ipratropium-albuterol (DUONEB) 0.5-2.5 (3) MG/3ML SOLN Take 3 mLs by nebulization every 4 (four) hours as needed (for shortness of breath/wheezing.).      Omega-3 Fatty Acids ([employer] FISH OIL) 1000 MG CAPS Take by mouth.     oxyCODONE (ROXICODONE) 5 MG immediate release tablet Take 1 tablet (5 mg total) by mouth every 4 (four) hours as needed for severe pain. 20 tablet 0   tiZANidine (ZANAFLEX) 4 MG tablet Take 1 tablet (4 mg total) by mouth every 8 (eight) hours as needed for muscle spasms. 30 tablet 1   vitamin B-12 (CYANOCOBALAMIN) 1000 MCG tablet Take 1,000 mcg by mouth daily.     No facility-administered medications prior to visit.    Review of Systems  Constitutional:  Negative for chills, fever, malaise/fatigue and weight loss.  HENT:  Negative for hearing loss, sore throat and tinnitus.   Eyes:  Negative for blurred vision and double vision.  Respiratory:  Negative for cough, hemoptysis, sputum production, shortness of breath, wheezing and stridor.   Cardiovascular:  Negative for chest pain, palpitations, orthopnea, leg swelling and PND.  Gastrointestinal:  Negative for abdominal pain, constipation, diarrhea, heartburn, nausea and vomiting.  Genitourinary:  Negative for dysuria, hematuria and urgency.  Musculoskeletal:  Negative for joint pain and myalgias.   Skin:  Negative for itching and rash.  Neurological:  Negative for dizziness, tingling, weakness and headaches.  Case/Heme/Allergies:  Negative for environmental allergies. Does not bruise/bleed easily.  Psychiatric/Behavioral:  Negative for depression. The patient is not nervous/anxious and does not have insomnia.   All other systems reviewed and are negative.   Objective:  Physical Exam Vitals reviewed.  Constitutional:      General: He is not in acute distress.    Appearance: He is well-developed.  HENT:     Head: Normocephalic and atraumatic.  Eyes:     General:  No scleral icterus.    Conjunctiva/sclera: Conjunctivae normal.     Pupils: Pupils are equal, round, and reactive to light.  Neck:     Vascular: No JVD.     Trachea: No tracheal deviation.  Cardiovascular:     Rate and Rhythm: Normal rate and regular rhythm.     Heart sounds: Normal heart sounds. No murmur heard. Pulmonary:     Effort: Pulmonary effort is normal. No tachypnea, accessory muscle usage or respiratory distress.     Breath sounds: No stridor. No wheezing, rhonchi or rales.     Comments: Absent breath sounds on the left chest. Abdominal:     General: Bowel sounds are normal. There is no distension.     Palpations: Abdomen is soft.     Tenderness: There is no abdominal tenderness.  Musculoskeletal:        General: No tenderness.     Cervical back: Neck supple.  Lymphadenopathy:     Cervical: No cervical adenopathy.  Skin:    General: Skin is warm and dry.     Capillary Refill: Capillary refill takes less than 2 seconds.     Findings: No rash.  Neurological:     Mental Status: He is alert and oriented to person, place, and time.  Psychiatric:        Behavior: Behavior normal.     Vitals:   05/11/21 1457  BP: 128/80  Pulse: 96  Temp: 98.1 F (36.7 C)  TempSrc: Oral  SpO2: 96%  Weight: 192 lb 6.4 oz (87.3 kg)  Height: 5\' 10"  (1.778 m)   96% on RA BMI Readings from Last 3 Encounters:   05/11/21 27.61 kg/m  12/14/18 27.77 kg/m  12/11/18 27.76 kg/m   Wt Readings from Last 3 Encounters:  05/11/21 192 lb 6.4 oz (87.3 kg)  12/14/18 193 lb 9 oz (87.8 kg)  12/11/18 193 lb 8 oz (87.8 kg)     CBC    Component Value Date/Time   WBC 8.2 12/11/2018 1122   RBC 4.13 (L) 12/11/2018 1122   HGB 14.2 12/11/2018 1122   HGB 16.4 04/26/2016 1205   HCT 41.6 12/11/2018 1122   HCT 49.6 04/26/2016 1205   PLT 260 12/11/2018 1122   PLT 346 04/26/2016 1205   MCV 100.7 (H) 12/11/2018 1122   MCV 105.2 (H) 04/26/2016 1205   MCH 34.4 (H) 12/11/2018 1122   MCHC 34.1 12/11/2018 1122   RDW 11.6 12/11/2018 1122   RDW 13.1 04/26/2016 1205   LYMPHSABS 2.0 04/26/2016 1205   MONOABS 0.9 04/26/2016 1205   EOSABS 0.2 04/26/2016 1205   BASOSABS 0.1 04/26/2016 1205    Chest Imaging: November 2022 CT chest: 1.1 cm right upper lobe pulmonary nodule concerning for primary bronchogenic carcinoma. The patient's images have been independently reviewed by me.    Pulmonary Functions Testing Results: No flowsheet data found.  FeNO:   Pathology:   Echocardiogram:   Heart Catheterization:     Assessment & Plan:     ICD-10-CM   1. Lung nodules  R91.8 Ambulatory referral to Pulmonology    Procedural/ Surgical Case Request: ROBOTIC ASSISTED NAVIGATIONAL BRONCHOSCOPY    2. History of lung cancer  Z85.118     3. History of pneumonectomy  Z98.890    Z90.2       Discussion:  This is a 65 year old gentleman history of stage III lung cancer 2009 status post pneumonectomy of the left side.  Now with a right upper lobe nodule he has a  posterior lesion and a more apical lesion within a azygos lobe.  Plan: We discussed the risk benefits and alternatives of proceeding with bronchoscopy. I think he needs to consider robotic assisted navigational bronchoscopy for tissue sampling. The lesion in of itself is concerning enough on CT imaging for malignancy. He has a PET scan scheduled. Even if  it has low-level PET uptake with the patient's history of malignancy and the aggressive nature of the lesion macrolobulated, spiculated and associated pleural tach I suspect we are dealing with another stage I malignancy in the right upper lobe. We discussed this in detail today with patient's wife present. We will get his CT scan converted to super D imaging. Tentative bronchoscopy date will be on 06/09/2021 after the holidays. We appreciate the PCC's help with scheduling.   Current Outpatient Medications:    ALPRAZolam (XANAX) 1 MG tablet, Take 1 mg by mouth 2 (two) times daily., Disp: , Rfl: 5   Ascorbic Acid (VITAMIN C) 1000 MG tablet, Take 1,000 mg by mouth daily., Disp: , Rfl:    aspirin EC 81 MG tablet, Take 81 mg by mouth daily., Disp: , Rfl:    atorvastatin (LIPITOR) 10 MG tablet, Take 10 mg by mouth at bedtime. , Disp: , Rfl:    B Complex-C (SUPER B COMPLEX PO), Take 1 tablet by mouth daily., Disp: , Rfl:    esomeprazole (NEXIUM) 40 MG capsule, Take 40 mg by mouth daily as needed (for heartburn). , Disp: , Rfl:    ipratropium-albuterol (DUONEB) 0.5-2.5 (3) MG/3ML SOLN, Take 3 mLs by nebulization every 4 (four) hours as needed (for shortness of breath/wheezing.). , Disp: , Rfl:    Omega-3 Fatty Acids ([employer] FISH OIL) 1000 MG CAPS, Take by mouth., Disp: , Rfl:    oxyCODONE (ROXICODONE) 5 MG immediate release tablet, Take 1 tablet (5 mg total) by mouth every 4 (four) hours as needed for severe pain., Disp: 20 tablet, Rfl: 0   tiZANidine (ZANAFLEX) 4 MG tablet, Take 1 tablet (4 mg total) by mouth every 8 (eight) hours as needed for muscle spasms., Disp: 30 tablet, Rfl: 1   vitamin B-12 (CYANOCOBALAMIN) 1000 MCG tablet, Take 1,000 mcg by mouth daily., Disp: , Rfl:   I spent 62 minutes dedicated to the care of this patient on the date of this encounter to include pre-visit review of records, face-to-face time with the patient discussing conditions above, post visit ordering of testing, clinical  documentation with the electronic health record, making appropriate referrals as documented, and communicating necessary findings to members of the patients care team.   Garner Nash, DO [facility] 05/11/2021 3:02 PM

## 2021-05-11 NOTE — Telephone Encounter (Signed)
I scheduled for 1/3 at 1:15 at [facility].  Pt to go for covid test on 12/30.  Gave appt info to pt.  I spoke to [place] at GI and she is going to burn disk and have sent to me so I can send to [facility].

## 2021-05-11 NOTE — Progress Notes (Signed)
Synopsis: Referred in December 2022 for lung nodule by [name], MD  Subjective:   PATIENT ID: Benjamin Case GENDER: male DOB: 10-13-1955, MRN: [id number]  Chief Complaint  Patient presents with   Consult    Patient is here to talk about nodule    This is a 65 year old gentleman, past medical history of diabetes, hepatitis C, hypertension, history of left pneumonectomy by Dr. 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  From respiratory standpoint he is doing okay.  Patient had a CT scan of the chest on 04/29/2021 by primary care.  This revealed a 1.1 posterior apical right upper lobe lung nodule concerning for primary bronchogenic carcinoma also there was a 6 cm apical right upper lobe nodule patient was referred here for evaluation of lung nodule and neck steps.  Patient already had a PET scan ordered by primary care which is scheduled for mid week.   Oncology History   No history exists.     Past Medical History:  Diagnosis Date   Diabetes mellitus without complication (HCC)    no medications , monitors cbg at home with monitor, avg no more than 150s at home    Dyspnea    increased exertion; pt states can climb flight of stairs w/o having to stop prior to reaching top ; ongoing reports it is due to "having one lung"    Hepatitis    hepatitis C; pt states competed treatments, unsure what medication he was treated with    History of chemotherapy    Hypertension    reports its been over a year since he stopped taking bp medication, reports he made her medical doctor aware that he was topping it    lung ca dx'd 12/2007   lt lobectomy   Neuropathy of both feet    Numbness    FEET BILATERAL   Rotator cuff tear    right      Family History  Problem Relation Age of Onset   Cancer Mother      Past Surgical History:  Procedure Laterality Date   ARTHROSCOPIC REPAIR ACL Right    CATARACT  EXTRACTION, BILATERAL  2019   DECOMPRESSIVE LUMBAR LAMINECTOMY LEVEL 1 N/A 03/24/2016   Procedure: L3-4 CENTRAL DECOMPRESSION LUMBER LAMINECTOMY;  Surgeon: Latanya Maudlin, MD;  Location: [facility];  Service: Orthopedics;  Laterality: N/A;   FOOT SURGERY Bilateral    HAND SURGERY Left    HAND SURGERY Right    LOBECTOMY Left 2009   SHOULDER ARTHROSCOPY WITH DISTAL CLAVICLE RESECTION Right 12/14/2018   Procedure: SHOULDER ARTHROSCOPY WITH DISTAL CLAVICLE RESECTION;  Surgeon: Tania Ade, MD;  Location: [facility];  Service: Orthopedics;  Laterality: Right;   SHOULDER ARTHROSCOPY WITH ROTATOR CUFF REPAIR Right 12/14/2018   Procedure: SHOULDER ARTHROSCOPY WITH ROTATOR CUFF REPAIR;  Surgeon: Tania Ade, MD;  Location: [facility];  Service: Orthopedics;  Laterality: Right;   SHOULDER ARTHROSCOPY WITH SUBACROMIAL DECOMPRESSION Right 12/14/2018   Procedure: SHOULDER ARTHROSCOPY WITH SUBACROMIAL DECOMPRESSION;  Surgeon: Tania Ade, MD;  Location: [facility];  Service: Orthopedics;  Laterality: Right;   TRIGGER FINGER RELEASE     right hand, pointer finger     Social History   Socioeconomic History   Marital status: Married    Spouse name: Vaughan Basta   Number of children: 2   Years of education: 11   Highest education level: Not on file  Occupational History  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, metal worker  Tobacco Use   Smoking status: Every Day    Packs/day: 0.50    Years: 45.00    Pack years: 22.50    Types: Cigarettes   Smokeless tobacco: Never   Tobacco comments:    08/04/16 3-6 cigs daily   Substance and Sexual Activity   Alcohol use: Yes    Comment: socially   Drug use: No   Sexual activity: Not on file  Other Topics Concern   Not on file  Social History Narrative   Lives with wife   Caffeine - coffee, 1 cup daily   Social Determinants of Health   Financial Resource Strain: Not on file  Food Insecurity: Not on file  Transportation Needs: Not on file  Physical Activity: Not on file   Stress: Not on file  Social Connections: Not on file  Intimate Partner Violence: Not on file     No Known Allergies   Outpatient Medications Prior to Visit  Medication Sig Dispense Refill   ALPRAZolam (XANAX) 1 MG tablet Take 1 mg by mouth 2 (two) times daily.  5   Ascorbic Acid (VITAMIN C) 1000 MG tablet Take 1,000 mg by mouth daily.     aspirin EC 81 MG tablet Take 81 mg by mouth daily.     atorvastatin (LIPITOR) 10 MG tablet Take 10 mg by mouth at bedtime.      B Complex-C (SUPER B COMPLEX PO) Take 1 tablet by mouth daily.     esomeprazole (NEXIUM) 40 MG capsule Take 40 mg by mouth daily as needed (for heartburn).      ipratropium-albuterol (DUONEB) 0.5-2.5 (3) MG/3ML SOLN Take 3 mLs by nebulization every 4 (four) hours as needed (for shortness of breath/wheezing.).      Omega-3 Fatty Acids ([employer] FISH OIL) 1000 MG CAPS Take by mouth.     oxyCODONE (ROXICODONE) 5 MG immediate release tablet Take 1 tablet (5 mg total) by mouth every 4 (four) hours as needed for severe pain. 20 tablet 0   tiZANidine (ZANAFLEX) 4 MG tablet Take 1 tablet (4 mg total) by mouth every 8 (eight) hours as needed for muscle spasms. 30 tablet 1   vitamin B-12 (CYANOCOBALAMIN) 1000 MCG tablet Take 1,000 mcg by mouth daily.     No facility-administered medications prior to visit.    Review of Systems  Constitutional:  Negative for chills, fever, malaise/fatigue and weight loss.  HENT:  Negative for hearing loss, sore throat and tinnitus.   Eyes:  Negative for blurred vision and double vision.  Respiratory:  Negative for cough, hemoptysis, sputum production, shortness of breath, wheezing and stridor.   Cardiovascular:  Negative for chest pain, palpitations, orthopnea, leg swelling and PND.  Gastrointestinal:  Negative for abdominal pain, constipation, diarrhea, heartburn, nausea and vomiting.  Genitourinary:  Negative for dysuria, hematuria and urgency.  Musculoskeletal:  Negative for joint pain and myalgias.   Skin:  Negative for itching and rash.  Neurological:  Negative for dizziness, tingling, weakness and headaches.  Case/Heme/Allergies:  Negative for environmental allergies. Does not bruise/bleed easily.  Psychiatric/Behavioral:  Negative for depression. The patient is not nervous/anxious and does not have insomnia.   All other systems reviewed and are negative.   Objective:  Physical Exam Vitals reviewed.  Constitutional:      General: He is not in acute distress.    Appearance: He is well-developed.  HENT:     Head: Normocephalic and atraumatic.  Eyes:     General:  No scleral icterus.    Conjunctiva/sclera: Conjunctivae normal.     Pupils: Pupils are equal, round, and reactive to light.  Neck:     Vascular: No JVD.     Trachea: No tracheal deviation.  Cardiovascular:     Rate and Rhythm: Normal rate and regular rhythm.     Heart sounds: Normal heart sounds. No murmur heard. Pulmonary:     Effort: Pulmonary effort is normal. No tachypnea, accessory muscle usage or respiratory distress.     Breath sounds: No stridor. No wheezing, rhonchi or rales.     Comments: Absent breath sounds on the left chest. Abdominal:     General: Bowel sounds are normal. There is no distension.     Palpations: Abdomen is soft.     Tenderness: There is no abdominal tenderness.  Musculoskeletal:        General: No tenderness.     Cervical back: Neck supple.  Lymphadenopathy:     Cervical: No cervical adenopathy.  Skin:    General: Skin is warm and dry.     Capillary Refill: Capillary refill takes less than 2 seconds.     Findings: No rash.  Neurological:     Mental Status: He is alert and oriented to person, place, and time.  Psychiatric:        Behavior: Behavior normal.     Vitals:   05/11/21 1457  BP: 128/80  Pulse: 96  Temp: 98.1 F (36.7 C)  TempSrc: Oral  SpO2: 96%  Weight: 192 lb 6.4 oz (87.3 kg)  Height: 5\' 10"  (1.778 m)   96% on RA BMI Readings from Last 3 Encounters:   05/11/21 27.61 kg/m  12/14/18 27.77 kg/m  12/11/18 27.76 kg/m   Wt Readings from Last 3 Encounters:  05/11/21 192 lb 6.4 oz (87.3 kg)  12/14/18 193 lb 9 oz (87.8 kg)  12/11/18 193 lb 8 oz (87.8 kg)     CBC    Component Value Date/Time   WBC 8.2 12/11/2018 1122   RBC 4.13 (L) 12/11/2018 1122   HGB 14.2 12/11/2018 1122   HGB 16.4 04/26/2016 1205   HCT 41.6 12/11/2018 1122   HCT 49.6 04/26/2016 1205   PLT 260 12/11/2018 1122   PLT 346 04/26/2016 1205   MCV 100.7 (H) 12/11/2018 1122   MCV 105.2 (H) 04/26/2016 1205   MCH 34.4 (H) 12/11/2018 1122   MCHC 34.1 12/11/2018 1122   RDW 11.6 12/11/2018 1122   RDW 13.1 04/26/2016 1205   LYMPHSABS 2.0 04/26/2016 1205   MONOABS 0.9 04/26/2016 1205   EOSABS 0.2 04/26/2016 1205   BASOSABS 0.1 04/26/2016 1205    Chest Imaging: November 2022 CT chest: 1.1 cm right upper lobe pulmonary nodule concerning for primary bronchogenic carcinoma. The patient's images have been independently reviewed by me.    Pulmonary Functions Testing Results: No flowsheet data found.  FeNO:   Pathology:   Echocardiogram:   Heart Catheterization:     Assessment & Plan:     ICD-10-CM   1. Lung nodules  R91.8 Ambulatory referral to Pulmonology    Procedural/ Surgical Case Request: ROBOTIC ASSISTED NAVIGATIONAL BRONCHOSCOPY    2. History of lung cancer  Z85.118     3. History of pneumonectomy  Z98.890    Z90.2       Discussion:  This is a 65 year old gentleman history of stage III lung cancer 2009 status post pneumonectomy of the left side.  Now with a right upper lobe nodule he has a  posterior lesion and a more apical lesion within a azygos lobe.  Plan: We discussed the risk benefits and alternatives of proceeding with bronchoscopy. I think he needs to consider robotic assisted navigational bronchoscopy for tissue sampling. The lesion in of itself is concerning enough on CT imaging for malignancy. He has a PET scan scheduled. Even if  it has low-level PET uptake with the patient's history of malignancy and the aggressive nature of the lesion macrolobulated, spiculated and associated pleural tach I suspect we are dealing with another stage I malignancy in the right upper lobe. We discussed this in detail today with patient's wife present. We will get his CT scan converted to super D imaging. Tentative bronchoscopy date will be on 06/09/2021 after the holidays. We appreciate the PCC's help with scheduling.   Current Outpatient Medications:    ALPRAZolam (XANAX) 1 MG tablet, Take 1 mg by mouth 2 (two) times daily., Disp: , Rfl: 5   Ascorbic Acid (VITAMIN C) 1000 MG tablet, Take 1,000 mg by mouth daily., Disp: , Rfl:    aspirin EC 81 MG tablet, Take 81 mg by mouth daily., Disp: , Rfl:    atorvastatin (LIPITOR) 10 MG tablet, Take 10 mg by mouth at bedtime. , Disp: , Rfl:    B Complex-C (SUPER B COMPLEX PO), Take 1 tablet by mouth daily., Disp: , Rfl:    esomeprazole (NEXIUM) 40 MG capsule, Take 40 mg by mouth daily as needed (for heartburn). , Disp: , Rfl:    ipratropium-albuterol (DUONEB) 0.5-2.5 (3) MG/3ML SOLN, Take 3 mLs by nebulization every 4 (four) hours as needed (for shortness of breath/wheezing.). , Disp: , Rfl:    Omega-3 Fatty Acids ([employer] FISH OIL) 1000 MG CAPS, Take by mouth., Disp: , Rfl:    oxyCODONE (ROXICODONE) 5 MG immediate release tablet, Take 1 tablet (5 mg total) by mouth every 4 (four) hours as needed for severe pain., Disp: 20 tablet, Rfl: 0   tiZANidine (ZANAFLEX) 4 MG tablet, Take 1 tablet (4 mg total) by mouth every 8 (eight) hours as needed for muscle spasms., Disp: 30 tablet, Rfl: 1   vitamin B-12 (CYANOCOBALAMIN) 1000 MCG tablet, Take 1,000 mcg by mouth daily., Disp: , Rfl:   I spent 62 minutes dedicated to the care of this patient on the date of this encounter to include pre-visit review of records, face-to-face time with the patient discussing conditions above, post visit ordering of testing, clinical  documentation with the electronic health record, making appropriate referrals as documented, and communicating necessary findings to members of the patients care team.   Garner Nash, DO [facility] 05/11/2021 3:02 PM

## 2021-05-13 ENCOUNTER — Encounter (HOSPITAL_COMMUNITY)
Admission: RE | Admit: 2021-05-13 | Discharge: 2021-05-13 | Disposition: A | Discharge status: Home / Self Care | Payer: Medicare Other | Source: Ambulatory Visit | Attending: Family Medicine | Admitting: Family Medicine

## 2021-05-13 ENCOUNTER — Other Ambulatory Visit: Payer: Self-pay

## 2021-05-13 DIAGNOSIS — F17200 Nicotine dependence, unspecified, uncomplicated: Secondary | ICD-10-CM | POA: Diagnosis present

## 2021-05-13 DIAGNOSIS — R918 Other nonspecific abnormal finding of lung field: Secondary | ICD-10-CM | POA: Diagnosis not present

## 2021-05-13 DIAGNOSIS — Z85118 Personal history of other malignant neoplasm of bronchus and lung: Secondary | ICD-10-CM | POA: Diagnosis present

## 2021-05-13 LAB — GLUCOSE, CAPILLARY: Glucose-Capillary: 149 mg/dL — ABNORMAL HIGH (ref 70–99)

## 2021-05-13 MED ORDER — FLUDEOXYGLUCOSE F - 18 (FDG) INJECTION
10.0000 | Freq: Once | INTRAVENOUS | Status: AC | PRN
  Administered 2021-05-13: 9.5 via INTRAVENOUS

## 2021-05-13 NOTE — Telephone Encounter (Signed)
Disc received and I sent to [facility] at [place]

## 2021-06-04 ENCOUNTER — Encounter (HOSPITAL_COMMUNITY): Payer: Self-pay | Admitting: Pulmonary Disease

## 2021-06-04 ENCOUNTER — Other Ambulatory Visit: Payer: Self-pay

## 2021-06-04 NOTE — Progress Notes (Signed)
Spoke with pt's wife, Vaughan Basta for pre-op call. Pt gave his wife the phone when I called him. DPR is on file. Pt has hx of HTN, but has not been on medications for several years. Pt is diabetic, but is not on medications. Last A1C was 5.8 on 03/26/21. She states that his fasting blood sugar is usually around 120.   Pt will go for Covid testing Friday, 06/05/21. Instructed Linda to have pt where a mask after getting the test done when he is around people who don't normally live with him. She voiced understanding.

## 2021-06-05 ENCOUNTER — Other Ambulatory Visit: Payer: Self-pay | Admitting: Pulmonary Disease

## 2021-06-05 LAB — SARS CORONAVIRUS 2 (TAT 6-24 HRS): SARS Coronavirus 2: NEGATIVE

## 2021-06-09 ENCOUNTER — Ambulatory Visit (HOSPITAL_COMMUNITY): Payer: Medicare Other

## 2021-06-09 ENCOUNTER — Encounter (HOSPITAL_COMMUNITY)
Admission: RE | Disposition: A | Discharge status: Home / Self Care | Payer: Self-pay | Source: Home / Self Care | Attending: Pulmonary Disease

## 2021-06-09 ENCOUNTER — Other Ambulatory Visit: Payer: Self-pay

## 2021-06-09 ENCOUNTER — Ambulatory Visit (HOSPITAL_COMMUNITY): Payer: Medicare Other | Admitting: Certified Registered Nurse Anesthetist

## 2021-06-09 ENCOUNTER — Encounter (HOSPITAL_COMMUNITY): Payer: Self-pay | Admitting: Pulmonary Disease

## 2021-06-09 ENCOUNTER — Ambulatory Visit (HOSPITAL_COMMUNITY)
Admission: RE | Admit: 2021-06-09 | Discharge: 2021-06-09 | Disposition: A | Discharge status: Home / Self Care | Payer: Medicare Other | Attending: Pulmonary Disease | Admitting: Pulmonary Disease

## 2021-06-09 DIAGNOSIS — B1920 Unspecified viral hepatitis C without hepatic coma: Secondary | ICD-10-CM | POA: Insufficient documentation

## 2021-06-09 DIAGNOSIS — E119 Type 2 diabetes mellitus without complications: Secondary | ICD-10-CM | POA: Diagnosis not present

## 2021-06-09 DIAGNOSIS — J449 Chronic obstructive pulmonary disease, unspecified: Secondary | ICD-10-CM | POA: Insufficient documentation

## 2021-06-09 DIAGNOSIS — R918 Other nonspecific abnormal finding of lung field: Secondary | ICD-10-CM | POA: Diagnosis not present

## 2021-06-09 DIAGNOSIS — Z902 Acquired absence of lung [part of]: Secondary | ICD-10-CM | POA: Insufficient documentation

## 2021-06-09 DIAGNOSIS — M1991 Primary osteoarthritis, unspecified site: Secondary | ICD-10-CM | POA: Diagnosis not present

## 2021-06-09 DIAGNOSIS — I10 Essential (primary) hypertension: Secondary | ICD-10-CM | POA: Diagnosis not present

## 2021-06-09 DIAGNOSIS — Z85118 Personal history of other malignant neoplasm of bronchus and lung: Secondary | ICD-10-CM | POA: Insufficient documentation

## 2021-06-09 DIAGNOSIS — K219 Gastro-esophageal reflux disease without esophagitis: Secondary | ICD-10-CM | POA: Diagnosis not present

## 2021-06-09 DIAGNOSIS — E1140 Type 2 diabetes mellitus with diabetic neuropathy, unspecified: Secondary | ICD-10-CM | POA: Insufficient documentation

## 2021-06-09 DIAGNOSIS — Z9889 Other specified postprocedural states: Secondary | ICD-10-CM

## 2021-06-09 DIAGNOSIS — Z9221 Personal history of antineoplastic chemotherapy: Secondary | ICD-10-CM | POA: Diagnosis not present

## 2021-06-09 DIAGNOSIS — R911 Solitary pulmonary nodule: Secondary | ICD-10-CM | POA: Insufficient documentation

## 2021-06-09 DIAGNOSIS — F17210 Nicotine dependence, cigarettes, uncomplicated: Secondary | ICD-10-CM | POA: Diagnosis not present

## 2021-06-09 DIAGNOSIS — Z419 Encounter for procedure for purposes other than remedying health state, unspecified: Secondary | ICD-10-CM

## 2021-06-09 HISTORY — PX: BRONCHIAL WASHINGS: SHX5105

## 2021-06-09 HISTORY — PX: BRONCHIAL BIOPSY: SHX5109

## 2021-06-09 HISTORY — PX: BRONCHIAL BRUSHINGS: SHX5108

## 2021-06-09 HISTORY — PX: VIDEO BRONCHOSCOPY WITH RADIAL ENDOBRONCHIAL ULTRASOUND: SHX6849

## 2021-06-09 HISTORY — DX: Chronic obstructive pulmonary disease, unspecified: J44.9

## 2021-06-09 HISTORY — DX: Unspecified osteoarthritis, unspecified site: M19.90

## 2021-06-09 HISTORY — DX: Gastro-esophageal reflux disease without esophagitis: K21.9

## 2021-06-09 LAB — CBC
HCT: 44.2 % (ref 39.0–52.0)
Hemoglobin: 15.5 g/dL (ref 13.0–17.0)
MCH: 35.1 pg — ABNORMAL HIGH (ref 26.0–34.0)
MCHC: 35.1 g/dL (ref 30.0–36.0)
MCV: 100.2 fL — ABNORMAL HIGH (ref 80.0–100.0)
Platelets: 259 10*3/uL (ref 150–400)
RBC: 4.41 MIL/uL (ref 4.22–5.81)
RDW: 11.6 % (ref 11.5–15.5)
WBC: 7.8 10*3/uL (ref 4.0–10.5)
nRBC: 0 % (ref 0.0–0.2)

## 2021-06-09 LAB — BASIC METABOLIC PANEL
Anion gap: 11 (ref 5–15)
BUN: 12 mg/dL (ref 8–23)
CO2: 27 mmol/L (ref 22–32)
Calcium: 9 mg/dL (ref 8.9–10.3)
Chloride: 97 mmol/L — ABNORMAL LOW (ref 98–111)
Creatinine, Ser: 1.1 mg/dL (ref 0.61–1.24)
GFR, Estimated: >60 mL/min (ref >60)
Glucose, Bld: 102 mg/dL — ABNORMAL HIGH (ref 70–99)
Potassium: 3.6 mmol/L (ref 3.5–5.1)
Sodium: 135 mmol/L (ref 135–145)

## 2021-06-09 LAB — GLUCOSE, CAPILLARY
Glucose-Capillary: 121 mg/dL — ABNORMAL HIGH (ref 70–99)
Glucose-Capillary: 135 mg/dL — ABNORMAL HIGH (ref 70–99)

## 2021-06-09 SURGERY — BRONCHOSCOPY, WITH BIOPSY USING ELECTROMAGNETIC NAVIGATION
Anesthesia: General | Laterality: Right

## 2021-06-09 MED ORDER — FENTANYL CITRATE (PF) 100 MCG/2ML IJ SOLN
INTRAMUSCULAR | Status: DC | PRN
  Administered 2021-06-09: 50 ug via INTRAVENOUS

## 2021-06-09 MED ORDER — LACTATED RINGERS IV SOLN: INTRAVENOUS | Status: DC

## 2021-06-09 MED ORDER — DEXAMETHASONE SODIUM PHOSPHATE 10 MG/ML IJ SOLN
INTRAMUSCULAR | Status: DC | PRN
  Administered 2021-06-09: 5 mg via INTRAVENOUS

## 2021-06-09 MED ORDER — SUCCINYLCHOLINE CHLORIDE 200 MG/10ML IV SOSY
PREFILLED_SYRINGE | INTRAVENOUS | Status: DC | PRN
  Administered 2021-06-09: 120 mg via INTRAVENOUS

## 2021-06-09 MED ORDER — ONDANSETRON HCL 4 MG/2ML IJ SOLN
INTRAMUSCULAR | Status: DC | PRN
  Administered 2021-06-09: 4 mg via INTRAVENOUS

## 2021-06-09 MED ORDER — CHLORHEXIDINE GLUCONATE 0.12 % MT SOLN
15.0000 mL | Freq: Once | OROMUCOSAL | Status: AC
Start: 2021-06-09 — End: 2021-06-09

## 2021-06-09 MED ORDER — ROCURONIUM BROMIDE 10 MG/ML (PF) SYRINGE
PREFILLED_SYRINGE | INTRAVENOUS | Status: DC | PRN
  Administered 2021-06-09: 40 mg via INTRAVENOUS
  Administered 2021-06-09 (×2): 20 mg via INTRAVENOUS

## 2021-06-09 MED ORDER — PHENYLEPHRINE HCL-NACL 20-0.9 MG/250ML-% IV SOLN
INTRAVENOUS | Status: DC | PRN
  Administered 2021-06-09: 20 ug/min via INTRAVENOUS

## 2021-06-09 MED ORDER — LIDOCAINE 2% (20 MG/ML) 5 ML SYRINGE
INTRAMUSCULAR | Status: DC | PRN
  Administered 2021-06-09: 40 mg via INTRAVENOUS

## 2021-06-09 MED ORDER — SUGAMMADEX SODIUM 200 MG/2ML IV SOLN
INTRAVENOUS | Status: DC | PRN
  Administered 2021-06-09: 200 mg via INTRAVENOUS

## 2021-06-09 MED ORDER — CHLORHEXIDINE GLUCONATE 0.12 % MT SOLN
OROMUCOSAL | Status: AC
  Administered 2021-06-09: 15 mL via OROMUCOSAL
  Filled 2021-06-09: qty 15

## 2021-06-09 MED ORDER — PROPOFOL 10 MG/ML IV BOLUS
INTRAVENOUS | Status: DC | PRN
Start: 2021-06-09 — End: 2021-06-09
  Administered 2021-06-09: 140 mg via INTRAVENOUS

## 2021-06-09 NOTE — Anesthesia Procedure Notes (Signed)
Procedure Name: Intubation Date/Time: 06/09/2021 7:40 AM Performed by: Reece Agar, CRNA Pre-anesthesia Checklist: Patient identified, Emergency Drugs available, Suction available and Patient being monitored Patient Re-evaluated:Patient Re-evaluated prior to induction Oxygen Delivery Method: Circle System Utilized Preoxygenation: Pre-oxygenation with 100% oxygen Induction Type: IV induction Ventilation: Mask ventilation without difficulty Laryngoscope Size: Mac and 4 Grade View: Grade II Tube type: Oral Number of attempts: 1 Airway Equipment and Method: Stylet Placement Confirmation: ETT inserted through vocal cords under direct vision, positive ETCO2 and breath sounds checked- equal and bilateral Secured at: 23 cm Tube secured with: Tape Dental Injury: Teeth and Oropharynx as per pre-operative assessment

## 2021-06-09 NOTE — Transfer of Care (Signed)
Immediate Anesthesia Transfer of Care Note  Patient: Benjamin Case  Procedure(s) Performed: ROBOTIC ASSISTED NAVIGATIONAL BRONCHOSCOPY (Right) BRONCHIAL BIOPSIES BRONCHIAL BRUSHINGS VIDEO BRONCHOSCOPY WITH RADIAL ENDOBRONCHIAL ULTRASOUND BRONCHIAL WASHINGS  Patient Location: PACU  Anesthesia Type:General  Level of Consciousness: awake and alert   Airway & Oxygen Therapy: Patient Spontanous Breathing and Patient connected to face mask oxygen  Post-op Assessment: Report given to RN and Post -op Vital signs reviewed and stable  Post vital signs: Reviewed and stable  Last Vitals:  Vitals Value Taken Time  BP 155/83 06/09/21 0906  Temp    Pulse 74 06/09/21 0909  Resp 18 06/09/21 0909  SpO2 100 % 06/09/21 0909  Vitals shown include unvalidated device data.  Last Pain:  Vitals:   06/09/21 0625  TempSrc:   PainSc: 0-No pain         Complications: No notable events documented.

## 2021-06-09 NOTE — Op Note (Signed)
Video Bronchoscopy with Robotic Assisted Bronchoscopic Navigation   Date of Operation: 06/09/2021   Pre-op Diagnosis: Lung nodule  Post-op Diagnosis: Lung nodule  Surgeon: Garner Nash, DO   Assistants: None   Anesthesia: General endotracheal anesthesia  Operation: Flexible video fiberoptic bronchoscopy with robotic assistance and biopsies.  Estimated Blood Loss: Minimal  Complications: None  Indications and History: Benjamin Case is a 66 y.o. male with history of lung cancer on the left status post left pneumonectomy, lung nodule in the right upper lobe. The risks, benefits, complications, treatment options and expected outcomes were discussed with the patient.  The possibilities of pneumothorax, pneumonia, reaction to medication, pulmonary aspiration, perforation of a viscus, bleeding, failure to diagnose a condition and creating a complication requiring transfusion or operation were discussed with the patient who freely signed the consent.    Description of Procedure: The patient was seen in the Preoperative Area, was examined and was deemed appropriate to proceed.  The patient was taken to [facility] endoscopy room 3, identified as Benjamin Case and the procedure verified as Flexible Video Fiberoptic Bronchoscopy.  A Time Out was held and the above information confirmed.   Prior to the date of the procedure a high-resolution CT scan of the chest was performed. Utilizing ION software program a virtual tracheobronchial tree was generated to allow the creation of distinct navigation pathways to the patient's parenchymal abnormalities. After being taken to the operating room general anesthesia was initiated and the patient  was orally intubated. The video fiberoptic bronchoscope was introduced via the endotracheal tube and a general inspection was performed which showed normal right and left lung anatomy, aspiration of the bilateral mainstems was completed to remove any remaining secretions.  Robotic catheter inserted into patient's endotracheal tube.   Target #1 RUL nodule: The distinct navigation pathways prepared prior to this procedure were then utilized to navigate to patient's lesion identified on CT scan. The robotic catheter was secured into place and the vision probe was withdrawn.  Lesion location was approximated using fluoroscopy, radial endobronchial ultrasound for peripheral targeting. Under fluoroscopic guidance transbronchial needle brushings, and transbronchial forceps biopsies were performed to be sent for cytology and pathology. A bronchioalveolar lavage was performed in the RUL and sent for cytology.  At the end of the procedure a general airway inspection was performed and there was no evidence of active bleeding. The bronchoscope was removed.  The patient tolerated the procedure well. There was no significant blood loss and there were no obvious complications. A post-procedural chest x-ray is pending.  Samples Target #1: 1. Transbronchial needle brushings from RUL  2. Transbronchial forceps biopsies from RUL 3. Bronchoalveolar lavage from RUL  Plans:  The patient will be discharged from the PACU to home when recovered from anesthesia and after chest x-ray is reviewed. We will review the cytology, pathology and microbiology results with the patient when they become available. Outpatient followup will be with Garner Nash, DO.    Garner Nash, DO [facility] 06/09/2021 9:10 AM

## 2021-06-09 NOTE — Discharge Instructions (Signed)
Flexible Bronchoscopy, Care After This sheet gives you information about how to care for yourself after your test. Your doctor may also give you more specific instructions. If you have problems or questions, contact your doctor. Follow these instructions at home: Eating and drinking Do not eat or drink anything (not even water) for 2 hours after your test, or until your numbing medicine (local anesthetic) wears off. When your numbness is gone and your cough and gag reflexes have come back, you may: Eat only soft foods. Slowly drink liquids. The day after the test, go back to your normal diet. Driving Do not drive for 24 hours if you were given a medicine to help you relax (sedative). Do not drive or use heavy machinery while taking prescription pain medicine. General instructions  Take over-the-counter and prescription medicines only as told by your doctor. Return to your normal activities as told. Ask what activities are safe for you. Do not use any products that have nicotine or tobacco in them. This includes cigarettes and e-cigarettes. If you need help quitting, ask your doctor. Keep all follow-up visits as told by your doctor. This is important. It is very important if you had a tissue sample (biopsy) taken. Get help right away if: You have shortness of breath that gets worse. You get light-headed. You feel like you are going to pass out (faint). You have chest pain. You cough up: More than a little blood. More blood than before. Summary Do not eat or drink anything (not even water) for 2 hours after your test, or until your numbing medicine wears off. Do not use cigarettes. Do not use e-cigarettes. Get help right away if you have chest pain.  This information is not intended to replace advice given to you by your health care provider. Make sure you discuss any questions you have with your health care provider. Document Released: 03/21/2009 Document Revised: 05/06/2017 Document  Reviewed: 06/11/2016 Elsevier Patient Education  2020 [employer].

## 2021-06-09 NOTE — Interval H&P Note (Signed)
History and Physical Interval Note:  06/09/2021 7:01 AM  Benjamin Case  has presented today for surgery, with the diagnosis of Lung nodule.  The various methods of treatment have been discussed with the patient and family. After consideration of risks, benefits and other options for treatment, the patient has consented to  Procedure(s) with comments: ROBOTIC ASSISTED NAVIGATIONAL BRONCHOSCOPY (Right) - ION w/ CIOS as a surgical intervention.  The patient's history has been reviewed, patient examined, no change in status, stable for surgery.  I have reviewed the patient's chart and labs.  Questions were answered to the patient's satisfaction.     Kitzmiller

## 2021-06-09 NOTE — Anesthesia Postprocedure Evaluation (Signed)
Anesthesia Post Note  Patient: Benjamin Case  Procedure(s) Performed: ROBOTIC ASSISTED NAVIGATIONAL BRONCHOSCOPY (Right) BRONCHIAL BIOPSIES BRONCHIAL BRUSHINGS VIDEO BRONCHOSCOPY WITH RADIAL ENDOBRONCHIAL ULTRASOUND BRONCHIAL WASHINGS     Patient location during evaluation: PACU Anesthesia Type: General Level of consciousness: awake and alert Pain management: pain level controlled Vital Signs Assessment: post-procedure vital signs reviewed and stable Respiratory status: spontaneous breathing, nonlabored ventilation, respiratory function stable and patient connected to nasal cannula oxygen Cardiovascular status: blood pressure returned to baseline and stable Postop Assessment: no apparent nausea or vomiting Anesthetic complications: no   No notable events documented.  Last Vitals:  Vitals:   06/09/21 0936 06/09/21 0943  BP: (!) 147/82   Pulse: 73 76  Resp: 16 13  Temp:  36.6 C  SpO2: 95% 96%    Last Pain:  Vitals:   06/09/21 0625  TempSrc:   PainSc: 0-No pain                 Effie Berkshire

## 2021-06-09 NOTE — Anesthesia Preprocedure Evaluation (Addendum)
Anesthesia Evaluation  Patient identified by MRN, date of birth, ID band Patient awake    Reviewed: Allergy & Precautions, NPO status , Patient's Chart, lab work & pertinent test results  Airway Mallampati: III  TM Distance: <3 FB Neck ROM: Full    Dental  (+) Teeth Intact, Dental Advisory Given   Pulmonary COPD,  COPD inhaler, Current Smoker,     + decreased breath sounds      Cardiovascular hypertension,  Rhythm:Regular Rate:Normal     Neuro/Psych  Neuromuscular disease negative psych ROS   GI/Hepatic GERD  Medicated,(+) Hepatitis -  Endo/Other  diabetes  Renal/GU negative Renal ROS     Musculoskeletal  (+) Arthritis ,   Abdominal Normal abdominal exam  (+)   Peds  Hematology negative hematology ROS (+)   Anesthesia Other Findings   Reproductive/Obstetrics                            Anesthesia Physical Anesthesia Plan  ASA: 3  Anesthesia Plan: General   Post-op Pain Management:    Induction: Intravenous  PONV Risk Score and Plan: 1 and Ondansetron and Midazolam  Airway Management Planned: Oral ETT  Additional Equipment: None  Intra-op Plan:   Post-operative Plan: Extubation in OR  Informed Consent: I have reviewed the patients History and Physical, chart, labs and discussed the procedure including the risks, benefits and alternatives for the proposed anesthesia with the patient or authorized representative who has indicated his/her understanding and acceptance.     Dental advisory given  Plan Discussed with: CRNA  Anesthesia Plan Comments:        Anesthesia Quick Evaluation

## 2021-06-10 LAB — ACID FAST SMEAR (AFB, MYCOBACTERIA): Acid Fast Smear: NEGATIVE

## 2021-06-10 LAB — CYTOLOGY - NON PAP

## 2021-06-11 LAB — CULTURE, BAL-QUANTITATIVE W GRAM STAIN: Culture: NO GROWTH

## 2021-06-12 ENCOUNTER — Encounter (HOSPITAL_COMMUNITY): Payer: Self-pay | Admitting: Pulmonary Disease

## 2021-06-15 LAB — AEROBIC/ANAEROBIC CULTURE W GRAM STAIN (SURGICAL/DEEP WOUND): Culture: NO GROWTH

## 2021-06-17 ENCOUNTER — Ambulatory Visit (INDEPENDENT_AMBULATORY_CARE_PROVIDER_SITE_OTHER): Payer: Medicare Other | Admitting: Acute Care

## 2021-06-17 ENCOUNTER — Encounter: Payer: Self-pay | Admitting: Acute Care

## 2021-06-17 ENCOUNTER — Other Ambulatory Visit: Payer: Self-pay

## 2021-06-17 VITALS — BP 150/70 | HR 104 | Temp 98.3°F | Ht 70.0 in | Wt 196.0 lb

## 2021-06-17 DIAGNOSIS — Z9889 Other specified postprocedural states: Secondary | ICD-10-CM

## 2021-06-17 DIAGNOSIS — Z85118 Personal history of other malignant neoplasm of bronchus and lung: Secondary | ICD-10-CM

## 2021-06-17 DIAGNOSIS — F17210 Nicotine dependence, cigarettes, uncomplicated: Secondary | ICD-10-CM

## 2021-06-17 DIAGNOSIS — Z720 Tobacco use: Secondary | ICD-10-CM

## 2021-06-17 DIAGNOSIS — Z902 Acquired absence of lung [part of]: Secondary | ICD-10-CM

## 2021-06-17 DIAGNOSIS — R918 Other nonspecific abnormal finding of lung field: Secondary | ICD-10-CM | POA: Diagnosis not present

## 2021-06-17 NOTE — Progress Notes (Signed)
History of Present Illness Benjamin Case is a 67 year old gentleman, current every day smoker with a 45 pack year smoking history, past medical history of diabetes, hepatitis C, hypertension, history of left pneumonectomy by Dr. 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  From respiratory standpoint doing well.  He had not had a surveillance scan since 2017.  Patient had a CT scan of the chest on 04/29/2021 by primary care. This revealed a 1.1 posterior apical right upper lobe lung nodule concerning for primary bronchogenic carcinoma also there was a 6 cm apical right upper lobe nodule . Patient was referred to Dr.  Valeta Harms for next steps. PET scan which had been ordered by patient 's PCP showed non-hypermetabolic nodule and suggested a 3 month follow up CT Chest. There was also notation of stable dilated main pulmonary artery, suggesting chronic pulmonary arterial hypertension. Three-vessel coronary atherosclerosis, and a Hypodense 1.7 cm left thyroid nodule, mildly increased from 1.4 cm. Recommend thyroid US. I do not see that this has been done.  Pt. Was seen by Dr. Valeta Harms who recommended Flexible video fiberoptic bronchoscopy with robotic assistance and biopsies. Because of patient's history of malignancy and the aggressive nature of the lesion macrolobulated, spiculated and associated pleural track  Dr. Valeta Harms was concerned about  another stage I malignancy in the right upper lobe.This was scheduled and done 06/09/2021. The patient is here for follow up. He does qualify for lung cancer screening.      06/17/2021 Pt. Presents for follow up. He underwent Flexible video fiberoptic bronchoscopy with robotic assistance and biopsies on 06/09/2021. He states he did well after the procedure. He did endorse a sore throat and soreness from his throat to his chest which resolved in 3 days.  We discussed the resulys of his cytology, no  malignant cells were identified.  We do need to have his PCP follow up on the thyroid finding. I have sent Dr. Stephanie Acre a message, and the patient and his wife will follow up with her. He denies any breathing issues. We have discussed the need to quit smoking completely.    Test Results: Cytology Results 06/09/2021  FINAL MICROSCOPIC DIAGNOSIS:   A. LUNG, RUL, BRUSHING:  - No malignant cells identified  - Benign reactive/reparative changes   B. LUNG, RUL, BIOPSY:  - No malignant cells identified  - Benign reactive/reparative changes   SPECIMEN ADEQUACY:  A. Satisfactory for Evaluation  B. Satisfactory for Evaluation   Micro/ Culture Results 06/09/2021 BAL >> NO Growth, aerobic or anaerobic organisms 06/09/2021 Fungal Cx is pending 06/09/2021 >> AFB is pending  PET Scan  IMPRESSION: The right upper lobe pulmonary nodule is not hypermetabolic but will require surveillance. Recommend follow-up noncontrast chest CT in 6 months. No enlarged or hypermetabolic mediastinal or hilar lymph nodes. Status post left pneumonectomy. No findings for recurrent cancer. Significant age advanced vascular calcifications  CT Chest 04/29/2021 Irregular solid 1.1 cm posterior apical right upper lobe pulmonary nodule, new, worrisome for metachronous primary bronchogenic carcinoma. PET-CT recommended for further characterization. Separate indistinct apical right upper lobe 0.6 cm nodule, also new, below PET resolution, recommend attention on follow-up chest CT in 3 months. No thoracic adenopathy or other potential findings of metastatic disease in the chest. Stable dilated main pulmonary artery, suggesting chronic pulmonary arterial hypertension. Three-vessel coronary atherosclerosis. Stable left adrenal adenoma. Hypodense 1.7 cm left thyroid nodule, mildly increased from 1.4 cm. Recommend thyroid  US (ref: J Am Coll Radiol. 2015 Feb;12(2): 143-50). Aortic Atherosclerosis (ICD10-I70.0) and Emphysema  (ICD10-J43.9 CBC Latest Ref Rng & Units 06/09/2021 12/11/2018 04/26/2016  WBC 4.0 - 10.5 K/uL 7.8 8.2 11.1(H)  Hemoglobin 13.0 - 17.0 g/dL 15.5 14.2 16.4  Hematocrit 39.0 - 52.0 % 44.2 41.6 49.6  Platelets 150 - 400 K/uL 259 260 346    BMP Latest Ref Rng & Units 06/09/2021 12/11/2018 04/26/2016  Glucose 70 - 99 mg/dL 102(H) 148(H) 127  BUN 8 - 23 mg/dL 12 17 6.0(L)  Creatinine 0.61 - 1.24 mg/dL 1.10 1.22 1.3  Sodium 135 - 145 mmol/L 135 132(L) 138  Potassium 3.5 - 5.1 mmol/L 3.6 4.6 5.0  Chloride 98 - 111 mmol/L 97(L) 97(L) -  CO2 22 - 32 mmol/L 27 27 30(H)  Calcium 8.9 - 10.3 mg/dL 9.0 9.3 9.8    BNP No results found for: BNP  ProBNP    Component Value Date/Time   PROBNP 377.0 (H) 01/03/2008 0545    PFT No results found for: FEV1PRE, FEV1POST, FVCPRE, FVCPOST, TLC, DLCOUNC, PREFEV1FVCRT, PSTFEV1FVCRT  DG Chest Port 1 View  Result Date: 06/09/2021 CLINICAL DATA:  Status post bronchoscopy and biopsy EXAM: PORTABLE CHEST 1 VIEW COMPARISON:  Chest radiographs, 03/12/2016, CT chest, 04/29/2021 FINDINGS: The mediastinal contours are largely obscured. Total opacification of the left hemithorax, unchanged. Subtle heterogeneous airspace opacity of the right upper lobe, in keeping with biopsy and washing of a spiculated nodule seen on prior CT. The visualized skeletal structures are unremarkable. IMPRESSION: 1. Total opacification of the left hemithorax, unchanged. 2. Subtle heterogeneous airspace opacity of the right upper lobe, in keeping with endobronchial biopsy and washing of a spiculated nodule seen on prior CT. No pneumothorax. Electronically Signed   By: Delanna Ahmadi M.D.   On: 06/09/2021 09:32   DG C-Arm 1-60 Min-No Report  Result Date: 06/09/2021 Fluoroscopy was utilized by the requesting physician.  No radiographic interpretation.   DG C-ARM BRONCHOSCOPY  Result Date: 06/09/2021 C-ARM BRONCHOSCOPY: Fluoroscopy was utilized by the requesting physician.  No radiographic interpretation.      Past medical hx Past Medical History:  Diagnosis Date   Arthritis    back   COPD (chronic obstructive pulmonary disease) ([place])    Diabetes mellitus without complication (HCC)    no medications , monitors cbg at home with monitor, avg no more than 150s at home    Dyspnea    increased exertion; pt states can climb flight of stairs w/o having to stop prior to reaching top ; ongoing reports it is due to "having one lung"    GERD (gastroesophageal reflux disease)    Hepatitis    hepatitis C; pt states competed treatments, unsure what medication he was treated with    History of chemotherapy    Hypertension    reports its been over a year since he stopped taking bp medication, reports he made her medical doctor aware that he was topping it    lung ca dx'd 12/2007   lt lobectomy   Neuropathy of both feet    Numbness    FEET BILATERAL   Pneumonia 2009   Rotator cuff tear    right      Social History   Tobacco Use   Smoking status: Every Day    Packs/day: 0.25    Years: 45.00    Pack years: 11.25    Types: Cigarettes   Smokeless tobacco: Never   Tobacco comments:    08/04/16 3-6 cigs daily  Vaping Use   Vaping Use: Never used  Substance Use Topics   Alcohol use: Yes    Comment: socially   Drug use: No    Mr.Ellegood reports that he has been smoking cigarettes. He has a 11.25 pack-year smoking history. He has never used smokeless tobacco. He reports current alcohol use. He reports that he does not use drugs.  Tobacco Cessation: Current every day smoker with a 45 pack year smoking history I have spent 3-4  minutes counseling patient on smoking cessation this visit. We have reviewed the risks of continued smoking on his current health situation. Patient verbalizes understanding of  these risks if he  continues smoking and the negative health consequences including worsening of COPD, risk of lung cancer , stroke and heart disease. We discussed nicotine replacement therapy as  well as behavior modification to help him quit smoking completely. We also provided his with the 1-800 QUIT NOW number for free nicotine replacement therapy, and numbers for hypnosis and acupuncture to help him quit.  .     Past surgical hx, Family hx, Social hx all reviewed.  Current Outpatient Medications on File Prior to Visit  Medication Sig   ALPRAZolam (XANAX) 1 MG tablet Take 1 mg by mouth 3 (three) times daily as needed for anxiety.   Ascorbic Acid (VITAMIN C) 1000 MG tablet Take 1,000 mg by mouth daily.   aspirin EC 81 MG tablet Take 81 mg by mouth daily.   Aspirin-Caffeine (BC FAST PAIN RELIEF) 845-65 MG PACK Take 1 packet by mouth daily as needed (pain.).   atorvastatin (LIPITOR) 10 MG tablet Take 10 mg by mouth at bedtime.    B Complex-C (SUPER B COMPLEX PO) Take 1 tablet by mouth daily.   esomeprazole (NEXIUM) 40 MG capsule Take 40 mg by mouth daily as needed (for heartburn).    ipratropium-albuterol (DUONEB) 0.5-2.5 (3) MG/3ML SOLN Take 3 mLs by nebulization in the morning, at noon, and at bedtime.   Omega-3 Fatty Acids ([employer] FISH OIL) 1000 MG CAPS Take 1,000 mg by mouth every evening.   oxyCODONE-acetaminophen (PERCOCET) 10-325 MG tablet Take 1 tablet by mouth 5 (five) times daily as needed for pain.   vitamin B-12 (CYANOCOBALAMIN) 1000 MCG tablet Take 1,000 mcg by mouth daily.   No current facility-administered medications on file prior to visit.     No Known Allergies  Review Of Systems:  Constitutional:   No  weight loss, night sweats,  Fevers, chills, fatigue, or  lassitude.  HEENT:   No headaches,  Difficulty swallowing,  Tooth/dental problems, or  Sore throat,                No sneezing, itching, ear ache, nasal congestion, post nasal drip,   CV:  No chest pain,  Orthopnea, PND, swelling in lower extremities, anasarca, dizziness, palpitations, syncope.   GI  No heartburn, indigestion, abdominal pain, nausea, vomiting, diarrhea, change in bowel habits, loss of  appetite, bloody stools.   Resp: No shortness of breath with exertion or at rest.  No excess mucus, no productive cough,  No non-productive cough,  No coughing up of blood.  No change in color of mucus.  No wheezing.  No chest wall deformity  Skin: no rash or lesions.  GU: no dysuria, change in color of urine, no urgency or frequency.  No flank pain, no hematuria   MS:  No joint pain or swelling.  No decreased range of motion.  No back pain.  Psych:  No change in mood or affect. No depression or anxiety.  No memory loss.   Vital Signs BP (!) 150/70 (BP Location: Left Arm, Cuff Size: Normal)    Pulse (!) 104    Temp 98.3 F (36.8 C) (Oral)    Ht _0  (1.778 m)    Wt 196 lb (88.9 kg)    SpO2 96%    BMI 28.12 kg/m    Physical Exam:  General- No distress,  A&Ox3, pleasant ENT: No sinus tenderness, TM clear, pale nasal mucosa, no oral exudate,no post nasal drip, no LAN Cardiac: S1, S2, regular rate and rhythm, no murmur Chest: No wheeze/ rales/ dullness; no accessory muscle use, no nasal flaring, no sternal retractions, diminished per L side Abd.: Soft Non-tender, ND, BS +, Body mass index is 28.12 kg/m.  Ext: No clubbing cyanosis, edema Neuro:  normal strength, MAE x 4, A&O x 3, appropriate Skin: No rashes, warm and dry, No lesions  Psych: normal mood and behavior, anxious about finding out about biopsy results   Assessment/Plan 1.1 posterior apical right upper lobe lung nodule concerning for primary bronchogenic carcinoma   6 cm apical right upper lobe nodule. History of left pneumonectomy by Dr. 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 Post Flexible video fiberoptic bronchoscopy with robotic assistance and biopsies on 06/09/2021 by Dr. Valeta Harms. Plan: Your biopsies were negative for malignant cells.  This is good news. We will do a 6 month follow up CT Chest to make sure the areas in your Right  upper lobe are stable.  Follow up OV with either Dr. Valeta Harms of Rakia Frayne NP after 6 month follow up CT Chest.  Please work on quitting smoking completely.  1-800 QUIT NOW for free nicotine patches gum or mints. Smoking cessation classes available at [facility] and [facility].  Follow up with Dr. Stephanie Acre regarding thyroid nodule ultrasound.  If 6 month follow up low dose CT Chest is stable, we will enroll you into the Lung Cancer Screening Program Please contact office for sooner follow up if symptoms do not improve or worsen or seek emergency care     HTN >> BP 150/70 today in the office ( ? Anxiety as he was anxious about biopsy results) Had been treated in the past, but BP got too low, so has been off antihypertensives for years He has a BP machine at home he can use to check BP in his home environment.  Plan Your BP was elevated today.  Please check your BP at home, and if these readings are > 140/80 please follow up with your PCP for BP management.   I spent 35 minutes minutes dedicated to the care of this patient on the date of this encounter to include pre-visit review of records, face-to-face time with the patient discussing conditions above, post visit ordering of testing, clinical documentation with the electronic health record, making appropriate referrals as documented, and communicating necessary information to the patient's healthcare team.    Magdalen Spatz, NP 06/17/2021  2:22 PM

## 2021-06-17 NOTE — Patient Instructions (Addendum)
It is good to see you today. Your biopsies were negative for malignant cells.  This is good news. We will do a 6 month follow up CT Chest to make sure the areas in your Right upper lobe are stable.  Follow up OV with either Dr. Valeta Harms of Anothony Bursch NP after 6 month follow up CT Chest.  Please work on quitting smoking completely.  1-800 QUIT NOW for free nicotine patches gum or mints. Smoking cessation classes available at [facility] and [facility].  Follow up with Dr. Stephanie Acre regarding thyroid nodule ultrasound.  If 6 month follow up low dose CT Chest is stable, we will enroll you into the Lung Cancer Screening Program Please contact office for sooner follow up if symptoms do not improve or worsen or seek emergency care    Your BP was elevated today.  Please check your BP at home, and if these readings are > 140/80 please follow up with your PCP for BP management.

## 2021-06-19 ENCOUNTER — Encounter: Payer: Self-pay | Admitting: *Deleted

## 2021-06-19 ENCOUNTER — Other Ambulatory Visit: Payer: Self-pay | Admitting: Acute Care

## 2021-06-19 DIAGNOSIS — R911 Solitary pulmonary nodule: Secondary | ICD-10-CM

## 2021-06-19 NOTE — Progress Notes (Signed)
I received a message from Dr. Valeta Harms that patient does not need to be seen with medical oncology and he is on observation with the pulmonary team. I relayed this to the new patient coordinator team.

## 2021-06-30 LAB — CULTURE, FUNGUS WITHOUT SMEAR

## 2021-07-13 ENCOUNTER — Other Ambulatory Visit: Payer: Self-pay | Admitting: Family Medicine

## 2021-07-13 DIAGNOSIS — E041 Nontoxic single thyroid nodule: Secondary | ICD-10-CM

## 2021-07-15 ENCOUNTER — Other Ambulatory Visit: Payer: Self-pay | Admitting: Family Medicine

## 2021-07-15 DIAGNOSIS — E041 Nontoxic single thyroid nodule: Secondary | ICD-10-CM

## 2021-07-23 LAB — ACID FAST CULTURE WITH REFLEXED SENSITIVITIES (MYCOBACTERIA): Acid Fast Culture: NEGATIVE

## 2021-08-03 ENCOUNTER — Ambulatory Visit
Admission: RE | Admit: 2021-08-03 | Discharge: 2021-08-03 | Disposition: A | Discharge status: Home / Self Care | Payer: Medicare Other | Source: Ambulatory Visit | Attending: Family Medicine | Admitting: Family Medicine

## 2021-08-03 DIAGNOSIS — E041 Nontoxic single thyroid nodule: Secondary | ICD-10-CM

## 2021-12-05 DIAGNOSIS — G5793 Unspecified mononeuropathy of bilateral lower limbs: Secondary | ICD-10-CM

## 2021-12-05 HISTORY — DX: Unspecified mononeuropathy of bilateral lower limbs: G57.93

## 2021-12-15 ENCOUNTER — Ambulatory Visit
Admission: RE | Admit: 2021-12-15 | Discharge: 2021-12-15 | Disposition: A | Discharge status: Home / Self Care | Payer: Medicare Other | Source: Ambulatory Visit | Attending: Acute Care | Admitting: Acute Care

## 2021-12-15 DIAGNOSIS — R911 Solitary pulmonary nodule: Secondary | ICD-10-CM

## 2021-12-15 MED ORDER — IOPAMIDOL (ISOVUE-300) INJECTION 61%
75.0000 mL | Freq: Once | INTRAVENOUS | Status: AC | PRN
  Administered 2021-12-15: 75 mL via INTRAVENOUS

## 2022-02-22 ENCOUNTER — Ambulatory Visit (INDEPENDENT_AMBULATORY_CARE_PROVIDER_SITE_OTHER): Payer: Medicare Other | Admitting: Pulmonary Disease

## 2022-02-22 ENCOUNTER — Encounter: Payer: Self-pay | Admitting: Pulmonary Disease

## 2022-02-22 VITALS — BP 130/70 | HR 73 | Ht 70.0 in | Wt 180.4 lb

## 2022-02-22 DIAGNOSIS — R911 Solitary pulmonary nodule: Secondary | ICD-10-CM | POA: Diagnosis not present

## 2022-02-22 DIAGNOSIS — R918 Other nonspecific abnormal finding of lung field: Secondary | ICD-10-CM

## 2022-02-22 DIAGNOSIS — Z85118 Personal history of other malignant neoplasm of bronchus and lung: Secondary | ICD-10-CM

## 2022-02-22 DIAGNOSIS — Z720 Tobacco use: Secondary | ICD-10-CM

## 2022-02-22 DIAGNOSIS — Z902 Acquired absence of lung [part of]: Secondary | ICD-10-CM

## 2022-02-22 DIAGNOSIS — Z9889 Other specified postprocedural states: Secondary | ICD-10-CM | POA: Diagnosis not present

## 2022-02-22 NOTE — Progress Notes (Signed)
Synopsis: Referred in December 2022 for lung nodule by [name], MD  Subjective:   PATIENT ID: Benjamin Case GENDER: male DOB: 1955/06/27, MRN: [id number]  Chief Complaint  Patient presents with   Follow-up    This is a 66 year old gentleman, past medical history of diabetes, hepatitis C, hypertension, history of left pneumonectomy by Dr. 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  From respiratory standpoint he is doing okay.  Patient had a CT scan of the chest on 04/29/2021 by primary care.  This revealed a 1.1 posterior apical right upper lobe lung nodule concerning for primary bronchogenic carcinoma also there was a 6 cm apical right upper lobe nodule patient was referred here for evaluation of lung nodule and neck steps.  Patient already had a PET scan ordered by primary care which is scheduled for mid week.  OV 02/22/2022:Here today for follow-up.  Last seen in the office on 06/17/2021 by SG, NP post bronchoscopy follow-up.  No malignant cells identified in the lung nodule that we biopsied in January.  Recommended to have close CT scan follow-up.  Patient had CT imaging completed in July 2023.  Patient had CT imaging of the chest completed in July 2023 which revealed right paratracheal adenopathy and cavitary enlargement of the pulmonary nodule concerning for malignancy.  Patient was supposed to have follow-up CT imaging in July as well as an appointment to discuss CT results.      Oncology History   No history exists.     Past Medical History:  Diagnosis Date   Arthritis    back   COPD (chronic obstructive pulmonary disease) (HCC)    Diabetes mellitus without complication (HCC)    no medications , monitors cbg at home with monitor, avg no more than 150s at home    Dyspnea    increased exertion; pt states can climb flight of stairs w/o having to stop prior to reaching top ; ongoing reports  it is due to "having one lung"    GERD (gastroesophageal reflux disease)    Hepatitis    hepatitis C; pt states competed treatments, unsure what medication he was treated with    History of chemotherapy    Hypertension    reports its been over a year since he stopped taking bp medication, reports he made her medical doctor aware that he was topping it    lung ca dx'd 12/2007   lt lobectomy   Neuropathy of both feet    Numbness    FEET BILATERAL   Pneumonia 2009   Rotator cuff tear    right      Family History  Problem Relation Age of Onset   Cancer Mother      Past Surgical History:  Procedure Laterality Date   ARTHROSCOPIC REPAIR ACL Right    BRONCHIAL BIOPSY  06/09/2021   Procedure: BRONCHIAL BIOPSIES;  Surgeon: Garner Nash, DO;  Location: [facility];  Service: Pulmonary;;   BRONCHIAL BRUSHINGS  06/09/2021   Procedure: BRONCHIAL BRUSHINGS;  Surgeon: Garner Nash, DO;  Location: [facility];  Service: Pulmonary;;   BRONCHIAL WASHINGS  06/09/2021   Procedure: BRONCHIAL WASHINGS;  Surgeon: Garner Nash, DO;  Location: [facility];  Service: Pulmonary;;   CATARACT EXTRACTION, BILATERAL  2019   DECOMPRESSIVE LUMBAR LAMINECTOMY LEVEL 1 N/A 03/24/2016   Procedure: L3-4 CENTRAL DECOMPRESSION LUMBER LAMINECTOMY;  Surgeon: Latanya Maudlin, MD;  Location: [facility];  Service: Orthopedics;  Laterality: N/A;   FOOT SURGERY Bilateral    HAND SURGERY Left    HAND SURGERY Right    LOBECTOMY Left 2009   SHOULDER ARTHROSCOPY WITH DISTAL CLAVICLE RESECTION Right 12/14/2018   Procedure: SHOULDER ARTHROSCOPY WITH DISTAL CLAVICLE RESECTION;  Surgeon: Tania Ade, MD;  Location: [facility];  Service: Orthopedics;  Laterality: Right;   SHOULDER ARTHROSCOPY WITH ROTATOR CUFF REPAIR Right 12/14/2018   Procedure: SHOULDER ARTHROSCOPY WITH ROTATOR CUFF REPAIR;  Surgeon: Tania Ade, MD;  Location: [facility];  Service: Orthopedics;  Laterality: Right;   SHOULDER ARTHROSCOPY WITH SUBACROMIAL  DECOMPRESSION Right 12/14/2018   Procedure: SHOULDER ARTHROSCOPY WITH SUBACROMIAL DECOMPRESSION;  Surgeon: Tania Ade, MD;  Location: [facility];  Service: Orthopedics;  Laterality: Right;   TRIGGER FINGER RELEASE     right hand, pointer finger    VIDEO BRONCHOSCOPY WITH RADIAL ENDOBRONCHIAL ULTRASOUND  06/09/2021   Procedure: VIDEO BRONCHOSCOPY WITH RADIAL ENDOBRONCHIAL ULTRASOUND;  Surgeon: Garner Nash, DO;  Location: [facility];  Service: Pulmonary;;    Social History   Socioeconomic History   Marital status: Married    Spouse name: Vaughan Basta   Number of children: 2   Years of education: 11   Highest education level: Not on file  Occupational History    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, Probation officer  Tobacco Use   Smoking status: Every Day    Packs/day: 0.25    Years: 45.00    Total pack years: 11.25    Types: Cigarettes   Smokeless tobacco: Never   Tobacco comments:    08/04/16 3-6 cigs daily 02/22/2022 Tay  Vaping Use   Vaping Use: Never used  Substance and Sexual Activity   Alcohol use: Yes    Comment: socially   Drug use: No   Sexual activity: Not on file  Other Topics Concern   Not on file  Social History Narrative   Lives with wife   Caffeine - coffee, 1 cup daily   Social Determinants of Health   Financial Resource Strain: Not on file  Food Insecurity: Not on file  Transportation Needs: Not on file  Physical Activity: Not on file  Stress: Not on file  Social Connections: Not on file  Intimate Partner Violence: Not on file     No Known Allergies   Outpatient Medications Prior to Visit  Medication Sig Dispense Refill   ipratropium-albuterol (DUONEB) 0.5-2.5 (3) MG/3ML SOLN Take 3 mLs by nebulization in the morning, at noon, and at bedtime.     ALPRAZolam (XANAX) 1 MG tablet Take 1 mg by mouth 3 (three) times daily as needed for anxiety.  5   Ascorbic Acid (VITAMIN C) 1000 MG tablet Take 1,000 mg by mouth daily.     aspirin EC 81 MG tablet Take 81 mg by mouth  daily.     Aspirin-Caffeine (BC FAST PAIN RELIEF) 845-65 MG PACK Take 1 packet by mouth daily as needed (pain.).     atorvastatin (LIPITOR) 10 MG tablet Take 10 mg by mouth at bedtime.      B Complex-C (SUPER B COMPLEX PO) Take 1 tablet by mouth daily.     esomeprazole (NEXIUM) 40 MG capsule Take 40 mg by mouth daily as needed (for heartburn).      Omega-3 Fatty Acids ([employer] FISH OIL) 1000 MG CAPS Take 1,000 mg by mouth every evening.     oxyCODONE-acetaminophen (PERCOCET) 10-325 MG tablet Take 1 tablet by mouth 5 (five) times daily as needed for pain.  vitamin B-12 (CYANOCOBALAMIN) 1000 MCG tablet Take 1,000 mcg by mouth daily.     No facility-administered medications prior to visit.    Review of Systems  Constitutional:  Negative for chills, fever, malaise/fatigue and weight loss.  HENT:  Negative for hearing loss, sore throat and tinnitus.   Eyes:  Negative for blurred vision and double vision.  Respiratory:  Positive for shortness of breath. Negative for cough, hemoptysis, sputum production, wheezing and stridor.   Cardiovascular:  Negative for chest pain, palpitations, orthopnea, leg swelling and PND.  Gastrointestinal:  Negative for abdominal pain, constipation, diarrhea, heartburn, nausea and vomiting.  Genitourinary:  Negative for dysuria, hematuria and urgency.  Musculoskeletal:  Negative for joint pain and myalgias.  Skin:  Negative for itching and rash.  Neurological:  Negative for dizziness, tingling, weakness and headaches.  Case/Heme/Allergies:  Negative for environmental allergies. Does not bruise/bleed easily.  Psychiatric/Behavioral:  Negative for depression. The patient is not nervous/anxious and does not have insomnia.   All other systems reviewed and are negative.    Objective:  Physical Exam Vitals reviewed.  Constitutional:      General: He is not in acute distress.    Appearance: He is well-developed.  HENT:     Head: Normocephalic and atraumatic.  Eyes:      General: No scleral icterus.    Conjunctiva/sclera: Conjunctivae normal.     Pupils: Pupils are equal, round, and reactive to light.  Neck:     Vascular: No JVD.     Trachea: No tracheal deviation.  Cardiovascular:     Rate and Rhythm: Normal rate and regular rhythm.     Heart sounds: Normal heart sounds. No murmur heard. Pulmonary:     Effort: Pulmonary effort is normal. No tachypnea, accessory muscle usage or respiratory distress.     Breath sounds: Normal breath sounds. No stridor. No wheezing, rhonchi or rales.     Comments: Absent breath sounds on the left chest Abdominal:     General: There is no distension.     Palpations: Abdomen is soft.     Tenderness: There is no abdominal tenderness.  Musculoskeletal:        General: No tenderness.     Cervical back: Neck supple.  Lymphadenopathy:     Cervical: No cervical adenopathy.  Skin:    General: Skin is warm and dry.     Capillary Refill: Capillary refill takes less than 2 seconds.     Findings: No rash.  Neurological:     Mental Status: He is alert and oriented to person, place, and time.  Psychiatric:        Behavior: Behavior normal.      Vitals:   02/22/22 1327  BP: 130/70  Pulse: 73  SpO2: 95%  Weight: 180 lb 6.4 oz (81.8 kg)  Height: 5\' 10"  (1.778 m)    95% on RA BMI Readings from Last 3 Encounters:  02/22/22 25.88 kg/m  06/17/21 28.12 kg/m  06/09/21 27.98 kg/m   Wt Readings from Last 3 Encounters:  02/22/22 180 lb 6.4 oz (81.8 kg)  06/17/21 196 lb (88.9 kg)  06/09/21 195 lb (88.5 kg)     CBC    Component Value Date/Time   WBC 7.8 06/09/2021 0548   RBC 4.41 06/09/2021 0548   HGB 15.5 06/09/2021 0548   HGB 16.4 04/26/2016 1205   HCT 44.2 06/09/2021 0548   HCT 49.6 04/26/2016 1205   PLT 259 06/09/2021 0548   PLT 346 04/26/2016 1205  MCV 100.2 (H) 06/09/2021 0548   MCV 105.2 (H) 04/26/2016 1205   MCH 35.1 (H) 06/09/2021 0548   MCHC 35.1 06/09/2021 0548   RDW 11.6 06/09/2021 0548    RDW 13.1 04/26/2016 1205   LYMPHSABS 2.0 04/26/2016 1205   MONOABS 0.9 04/26/2016 1205   EOSABS 0.2 04/26/2016 1205   BASOSABS 0.1 04/26/2016 1205    Chest Imaging: November 2022 CT chest: 1.1 cm right upper lobe pulmonary nodule concerning for primary bronchogenic carcinoma. The patient's images have been independently reviewed by me.    September 2023 CT chest: Right upper lobe lung nodule started to cavitate slightly larger concerning for malignancy. Lymph node in the pretracheal region slightly enlarged. The patient's images have been independently reviewed by me.     Pulmonary Functions Testing Results:     No data to display          FeNO:   Pathology:   Echocardiogram:   Heart Catheterization:     Assessment & Plan:     ICD-10-CM   1. Right upper lobe pulmonary nodule  R91.1 Procedural/ Surgical Case Request: ROBOTIC ASSISTED NAVIGATIONAL BRONCHOSCOPY, VIDEO BRONCHOSCOPY WITH ENDOBRONCHIAL ULTRASOUND    Ambulatory referral to Pulmonology    NM PET Image Initial (PI) Skull Base To Thigh (F-18 FDG)    CT Super D Chest Wo Contrast    2. Hx of cancer of lung  Z85.118 Procedural/ Surgical Case Request: ROBOTIC ASSISTED NAVIGATIONAL BRONCHOSCOPY, VIDEO BRONCHOSCOPY WITH ENDOBRONCHIAL ULTRASOUND    Ambulatory referral to Pulmonology    NM PET Image Initial (PI) Skull Base To Thigh (F-18 FDG)    3. History of pneumonectomy  Z98.890    Z90.2     4. Tobacco abuse  Z72.0     5. History of lung cancer  Z85.118       Discussion:  This is a 66 year old gentleman, history of stage III lung cancer 2009 status post pneumonectomy on the left side now with a right pulmonary nodule in the upper lobe posterior segment as well as 1 within the azygous lobe.  Also has a small hilar lymph node that is enlarged.  Plan: I have ordered a PET scan. We need to have this complete prior to do his bronchoscopy appointment. We discussed risk benefits and alternatives of proceeding  with robotic assisted navigational bronchoscopy. Patient is agreeable to proceed. He also has a new small lymph node that will need to be biopsied. We will set him up for navigational bronchoscopy plus videobronchoscope endobronchial ultrasound. I will get a repeat super D CT. Unfortunately my next available day for bronchoscopy is going to be 03/23/2022. We appreciate PCC's help with scheduling    Current Outpatient Medications:    ipratropium-albuterol (DUONEB) 0.5-2.5 (3) MG/3ML SOLN, Take 3 mLs by nebulization in the morning, at noon, and at bedtime., Disp: , Rfl:    ALPRAZolam (XANAX) 1 MG tablet, Take 1 mg by mouth 3 (three) times daily as needed for anxiety., Disp: , Rfl: 5   Ascorbic Acid (VITAMIN C) 1000 MG tablet, Take 1,000 mg by mouth daily., Disp: , Rfl:    aspirin EC 81 MG tablet, Take 81 mg by mouth daily., Disp: , Rfl:    Aspirin-Caffeine (BC FAST PAIN RELIEF) 845-65 MG PACK, Take 1 packet by mouth daily as needed (pain.)., Disp: , Rfl:    atorvastatin (LIPITOR) 10 MG tablet, Take 10 mg by mouth at bedtime. , Disp: , Rfl:    B Complex-C (SUPER B COMPLEX PO), Take  1 tablet by mouth daily., Disp: , Rfl:    esomeprazole (NEXIUM) 40 MG capsule, Take 40 mg by mouth daily as needed (for heartburn). , Disp: , Rfl:    Omega-3 Fatty Acids ([employer] FISH OIL) 1000 MG CAPS, Take 1,000 mg by mouth every evening., Disp: , Rfl:    oxyCODONE-acetaminophen (PERCOCET) 10-325 MG tablet, Take 1 tablet by mouth 5 (five) times daily as needed for pain., Disp: , Rfl:    vitamin B-12 (CYANOCOBALAMIN) 1000 MCG tablet, Take 1,000 mcg by mouth daily., Disp: , Rfl:   I spent 42 minutes dedicated to the care of this patient on the date of this encounter to include pre-visit review of records, face-to-face time with the patient discussing conditions above, post visit ordering of testing, clinical documentation with the electronic health record, making appropriate referrals as documented, and communicating  necessary findings to members of the patients care team.    Garner Nash, [facility] 02/22/2022 1:56 PM

## 2022-02-22 NOTE — H&P (View-Only) (Signed)
Synopsis: Referred in December 2022 for lung nodule by [name], MD  Subjective:   PATIENT ID: Benjamin Case GENDER: male DOB: 02-Apr-1956, MRN: [id number]  Chief Complaint  Patient presents with   Follow-up    This is a 66 year old gentleman, past medical history of diabetes, hepatitis C, hypertension, history of left pneumonectomy by Dr. 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  From respiratory standpoint he is doing okay.  Patient had a CT scan of the chest on 04/29/2021 by primary care.  This revealed a 1.1 posterior apical right upper lobe lung nodule concerning for primary bronchogenic carcinoma also there was a 6 cm apical right upper lobe nodule patient was referred here for evaluation of lung nodule and neck steps.  Patient already had a PET scan ordered by primary care which is scheduled for mid week.  OV 02/22/2022:Here today for follow-up.  Last seen in the office on 06/17/2021 by SG, NP post bronchoscopy follow-up.  No malignant cells identified in the lung nodule that we biopsied in January.  Recommended to have close CT scan follow-up.  Patient had CT imaging completed in July 2023.  Patient had CT imaging of the chest completed in July 2023 which revealed right paratracheal adenopathy and cavitary enlargement of the pulmonary nodule concerning for malignancy.  Patient was supposed to have follow-up CT imaging in July as well as an appointment to discuss CT results.      Oncology History   No history exists.     Past Medical History:  Diagnosis Date   Arthritis    back   COPD (chronic obstructive pulmonary disease) (HCC)    Diabetes mellitus without complication (HCC)    no medications , monitors cbg at home with monitor, avg no more than 150s at home    Dyspnea    increased exertion; pt states can climb flight of stairs w/o having to stop prior to reaching top ; ongoing reports  it is due to "having one lung"    GERD (gastroesophageal reflux disease)    Hepatitis    hepatitis C; pt states competed treatments, unsure what medication he was treated with    History of chemotherapy    Hypertension    reports its been over a year since he stopped taking bp medication, reports he made her medical doctor aware that he was topping it    lung ca dx'd 12/2007   lt lobectomy   Neuropathy of both feet    Numbness    FEET BILATERAL   Pneumonia 2009   Rotator cuff tear    right      Family History  Problem Relation Age of Onset   Cancer Mother      Past Surgical History:  Procedure Laterality Date   ARTHROSCOPIC REPAIR ACL Right    BRONCHIAL BIOPSY  06/09/2021   Procedure: BRONCHIAL BIOPSIES;  Surgeon: Garner Nash, DO;  Location: [facility];  Service: Pulmonary;;   BRONCHIAL BRUSHINGS  06/09/2021   Procedure: BRONCHIAL BRUSHINGS;  Surgeon: Garner Nash, DO;  Location: [facility];  Service: Pulmonary;;   BRONCHIAL WASHINGS  06/09/2021   Procedure: BRONCHIAL WASHINGS;  Surgeon: Garner Nash, DO;  Location: [facility];  Service: Pulmonary;;   CATARACT EXTRACTION, BILATERAL  2019   DECOMPRESSIVE LUMBAR LAMINECTOMY LEVEL 1 N/A 03/24/2016   Procedure: L3-4 CENTRAL DECOMPRESSION LUMBER LAMINECTOMY;  Surgeon: Latanya Maudlin, MD;  Location: [facility];  Service: Orthopedics;  Laterality: N/A;   FOOT SURGERY Bilateral    HAND SURGERY Left    HAND SURGERY Right    LOBECTOMY Left 2009   SHOULDER ARTHROSCOPY WITH DISTAL CLAVICLE RESECTION Right 12/14/2018   Procedure: SHOULDER ARTHROSCOPY WITH DISTAL CLAVICLE RESECTION;  Surgeon: Tania Ade, MD;  Location: [facility];  Service: Orthopedics;  Laterality: Right;   SHOULDER ARTHROSCOPY WITH ROTATOR CUFF REPAIR Right 12/14/2018   Procedure: SHOULDER ARTHROSCOPY WITH ROTATOR CUFF REPAIR;  Surgeon: Tania Ade, MD;  Location: [facility];  Service: Orthopedics;  Laterality: Right;   SHOULDER ARTHROSCOPY WITH SUBACROMIAL  DECOMPRESSION Right 12/14/2018   Procedure: SHOULDER ARTHROSCOPY WITH SUBACROMIAL DECOMPRESSION;  Surgeon: Tania Ade, MD;  Location: [facility];  Service: Orthopedics;  Laterality: Right;   TRIGGER FINGER RELEASE     right hand, pointer finger    VIDEO BRONCHOSCOPY WITH RADIAL ENDOBRONCHIAL ULTRASOUND  06/09/2021   Procedure: VIDEO BRONCHOSCOPY WITH RADIAL ENDOBRONCHIAL ULTRASOUND;  Surgeon: Garner Nash, DO;  Location: [facility];  Service: Pulmonary;;    Social History   Socioeconomic History   Marital status: Married    Spouse name: Vaughan Basta   Number of children: 2   Years of education: 11   Highest education level: Not on file  Occupational History    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, Probation officer  Tobacco Use   Smoking status: Every Day    Packs/day: 0.25    Years: 45.00    Total pack years: 11.25    Types: Cigarettes   Smokeless tobacco: Never   Tobacco comments:    08/04/16 3-6 cigs daily 02/22/2022 Tay  Vaping Use   Vaping Use: Never used  Substance and Sexual Activity   Alcohol use: Yes    Comment: socially   Drug use: No   Sexual activity: Not on file  Other Topics Concern   Not on file  Social History Narrative   Lives with wife   Caffeine - coffee, 1 cup daily   Social Determinants of Health   Financial Resource Strain: Not on file  Food Insecurity: Not on file  Transportation Needs: Not on file  Physical Activity: Not on file  Stress: Not on file  Social Connections: Not on file  Intimate Partner Violence: Not on file     No Known Allergies   Outpatient Medications Prior to Visit  Medication Sig Dispense Refill   ipratropium-albuterol (DUONEB) 0.5-2.5 (3) MG/3ML SOLN Take 3 mLs by nebulization in the morning, at noon, and at bedtime.     ALPRAZolam (XANAX) 1 MG tablet Take 1 mg by mouth 3 (three) times daily as needed for anxiety.  5   Ascorbic Acid (VITAMIN C) 1000 MG tablet Take 1,000 mg by mouth daily.     aspirin EC 81 MG tablet Take 81 mg by mouth  daily.     Aspirin-Caffeine (BC FAST PAIN RELIEF) 845-65 MG PACK Take 1 packet by mouth daily as needed (pain.).     atorvastatin (LIPITOR) 10 MG tablet Take 10 mg by mouth at bedtime.      B Complex-C (SUPER B COMPLEX PO) Take 1 tablet by mouth daily.     esomeprazole (NEXIUM) 40 MG capsule Take 40 mg by mouth daily as needed (for heartburn).      Omega-3 Fatty Acids ([employer] FISH OIL) 1000 MG CAPS Take 1,000 mg by mouth every evening.     oxyCODONE-acetaminophen (PERCOCET) 10-325 MG tablet Take 1 tablet by mouth 5 (five) times daily as needed for pain.  vitamin B-12 (CYANOCOBALAMIN) 1000 MCG tablet Take 1,000 mcg by mouth daily.     No facility-administered medications prior to visit.    Review of Systems  Constitutional:  Negative for chills, fever, malaise/fatigue and weight loss.  HENT:  Negative for hearing loss, sore throat and tinnitus.   Eyes:  Negative for blurred vision and double vision.  Respiratory:  Positive for shortness of breath. Negative for cough, hemoptysis, sputum production, wheezing and stridor.   Cardiovascular:  Negative for chest pain, palpitations, orthopnea, leg swelling and PND.  Gastrointestinal:  Negative for abdominal pain, constipation, diarrhea, heartburn, nausea and vomiting.  Genitourinary:  Negative for dysuria, hematuria and urgency.  Musculoskeletal:  Negative for joint pain and myalgias.  Skin:  Negative for itching and rash.  Neurological:  Negative for dizziness, tingling, weakness and headaches.  Case/Heme/Allergies:  Negative for environmental allergies. Does not bruise/bleed easily.  Psychiatric/Behavioral:  Negative for depression. The patient is not nervous/anxious and does not have insomnia.   All other systems reviewed and are negative.    Objective:  Physical Exam Vitals reviewed.  Constitutional:      General: He is not in acute distress.    Appearance: He is well-developed.  HENT:     Head: Normocephalic and atraumatic.  Eyes:      General: No scleral icterus.    Conjunctiva/sclera: Conjunctivae normal.     Pupils: Pupils are equal, round, and reactive to light.  Neck:     Vascular: No JVD.     Trachea: No tracheal deviation.  Cardiovascular:     Rate and Rhythm: Normal rate and regular rhythm.     Heart sounds: Normal heart sounds. No murmur heard. Pulmonary:     Effort: Pulmonary effort is normal. No tachypnea, accessory muscle usage or respiratory distress.     Breath sounds: Normal breath sounds. No stridor. No wheezing, rhonchi or rales.     Comments: Absent breath sounds on the left chest Abdominal:     General: There is no distension.     Palpations: Abdomen is soft.     Tenderness: There is no abdominal tenderness.  Musculoskeletal:        General: No tenderness.     Cervical back: Neck supple.  Lymphadenopathy:     Cervical: No cervical adenopathy.  Skin:    General: Skin is warm and dry.     Capillary Refill: Capillary refill takes less than 2 seconds.     Findings: No rash.  Neurological:     Mental Status: He is alert and oriented to person, place, and time.  Psychiatric:        Behavior: Behavior normal.      Vitals:   02/22/22 1327  BP: 130/70  Pulse: 73  SpO2: 95%  Weight: 180 lb 6.4 oz (81.8 kg)  Height: 5\' 10"  (1.778 m)    95% on RA BMI Readings from Last 3 Encounters:  02/22/22 25.88 kg/m  06/17/21 28.12 kg/m  06/09/21 27.98 kg/m   Wt Readings from Last 3 Encounters:  02/22/22 180 lb 6.4 oz (81.8 kg)  06/17/21 196 lb (88.9 kg)  06/09/21 195 lb (88.5 kg)     CBC    Component Value Date/Time   WBC 7.8 06/09/2021 0548   RBC 4.41 06/09/2021 0548   HGB 15.5 06/09/2021 0548   HGB 16.4 04/26/2016 1205   HCT 44.2 06/09/2021 0548   HCT 49.6 04/26/2016 1205   PLT 259 06/09/2021 0548   PLT 346 04/26/2016 1205  MCV 100.2 (H) 06/09/2021 0548   MCV 105.2 (H) 04/26/2016 1205   MCH 35.1 (H) 06/09/2021 0548   MCHC 35.1 06/09/2021 0548   RDW 11.6 06/09/2021 0548    RDW 13.1 04/26/2016 1205   LYMPHSABS 2.0 04/26/2016 1205   MONOABS 0.9 04/26/2016 1205   EOSABS 0.2 04/26/2016 1205   BASOSABS 0.1 04/26/2016 1205    Chest Imaging: November 2022 CT chest: 1.1 cm right upper lobe pulmonary nodule concerning for primary bronchogenic carcinoma. The patient's images have been independently reviewed by me.    September 2023 CT chest: Right upper lobe lung nodule started to cavitate slightly larger concerning for malignancy. Lymph node in the pretracheal region slightly enlarged. The patient's images have been independently reviewed by me.     Pulmonary Functions Testing Results:     No data to display          FeNO:   Pathology:   Echocardiogram:   Heart Catheterization:     Assessment & Plan:     ICD-10-CM   1. Right upper lobe pulmonary nodule  R91.1 Procedural/ Surgical Case Request: ROBOTIC ASSISTED NAVIGATIONAL BRONCHOSCOPY, VIDEO BRONCHOSCOPY WITH ENDOBRONCHIAL ULTRASOUND    Ambulatory referral to Pulmonology    NM PET Image Initial (PI) Skull Base To Thigh (F-18 FDG)    CT Super D Chest Wo Contrast    2. Hx of cancer of lung  Z85.118 Procedural/ Surgical Case Request: ROBOTIC ASSISTED NAVIGATIONAL BRONCHOSCOPY, VIDEO BRONCHOSCOPY WITH ENDOBRONCHIAL ULTRASOUND    Ambulatory referral to Pulmonology    NM PET Image Initial (PI) Skull Base To Thigh (F-18 FDG)    3. History of pneumonectomy  Z98.890    Z90.2     4. Tobacco abuse  Z72.0     5. History of lung cancer  Z85.118       Discussion:  This is a 66 year old gentleman, history of stage III lung cancer 2009 status post pneumonectomy on the left side now with a right pulmonary nodule in the upper lobe posterior segment as well as 1 within the azygous lobe.  Also has a small hilar lymph node that is enlarged.  Plan: I have ordered a PET scan. We need to have this complete prior to do his bronchoscopy appointment. We discussed risk benefits and alternatives of proceeding  with robotic assisted navigational bronchoscopy. Patient is agreeable to proceed. He also has a new small lymph node that will need to be biopsied. We will set him up for navigational bronchoscopy plus videobronchoscope endobronchial ultrasound. I will get a repeat super D CT. Unfortunately my next available day for bronchoscopy is going to be 03/23/2022. We appreciate PCC's help with scheduling    Current Outpatient Medications:    ipratropium-albuterol (DUONEB) 0.5-2.5 (3) MG/3ML SOLN, Take 3 mLs by nebulization in the morning, at noon, and at bedtime., Disp: , Rfl:    ALPRAZolam (XANAX) 1 MG tablet, Take 1 mg by mouth 3 (three) times daily as needed for anxiety., Disp: , Rfl: 5   Ascorbic Acid (VITAMIN C) 1000 MG tablet, Take 1,000 mg by mouth daily., Disp: , Rfl:    aspirin EC 81 MG tablet, Take 81 mg by mouth daily., Disp: , Rfl:    Aspirin-Caffeine (BC FAST PAIN RELIEF) 845-65 MG PACK, Take 1 packet by mouth daily as needed (pain.)., Disp: , Rfl:    atorvastatin (LIPITOR) 10 MG tablet, Take 10 mg by mouth at bedtime. , Disp: , Rfl:    B Complex-C (SUPER B COMPLEX PO), Take  1 tablet by mouth daily., Disp: , Rfl:    esomeprazole (NEXIUM) 40 MG capsule, Take 40 mg by mouth daily as needed (for heartburn). , Disp: , Rfl:    Omega-3 Fatty Acids ([employer] FISH OIL) 1000 MG CAPS, Take 1,000 mg by mouth every evening., Disp: , Rfl:    oxyCODONE-acetaminophen (PERCOCET) 10-325 MG tablet, Take 1 tablet by mouth 5 (five) times daily as needed for pain., Disp: , Rfl:    vitamin B-12 (CYANOCOBALAMIN) 1000 MCG tablet, Take 1,000 mcg by mouth daily., Disp: , Rfl:   I spent 42 minutes dedicated to the care of this patient on the date of this encounter to include pre-visit review of records, face-to-face time with the patient discussing conditions above, post visit ordering of testing, clinical documentation with the electronic health record, making appropriate referrals as documented, and communicating  necessary findings to members of the patients care team.    Garner Nash, [facility] 02/22/2022 1:56 PM

## 2022-02-22 NOTE — Patient Instructions (Signed)
Thank you for visiting Dr. Valeta Harms at [facility]. Today we recommend the following:  Orders Placed This Encounter  Procedures   Procedural/ Surgical Case Request: ROBOTIC ASSISTED NAVIGATIONAL BRONCHOSCOPY, VIDEO BRONCHOSCOPY WITH ENDOBRONCHIAL ULTRASOUND   NM PET Image Initial (PI) Skull Base To Thigh (F-18 FDG)   CT Super D Chest Wo Contrast   Ambulatory referral to Pulmonology   Bronchoscopy on 03/23/2022  Return in about 5 weeks (around 03/30/2022) for with Eric Form, NP.    Please do your part to reduce the spread of COVID-19.

## 2022-03-12 ENCOUNTER — Encounter (HOSPITAL_COMMUNITY)
Admission: RE | Admit: 2022-03-12 | Discharge: 2022-03-12 | Disposition: A | Discharge status: Home / Self Care | Payer: Medicare Other | Source: Ambulatory Visit | Attending: Pulmonary Disease | Admitting: Pulmonary Disease

## 2022-03-12 DIAGNOSIS — Z85118 Personal history of other malignant neoplasm of bronchus and lung: Secondary | ICD-10-CM | POA: Insufficient documentation

## 2022-03-12 DIAGNOSIS — R918 Other nonspecific abnormal finding of lung field: Secondary | ICD-10-CM | POA: Diagnosis present

## 2022-03-12 DIAGNOSIS — R911 Solitary pulmonary nodule: Secondary | ICD-10-CM | POA: Diagnosis present

## 2022-03-12 LAB — GLUCOSE, CAPILLARY: Glucose-Capillary: 114 mg/dL — ABNORMAL HIGH (ref 70–99)

## 2022-03-12 MED ORDER — FLUDEOXYGLUCOSE F - 18 (FDG) INJECTION
8.9300 | Freq: Once | INTRAVENOUS | Status: AC
  Administered 2022-03-12: 8.93 via INTRAVENOUS

## 2022-03-15 ENCOUNTER — Telehealth: Payer: Self-pay | Admitting: Student

## 2022-03-15 NOTE — Telephone Encounter (Signed)
Called and discussed PET/CT results with pt's wife over the phone concerning for metastatic disease. No change in plan for super D CT Chest 10/11 and bronch tentatively 10/17.

## 2022-03-17 ENCOUNTER — Ambulatory Visit (HOSPITAL_COMMUNITY)
Admission: RE | Admit: 2022-03-17 | Discharge: 2022-03-17 | Disposition: A | Discharge status: Home / Self Care | Payer: Medicare Other | Source: Ambulatory Visit | Attending: Pulmonary Disease | Admitting: Pulmonary Disease

## 2022-03-17 DIAGNOSIS — R911 Solitary pulmonary nodule: Secondary | ICD-10-CM | POA: Diagnosis not present

## 2022-03-19 ENCOUNTER — Telehealth: Payer: Self-pay | Admitting: Pulmonary Disease

## 2022-03-19 ENCOUNTER — Other Ambulatory Visit: Payer: Medicare Other

## 2022-03-19 DIAGNOSIS — R918 Other nonspecific abnormal finding of lung field: Secondary | ICD-10-CM

## 2022-03-19 NOTE — Telephone Encounter (Signed)
Called and spoke with pt's spouse letting her know that we would send the message to Dr. Valeta Harms for him to review pt's CT and once he responded we would let them know what he said and she verbalized understanding. Routing to Dr. Valeta Harms. Please advise.

## 2022-03-19 NOTE — Telephone Encounter (Signed)
Called and again discussed PET/CT results with pt's wife over the phone concerning for metastatic disease. No change in plan for bronch tentatively 10/17.

## 2022-03-21 LAB — NOVEL CORONAVIRUS, NAA: SARS-CoV-2, NAA: NOT DETECTED

## 2022-03-21 LAB — SPECIMEN STATUS REPORT

## 2022-03-22 ENCOUNTER — Other Ambulatory Visit: Payer: Self-pay

## 2022-03-22 ENCOUNTER — Encounter (HOSPITAL_COMMUNITY): Payer: Self-pay | Admitting: Pulmonary Disease

## 2022-03-22 NOTE — Anesthesia Preprocedure Evaluation (Signed)
Anesthesia Evaluation  Patient identified by MRN, date of birth, ID band Patient awake    Reviewed: Allergy & Precautions, NPO status , Patient's Chart, lab work & pertinent test results  Airway Mallampati: III  TM Distance: <3 FB Neck ROM: Full    Dental no notable dental hx.    Pulmonary shortness of breath, COPD, Current Smoker and Patient abstained from smoking.,  He is s/p left pneumonectomy and chemotherapy in 2009 for lung cancer. S/p bronchoscopy 06/09/21 for evaluation of a RUL pulmonary nodule not hypermetabolic on 82/9/56 PET scan. No malignant cells on cytology, but follow-up chest CT in July 2023 revealed right tracheal adenopathy and cavitary enlargement of RUL nodule concerning for malignancy   breath sounds clear to auscultation + decreased breath sounds      Cardiovascular hypertension, Normal cardiovascular exam Rhythm:Regular Rate:Normal     Neuro/Psych negative neurological ROS  negative psych ROS   GI/Hepatic Neg liver ROS, GERD  ,  Endo/Other  diabetes, Type 2  Renal/GU negative Renal ROS  negative genitourinary   Musculoskeletal negative musculoskeletal ROS (+)   Abdominal   Peds negative pediatric ROS (+)  Hematology negative hematology ROS (+)   Anesthesia Other Findings   Reproductive/Obstetrics negative OB ROS                            Anesthesia Physical Anesthesia Plan  ASA: 3  Anesthesia Plan: General   Post-op Pain Management: Minimal or no pain anticipated   Induction: Intravenous  PONV Risk Score and Plan: 1 and Ondansetron and Treatment may vary due to age or medical condition  Airway Management Planned: Oral ETT  Additional Equipment:   Intra-op Plan:   Post-operative Plan: Extubation in OR  Informed Consent: I have reviewed the patients History and Physical, chart, labs and discussed the procedure including the risks, benefits and alternatives  for the proposed anesthesia with the patient or authorized representative who has indicated his/her understanding and acceptance.     Dental advisory given  Plan Discussed with: CRNA and Surgeon  Anesthesia Plan Comments: (PAT note written 03/22/2022 by Myra Gianotti, PA-C. )       Anesthesia Quick Evaluation

## 2022-03-22 NOTE — Progress Notes (Signed)
Anesthesia Chart Review: Benjamin Case  Case: [id number] Date/Time: 03/23/22 0915   Procedures:      ROBOTIC ASSISTED NAVIGATIONAL BRONCHOSCOPY (Right)     VIDEO BRONCHOSCOPY WITH ENDOBRONCHIAL ULTRASOUND (Right)   Anesthesia type: General   Diagnosis:      Right upper lobe pulmonary nodule [R91.1]     Hx of cancer of lung [Z85.118]   Pre-op diagnosis: Lung nodule   Location: [facility] / [facility]   Surgeons: Garner Nash, DO       DISCUSSION: Patient is a 66 year old male scheduled for the above procedure. He is s/p left pneumonectomy and chemotherapy in 2009 for lung cancer. S/p bronchoscopy 06/09/21 for evaluation of a RUL pulmonary nodule not hypermetabolic on 63/3/35 PET scan. No malignant cells on cytology, but follow-up chest CT in July 2023 revealed right tracheal adenopathy and cavitary enlargement of RUL nodule concerning for malignancy. He had follow-up with Dr. Valeta Harms on 02/22/22 and has since had a PET scan, Super D Chest CT, and is now scheduled for the above procedure.   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   Anesthesia team to evaluate on the day of surgery.  03/19/2022 presurgical COVID-19 test was negative.   VS: Ht 5\' 10"  (1.778 m)   Wt 82.1 kg   BMI 25.97 kg/m  BP Readings from Last 3 Encounters:  02/22/22 130/70  06/17/21 (!) 150/70  06/09/21 (!) 147/82   Pulse Readings from Last 3 Encounters:  02/22/22 73  06/17/21 (!) 104  06/09/21 76     PROVIDERS: [name], MD is PCP  Curt Bears, MD is HEM-ONC   LABS: For labs on the day of surgery as indicated.  Most recent results in [facility] include: Lab Results   Component Value Date   WBC 7.8 06/09/2021   HGB 15.5 06/09/2021   HCT 44.2 06/09/2021   PLT 259 06/09/2021   GLUCOSE 102 (H) 06/09/2021   ALT 39 12/11/2018   AST 32 12/11/2018   NA 135 06/09/2021   K 3.6 06/09/2021   CL 97 (L) 06/09/2021   CREATININE 1.10 06/09/2021   BUN 12 06/09/2021   CO2 27 06/09/2021   INR 0.86 03/22/2016   HGBA1C 5.8 (H) 12/11/2018     IMAGES: CT Super D Chest 03/17/22: IMPRESSION: 1. Centrally necrotic precarinal lymph node has significantly enlarged from the previous chest CT and was hypermetabolic on recent PET-CT, consistent with metastatic disease. 2. Enlarging and increasingly solid/cavitary right upper lobe pulmonary nodules, favoring primary lung cancer, although potentially metastases. 3. Subtle osseous metastases as correlated with PET-CT. 4. No recurrence identified in the left hemithorax post pneumonectomy. 5.  Aortic Atherosclerosis (ICD10-I70.0).   PET Scan 03/12/22: IMPRESSION: 1. Hypermetabolic mediastinal and right hilar lymph nodes, consistent with metastatic adenopathy. 2. Subcarinal lymph node is increased in size compared with prior exam and demonstrates mild FDG uptake, concerning for additional site of nodal metastatic disease. 3. Mildly hypermetabolic solid pulmonary nodules of the right upper lobe, concerning for primary lung malignancy or metastatic disease. 4. Numerous hypermetabolic osseous lesions involving the manubrium, right scapula, thoracolumbar spine, bilateral ribs, and pelvis. 5. Scattered areas of focal hypermetabolic activity seen within the musculature of the bilateral buttocks, left psoas muscle and left cervical paraspinal musculature with no definite  correlate seen on CT, concerning for soft tissue metastatic disease. 6. Aortic Atherosclerosis (ICD10-I70.0) and Emphysema (ICD10-J43.9).   US Thyroid 08/03/21: IMPRESSION: - Nonspecific thyroid heterogeneity suggesting chronic medical  thyroid disease. - 0.7 cm left inferior TR 4 nodule does not meet criteria for continued follow-up or biopsy. - No other significant finding by ultrasound. - The above is in keeping with the ACR TI-RADS recommendations - J Am Coll Radiol 2017;14:587-595.    EKG: 06/09/21: NSR, poor r wave progression.    CV: N/A  Past Medical History:  Diagnosis Date   Arthritis    back   COPD (chronic obstructive pulmonary disease) (HCC)    Diabetes mellitus without complication (HCC)    no medications , monitors cbg at home with monitor, avg no more than 150s at home    Dyspnea    increased exertion; pt states can climb flight of stairs w/o having to stop prior to reaching top ; ongoing reports it is due to "having one lung"    GERD (gastroesophageal reflux disease)    Hepatitis    hepatitis C; pt states competed treatments, unsure what medication he was treated with    History of chemotherapy    Hypertension    reports its been over a year since he stopped taking bp medication, reports he made her medical doctor aware that he was topping it    lung ca dx'd 12/2007   lt lobectomy   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   and back - see note in [facility] 12/26/21   Numbness    FEET BILATERAL   Pneumonia 2009   Right upper lobe pulmonary nodule 04/29/2021   Rotator cuff tear    right     Past Surgical History:  Procedure Laterality Date   ARTHROSCOPIC REPAIR ACL Right    BRONCHIAL BIOPSY  06/09/2021   Procedure: BRONCHIAL BIOPSIES;  Surgeon: Garner Nash, DO;  Location: [facility];  Service: Pulmonary;;   BRONCHIAL BRUSHINGS  06/09/2021   Procedure: BRONCHIAL BRUSHINGS;  Surgeon: Garner Nash, DO;  Location: [facility];  Service: Pulmonary;;   BRONCHIAL WASHINGS  06/09/2021   Procedure: BRONCHIAL WASHINGS;  Surgeon: Garner Nash, DO;  Location: [facility];  Service: Pulmonary;;   CATARACT EXTRACTION, BILATERAL  2019   DECOMPRESSIVE LUMBAR LAMINECTOMY LEVEL 1 N/A 03/24/2016    Procedure: L3-4 CENTRAL DECOMPRESSION LUMBER LAMINECTOMY;  Surgeon: Latanya Maudlin, MD;  Location: [facility];  Service: Orthopedics;  Laterality: N/A;   FOOT SURGERY Bilateral    HAND SURGERY Bilateral    LOBECTOMY Left 2009   pneumonectomy Left 2009   [name]   SHOULDER ARTHROSCOPY WITH DISTAL CLAVICLE RESECTION Right 12/14/2018   Procedure: SHOULDER ARTHROSCOPY WITH DISTAL CLAVICLE RESECTION;  Surgeon: Tania Ade, MD;  Location: [facility];  Service: Orthopedics;  Laterality: Right;   SHOULDER ARTHROSCOPY WITH ROTATOR CUFF REPAIR Right 12/14/2018   Procedure: SHOULDER ARTHROSCOPY WITH ROTATOR CUFF REPAIR;  Surgeon: Tania Ade, MD;  Location: [facility];  Service: Orthopedics;  Laterality: Right;   SHOULDER ARTHROSCOPY WITH SUBACROMIAL DECOMPRESSION Right 12/14/2018   Procedure: SHOULDER ARTHROSCOPY WITH SUBACROMIAL DECOMPRESSION;  Surgeon: Tania Ade, MD;  Location: [facility];  Service: Orthopedics;  Laterality: Right;   TRIGGER FINGER RELEASE     right hand, pointer finger    VIDEO BRONCHOSCOPY WITH RADIAL ENDOBRONCHIAL ULTRASOUND  06/09/2021   Procedure: VIDEO BRONCHOSCOPY WITH RADIAL ENDOBRONCHIAL ULTRASOUND;  Surgeon: Garner Nash, DO;  Location: [facility];  Service: Pulmonary;;    MEDICATIONS: No  current facility-administered medications for this encounter.    ALPRAZolam (XANAX) 1 MG tablet   Ascorbic Acid (VITAMIN C) 1000 MG tablet   aspirin EC 81 MG tablet   Aspirin-Caffeine (BC FAST PAIN RELIEF) 845-65 MG PACK   atorvastatin (LIPITOR) 10 MG tablet   B Complex-C (SUPER B COMPLEX PO)   esomeprazole (NEXIUM) 40 MG capsule   guaiFENesin (MUCINEX) 600 MG 12 hr tablet   ipratropium-albuterol (DUONEB) 0.5-2.5 (3) MG/3ML SOLN   meloxicam (MOBIC) 15 MG tablet   methocarbamol (ROBAXIN) 500 MG tablet   Omega-3 Fatty Acids ([employer] FISH OIL) 1000 MG CAPS   oxyCODONE-acetaminophen (PERCOCET) 10-325 MG tablet   pregabalin (LYRICA) 75 MG capsule   vitamin B-12 (CYANOCOBALAMIN)  1000 MCG tablet    Myra Gianotti, PA-C Surgical Short Stay/Anesthesiology [facility] Phone [phone] [facility] Phone [phone] 03/22/2022 12:26 PM

## 2022-03-22 NOTE — Progress Notes (Signed)
PCP - Dr [name] Cardiologist - n/a  CT Chest x-ray - 03/17/22 EKG - 06/09/21 Stress Test - n/a ECHO - n/a Cardiac Cath - n/a  ICD Pacemaker/Loop - n/a  Sleep Study -  n/a CPAP - none  Diabetes Type 2 - no meds  Aspirin Instructions: Follow your surgeon's instructions on when to stop aspirin prior to surgery,  If no instructions were given by your surgeon then you will need to call the office for those instructions.  Anesthesia review: Yes  STOP now taking any Aspirin (unless otherwise instructed by your surgeon), Mobic, Aleve, Naproxen, Ibuprofen, Motrin, Advil, Goody's, BC's, all herbal medications, fish oil, and all vitamins.   Coronavirus Screening Covid test on 03/19/22 was negative. Does the patient have any of the following symptoms:  Cough yes/no: No Fever (>100.69F)  yes/no: No Runny nose yes/no: No Sore throat yes/no: No Difficulty breathing/shortness of breath  yes/no: Yes  Have you traveled in the last 14 days and where? yes/no: No  Patient's wife Vaughan Basta verbalized understanding of instructions that were given via phone.

## 2022-03-23 ENCOUNTER — Ambulatory Visit (HOSPITAL_BASED_OUTPATIENT_CLINIC_OR_DEPARTMENT_OTHER): Payer: Medicare Other | Admitting: Vascular Surgery

## 2022-03-23 ENCOUNTER — Telehealth: Payer: Self-pay | Admitting: Internal Medicine

## 2022-03-23 ENCOUNTER — Encounter (HOSPITAL_COMMUNITY): Payer: Self-pay | Admitting: Pulmonary Disease

## 2022-03-23 ENCOUNTER — Ambulatory Visit (HOSPITAL_COMMUNITY): Payer: Medicare Other | Admitting: Vascular Surgery

## 2022-03-23 ENCOUNTER — Encounter (HOSPITAL_COMMUNITY)
Admission: RE | Disposition: A | Discharge status: Home / Self Care | Payer: Self-pay | Source: Home / Self Care | Attending: Pulmonary Disease

## 2022-03-23 ENCOUNTER — Other Ambulatory Visit: Payer: Self-pay

## 2022-03-23 ENCOUNTER — Ambulatory Visit (HOSPITAL_COMMUNITY)
Admission: RE | Admit: 2022-03-23 | Discharge: 2022-03-23 | Disposition: A | Discharge status: Home / Self Care | Payer: Medicare Other | Attending: Pulmonary Disease | Admitting: Pulmonary Disease

## 2022-03-23 DIAGNOSIS — Z902 Acquired absence of lung [part of]: Secondary | ICD-10-CM | POA: Diagnosis not present

## 2022-03-23 DIAGNOSIS — C771 Secondary and unspecified malignant neoplasm of intrathoracic lymph nodes: Secondary | ICD-10-CM | POA: Diagnosis not present

## 2022-03-23 DIAGNOSIS — R590 Localized enlarged lymph nodes: Secondary | ICD-10-CM

## 2022-03-23 DIAGNOSIS — F17210 Nicotine dependence, cigarettes, uncomplicated: Secondary | ICD-10-CM

## 2022-03-23 DIAGNOSIS — I10 Essential (primary) hypertension: Secondary | ICD-10-CM

## 2022-03-23 DIAGNOSIS — R599 Enlarged lymph nodes, unspecified: Secondary | ICD-10-CM | POA: Diagnosis not present

## 2022-03-23 DIAGNOSIS — Z9221 Personal history of antineoplastic chemotherapy: Secondary | ICD-10-CM | POA: Insufficient documentation

## 2022-03-23 DIAGNOSIS — Z85118 Personal history of other malignant neoplasm of bronchus and lung: Secondary | ICD-10-CM | POA: Insufficient documentation

## 2022-03-23 DIAGNOSIS — R911 Solitary pulmonary nodule: Secondary | ICD-10-CM | POA: Diagnosis present

## 2022-03-23 DIAGNOSIS — E119 Type 2 diabetes mellitus without complications: Secondary | ICD-10-CM | POA: Diagnosis not present

## 2022-03-23 DIAGNOSIS — J449 Chronic obstructive pulmonary disease, unspecified: Secondary | ICD-10-CM

## 2022-03-23 HISTORY — PX: VIDEO BRONCHOSCOPY WITH ENDOBRONCHIAL ULTRASOUND: SHX6177

## 2022-03-23 HISTORY — PX: FINE NEEDLE ASPIRATION: SHX5430

## 2022-03-23 LAB — COMPREHENSIVE METABOLIC PANEL
ALT: 21 U/L (ref 0–44)
AST: 32 U/L (ref 15–41)
Albumin: 3.7 g/dL (ref 3.5–5.0)
Alkaline Phosphatase: 44 U/L (ref 38–126)
Anion gap: 14 (ref 5–15)
BUN: 14 mg/dL (ref 8–23)
CO2: 26 mmol/L (ref 22–32)
Calcium: 9.8 mg/dL (ref 8.9–10.3)
Chloride: 96 mmol/L — ABNORMAL LOW (ref 98–111)
Creatinine, Ser: 1.06 mg/dL (ref 0.61–1.24)
GFR, Estimated: >60 mL/min (ref >60)
Glucose, Bld: 112 mg/dL — ABNORMAL HIGH (ref 70–99)
Potassium: 4.2 mmol/L (ref 3.5–5.1)
Sodium: 136 mmol/L (ref 135–145)
Total Bilirubin: 0.9 mg/dL (ref 0.3–1.2)
Total Protein: 6.4 g/dL — ABNORMAL LOW (ref 6.5–8.1)

## 2022-03-23 LAB — CBC
HCT: 41.1 % (ref 39.0–52.0)
Hemoglobin: 14.5 g/dL (ref 13.0–17.0)
MCH: 34.9 pg — ABNORMAL HIGH (ref 26.0–34.0)
MCHC: 35.3 g/dL (ref 30.0–36.0)
MCV: 98.8 fL (ref 80.0–100.0)
Platelets: 356 10*3/uL (ref 150–400)
RBC: 4.16 MIL/uL — ABNORMAL LOW (ref 4.22–5.81)
RDW: 11 % — ABNORMAL LOW (ref 11.5–15.5)
WBC: 10.7 10*3/uL — ABNORMAL HIGH (ref 4.0–10.5)
nRBC: 0 % (ref 0.0–0.2)

## 2022-03-23 LAB — GLUCOSE, CAPILLARY
Glucose-Capillary: 109 mg/dL — ABNORMAL HIGH (ref 70–99)
Glucose-Capillary: 123 mg/dL — ABNORMAL HIGH (ref 70–99)

## 2022-03-23 SURGERY — BRONCHOSCOPY, WITH EBUS
Anesthesia: General | Laterality: Right

## 2022-03-23 MED ORDER — PROPOFOL 10 MG/ML IV BOLUS
INTRAVENOUS | Status: DC | PRN
  Administered 2022-03-23: 150 mg via INTRAVENOUS

## 2022-03-23 MED ORDER — FENTANYL CITRATE (PF) 100 MCG/2ML IJ SOLN
INTRAMUSCULAR | Status: DC | PRN
  Administered 2022-03-23 (×2): 50 ug via INTRAVENOUS

## 2022-03-23 MED ORDER — CHLORHEXIDINE GLUCONATE 0.12 % MT SOLN
OROMUCOSAL | Status: AC
  Administered 2022-03-23: 15 mL via OROMUCOSAL
  Filled 2022-03-23: qty 15

## 2022-03-23 MED ORDER — SUGAMMADEX SODIUM 200 MG/2ML IV SOLN
INTRAVENOUS | Status: DC | PRN
  Administered 2022-03-23: 200 mg via INTRAVENOUS

## 2022-03-23 MED ORDER — LIDOCAINE 2% (20 MG/ML) 5 ML SYRINGE
INTRAMUSCULAR | Status: DC | PRN
  Administered 2022-03-23: 100 mg via INTRAVENOUS

## 2022-03-23 MED ORDER — ALBUTEROL SULFATE (2.5 MG/3ML) 0.083% IN NEBU
INHALATION_SOLUTION | RESPIRATORY_TRACT | Status: AC
  Filled 2022-03-23: qty 3

## 2022-03-23 MED ORDER — ROCURONIUM BROMIDE 10 MG/ML (PF) SYRINGE
PREFILLED_SYRINGE | INTRAVENOUS | Status: DC | PRN
  Administered 2022-03-23: 60 mg via INTRAVENOUS

## 2022-03-23 MED ORDER — DEXAMETHASONE SODIUM PHOSPHATE 10 MG/ML IJ SOLN
INTRAMUSCULAR | Status: DC | PRN
  Administered 2022-03-23: 10 mg via INTRAVENOUS

## 2022-03-23 MED ORDER — CHLORHEXIDINE GLUCONATE 0.12 % MT SOLN
15.0000 mL | Freq: Once | OROMUCOSAL | Status: AC
  Filled 2022-03-23: qty 15

## 2022-03-23 MED ORDER — FENTANYL CITRATE (PF) 250 MCG/5ML IJ SOLN: INTRAMUSCULAR | Status: DC | PRN

## 2022-03-23 MED ORDER — INSULIN ASPART 100 UNIT/ML IJ SOLN: 0.0000 [IU] | INTRAMUSCULAR | Status: DC | PRN

## 2022-03-23 MED ORDER — MIDAZOLAM HCL 2 MG/2ML IJ SOLN
INTRAMUSCULAR | Status: DC | PRN
  Administered 2022-03-23 (×2): 1 mg via INTRAVENOUS

## 2022-03-23 MED ORDER — ONDANSETRON HCL 4 MG/2ML IJ SOLN
INTRAMUSCULAR | Status: DC | PRN
  Administered 2022-03-23: 4 mg via INTRAVENOUS

## 2022-03-23 MED ORDER — LACTATED RINGERS IV SOLN: INTRAVENOUS | Status: DC

## 2022-03-23 MED ORDER — PROPOFOL 500 MG/50ML IV EMUL
INTRAVENOUS | Status: DC | PRN
  Administered 2022-03-23: 150 ug/kg/min via INTRAVENOUS

## 2022-03-23 SURGICAL SUPPLY — 30 items
BRUSH CYTOL CELLEBRITY 1.5X140 (MISCELLANEOUS) IMPLANT
CANISTER SUCT 3000ML PPV (MISCELLANEOUS) ×3 IMPLANT
CONT SPEC 4OZ CLIKSEAL STRL BL (MISCELLANEOUS) ×3 IMPLANT
COVER BACK TABLE 60X90IN (DRAPES) ×3 IMPLANT
COVER DOME SNAP 22 D (MISCELLANEOUS) ×3 IMPLANT
FORCEPS BIOP RJ4 1.8 (CUTTING FORCEPS) IMPLANT
GAUZE SPONGE 4X4 12PLY STRL (GAUZE/BANDAGES/DRESSINGS) ×3 IMPLANT
GLOVE BIO SURGEON STRL SZ7.5 (GLOVE) ×3 IMPLANT
GOWN STRL REUS W/ TWL LRG LVL3 (GOWN DISPOSABLE) ×3 IMPLANT
GOWN STRL REUS W/TWL LRG LVL3 (GOWN DISPOSABLE) ×2
KIT CLEAN ENDO COMPLIANCE (KITS) ×6 IMPLANT
KIT TURNOVER KIT B (KITS) ×3 IMPLANT
MARKER SKIN DUAL TIP RULER LAB (MISCELLANEOUS) ×3 IMPLANT
NDL EBUS SONO TIP PENTAX (NEEDLE) ×3 IMPLANT
NEEDLE EBUS SONO TIP PENTAX (NEEDLE) ×2 IMPLANT
NS IRRIG 1000ML POUR BTL (IV SOLUTION) ×3 IMPLANT
OIL SILICONE PENTAX (PARTS (SERVICE/REPAIRS)) ×3 IMPLANT
PAD ARMBOARD 7.5X6 YLW CONV (MISCELLANEOUS) ×6 IMPLANT
SOL ANTI FOG 6CC (MISCELLANEOUS) ×3 IMPLANT
SOLUTION ANTI FOG 6CC (MISCELLANEOUS) ×2
SYR 20CC LL (SYRINGE) ×6 IMPLANT
SYR 20ML ECCENTRIC (SYRINGE) ×6 IMPLANT
SYR 50ML SLIP (SYRINGE) IMPLANT
SYR 5ML LUER SLIP (SYRINGE) ×3 IMPLANT
TOWEL OR 17X24 6PK STRL BLUE (TOWEL DISPOSABLE) ×3 IMPLANT
TRAP SPECIMEN MUCOUS 40CC (MISCELLANEOUS) IMPLANT
TUBE CONNECTING 20X1/4 (TUBING) ×6 IMPLANT
UNDERPAD 30X30 (UNDERPADS AND DIAPERS) ×3 IMPLANT
VALVE DISPOSABLE (MISCELLANEOUS) ×3 IMPLANT
WATER STERILE IRR 1000ML POUR (IV SOLUTION) ×3 IMPLANT

## 2022-03-23 NOTE — Telephone Encounter (Signed)
Scheduled appt per 10/17 referral. Pt is aware of appt date and time. Pt is aware to arrive 15 mins prior to appt time and to bring and updated insurance card. Pt is aware of appt location.

## 2022-03-23 NOTE — Transfer of Care (Signed)
Immediate Anesthesia Transfer of Care Note  Patient: Benjamin Case  Procedure(s) Performed: VIDEO BRONCHOSCOPY WITH ENDOBRONCHIAL ULTRASOUND (Right) FINE NEEDLE ASPIRATION (FNA) LINEAR  Patient Location: PACU  Anesthesia Type:General  Level of Consciousness: awake, alert  and oriented  Airway & Oxygen Therapy: Patient Spontanous Breathing  Post-op Assessment: Report given to RN and Post -op Vital signs reviewed and stable  Post vital signs: Reviewed and stable  Last Vitals:  Vitals Value Taken Time  BP 127/77 03/23/22 1018  Temp    Pulse 92 03/23/22 1019  Resp 21 03/23/22 1019  SpO2 95 % 03/23/22 1019  Vitals shown include unvalidated device data.  Last Pain:  Vitals:   03/23/22 0729  TempSrc: Oral  PainSc: 0-No pain         Complications: No notable events documented.

## 2022-03-23 NOTE — Op Note (Signed)
Video Bronchoscopy with Endobronchial Ultrasound Procedure Note  Date of Operation: 03/23/2022  Pre-op Diagnosis: Lung nodule, adenopathy   Post-op Diagnosis: Lung nodule, adenopathy   Surgeon: Garner Nash, DO  Assistants: None   Anesthesia: General endotracheal anesthesia  Operation: Flexible video fiberoptic bronchoscopy with endobronchial ultrasound and biopsies.  Estimated Blood Loss: Minimal  Complications: None   Indications and History: Benjamin Case is a 66 y.o. male with lung nodule, adenopathy.  The risks, benefits, complications, treatment options and expected outcomes were discussed with the patient.  The possibilities of pneumothorax, pneumonia, reaction to medication, pulmonary aspiration, perforation of a viscus, bleeding, failure to diagnose a condition and creating a complication requiring transfusion or operation were discussed with the patient who freely signed the consent.    Description of Procedure: The patient was examined in the preoperative area and history and data from the preprocedure consultation were reviewed. It was deemed appropriate to proceed.  The patient was taken to [facility], identified as [facility] and the procedure verified as Flexible Video Fiberoptic Bronchoscopy.  A Time Out was held and the above information confirmed. After being taken to the operating room general anesthesia was initiated and the patient  was orally intubated. The video fiberoptic bronchoscope was introduced via the endotracheal tube and a general inspection was performed which showed normal right lung anatomy, left mainstem stoma, visible suture line status post left pneumonectomy. The standard scope was then withdrawn and the endobronchial ultrasound was used to identify and characterize the peritracheal, hilar and bronchial lymph nodes. Inspection showed enlarged right hilar adenopathy consistent with a hypermetabolic lesion seen on pet imaging. Using real-time  ultrasound guidance Wang needle biopsies were take from Station 10 R nodes and were sent for cytology. The patient tolerated the procedure well without apparent complications. There was no significant blood loss. The bronchoscope was withdrawn. Anesthesia was reversed and the patient was taken to the PACU for recovery.   Samples: 1. Wang needle biopsies from 10R node  Plans:  The patient will be discharged from the PACU to home when recovered from anesthesia. We will review the cytology, pathology results with the patient when they become available. Outpatient followup will be with Eric Form, NP.   Garner Nash, DO [facility] 03/23/2022 10:24 AM

## 2022-03-23 NOTE — Discharge Instructions (Signed)
Flexible Bronchoscopy, Care After This sheet gives you information about how to care for yourself after your test. Your doctor may also give you more specific instructions. If you have problems or questions, contact your doctor. Follow these instructions at home: Eating and drinking Do not eat or drink anything (not even water) for 2 hours after your test, or until your numbing medicine (local anesthetic) wears off. When your numbness is gone and your cough and gag reflexes have come back, you may: Eat only soft foods. Slowly drink liquids. The day after the test, go back to your normal diet. Driving Do not drive for 24 hours if you were given a medicine to help you relax (sedative). Do not drive or use heavy machinery while taking prescription pain medicine. General instructions  Take over-the-counter and prescription medicines only as told by your doctor. Return to your normal activities as told. Ask what activities are safe for you. Do not use any products that have nicotine or tobacco in them. This includes cigarettes and e-cigarettes. If you need help quitting, ask your doctor. Keep all follow-up visits as told by your doctor. This is important. It is very important if you had a tissue sample (biopsy) taken. Get help right away if: You have shortness of breath that gets worse. You get light-headed. You feel like you are going to pass out (faint). You have chest pain. You cough up: More than a little blood. More blood than before. Summary Do not eat or drink anything (not even water) for 2 hours after your test, or until your numbing medicine wears off. Do not use cigarettes. Do not use e-cigarettes. Get help right away if you have chest pain.  This information is not intended to replace advice given to you by your health care provider. Make sure you discuss any questions you have with your health care provider. Document Released: 03/21/2009 Document Revised: 05/06/2017 Document  Reviewed: 06/11/2016 Elsevier Patient Education  2020 [employer].

## 2022-03-23 NOTE — Anesthesia Procedure Notes (Signed)
Procedure Name: Intubation Date/Time: 03/23/2022 9:35 AM  Performed by: Myrtie Soman, MDPre-anesthesia Checklist: Patient identified, Emergency Drugs available, Suction available and Patient being monitored Patient Re-evaluated:Patient Re-evaluated prior to induction Oxygen Delivery Method: Circle system utilized Preoxygenation: Pre-oxygenation with 100% oxygen Induction Type: IV induction Ventilation: Mask ventilation without difficulty and Oral airway inserted - appropriate to patient size Laryngoscope Size: Mac and 4 Grade View: Grade II Tube type: Oral Tube size: 8.5 mm Number of attempts: 1 Airway Equipment and Method: Stylet and Oral airway Placement Confirmation: ETT inserted through vocal cords under direct vision, positive ETCO2 and breath sounds checked- equal and bilateral Secured at: 23 cm Tube secured with: Tape Dental Injury: Teeth and Oropharynx as per pre-operative assessment

## 2022-03-23 NOTE — Anesthesia Postprocedure Evaluation (Signed)
Anesthesia Post Note  Patient: Benjamin Case  Procedure(s) Performed: VIDEO BRONCHOSCOPY WITH ENDOBRONCHIAL ULTRASOUND (Right) FINE NEEDLE ASPIRATION (FNA) LINEAR     Patient location during evaluation: PACU Anesthesia Type: General Level of consciousness: awake and alert Pain management: pain level controlled Vital Signs Assessment: post-procedure vital signs reviewed and stable Respiratory status: spontaneous breathing, nonlabored ventilation, respiratory function stable and patient connected to nasal cannula oxygen Cardiovascular status: blood pressure returned to baseline and stable Postop Assessment: no apparent nausea or vomiting Anesthetic complications: no   No notable events documented.  Last Vitals:  Vitals:   03/23/22 0729 03/23/22 1018  BP: (!) 143/69 127/77  Pulse: 72 87  Resp: 20 (!) 21  Temp: 36.6 C 36.8 C  SpO2: 94% 93%    Last Pain:  Vitals:   03/23/22 1018  TempSrc:   PainSc: 0-No pain                 [name]

## 2022-03-23 NOTE — Interval H&P Note (Signed)
History and Physical Interval Note:  03/23/2022 7:43 AM  Benjamin Case  has presented today for surgery, with the diagnosis of Lung nodule.  The various methods of treatment have been discussed with the patient and family. After consideration of risks, benefits and other options for treatment, the patient has consented to  Procedure(s): ROBOTIC ASSISTED NAVIGATIONAL BRONCHOSCOPY (Right) VIDEO BRONCHOSCOPY WITH ENDOBRONCHIAL ULTRASOUND (Right) as a surgical intervention.  The patient's history has been reviewed, patient examined, no change in status, stable for surgery.  I have reviewed the patient's chart and labs.  Questions were answered to the patient's satisfaction.     [place]

## 2022-03-28 ENCOUNTER — Encounter (HOSPITAL_COMMUNITY): Payer: Self-pay | Admitting: Pulmonary Disease

## 2022-03-29 LAB — CYTOLOGY - NON PAP

## 2022-03-30 ENCOUNTER — Ambulatory Visit (INDEPENDENT_AMBULATORY_CARE_PROVIDER_SITE_OTHER): Payer: Medicare Other | Admitting: Acute Care

## 2022-03-30 ENCOUNTER — Encounter: Payer: Self-pay | Admitting: Acute Care

## 2022-03-30 VITALS — BP 122/82 | HR 108 | Temp 97.8°F | Ht 70.0 in | Wt 178.8 lb

## 2022-03-30 DIAGNOSIS — J441 Chronic obstructive pulmonary disease with (acute) exacerbation: Secondary | ICD-10-CM | POA: Diagnosis not present

## 2022-03-30 DIAGNOSIS — Z85118 Personal history of other malignant neoplasm of bronchus and lung: Secondary | ICD-10-CM

## 2022-03-30 DIAGNOSIS — C3490 Malignant neoplasm of unspecified part of unspecified bronchus or lung: Secondary | ICD-10-CM

## 2022-03-30 DIAGNOSIS — Z9221 Personal history of antineoplastic chemotherapy: Secondary | ICD-10-CM | POA: Diagnosis not present

## 2022-03-30 NOTE — Patient Instructions (Addendum)
It is good to see you today  Your biopsies were positive for non small cell lung cancer . You will see [name] this Thursday to get started on your treatment plan.  Your appointment is 04/01/2022  We discussed treatment options may include radiation treatment and systemic chemotherapy.  1:45 for labs and 2:15 for Dr Julien Nordmann.  Follow up as needed.  Please contact office for sooner follow up if symptoms do not improve or worsen or seek emergency care

## 2022-03-30 NOTE — Progress Notes (Unsigned)
History of Present Illness Benjamin Case is a 66 y.o. male with ***   Synopsis 66 year old gentleman, past medical history of diabetes, hepatitis C, hypertension, history of left pneumonectomy by Dr. 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 Patient had a CT scan of the chest on 04/29/2021 by primary care.  This revealed a 1.1 posterior apical right upper lobe lung nodule concerning for primary bronchogenic carcinoma also there was a 6 cm apical right upper lobe nodule, and he was referred to Dr. Valeta Harms for follow up.   03/30/2022 Pt. Presents for follow up after Flexible video fiberoptic bronchoscopy with endobronchial ultrasound and biopsies on 03/23/2022. He did have a sore chest for several days after the procedure. He did mow 3 lawns 2 days after his biopsy, then was very sore after. His biopsy resulted as + for non small cell carcinoma. He has follow up with Dr. Earlie Server scheduled for 04/01/2022. He is coughing up secretions in the morning. They are thick. He is taking Mucinex 1200 mg every 12 hours. He is doing Duonebs twice daily. He does not have a maintenance inhaler.  He denies any fever, temp today in the office was 97.8.   FINAL MICROSCOPIC DIAGNOSIS:  A. LYMPH NODE, 10R, FINE NEEDLE ASPIRATION:  - Malignant cells consistent with non-small cell carcinoma (see comment)   COMMENT:   Morphologic evaluation of the lymph node reveals clusters of highly  atypical cells with moderate cytoplasm, nuclear pleomorphism, high  nuclear to cytoplasmic ratio and prominent nucleoli.  Appropriately  controlled immunohistochemical stains reveal the cells are positive for  CK7 and p40, and negative for TTF-1, Napsin A and CK20. The findings are  compatible with involvement by the patient's known squamous cell  carcinoma.    SPECIMEN ADEQUACY:   Test Results: IMPRESSION: 1. Hypermetabolic mediastinal and  right hilar lymph nodes, consistent with metastatic adenopathy. 2. Subcarinal lymph node is increased in size compared with prior exam and demonstrates mild FDG uptake, concerning for additional site of nodal metastatic disease. 3. Mildly hypermetabolic solid pulmonary nodules of the right upper lobe, concerning for primary lung malignancy or metastatic disease. 4. Numerous hypermetabolic osseous lesions involving the manubrium, right scapula, thoracolumbar spine, bilateral ribs, and pelvis. 5. Scattered areas of focal hypermetabolic activity seen within the musculature of the bilateral buttocks, left psoas muscle and left cervical paraspinal musculature with no definite correlate seen on CT, concerning for soft tissue metastatic disease. 6. Aortic Atherosclerosis (ICD10-I70.0) and Emphysema (ICD10-J43.9).       Latest Ref Rng & Units 03/23/2022    8:12 AM 06/09/2021    5:48 AM 12/11/2018   11:22 AM  CBC  WBC 4.0 - 10.5 K/uL 10.7  7.8  8.2   Hemoglobin 13.0 - 17.0 g/dL 14.5  15.5  14.2   Hematocrit 39.0 - 52.0 % 41.1  44.2  41.6   Platelets 150 - 400 K/uL 356  259  260        Latest Ref Rng & Units 03/23/2022    8:12 AM 06/09/2021    5:48 AM 12/11/2018   11:22 AM  BMP  Glucose 70 - 99 mg/dL 112  102  148   BUN 8 - 23 mg/dL 14  12  17    Creatinine 0.61 - 1.24 mg/dL 1.06  1.10  1.22   Sodium 135 - 145 mmol/L 136  135  132   Potassium 3.5 - 5.1  mmol/L 4.2  3.6  4.6   Chloride 98 - 111 mmol/L 96  97  97   CO2 22 - 32 mmol/L 26  27  27    Calcium 8.9 - 10.3 mg/dL 9.8  9.0  9.3     BNP No results found for: "BNP"  ProBNP    Component Value Date/Time   PROBNP 377.0 (H) 01/03/2008 0545    PFT No results found for: "FEV1PRE", "FEV1POST", "FVCPRE", "FVCPOST", "TLC", "DLCOUNC", "PREFEV1FVCRT", "PSTFEV1FVCRT"  CT Super D Chest Wo Contrast  Result Date: 03/18/2022 CLINICAL DATA:  Right upper lobe pulmonary nodule. History of left lung cancer status post left pneumonectomy.  Findings concerning for metastatic disease on recent PET-CT. * Tracking Code: BO * EXAM: CT CHEST WITHOUT CONTRAST TECHNIQUE: Multidetector CT imaging of the chest was performed using thin slice collimation for electromagnetic bronchoscopy planning purposes, without intravenous contrast. RADIATION DOSE REDUCTION: This exam was performed according to the departmental dose-optimization program which includes automated exposure control, adjustment of the mA and/or kV according to patient size and/or use of iterative reconstruction technique. COMPARISON:  PET-CT 03/12/2022.  Chest CT 12/15/2021 and 04/29/2021. FINDINGS: Cardiovascular: Atherosclerosis of the aorta, great vessels and coronary arteries. Chronic central enlargement of the pulmonary arteries consistent with pulmonary arterial hypertension. The heart size is normal. There is no pericardial effusion. Mediastinum/Nodes: A centrally necrotic precarinal mass has significantly enlarged from the previous chest CT, measuring 3.6 x 3.0 cm on image 24/2. This previously measured 2.2 x 1.5 cm and was hypermetabolic on the recent PET-CT.A subcarinal node measuring up to 1.8 cm short axis on image 31/2 has also enlarged from previous CT, although do not show significant hypermetabolic activity on the PET-CT. No axillary adenopathy. Stable volume loss in the left hemithorax with mediastinal shift to the left. The thyroid gland and esophagus appear unremarkable. Lungs/Pleura: Status post left pneumonectomy with chronic fibrothorax. No significant right pleural effusion. There are underlying emphysematous changes throughout the right lung with interval increased diffuse central airway thickening. Cavitary right upper lobe nodule measuring 2.1 x 1.1 cm on image 45/7 has mildly enlarged from the previous CT. There is an enlarging and increasingly solid nodule medially in the right upper lobe measuring 1.0 x 0.7 cm on image 33/7. Both of these nodules were mildly  hypermetabolic on PET-CT. Upper abdomen: Ill-defined low-density is noted anteriorly in the right hepatic lobe measuring 10 mm on image 62/2. This was not definitely seen previously, but there was no focal hypermetabolic activity in this region on recent PET-CT. The visualized upper abdomen otherwise appears stable. There is aortic and branch vessel atherosclerosis. Musculoskeletal/Chest wall: As correlated with recent PET-CT, evidence of metastatic disease within the sternal manubrium and T3 vertebral body. No pathologic fracture identified. Old rib fractures/thoracotomy defects bilaterally. IMPRESSION: 1. Centrally necrotic precarinal lymph node has significantly enlarged from the previous chest CT and was hypermetabolic on recent PET-CT, consistent with metastatic disease. 2. Enlarging and increasingly solid/cavitary right upper lobe pulmonary nodules, favoring primary lung cancer, although potentially metastases. 3. Subtle osseous metastases as correlated with PET-CT. 4. No recurrence identified in the left hemithorax post pneumonectomy. 5.  Aortic Atherosclerosis (ICD10-I70.0). Electronically Signed   By: Richardean Sale M.D.   On: 03/18/2022 16:44   NM PET Image Initial (PI) Skull Base To Thigh (F-18 FDG)  Result Date: 03/15/2022 CLINICAL DATA:  Follow-up treatment strategy for lung cancer. EXAM: NUCLEAR MEDICINE PET SKULL BASE TO THIGH TECHNIQUE: 8.9 mCi F-18 FDG was injected intravenously. Full-ring PET imaging was  performed from the skull base to thigh after the radiotracer. CT data was obtained and used for attenuation correction and anatomic localization. Fasting blood glucose: 114 mg/dl COMPARISON:  Chest CT dated December 15, 2021; PET-CT dated December 7th 2022 FINDINGS: Mediastinal blood pool activity: SUV max 3.2 Liver activity: SUV max 3.5 NECK: Focal hypermetabolic uptake in the left paraspinous musculature with SUV max of 3.6 on fused image 40. No hypermetabolic lymph nodes in the neck. Incidental  CT findings: None. CHEST: Hypermetabolic mediastinal right hilar lymph nodes. Reference right lower paratracheal lymph node measuring 1.5 cm in short axis on series 4, image 73 with SUV max of 5.4, unchanged in size. Reference right hilar lymph node measuring 1.0 cm in short axis on series 4, image 73 with SUV max 5.6, unchanged in size. Subcarinal lymph node measuring 1.8 cm in short axis on series 4, image 82, previously 0.9 cm with SUV max of 3.1. Solid pulmonary nodules of the right upper lobe measuring 9 mm on series 4, image 62 with SUV max of 2.4 and 9 mm on image 68 with SUV max of 2.3, unchanged in size when compared with prior chest CT and remeasured in similar plane. Incidental CT findings: Severe three-vessel coronary artery calcifications. Caliber thoracic aorta with moderate atherosclerotic disease. Stable postsurgical changes of left lobectomy. Mild centrilobular emphysema. ABDOMEN/PELVIS: Hypermetabolic lymph node located posterior to the left psoas muscle measuring 0.6 cm on series 4, image 162 with SUV max of 2.9. scattered areas of hypermetabolic uptake seen within the tissues no definite Incidental CT findings: Unchanged left adrenal gland adenoma, no specific follow-up imaging is recommended for this lesion. Nonobstructing left renal stone. Severe atherosclerotic disease of the abdominal aorta. Prostatomegaly. Simple appearing cyst of the left kidney, no specific follow-up imaging is recommended for this lesion. Mild diverticulosis. Scattered areas of focal hypermetabolic activity seen within the musculature of the bilateral buttocks, reference lesion of the right buttocks located on MPR fusion image 178 with SUV max of 2.9. Hypermetabolic uptake of the left iliopsoas muscle, reference lesion with SUV max 2.9 on fused image 162. SKELETON: Numerous hypermetabolic osseous lesions involving the manubrium, right scapula, thoracolumbar spine, bilateral ribs, and pelvis. Reference lesion of the  manubrium located on series 4, image 61 with SUV max of 6.5 and associated lytic lesion on CT. Reference lesion of the lateral right third rib with SUV max 5.4, no definite CT correlate. Reference lesion of the L3 vertebral body SUV max of 6.5, subtle associated lucent lesion seen on CT. Reference lesion of the left iliac bone located adjacent to the SI joint with SUV max of 5.9 subtle associated lucency seen on CT, series 4, image 166. Incidental CT findings: None. IMPRESSION: 1. Hypermetabolic mediastinal and right hilar lymph nodes, consistent with metastatic adenopathy. 2. Subcarinal lymph node is increased in size compared with prior exam and demonstrates mild FDG uptake, concerning for additional site of nodal metastatic disease. 3. Mildly hypermetabolic solid pulmonary nodules of the right upper lobe, concerning for primary lung malignancy or metastatic disease. 4. Numerous hypermetabolic osseous lesions involving the manubrium, right scapula, thoracolumbar spine, bilateral ribs, and pelvis. 5. Scattered areas of focal hypermetabolic activity seen within the musculature of the bilateral buttocks, left psoas muscle and left cervical paraspinal musculature with no definite correlate seen on CT, concerning for soft tissue metastatic disease. 6. Aortic Atherosclerosis (ICD10-I70.0) and Emphysema (ICD10-J43.9). Electronically Signed   By: Yetta Glassman M.D.   On: 03/15/2022 09:17     Past  medical hx Past Medical History:  Diagnosis Date   Arthritis    back   COPD (chronic obstructive pulmonary disease) ([place])    Diabetes mellitus without complication (H[facility])    no medications , monitors cbg at home with monitor, avg no more than 150s at home    Dyspnea    increased exertion; pt states can climb flight of stairs w/o having to stop prior to reaching top ; ongoing reports it is due to "having one lung"    GERD (gastroesophageal reflux disease)    Hepatitis    hepatitis C; pt states competed treatments,  unsure what medication he was treated with    History of chemotherapy    Hypertension    reports its been over a year since he stopped taking bp medication, reports he made her medical doctor aware that he was topping it    lung ca dx'd 12/2007   lt lobectomy   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   and back - see note in [facility] 12/26/21   Numbness    FEET BILATERAL   Pneumonia 2009   Right upper lobe pulmonary nodule 04/29/2021   Rotator cuff tear    right      Social History   Tobacco Use   Smoking status: Every Day    Packs/day: 0.50    Years: 45.00    Total pack years: 22.50    Types: Cigarettes   Smokeless tobacco: Never   Tobacco comments:    08/04/16 3-6 cigs daily 02/22/2022 Tay    1/2 -3/4 ppd as of 03/22/22  Vaping Use   Vaping Use: Never used  Substance Use Topics   Alcohol use: Yes    Comment: whiskey 4 drinks/day   Drug use: No    Mr.Meckel reports that he has been smoking cigarettes. He has a 22.50 pack-year smoking history. He has never used smokeless tobacco. He reports current alcohol use. He reports that he does not use drugs.  Tobacco Cessation: Ready to quit: Not Answered Counseling given: Not Answered Tobacco comments: 08/04/16 3-6 cigs daily 02/22/2022 Tay 1/2 -3/4 ppd as of 03/22/22   Past surgical hx, Family hx, Social hx all reviewed.  Current Outpatient Medications on File Prior to Visit  Medication Sig   ALPRAZolam (XANAX) 1 MG tablet Take 1 mg by mouth 3 (three) times daily as needed for anxiety.   Ascorbic Acid (VITAMIN C) 1000 MG tablet Take 1,000 mg by mouth daily.   aspirin EC 81 MG tablet Take 81 mg by mouth daily.   Aspirin-Caffeine (BC FAST PAIN RELIEF) 845-65 MG PACK Take 1 packet by mouth daily as needed (pain.).   atorvastatin (LIPITOR) 10 MG tablet Take 10 mg by mouth at bedtime.    B Complex-C (SUPER B COMPLEX PO) Take 1 tablet by mouth daily.   esomeprazole (NEXIUM) 40 MG capsule Take 40 mg by mouth daily as needed (for heartburn).     guaiFENesin (MUCINEX) 600 MG 12 hr tablet Take 600 mg by mouth 2 (two) times daily.   ipratropium-albuterol (DUONEB) 0.5-2.5 (3) MG/3ML SOLN Take 3 mLs by nebulization 2 (two) times daily.   meloxicam (MOBIC) 15 MG tablet Take 15 mg by mouth daily.   methocarbamol (ROBAXIN) 500 MG tablet Take 500 mg by mouth 2 (two) times daily as needed for muscle spasms.   Omega-3 Fatty Acids ([employer] FISH OIL) 1000 MG CAPS Take 1,000 mg by mouth every evening.   oxyCODONE-acetaminophen (PERCOCET) 10-325 MG tablet Take 1 tablet  by mouth 5 (five) times daily as needed for pain.   pregabalin (LYRICA) 75 MG capsule Take 75 mg by mouth 2 (two) times daily.   vitamin B-12 (CYANOCOBALAMIN) 1000 MCG tablet Take 1,000 mcg by mouth daily.   No current facility-administered medications on file prior to visit.     No Known Allergies  Review Of Systems:  Constitutional:   No  weight loss, night sweats,  Fevers, chills, fatigue, or  lassitude.  HEENT:   No headaches,  Difficulty swallowing,  Tooth/dental problems, or  Sore throat,                No sneezing, itching, ear ache, nasal congestion, post nasal drip,   CV:  No chest pain,  Orthopnea, PND, swelling in lower extremities, anasarca, dizziness, palpitations, syncope.   GI  No heartburn, indigestion, abdominal pain, nausea, vomiting, diarrhea, change in bowel habits, loss of appetite, bloody stools.   Resp: No shortness of breath with exertion or at rest.  No excess mucus, no productive cough,  No non-productive cough,  No coughing up of blood.  No change in color of mucus.  No wheezing.  No chest wall deformity  Skin: no rash or lesions.  GU: no dysuria, change in color of urine, no urgency or frequency.  No flank pain, no hematuria   MS:  No joint pain or swelling.  No decreased range of motion.  No back pain.  Psych:  No change in mood or affect. No depression or anxiety.  No memory loss.   Vital Signs BP 122/82 (BP Location: Left Arm, Cuff Size:  Normal)   Pulse (!) 108   Temp 97.8 F (36.6 C) (Oral)   Ht 5\' 10"  (1.778 m)   Wt 178 lb 12.8 oz (81.1 kg)   SpO2 93%   BMI 25.66 kg/m    Physical Exam:  General- No distress,  A&Ox3 ENT: No sinus tenderness, TM clear, pale nasal mucosa, no oral exudate,no post nasal drip, no LAN Cardiac: S1, S2, regular rate and rhythm, no murmur Chest: No wheeze/ rales/ dullness; no accessory muscle use, no nasal flaring, no sternal retractions Abd.: Soft Non-tender Ext: No clubbing cyanosis, edema Neuro:  normal strength Skin: No rashes, warm and dry Psych: normal mood and behavior   Assessment/Plan  No problem-specific Assessment & Plan notes found for this encounter.  I spent 45  minutes dedicated to the care of this patient on the date of this encounter to include pre-visit review of records, face-to-face time with the patient discussing conditions above, post visit ordering of testing, clinical documentation with the electronic health record, making appropriate referrals as documented, and communicating necessary information to the patient's healthcare team.   Magdalen Spatz, NP 03/30/2022  11:19 AM

## 2022-03-31 ENCOUNTER — Encounter: Payer: Self-pay | Admitting: Acute Care

## 2022-04-01 ENCOUNTER — Other Ambulatory Visit: Payer: Self-pay | Admitting: Internal Medicine

## 2022-04-01 ENCOUNTER — Inpatient Hospital Stay: Payer: Medicare Other

## 2022-04-01 ENCOUNTER — Other Ambulatory Visit: Payer: Self-pay

## 2022-04-01 ENCOUNTER — Inpatient Hospital Stay: Payer: Medicare Other | Attending: Internal Medicine | Admitting: Internal Medicine

## 2022-04-01 VITALS — BP 141/86 | HR 89 | Temp 98.5°F | Resp 16 | Ht 70.0 in | Wt 176.4 lb

## 2022-04-01 DIAGNOSIS — C7951 Secondary malignant neoplasm of bone: Secondary | ICD-10-CM | POA: Insufficient documentation

## 2022-04-01 DIAGNOSIS — C3492 Malignant neoplasm of unspecified part of left bronchus or lung: Secondary | ICD-10-CM

## 2022-04-01 DIAGNOSIS — C3411 Malignant neoplasm of upper lobe, right bronchus or lung: Secondary | ICD-10-CM | POA: Insufficient documentation

## 2022-04-01 LAB — CBC WITH DIFFERENTIAL (CANCER CENTER ONLY)
Abs Immature Granulocytes: 0.03 10*3/uL (ref 0.00–0.07)
Basophils Absolute: 0.1 10*3/uL (ref 0.0–0.1)
Basophils Relative: 1 %
Eosinophils Absolute: 0.1 10*3/uL (ref 0.0–0.5)
Eosinophils Relative: 1 %
HCT: 42.6 % (ref 39.0–52.0)
Hemoglobin: 15.3 g/dL (ref 13.0–17.0)
Immature Granulocytes: 0 %
Lymphocytes Relative: 15 %
Lymphs Abs: 1.8 10*3/uL (ref 0.7–4.0)
MCH: 34.9 pg — ABNORMAL HIGH (ref 26.0–34.0)
MCHC: 35.9 g/dL (ref 30.0–36.0)
MCV: 97 fL (ref 80.0–100.0)
Monocytes Absolute: 1.3 10*3/uL — ABNORMAL HIGH (ref 0.1–1.0)
Monocytes Relative: 11 %
Neutro Abs: 8.4 10*3/uL — ABNORMAL HIGH (ref 1.7–7.7)
Neutrophils Relative %: 72 %
Platelet Count: 336 10*3/uL (ref 150–400)
RBC: 4.39 MIL/uL (ref 4.22–5.81)
RDW: 11.2 % — ABNORMAL LOW (ref 11.5–15.5)
WBC Count: 11.7 10*3/uL — ABNORMAL HIGH (ref 4.0–10.5)
nRBC: 0 % (ref 0.0–0.2)

## 2022-04-01 LAB — CMP (CANCER CENTER ONLY)
ALT: 24 U/L (ref 0–44)
AST: 26 U/L (ref 15–41)
Albumin: 4.4 g/dL (ref 3.5–5.0)
Alkaline Phosphatase: 56 U/L (ref 38–126)
Anion gap: 6 (ref 5–15)
BUN: 13 mg/dL (ref 8–23)
CO2: 33 mmol/L — ABNORMAL HIGH (ref 22–32)
Calcium: 10.2 mg/dL (ref 8.9–10.3)
Chloride: 96 mmol/L — ABNORMAL LOW (ref 98–111)
Creatinine: 1.13 mg/dL (ref 0.61–1.24)
GFR, Estimated: >60 mL/min (ref >60)
Glucose, Bld: 136 mg/dL — ABNORMAL HIGH (ref 70–99)
Potassium: 4.7 mmol/L (ref 3.5–5.1)
Sodium: 135 mmol/L (ref 135–145)
Total Bilirubin: 1 mg/dL (ref 0.3–1.2)
Total Protein: 7.3 g/dL (ref 6.5–8.1)

## 2022-04-01 MED ORDER — PROCHLORPERAZINE MALEATE 10 MG PO TABS: 10.0000 mg | ORAL_TABLET | Freq: Four times a day (QID) | ORAL | 0 refills | Status: DC | PRN

## 2022-04-01 NOTE — Progress Notes (Signed)
[place] [phone]   [phone]  CONSULT NOTE  REFERRING PHYSICIAN: Dr. Leory Plowman Icard  REASON FOR CONSULTATION:  66 years old white male recently diagnosed with recurrent lung cancer  HPI Benjamin Case is a 66 y.o. male with past medical history significant for stage IIIa (T2, N2, M0) non-small cell lung cancer, squamous cell carcinoma diagnosed in June 2009 status post left pneumonectomy with lymph node dissection on December 28, 2007 by Dr. Jearld Fenton followed by 4 cycles of adjuvant systemic chemotherapy with cisplatin and docetaxel completed November 2009.  The patient has been on observation since that time and he was discharged from the clinic in 2017 after almost 8 years of no evidence for disease recurrence.  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  He has a long history of smoking.  He was seen by his primary care physician in November 2022 and he had repeat CT scan of the chest on April 29, 2021 for evaluation of his cancer and it showed irregular solid 1.1 cm posterior apical right upper lobe pulmonary nodule that is new and worrisome for metachronous primary bronchogenic carcinoma.  There was a separate and distinct apical right upper lobe 0.6 cm nodule that is also new and no thoracic adenopathy or other potential findings for metastatic disease.  The patient had a PET scan on May 13, 2021 and the right upper lobe pulmonary nodule was not hypermetabolic but required surveillance.  There was no enlarged or hypermetabolic mediastinal or hilar lymph nodes.  He had bronchoscopy with navigational bronchoscopy under the care of Dr. Valeta Harms on July 06, 2021 and the biopsy as well as the brushing of the right upper lobe nodule was negative and showing benign reactive changes with no malignant cells identified.  The patient was followed by observation and repeat CT scan  of the chest with contrast on December 15, 2021 showed new right paratracheal adenopathy consistent with metastatic adenopathy.  There was cavitary enlarged right upper lobe pulmonary nodule highly concerning for bronchogenic carcinoma and a second smaller right upper lobe nodule that is indeterminate.  The patient had a PET scan on March 12, 2022 and that showed solid pulmonary nodules of the right upper lobe measuring 0.9 cm with SUV max of 2.4 and 0.9 cm with SUV max of 2.3 unchanged in size compared to the prior CT scan of the chest.  There was also hypermetabolic mediastinal and right hilar lymph nodes including right lower paratracheal lymph node measuring 1.5 cm in short axis with SUV max of 5.4, right hilar lymph node measuring 1.0 cm with SUV max of 5.6 and subcarinal lymph node measuring 1.8 cm with SUV max of 3.1.  The abdominal and pelvic portion showed hypermetabolic lymph node located posterior to the left psoas muscle measuring 0.6 cm with SUV max of 2.9.  There was also sclerotic areas of focal hypermetabolic activity seen within the musculature of the bilateral buttocks including right buttocks with SUV of 2.9 and hypermetabolic uptake in the left iliopsoas muscle with SUV of 2.9.  The skeleton showed numerous hypermetabolic osseous lesions involving the manubrium, right scapula, thoracolumbar spine, bilateral ribs and pelvis.  On March 23, 2022 the patient underwent video bronchoscopy with endobronchial ultrasound procedure by Dr. Valeta Harms. The final pathology ([id number]) showed malignant cells consistent with non-small cell carcinoma. Appropriately controlled mmunohistochemical stains reveal the cells are positive for CK7 and p40,  and negative for TTF-1, Napsin A and CK20. The findings are compatible with involvement by the patient's known squamous cell carcinoma.  The patient was referred to me today for evaluation and recommendation regarding treatment of his condition. When seen today he is  feeling fine except for the soreness and shortness of breath after the biopsy.  He also has cough productive of clear sputum with no significant hemoptysis.  He has no nausea, vomiting, diarrhea or constipation.  He lost around 20 pounds in the last 4 months.  He has no significant headache or visual changes. Family history significant for mother who died from lung cancer and she was my patient for several years.  Father is unknown. The patient is married and has 2 biological children and 3 stepchildren.  He is currently unemployed and used to work in [employer].  He has a history for smoking 1 pack/day for around 50 years and unfortunately continues to smoke.  He also drinks 2-3 mixed alcoholic drinks on daily basis.  He has no history of drug abuse.  HPI  Past Medical History:  Diagnosis Date   Arthritis    back   COPD (chronic obstructive pulmonary disease) ([place])    Diabetes mellitus without complication (HCC)    no medications , monitors cbg at home with monitor, avg no more than 150s at home    Dyspnea    increased exertion; pt states can climb flight of stairs w/o having to stop prior to reaching top ; ongoing reports it is due to "having one lung"    GERD (gastroesophageal reflux disease)    Hepatitis    hepatitis C; pt states competed treatments, unsure what medication he was treated with    History of chemotherapy    Hypertension    reports its been over a year since he stopped taking bp medication, reports he made her medical doctor aware that he was topping it    lung ca dx'd 12/2007   lt lobectomy   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   and back - see note in [facility] 12/26/21   Numbness    FEET BILATERAL   Pneumonia 2009   Right upper lobe pulmonary nodule 04/29/2021   Rotator cuff tear    right     Past Surgical History:  Procedure Laterality Date   ARTHROSCOPIC REPAIR ACL Right    BRONCHIAL BIOPSY  06/09/2021   Procedure: BRONCHIAL BIOPSIES;  Surgeon: Garner Nash, DO;  Location: [facility];  Service: Pulmonary;;   BRONCHIAL BRUSHINGS  06/09/2021   Procedure: BRONCHIAL BRUSHINGS;  Surgeon: Garner Nash, DO;  Location: [facility];  Service: Pulmonary;;   BRONCHIAL WASHINGS  06/09/2021   Procedure: BRONCHIAL WASHINGS;  Surgeon: Garner Nash, DO;  Location: [facility];  Service: Pulmonary;;   CATARACT EXTRACTION, BILATERAL  2019   DECOMPRESSIVE LUMBAR LAMINECTOMY LEVEL 1 N/A 03/24/2016   Procedure: L3-4 CENTRAL DECOMPRESSION LUMBER LAMINECTOMY;  Surgeon: Latanya Maudlin, MD;  Location: [facility];  Service: Orthopedics;  Laterality: N/A;   FINE NEEDLE ASPIRATION  03/23/2022   Procedure: FINE NEEDLE ASPIRATION (FNA) LINEAR;  Surgeon: Garner Nash, DO;  Location: [facility];  Service: Pulmonary;;   FOOT SURGERY Bilateral    HAND SURGERY Bilateral    LOBECTOMY Left 2009   pneumonectomy Left 2009   [name]   SHOULDER ARTHROSCOPY WITH DISTAL CLAVICLE RESECTION Right 12/14/2018   Procedure: SHOULDER ARTHROSCOPY WITH DISTAL CLAVICLE RESECTION;  Surgeon: Tania Ade, MD;  Location:  [facility];  Service: Orthopedics;  Laterality: Right;   SHOULDER ARTHROSCOPY WITH ROTATOR CUFF REPAIR Right 12/14/2018   Procedure: SHOULDER ARTHROSCOPY WITH ROTATOR CUFF REPAIR;  Surgeon: Tania Ade, MD;  Location: [facility];  Service: Orthopedics;  Laterality: Right;   SHOULDER ARTHROSCOPY WITH SUBACROMIAL DECOMPRESSION Right 12/14/2018   Procedure: SHOULDER ARTHROSCOPY WITH SUBACROMIAL DECOMPRESSION;  Surgeon: Tania Ade, MD;  Location: [facility];  Service: Orthopedics;  Laterality: Right;   TRIGGER FINGER RELEASE     right hand, pointer finger    VIDEO BRONCHOSCOPY WITH ENDOBRONCHIAL ULTRASOUND Right 03/23/2022   Procedure: VIDEO BRONCHOSCOPY WITH ENDOBRONCHIAL ULTRASOUND;  Surgeon: Garner Nash, DO;  Location: [facility] [facility];  Service: Pulmonary;  Laterality: Right;   VIDEO BRONCHOSCOPY WITH RADIAL ENDOBRONCHIAL ULTRASOUND  06/09/2021    Procedure: VIDEO BRONCHOSCOPY WITH RADIAL ENDOBRONCHIAL ULTRASOUND;  Surgeon: Garner Nash, DO;  Location: [facility];  Service: Pulmonary;;    Family History  Problem Relation Age of Onset   Cancer Mother     Social History Social History   Tobacco Use   Smoking status: Every Day    Packs/day: 0.50    Years: 45.00    Total pack years: 22.50    Types: Cigarettes   Smokeless tobacco: Never   Tobacco comments:    08/04/16 3-6 cigs daily 02/22/2022 [place]    1/2 -3/4 ppd as of 03/22/22  Vaping Use   Vaping Use: Never used  Substance Use Topics   Alcohol use: Yes    Comment: whiskey 4 drinks/day   Drug use: No    Allergies  Allergen Reactions   Doxepin Hcl Other (See Comments)    Current Outpatient Medications  Medication Sig Dispense Refill   ALPRAZolam (XANAX) 1 MG tablet Take 1 mg by mouth 3 (three) times daily as needed for anxiety.  5   Ascorbic Acid (VITAMIN C) 1000 MG tablet Take 1,000 mg by mouth daily.     aspirin EC 81 MG tablet Take 81 mg by mouth daily.     Aspirin-Caffeine (BC FAST PAIN RELIEF) 845-65 MG PACK Take 1 packet by mouth daily as needed (pain.).     atorvastatin (LIPITOR) 10 MG tablet Take 10 mg by mouth at bedtime.      B Complex-C (SUPER B COMPLEX PO) Take 1 tablet by mouth daily.     esomeprazole (NEXIUM) 40 MG capsule Take 40 mg by mouth daily as needed (for heartburn).      guaiFENesin (MUCINEX) 600 MG 12 hr tablet Take 600 mg by mouth 2 (two) times daily.     ipratropium-albuterol (DUONEB) 0.5-2.5 (3) MG/3ML SOLN Take 3 mLs by nebulization 2 (two) times daily.     meloxicam (MOBIC) 15 MG tablet Take 15 mg by mouth daily.     methocarbamol (ROBAXIN) 500 MG tablet Take 500 mg by mouth 2 (two) times daily as needed for muscle spasms.     Omega-3 Fatty Acids ([employer] FISH OIL) 1000 MG CAPS Take 1,000 mg by mouth every evening.     oxyCODONE-acetaminophen (PERCOCET) 10-325 MG tablet Take 1 tablet by mouth 5 (five) times daily as needed for pain.      pregabalin (LYRICA) 75 MG capsule Take 75 mg by mouth 2 (two) times daily.     vitamin B-12 (CYANOCOBALAMIN) 1000 MCG tablet Take 1,000 mcg by mouth daily.     No current facility-administered medications for this visit.    Review of Systems  Constitutional: positive for fatigue and weight loss Eyes: negative Ears, nose,  mouth, throat, and face: negative Respiratory: positive for cough, dyspnea on exertion, and pleurisy/chest pain Cardiovascular: negative Gastrointestinal: negative Genitourinary:negative Integument/breast: negative Hematologic/lymphatic: negative Musculoskeletal:negative Neurological: negative Behavioral/Psych: negative Endocrine: negative Allergic/Immunologic: negative  Physical Exam  AST:MHDQQ, healthy, no distress, and well nourished SKIN: skin color, texture, turgor are normal, no rashes or significant lesions HEAD: Normocephalic, No masses, lesions, tenderness or abnormalities EYES: normal, PERRLA, Conjunctiva are pink and non-injected EARS: External ears normal, Canals clear OROPHARYNX:no exudate, no erythema, and lips, buccal mucosa, and tongue normal  NECK: supple, no adenopathy, no JVD LYMPH:  no palpable lymphadenopathy, no hepatosplenomegaly LUNGS: coarse sounds heard, decreased breath sounds HEART: regular rate & rhythm, no murmurs, and no gallops ABDOMEN:abdomen soft, non-tender, normal bowel sounds, and no masses or organomegaly BACK: Back symmetric, no curvature., No CVA tenderness EXTREMITIES:no joint deformities, effusion, or inflammation, no edema  NEURO: alert & oriented x 3 with fluent speech, no focal motor/sensory deficits  PERFORMANCE STATUS: ECOG 1  LABORATORY DATA: Lab Results  Component Value Date   WBC 11.7 (H) 04/01/2022   HGB 15.3 04/01/2022   HCT 42.6 04/01/2022   MCV 97.0 04/01/2022   PLT 336 04/01/2022      Chemistry      Component Value Date/Time   NA 135 04/01/2022 1331   NA 138 04/26/2016 1205   K 4.7  04/01/2022 1331   K 5.0 04/26/2016 1205   CL 96 (L) 04/01/2022 1331   CL 103 04/27/2012 1253   CO2 33 (H) 04/01/2022 1331   CO2 30 (H) 04/26/2016 1205   BUN 13 04/01/2022 1331   BUN 6.0 (L) 04/26/2016 1205   CREATININE 1.13 04/01/2022 1331   CREATININE 1.3 04/26/2016 1205      Component Value Date/Time   CALCIUM 10.2 04/01/2022 1331   CALCIUM 9.8 04/26/2016 1205   ALKPHOS 56 04/01/2022 1331   ALKPHOS 60 04/26/2016 1205   AST 26 04/01/2022 1331   AST 40 (H) 04/26/2016 1205   ALT 24 04/01/2022 1331   ALT 25 04/26/2016 1205   BILITOT 1.0 04/01/2022 1331   BILITOT 0.47 04/26/2016 1205       RADIOGRAPHIC STUDIES: CT Super D Chest Wo Contrast  Result Date: 03/18/2022 CLINICAL DATA:  Right upper lobe pulmonary nodule. History of left lung cancer status post left pneumonectomy. Findings concerning for metastatic disease on recent PET-CT. * Tracking Code: BO * EXAM: CT CHEST WITHOUT CONTRAST TECHNIQUE: Multidetector CT imaging of the chest was performed using thin slice collimation for electromagnetic bronchoscopy planning purposes, without intravenous contrast. RADIATION DOSE REDUCTION: This exam was performed according to the departmental dose-optimization program which includes automated exposure control, adjustment of the mA and/or kV according to patient size and/or use of iterative reconstruction technique. COMPARISON:  PET-CT 03/12/2022.  Chest CT 12/15/2021 and 04/29/2021. FINDINGS: Cardiovascular: Atherosclerosis of the aorta, great vessels and coronary arteries. Chronic central enlargement of the pulmonary arteries consistent with pulmonary arterial hypertension. The heart size is normal. There is no pericardial effusion. Mediastinum/Nodes: A centrally necrotic precarinal mass has significantly enlarged from the previous chest CT, measuring 3.6 x 3.0 cm on image 24/2. This previously measured 2.2 x 1.5 cm and was hypermetabolic on the recent PET-CT.A subcarinal node measuring up to 1.8  cm short axis on image 31/2 has also enlarged from previous CT, although do not show significant hypermetabolic activity on the PET-CT. No axillary adenopathy. Stable volume loss in the left hemithorax with mediastinal shift to the left. The thyroid gland and esophagus appear unremarkable.  Lungs/Pleura: Status post left pneumonectomy with chronic fibrothorax. No significant right pleural effusion. There are underlying emphysematous changes throughout the right lung with interval increased diffuse central airway thickening. Cavitary right upper lobe nodule measuring 2.1 x 1.1 cm on image 45/7 has mildly enlarged from the previous CT. There is an enlarging and increasingly solid nodule medially in the right upper lobe measuring 1.0 x 0.7 cm on image 33/7. Both of these nodules were mildly hypermetabolic on PET-CT. Upper abdomen: Ill-defined low-density is noted anteriorly in the right hepatic lobe measuring 10 mm on image 62/2. This was not definitely seen previously, but there was no focal hypermetabolic activity in this region on recent PET-CT. The visualized upper abdomen otherwise appears stable. There is aortic and branch vessel atherosclerosis. Musculoskeletal/Chest wall: As correlated with recent PET-CT, evidence of metastatic disease within the sternal manubrium and T3 vertebral body. No pathologic fracture identified. Old rib fractures/thoracotomy defects bilaterally. IMPRESSION: 1. Centrally necrotic precarinal lymph node has significantly enlarged from the previous chest CT and was hypermetabolic on recent PET-CT, consistent with metastatic disease. 2. Enlarging and increasingly solid/cavitary right upper lobe pulmonary nodules, favoring primary lung cancer, although potentially metastases. 3. Subtle osseous metastases as correlated with PET-CT. 4. No recurrence identified in the left hemithorax post pneumonectomy. 5.  Aortic Atherosclerosis (ICD10-I70.0). Electronically Signed   By: Richardean Sale M.D.    On: 03/18/2022 16:44   NM PET Image Initial (PI) Skull Base To Thigh (F-18 FDG)  Result Date: 03/15/2022 CLINICAL DATA:  Follow-up treatment strategy for lung cancer. EXAM: NUCLEAR MEDICINE PET SKULL BASE TO THIGH TECHNIQUE: 8.9 mCi F-18 FDG was injected intravenously. Full-ring PET imaging was performed from the skull base to thigh after the radiotracer. CT data was obtained and used for attenuation correction and anatomic localization. Fasting blood glucose: 114 mg/dl COMPARISON:  Chest CT dated December 15, 2021; PET-CT dated December 7th 2022 FINDINGS: Mediastinal blood pool activity: SUV max 3.2 Liver activity: SUV max 3.5 NECK: Focal hypermetabolic uptake in the left paraspinous musculature with SUV max of 3.6 on fused image 40. No hypermetabolic lymph nodes in the neck. Incidental CT findings: None. CHEST: Hypermetabolic mediastinal right hilar lymph nodes. Reference right lower paratracheal lymph node measuring 1.5 cm in short axis on series 4, image 73 with SUV max of 5.4, unchanged in size. Reference right hilar lymph node measuring 1.0 cm in short axis on series 4, image 73 with SUV max 5.6, unchanged in size. Subcarinal lymph node measuring 1.8 cm in short axis on series 4, image 82, previously 0.9 cm with SUV max of 3.1. Solid pulmonary nodules of the right upper lobe measuring 9 mm on series 4, image 62 with SUV max of 2.4 and 9 mm on image 68 with SUV max of 2.3, unchanged in size when compared with prior chest CT and remeasured in similar plane. Incidental CT findings: Severe three-vessel coronary artery calcifications. Caliber thoracic aorta with moderate atherosclerotic disease. Stable postsurgical changes of left lobectomy. Mild centrilobular emphysema. ABDOMEN/PELVIS: Hypermetabolic lymph node located posterior to the left psoas muscle measuring 0.6 cm on series 4, image 162 with SUV max of 2.9. scattered areas of hypermetabolic uptake seen within the tissues no definite Incidental CT findings:  Unchanged left adrenal gland adenoma, no specific follow-up imaging is recommended for this lesion. Nonobstructing left renal stone. Severe atherosclerotic disease of the abdominal aorta. Prostatomegaly. Simple appearing cyst of the left kidney, no specific follow-up imaging is recommended for this lesion. Mild diverticulosis. Scattered areas of  focal hypermetabolic activity seen within the musculature of the bilateral buttocks, reference lesion of the right buttocks located on MPR fusion image 178 with SUV max of 2.9. Hypermetabolic uptake of the left iliopsoas muscle, reference lesion with SUV max 2.9 on fused image 162. SKELETON: Numerous hypermetabolic osseous lesions involving the manubrium, right scapula, thoracolumbar spine, bilateral ribs, and pelvis. Reference lesion of the manubrium located on series 4, image 61 with SUV max of 6.5 and associated lytic lesion on CT. Reference lesion of the lateral right third rib with SUV max 5.4, no definite CT correlate. Reference lesion of the L3 vertebral body SUV max of 6.5, subtle associated lucent lesion seen on CT. Reference lesion of the left iliac bone located adjacent to the SI joint with SUV max of 5.9 subtle associated lucency seen on CT, series 4, image 166. Incidental CT findings: None. IMPRESSION: 1. Hypermetabolic mediastinal and right hilar lymph nodes, consistent with metastatic adenopathy. 2. Subcarinal lymph node is increased in size compared with prior exam and demonstrates mild FDG uptake, concerning for additional site of nodal metastatic disease. 3. Mildly hypermetabolic solid pulmonary nodules of the right upper lobe, concerning for primary lung malignancy or metastatic disease. 4. Numerous hypermetabolic osseous lesions involving the manubrium, right scapula, thoracolumbar spine, bilateral ribs, and pelvis. 5. Scattered areas of focal hypermetabolic activity seen within the musculature of the bilateral buttocks, left psoas muscle and left  cervical paraspinal musculature with no definite correlate seen on CT, concerning for soft tissue metastatic disease. 6. Aortic Atherosclerosis (ICD10-I70.0) and Emphysema (ICD10-J43.9). Electronically Signed   By: Yetta Glassman M.D.   On: 03/15/2022 09:17    ASSESSMENT: This is a very pleasant 66 years old white male with stage IV (T1a, N2, M1 C) non-small cell lung cancer, squamous cell carcinoma presented with right upper lobe lung nodule in addition to right hilar and mediastinal lymphadenopathy and metastatic disease to several bone areas as well as musculoskeletal muscles diagnosed in October 2023. The patient also has a history of a stage IIIa non-small cell lung cancer, squamous cell carcinoma diagnosed in June 2009 status post left pneumonectomy followed by 4 cycles of adjuvant systemic chemotherapy completed in April 29, 2008 and has been on observation since that time. Detected Alteration(s) / Biomarker(s) Associated FDA-approved therapies Clinical Trial Availability % cfDNA or Amplification KRAS G12C approved by FDA Adagrasib, Sotorasib Yes 6.8%  TP53 S241C None Yes 0.9%  TP53 Q136E None Yes 0.5%  PLAN: I had a lengthy discussion with the patient and his wife today about his current disease stage, prognosis and treatment options. I personally and independently reviewed the scan images and discussed the result and showed the images to the patient and his wife. I recommended for the patient to complete the staging work-up by ordering MRI of the brain to rule out brain metastasis. I explained to the patient that he has incurable condition and all the treatment will be of palliative nature. 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 I gave the patient the option of palliative care and hospice referral versus palliative systemic chemotherapy with carboplatin for AUC of 5, paclitaxel 175 Mg/M2 and Keytruda 200 Mg IV every 3 weeks with  Neulasta support for 4 cycles followed by maintenance treatment with immunotherapy if he has no evidence for disease progression after the induction 4 cycles of concurrent chemo and immunotherapy. If the patient had evidence for disease progression in the future he could be treated with targeted therapy  for the positive KRAS G12C mutation. The patient is interested in treatment. I discussed with him the adverse effect of this treatment including but not limited to alopecia, myelosuppression, nausea and vomiting, peripheral neuropathy, liver or renal dysfunction as well as immunotherapy adverse effects. The patient is expected to start the first cycle of this treatment next week. He will have a chemotherapy education class before the first dose of his treatment. I will see him back for follow-up visit 1 week after the treatment for evaluation and management of any adverse effect of his chemotherapy. I will call his pharmacy with prescription for Compazine 10 mg p.o. every 6 hours as needed for nausea. I strongly encouraged the patient to quit smoking. The patient was advised to call immediately if he has any other concerning symptoms in the interval. The patient voices understanding of current disease status and treatment options and is in agreement with the current care plan.  All questions were answered. The patient knows to call the clinic with any problems, questions or concerns. We can certainly see the patient much sooner if necessary.  Thank you so much for allowing me to participate in the care of Benjamin Case. I will continue to follow up the patient with you and assist in his care.  The total time spent in the appointment was 90 minutes.  Disclaimer: This note was dictated with voice recognition software. Similar sounding words can inadvertently be transcribed and may not be corrected upon review.   Eilleen Kempf April 01, 2022, 2:57 PM

## 2022-04-01 NOTE — Progress Notes (Signed)
START ON PATHWAY REGIMEN - Non-Small Cell Lung     A cycle is every 21 days:     Pembrolizumab      Paclitaxel      Carboplatin   **Always confirm dose/schedule in your pharmacy ordering system**  Patient Characteristics: Stage IV Metastatic, Squamous, Molecular Analysis Completed, Alteration Present and Targeted Therapy Exhausted or EGFR Exon 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 or KRAS G12C or HER2 Present, and No Prior Chemo/Immunotherapy or No Alteration Present, PS = 0, 1, Initial  Chemotherapy/Immunotherapy, BRAF/MET/KRAS/HER2 Mutation Positive, Immunotherapy Candidate, PD-L1 Expression Positive 1-49% (TPS) / Negative / Not Tested / Awaiting Test Results Therapeutic Status: Stage IV Metastatic Histology: Squamous Cell Molecular Analysis Results: KRAS G12C Mutation Present and No Prior Chemo/Immunotherapy ECOG Performance Status: 1 Chemotherapy/Immunotherapy Line of Therapy: Initial Chemotherapy/Immunotherapy Immunotherapy Candidate Status: Candidate for Immunotherapy PD-L1 Expression Status: Awaiting Test Results Intent of Therapy: Non-Curative / Palliative Intent, Discussed with Patient

## 2022-04-02 ENCOUNTER — Telehealth: Payer: Self-pay | Admitting: Internal Medicine

## 2022-04-02 NOTE — Telephone Encounter (Signed)
Scheduled per 10/26 los, patient has been called and notified.

## 2022-04-03 ENCOUNTER — Other Ambulatory Visit: Payer: Self-pay

## 2022-04-05 ENCOUNTER — Other Ambulatory Visit: Payer: Self-pay | Admitting: *Deleted

## 2022-04-05 ENCOUNTER — Other Ambulatory Visit: Payer: Self-pay | Admitting: Internal Medicine

## 2022-04-05 ENCOUNTER — Inpatient Hospital Stay: Payer: Medicare Other

## 2022-04-05 DIAGNOSIS — C3490 Malignant neoplasm of unspecified part of unspecified bronchus or lung: Secondary | ICD-10-CM

## 2022-04-07 MED FILL — Dexamethasone Sodium Phosphate Inj 100 MG/10ML: INTRAMUSCULAR | Qty: 1 | Status: AC

## 2022-04-07 MED FILL — Fosaprepitant Dimeglumine For IV Infusion 150 MG (Base Eq): INTRAVENOUS | Qty: 5 | Status: AC

## 2022-04-07 NOTE — Progress Notes (Signed)
Pharmacist Chemotherapy Monitoring - Initial Assessment    Anticipated start date: 04/08/22   The following has been reviewed per standard work regarding the patient's treatment regimen: The patient's diagnosis, treatment plan and drug doses, and organ/hematologic function Lab orders and baseline tests specific to treatment regimen  The treatment plan start date, drug sequencing, and pre-medications Prior authorization status  Patient's documented medication list, including drug-drug interaction screen and prescriptions for anti-emetics and supportive care specific to the treatment regimen The drug concentrations, fluid compatibility, administration routes, and timing of the medications to be used The patient's access for treatment and lifetime cumulative dose history, if applicable  The patient's medication allergies and previous infusion related reactions, if applicable   Changes made to treatment plan:  switch to insurance preferred biosimilar Ellen Henri  The pharmacy team has substituted IV diphenhydramine for IV cetirizine as a premedication. Patient will be monitored for hypersensitivity reaction and adverse reactions to IV cetirizine.  Follow up needed:  N/A  Kennith Center, Pharm.D., CPP 04/07/2022@3 :52 PM

## 2022-04-08 ENCOUNTER — Inpatient Hospital Stay: Payer: Medicare Other | Attending: Internal Medicine

## 2022-04-08 ENCOUNTER — Other Ambulatory Visit: Payer: Self-pay

## 2022-04-08 ENCOUNTER — Inpatient Hospital Stay: Payer: Medicare Other

## 2022-04-08 VITALS — BP 116/77 | HR 76 | Temp 98.0°F | Resp 18

## 2022-04-08 DIAGNOSIS — C3411 Malignant neoplasm of upper lobe, right bronchus or lung: Secondary | ICD-10-CM

## 2022-04-08 DIAGNOSIS — Z888 Allergy status to other drugs, medicaments and biological substances status: Secondary | ICD-10-CM

## 2022-04-08 DIAGNOSIS — J440 Chronic obstructive pulmonary disease with acute lower respiratory infection: Secondary | ICD-10-CM | POA: Diagnosis present

## 2022-04-08 DIAGNOSIS — I700 Atherosclerosis of aorta: Secondary | ICD-10-CM | POA: Diagnosis present

## 2022-04-08 DIAGNOSIS — I2489 Other forms of acute ischemic heart disease: Secondary | ICD-10-CM | POA: Diagnosis present

## 2022-04-08 DIAGNOSIS — I4892 Unspecified atrial flutter: Secondary | ICD-10-CM | POA: Diagnosis present

## 2022-04-08 DIAGNOSIS — I10 Essential (primary) hypertension: Secondary | ICD-10-CM | POA: Diagnosis present

## 2022-04-08 DIAGNOSIS — Z79899 Other long term (current) drug therapy: Secondary | ICD-10-CM | POA: Insufficient documentation

## 2022-04-08 DIAGNOSIS — Z7982 Long term (current) use of aspirin: Secondary | ICD-10-CM

## 2022-04-08 DIAGNOSIS — Z5111 Encounter for antineoplastic chemotherapy: Secondary | ICD-10-CM | POA: Insufficient documentation

## 2022-04-08 DIAGNOSIS — C3491 Malignant neoplasm of unspecified part of right bronchus or lung: Secondary | ICD-10-CM | POA: Diagnosis present

## 2022-04-08 DIAGNOSIS — A419 Sepsis, unspecified organism: Secondary | ICD-10-CM | POA: Diagnosis not present

## 2022-04-08 DIAGNOSIS — J189 Pneumonia, unspecified organism: Secondary | ICD-10-CM | POA: Diagnosis present

## 2022-04-08 DIAGNOSIS — Z9221 Personal history of antineoplastic chemotherapy: Secondary | ICD-10-CM

## 2022-04-08 DIAGNOSIS — C7951 Secondary malignant neoplasm of bone: Secondary | ICD-10-CM | POA: Diagnosis present

## 2022-04-08 DIAGNOSIS — Z1152 Encounter for screening for COVID-19: Secondary | ICD-10-CM

## 2022-04-08 DIAGNOSIS — J90 Pleural effusion, not elsewhere classified: Secondary | ICD-10-CM | POA: Insufficient documentation

## 2022-04-08 DIAGNOSIS — Z902 Acquired absence of lung [part of]: Secondary | ICD-10-CM

## 2022-04-08 DIAGNOSIS — Z8701 Personal history of pneumonia (recurrent): Secondary | ICD-10-CM

## 2022-04-08 DIAGNOSIS — I493 Ventricular premature depolarization: Secondary | ICD-10-CM | POA: Diagnosis present

## 2022-04-08 DIAGNOSIS — K219 Gastro-esophageal reflux disease without esophagitis: Secondary | ICD-10-CM | POA: Diagnosis present

## 2022-04-08 DIAGNOSIS — J9601 Acute respiratory failure with hypoxia: Secondary | ICD-10-CM | POA: Diagnosis present

## 2022-04-08 DIAGNOSIS — F17210 Nicotine dependence, cigarettes, uncomplicated: Secondary | ICD-10-CM | POA: Diagnosis present

## 2022-04-08 DIAGNOSIS — E1142 Type 2 diabetes mellitus with diabetic polyneuropathy: Secondary | ICD-10-CM | POA: Diagnosis present

## 2022-04-08 DIAGNOSIS — I480 Paroxysmal atrial fibrillation: Secondary | ICD-10-CM | POA: Diagnosis present

## 2022-04-08 DIAGNOSIS — I2510 Atherosclerotic heart disease of native coronary artery without angina pectoris: Secondary | ICD-10-CM | POA: Diagnosis present

## 2022-04-08 DIAGNOSIS — Z809 Family history of malignant neoplasm, unspecified: Secondary | ICD-10-CM

## 2022-04-08 DIAGNOSIS — E871 Hypo-osmolality and hyponatremia: Secondary | ICD-10-CM | POA: Diagnosis present

## 2022-04-08 DIAGNOSIS — N179 Acute kidney failure, unspecified: Secondary | ICD-10-CM | POA: Diagnosis present

## 2022-04-08 LAB — CBC WITH DIFFERENTIAL (CANCER CENTER ONLY)
Abs Immature Granulocytes: 0.04 10*3/uL (ref 0.00–0.07)
Basophils Absolute: 0.1 10*3/uL (ref 0.0–0.1)
Basophils Relative: 1 %
Eosinophils Absolute: 0.2 10*3/uL (ref 0.0–0.5)
Eosinophils Relative: 2 %
HCT: 42 % (ref 39.0–52.0)
Hemoglobin: 15.5 g/dL (ref 13.0–17.0)
Immature Granulocytes: 0 %
Lymphocytes Relative: 12 %
Lymphs Abs: 1.5 10*3/uL (ref 0.7–4.0)
MCH: 34.9 pg — ABNORMAL HIGH (ref 26.0–34.0)
MCHC: 36.9 g/dL — ABNORMAL HIGH (ref 30.0–36.0)
MCV: 94.6 fL (ref 80.0–100.0)
Monocytes Absolute: 1.2 10*3/uL — ABNORMAL HIGH (ref 0.1–1.0)
Monocytes Relative: 9 %
Neutro Abs: 9.5 10*3/uL — ABNORMAL HIGH (ref 1.7–7.7)
Neutrophils Relative %: 76 %
Platelet Count: 322 10*3/uL (ref 150–400)
RBC: 4.44 MIL/uL (ref 4.22–5.81)
RDW: 11 % — ABNORMAL LOW (ref 11.5–15.5)
WBC Count: 12.5 10*3/uL — ABNORMAL HIGH (ref 4.0–10.5)
nRBC: 0 % (ref 0.0–0.2)

## 2022-04-08 LAB — CMP (CANCER CENTER ONLY)
ALT: 26 U/L (ref 0–44)
AST: 28 U/L (ref 15–41)
Albumin: 4.3 g/dL (ref 3.5–5.0)
Alkaline Phosphatase: 52 U/L (ref 38–126)
Anion gap: 10 (ref 5–15)
BUN: 13 mg/dL (ref 8–23)
CO2: 26 mmol/L (ref 22–32)
Calcium: 9.9 mg/dL (ref 8.9–10.3)
Chloride: 95 mmol/L — ABNORMAL LOW (ref 98–111)
Creatinine: 1 mg/dL (ref 0.61–1.24)
GFR, Estimated: >60 mL/min (ref >60)
Glucose, Bld: 150 mg/dL — ABNORMAL HIGH (ref 70–99)
Potassium: 4.1 mmol/L (ref 3.5–5.1)
Sodium: 131 mmol/L — ABNORMAL LOW (ref 135–145)
Total Bilirubin: 0.9 mg/dL (ref 0.3–1.2)
Total Protein: 7.4 g/dL (ref 6.5–8.1)

## 2022-04-08 LAB — TSH: TSH: 0.447 u[IU]/mL (ref 0.350–4.500)

## 2022-04-08 MED ORDER — SODIUM CHLORIDE 0.9 % IV SOLN
150.0000 mg | Freq: Once | INTRAVENOUS | Status: AC
  Administered 2022-04-08: 150 mg via INTRAVENOUS
  Filled 2022-04-08: qty 150

## 2022-04-08 MED ORDER — SODIUM CHLORIDE 0.9 % IV SOLN
200.0000 mg | Freq: Once | INTRAVENOUS | Status: AC
  Administered 2022-04-08: 200 mg via INTRAVENOUS
  Filled 2022-04-08: qty 200

## 2022-04-08 MED ORDER — SODIUM CHLORIDE 0.9 % IV SOLN
479.0000 mg | Freq: Once | INTRAVENOUS | Status: AC
  Administered 2022-04-08: 480 mg via INTRAVENOUS
  Filled 2022-04-08: qty 48

## 2022-04-08 MED ORDER — PALONOSETRON HCL INJECTION 0.25 MG/5ML
0.2500 mg | Freq: Once | INTRAVENOUS | Status: AC
  Administered 2022-04-08: 0.25 mg via INTRAVENOUS
  Filled 2022-04-08: qty 5

## 2022-04-08 MED ORDER — SODIUM CHLORIDE 0.9 % IV SOLN
175.0000 mg/m2 | Freq: Once | INTRAVENOUS | Status: AC
  Administered 2022-04-08: 348 mg via INTRAVENOUS
  Filled 2022-04-08: qty 58

## 2022-04-08 MED ORDER — SODIUM CHLORIDE 0.9 % IV SOLN: Freq: Once | INTRAVENOUS | Status: AC

## 2022-04-08 MED ORDER — SODIUM CHLORIDE 0.9 % IV SOLN
10.0000 mg | Freq: Once | INTRAVENOUS | Status: AC
  Administered 2022-04-08: 10 mg via INTRAVENOUS
  Filled 2022-04-08: qty 10

## 2022-04-08 MED ORDER — CETIRIZINE HCL 10 MG/ML IV SOLN
10.0000 mg | Freq: Once | INTRAVENOUS | Status: AC
  Administered 2022-04-08: 10 mg via INTRAVENOUS
  Filled 2022-04-08: qty 1

## 2022-04-08 MED ORDER — FAMOTIDINE IN NACL 20-0.9 MG/50ML-% IV SOLN
20.0000 mg | Freq: Once | INTRAVENOUS | Status: AC
  Administered 2022-04-08: 20 mg via INTRAVENOUS
  Filled 2022-04-08: qty 50

## 2022-04-08 NOTE — Patient Instructions (Signed)
[facility]  Discharge Instructions: Thank you for choosing [facility] to provide your oncology and hematology care.   If you have a lab appointment with the [facility], please go directly to the [facility] and check in at the registration area.   Wear comfortable clothing and clothing appropriate for easy access to any Portacath or PICC line.   We strive to give you quality time with your provider. You may need to reschedule your appointment if you arrive late (15 or more minutes).  Arriving late affects you and other patients whose appointments are after yours.  Also, if you miss three or more appointments without notifying the office, you may be dismissed from the clinic at the provider's discretion.      For prescription refill requests, have your pharmacy contact our office and allow 72 hours for refills to be completed.    Today you received the following chemotherapy and/or immunotherapy agents; Keytruda, Paclitaxel, & Carboplatin      To help prevent nausea and vomiting after your treatment, we encourage you to take your nausea medication as directed.  BELOW ARE SYMPTOMS THAT SHOULD BE REPORTED IMMEDIATELY: *FEVER GREATER THAN 100.4 F (38 C) OR HIGHER *CHILLS OR SWEATING *NAUSEA AND VOMITING THAT IS NOT CONTROLLED WITH YOUR NAUSEA MEDICATION *UNUSUAL SHORTNESS OF BREATH *UNUSUAL BRUISING OR BLEEDING *URINARY PROBLEMS (pain or burning when urinating, or frequent urination) *BOWEL PROBLEMS (unusual diarrhea, constipation, pain near the anus) TENDERNESS IN MOUTH AND THROAT WITH OR WITHOUT PRESENCE OF ULCERS (sore throat, sores in mouth, or a toothache) UNUSUAL RASH, SWELLING OR PAIN  UNUSUAL VAGINAL DISCHARGE OR ITCHING   Items with * indicate a potential emergency and should be followed up as soon as possible or go to the Emergency Department if any problems should occur.  Please show the CHEMOTHERAPY ALERT CARD or IMMUNOTHERAPY  ALERT CARD at check-in to the Emergency Department and triage nurse.  Should you have questions after your visit or need to cancel or reschedule your appointment, please contact [facility]  Dept: [phone]  and follow the prompts.  Office hours are 8:00 a.m. to 4:30 p.m. Monday - Friday. Please note that voicemails left after 4:00 p.m. may not be returned until the following business day.  We are closed weekends and major holidays. You have access to a nurse at all times for urgent questions. Please call the main number to the clinic Dept: [phone] and follow the prompts.   For any non-urgent questions, you may also contact your provider using MyChart. We now offer e-Visits for anyone 44 and older to request care online for non-urgent symptoms. For details visit [email or web address].   Also download the MyChart app! Go to the app store, search "MyChart", open the app, select [facility], and log in with your MyChart username and password.  Masks are optional in the cancer centers. If you would like for your care team to wear a mask while they are taking care of you, please let them know. You may have one support Maeli Spacek who is at least 68 years old accompany you for your appointments.

## 2022-04-09 ENCOUNTER — Telehealth: Payer: Self-pay | Admitting: Internal Medicine

## 2022-04-09 ENCOUNTER — Telehealth: Payer: Self-pay

## 2022-04-09 NOTE — Telephone Encounter (Signed)
Called patient regarding upcoming November, December and January appointments. Patient is notified.

## 2022-04-09 NOTE — Telephone Encounter (Signed)
[name] states that Benjamin Case is doing fine.  He is having some acid indigestion. He  took 2 Nexium  40 mg last night and some Pepto-bismol this am.  He is fasting for lab work with his PCP this afternoon.  He took his pain med on an empty stomach as well and drinking black coffee.  Suggested he try taking his the compazine. If the acid indigestion gets worse or does not resolve they can call the office at [phone]. He is eating when he can, drinking and urinating well.

## 2022-04-09 NOTE — Telephone Encounter (Signed)
-----   Message from Daphane Shepherd, RN sent at 04/08/2022  6:00 PM EDT ----- Regarding: First time Chemo Patient of [name], first time [place], paclitaxel, and [place]. Tolerated very well, VSS and no signs of distress noted.

## 2022-04-10 ENCOUNTER — Inpatient Hospital Stay: Payer: Medicare Other

## 2022-04-10 VITALS — BP 146/80 | HR 118 | Temp 97.7°F | Resp 18

## 2022-04-10 DIAGNOSIS — C3411 Malignant neoplasm of upper lobe, right bronchus or lung: Secondary | ICD-10-CM

## 2022-04-10 MED ORDER — PEGFILGRASTIM-CBQV 6 MG/0.6ML ~~LOC~~ SOSY
6.0000 mg | PREFILLED_SYRINGE | Freq: Once | SUBCUTANEOUS | Status: AC
  Administered 2022-04-10: 6 mg via SUBCUTANEOUS

## 2022-04-10 NOTE — Patient Instructions (Signed)

## 2022-04-11 ENCOUNTER — Inpatient Hospital Stay (HOSPITAL_COMMUNITY)
Admission: EM | Admit: 2022-04-11 | Discharge: 2022-04-17 | DRG: 871 | Disposition: A | Discharge status: Home Health | Payer: Medicare Other | Attending: Family Medicine | Admitting: Family Medicine

## 2022-04-11 ENCOUNTER — Other Ambulatory Visit: Payer: Self-pay

## 2022-04-11 ENCOUNTER — Emergency Department (HOSPITAL_COMMUNITY): Payer: Medicare Other

## 2022-04-11 ENCOUNTER — Encounter (HOSPITAL_COMMUNITY): Payer: Self-pay | Admitting: *Deleted

## 2022-04-11 DIAGNOSIS — R9431 Abnormal electrocardiogram [ECG] [EKG]: Secondary | ICD-10-CM | POA: Diagnosis not present

## 2022-04-11 DIAGNOSIS — Z79899 Other long term (current) drug therapy: Secondary | ICD-10-CM | POA: Diagnosis not present

## 2022-04-11 DIAGNOSIS — I480 Paroxysmal atrial fibrillation: Secondary | ICD-10-CM | POA: Diagnosis present

## 2022-04-11 DIAGNOSIS — R0902 Hypoxemia: Secondary | ICD-10-CM | POA: Diagnosis not present

## 2022-04-11 DIAGNOSIS — R0600 Dyspnea, unspecified: Secondary | ICD-10-CM

## 2022-04-11 DIAGNOSIS — A419 Sepsis, unspecified organism: Principal | ICD-10-CM | POA: Diagnosis present

## 2022-04-11 DIAGNOSIS — Z9221 Personal history of antineoplastic chemotherapy: Secondary | ICD-10-CM

## 2022-04-11 DIAGNOSIS — Z809 Family history of malignant neoplasm, unspecified: Secondary | ICD-10-CM | POA: Diagnosis not present

## 2022-04-11 DIAGNOSIS — E1142 Type 2 diabetes mellitus with diabetic polyneuropathy: Secondary | ICD-10-CM | POA: Diagnosis present

## 2022-04-11 DIAGNOSIS — I10 Essential (primary) hypertension: Secondary | ICD-10-CM | POA: Diagnosis present

## 2022-04-11 DIAGNOSIS — Z902 Acquired absence of lung [part of]: Secondary | ICD-10-CM | POA: Diagnosis not present

## 2022-04-11 DIAGNOSIS — J90 Pleural effusion, not elsewhere classified: Secondary | ICD-10-CM | POA: Diagnosis present

## 2022-04-11 DIAGNOSIS — Z8701 Personal history of pneumonia (recurrent): Secondary | ICD-10-CM | POA: Diagnosis not present

## 2022-04-11 DIAGNOSIS — N179 Acute kidney failure, unspecified: Secondary | ICD-10-CM | POA: Diagnosis present

## 2022-04-11 DIAGNOSIS — I4892 Unspecified atrial flutter: Secondary | ICD-10-CM | POA: Diagnosis present

## 2022-04-11 DIAGNOSIS — K219 Gastro-esophageal reflux disease without esophagitis: Secondary | ICD-10-CM | POA: Diagnosis present

## 2022-04-11 DIAGNOSIS — J449 Chronic obstructive pulmonary disease, unspecified: Secondary | ICD-10-CM | POA: Diagnosis present

## 2022-04-11 DIAGNOSIS — J189 Pneumonia, unspecified organism: Secondary | ICD-10-CM | POA: Diagnosis present

## 2022-04-11 DIAGNOSIS — I272 Pulmonary hypertension, unspecified: Secondary | ICD-10-CM | POA: Diagnosis not present

## 2022-04-11 DIAGNOSIS — E871 Hypo-osmolality and hyponatremia: Secondary | ICD-10-CM | POA: Diagnosis present

## 2022-04-11 DIAGNOSIS — J440 Chronic obstructive pulmonary disease with acute lower respiratory infection: Secondary | ICD-10-CM | POA: Diagnosis present

## 2022-04-11 DIAGNOSIS — I2489 Other forms of acute ischemic heart disease: Secondary | ICD-10-CM | POA: Diagnosis present

## 2022-04-11 DIAGNOSIS — C7951 Secondary malignant neoplasm of bone: Secondary | ICD-10-CM | POA: Diagnosis present

## 2022-04-11 DIAGNOSIS — Z1152 Encounter for screening for COVID-19: Secondary | ICD-10-CM

## 2022-04-11 DIAGNOSIS — Z7982 Long term (current) use of aspirin: Secondary | ICD-10-CM

## 2022-04-11 DIAGNOSIS — F17210 Nicotine dependence, cigarettes, uncomplicated: Secondary | ICD-10-CM | POA: Diagnosis present

## 2022-04-11 DIAGNOSIS — J9601 Acute respiratory failure with hypoxia: Secondary | ICD-10-CM | POA: Diagnosis present

## 2022-04-11 DIAGNOSIS — Z888 Allergy status to other drugs, medicaments and biological substances status: Secondary | ICD-10-CM | POA: Diagnosis not present

## 2022-04-11 DIAGNOSIS — C3491 Malignant neoplasm of unspecified part of right bronchus or lung: Secondary | ICD-10-CM | POA: Insufficient documentation

## 2022-04-11 DIAGNOSIS — I493 Ventricular premature depolarization: Secondary | ICD-10-CM | POA: Diagnosis present

## 2022-04-11 DIAGNOSIS — I2510 Atherosclerotic heart disease of native coronary artery without angina pectoris: Secondary | ICD-10-CM | POA: Diagnosis present

## 2022-04-11 DIAGNOSIS — I700 Atherosclerosis of aorta: Secondary | ICD-10-CM | POA: Diagnosis present

## 2022-04-11 DIAGNOSIS — C3490 Malignant neoplasm of unspecified part of unspecified bronchus or lung: Secondary | ICD-10-CM | POA: Diagnosis not present

## 2022-04-11 LAB — COMPREHENSIVE METABOLIC PANEL
ALT: 39 U/L (ref 0–44)
AST: 44 U/L — ABNORMAL HIGH (ref 15–41)
Albumin: 3.8 g/dL (ref 3.5–5.0)
Alkaline Phosphatase: 54 U/L (ref 38–126)
Anion gap: 10 (ref 5–15)
BUN: 31 mg/dL — ABNORMAL HIGH (ref 8–23)
CO2: 27 mmol/L (ref 22–32)
Calcium: 8.4 mg/dL — ABNORMAL LOW (ref 8.9–10.3)
Chloride: 91 mmol/L — ABNORMAL LOW (ref 98–111)
Creatinine, Ser: 1.21 mg/dL (ref 0.61–1.24)
GFR, Estimated: >60 mL/min (ref >60)
Glucose, Bld: 158 mg/dL — ABNORMAL HIGH (ref 70–99)
Potassium: 4.1 mmol/L (ref 3.5–5.1)
Sodium: 128 mmol/L — ABNORMAL LOW (ref 135–145)
Total Bilirubin: 1.2 mg/dL (ref 0.3–1.2)
Total Protein: 7 g/dL (ref 6.5–8.1)

## 2022-04-11 LAB — CBC WITH DIFFERENTIAL/PLATELET
Abs Immature Granulocytes: 2.47 10*3/uL — ABNORMAL HIGH (ref 0.00–0.07)
Basophils Absolute: 0.1 10*3/uL (ref 0.0–0.1)
Basophils Relative: 0 %
Eosinophils Absolute: 0.2 10*3/uL (ref 0.0–0.5)
Eosinophils Relative: 0 %
HCT: 39.7 % (ref 39.0–52.0)
Hemoglobin: 13.7 g/dL (ref 13.0–17.0)
Immature Granulocytes: 7 %
Lymphocytes Relative: 2 %
Lymphs Abs: 0.9 10*3/uL (ref 0.7–4.0)
MCH: 34.3 pg — ABNORMAL HIGH (ref 26.0–34.0)
MCHC: 34.5 g/dL (ref 30.0–36.0)
MCV: 99.3 fL (ref 80.0–100.0)
Monocytes Absolute: 0.3 10*3/uL (ref 0.1–1.0)
Monocytes Relative: 1 %
Neutro Abs: 32.3 10*3/uL — ABNORMAL HIGH (ref 1.7–7.7)
Neutrophils Relative %: 90 %
Platelets: 215 10*3/uL (ref 150–400)
RBC: 4 MIL/uL — ABNORMAL LOW (ref 4.22–5.81)
RDW: 11.3 % — ABNORMAL LOW (ref 11.5–15.5)
WBC: 36.2 10*3/uL — ABNORMAL HIGH (ref 4.0–10.5)
nRBC: 0 % (ref 0.0–0.2)

## 2022-04-11 LAB — TROPONIN I (HIGH SENSITIVITY)
Troponin I (High Sensitivity): 529 ng/L (ref <18)
Troponin I (High Sensitivity): 594 ng/L (ref <18)

## 2022-04-11 LAB — RESP PANEL BY RT-PCR (FLU A&B, COVID) ARPGX2
Influenza A by PCR: NEGATIVE
Influenza B by PCR: NEGATIVE
SARS Coronavirus 2 by RT PCR: NEGATIVE

## 2022-04-11 LAB — LACTIC ACID, PLASMA
Lactic Acid, Venous: 1.2 mmol/L (ref 0.5–1.9)
Lactic Acid, Venous: 2.1 mmol/L (ref 0.5–1.9)

## 2022-04-11 LAB — BRAIN NATRIURETIC PEPTIDE: B Natriuretic Peptide: 552.8 pg/mL — ABNORMAL HIGH (ref 0.0–100.0)

## 2022-04-11 LAB — MRSA NEXT GEN BY PCR, NASAL: MRSA by PCR Next Gen: NOT DETECTED

## 2022-04-11 LAB — PROCALCITONIN: Procalcitonin: 0.27 ng/mL

## 2022-04-11 MED ORDER — SODIUM CHLORIDE 0.9 % IV SOLN
2.0000 g | Freq: Once | INTRAVENOUS | Status: AC
  Administered 2022-04-11: 2 g via INTRAVENOUS
  Filled 2022-04-11: qty 12.5

## 2022-04-11 MED ORDER — ONDANSETRON HCL 4 MG/2ML IJ SOLN: 4.0000 mg | Freq: Four times a day (QID) | INTRAMUSCULAR | Status: DC | PRN

## 2022-04-11 MED ORDER — SODIUM CHLORIDE 0.9 % IV SOLN: INTRAVENOUS | Status: DC

## 2022-04-11 MED ORDER — SODIUM CHLORIDE 0.9 % IV SOLN
2.0000 g | Freq: Three times a day (TID) | INTRAVENOUS | Status: DC
  Administered 2022-04-11 – 2022-04-17 (×17): 2 g via INTRAVENOUS
  Filled 2022-04-11 (×17): qty 12.5

## 2022-04-11 MED ORDER — VANCOMYCIN HCL 1500 MG/300ML IV SOLN
1500.0000 mg | Freq: Once | INTRAVENOUS | Status: AC
  Administered 2022-04-11: 1500 mg via INTRAVENOUS
  Filled 2022-04-11: qty 300

## 2022-04-11 MED ORDER — CHLORHEXIDINE GLUCONATE CLOTH 2 % EX PADS
6.0000 | MEDICATED_PAD | Freq: Every day | CUTANEOUS | Status: DC
  Administered 2022-04-11 – 2022-04-13 (×3): 6 via TOPICAL

## 2022-04-11 MED ORDER — ACETAMINOPHEN 325 MG PO TABS
650.0000 mg | ORAL_TABLET | Freq: Four times a day (QID) | ORAL | Status: DC | PRN
  Administered 2022-04-11: 650 mg via ORAL
  Filled 2022-04-11: qty 2

## 2022-04-11 MED ORDER — LACTATED RINGERS IV BOLUS
250.0000 mL | Freq: Once | INTRAVENOUS | Status: AC
  Administered 2022-04-11: 250 mL via INTRAVENOUS

## 2022-04-11 MED ORDER — HYDRALAZINE HCL 20 MG/ML IJ SOLN: 10.0000 mg | Freq: Four times a day (QID) | INTRAMUSCULAR | Status: DC | PRN

## 2022-04-11 MED ORDER — ENOXAPARIN SODIUM 40 MG/0.4ML IJ SOSY
40.0000 mg | PREFILLED_SYRINGE | INTRAMUSCULAR | Status: DC
  Administered 2022-04-11: 40 mg via SUBCUTANEOUS
  Filled 2022-04-11: qty 0.4

## 2022-04-11 MED ORDER — ZOLPIDEM TARTRATE 5 MG PO TABS
5.0000 mg | ORAL_TABLET | Freq: Every evening | ORAL | Status: DC | PRN
  Administered 2022-04-11 – 2022-04-12 (×2): 5 mg via ORAL
  Filled 2022-04-11 (×2): qty 1

## 2022-04-11 MED ORDER — ACETAMINOPHEN 650 MG RE SUPP: 650.0000 mg | Freq: Four times a day (QID) | RECTAL | Status: DC | PRN

## 2022-04-11 MED ORDER — GUAIFENESIN-DM 100-10 MG/5ML PO SYRP: 5.0000 mL | ORAL_SOLUTION | Freq: Four times a day (QID) | ORAL | Status: DC | PRN

## 2022-04-11 MED ORDER — IOHEXOL 350 MG/ML SOLN
75.0000 mL | Freq: Once | INTRAVENOUS | Status: AC | PRN
  Administered 2022-04-11: 75 mL via INTRAVENOUS

## 2022-04-11 MED ORDER — LORATADINE 10 MG PO TABS
10.0000 mg | ORAL_TABLET | Freq: Every day | ORAL | Status: DC
  Administered 2022-04-11 – 2022-04-17 (×7): 10 mg via ORAL
  Filled 2022-04-11 (×7): qty 1

## 2022-04-11 MED ORDER — ORAL CARE MOUTH RINSE: 15.0000 mL | OROMUCOSAL | Status: DC | PRN

## 2022-04-11 MED ORDER — ONDANSETRON HCL 4 MG PO TABS: 4.0000 mg | ORAL_TABLET | Freq: Four times a day (QID) | ORAL | Status: DC | PRN

## 2022-04-11 MED ORDER — IPRATROPIUM-ALBUTEROL 0.5-2.5 (3) MG/3ML IN SOLN
3.0000 mL | Freq: Four times a day (QID) | RESPIRATORY_TRACT | Status: DC | PRN
  Administered 2022-04-12 – 2022-04-13 (×2): 3 mL via RESPIRATORY_TRACT
  Filled 2022-04-11 (×3): qty 3

## 2022-04-11 NOTE — ED Triage Notes (Signed)
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 On 4L  02. Pt sats 84% started on 4L remained 94%.

## 2022-04-11 NOTE — Progress Notes (Signed)
A consult was received from an ED physician for vancomycin and cefepime per pharmacy dosing.  The patient's profile has been reviewed for ht/wt/allergies/indication/available labs.   A one time order has been placed for vancomycin 1500mg  and cefepime 2g.    Further antibiotics/pharmacy consults should be ordered by admitting physician if indicated.                       Thank you, Gretta Arab PharmD, BCPS [facility] main pharmacy [phone] 04/11/2022 3:01 PM

## 2022-04-11 NOTE — Progress Notes (Signed)
PHARMACY NOTE -  Cefepime  Pharmacy has been assisting with dosing of cefepime for PNA. Dosage remains stable at 2g IV q8 hr and further renal adjustments per institutional Pharmacy antibiotic protocol  Pharmacy will sign off, following peripherally for culture results or dose adjustments. Please reconsult if a change in clinical status warrants re-evaluation of dosage.  Reuel Boom, PharmD, BCPS [phone] 04/11/2022, 5:20 PM

## 2022-04-11 NOTE — H&P (Signed)
[facility] History and Physical  Benjamin Case QQP:[id number] DOB: [birth year] DOA: 04/11/2022 PCP: Benjamin Jordan, MD  Admitted from: Home Chief Complaint: Shortness of breath   History of Present Illness: Benjamin Case is a 66 y.o. 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, DM2, HTN, COPD, GERD, treated hep C, peripheral neuropathy, arthritis. Patient presented to the ED this afternoon with complaint of worsening shortness of breath over the last 24 hours, no fever, no cough, no pain.  At home, his O2 sat was at 80% on room air.  Not on supplemental oxygen at baseline. He continues to smoke 1 pack/day, has been smoking for last 50 years.  He also drinks 2-3 mixed alcoholic drinks on a daily basis.  No history of drug abuse. Follows up with [name]. 11/2 Thursday, last chemotherapy 11/4, received an injection of Benjamin Case for prevention of decreased white blood cell count.  In the ED, he was afebrile, heart rate in 100s, blood pressure in 100s, O2 sat 80 to 84% on room air and improved to more than 90% on 4 L oxygen by nasal cannula. Labs with WC count elevated to 36.2, lactic acid normal at 1.2, sodium 128, BUN/creatinine 31/1.21, BNP 553, troponin 529 COVID PCR/influenza negative Blood culture sent CT angio chest findings as below compared to CT scan from a month ago.  1. No pulmonary embolus. 2. New areas of ground-glass and reticulonodular opacities throughout the right lung, predominantly involving the right upper and middle lobes. This is likely infectious, including atypical viral organisms. 3. Small to moderate right pleural effusion 4. Right hilar adenopathy causes narrowing of the right mainstem bronchus. 5. Enlarging mediastinal adenopathy consistent with worsening nodal disease. 6. Enlarging nodules in the right upper lobe.  7.  Left lateral ninth rib fracture is new. Osseous metastatic disease involving the sternum and T3 vertebral body 8. There are multiple low-density lesions in the liver suspicious for metastatic  disease.   Patient was started on IV antibiotic coverage with IV cefepime, IV vancomycin. Hospitalist service was consulted for inpatient admission and management.  At the time of my evaluation, patient was propped up on bed, 4lpm O2. Wife at bedside. We discussed the CT scan findings.   Review of Systems:  All systems were reviewed and were negative unless otherwise mentioned in the HPI   Past medical history: Past Medical History:  Diagnosis Date   Arthritis    back   COPD (chronic obstructive pulmonary disease) ([place])    Diabetes mellitus without complication (HCC)    no medications , monitors cbg at home with monitor, avg no more than 150s at home    Dyspnea    increased exertion; pt states can climb flight of stairs w/o having to stop prior to reaching top ; ongoing reports it is due to "having one lung"    GERD (gastroesophageal reflux disease)    Hepatitis    hepatitis C; pt states competed treatments, unsure what medication he was treated with    History of chemotherapy    Hypertension    reports its been over a year since he stopped taking bp medication, reports he made her medical doctor aware that he was topping it    lung ca dx'd 12/2007   lt lobectomy   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   and back - see note in [facility] 12/26/21  Numbness    FEET BILATERAL   Pneumonia 2009   Right upper lobe pulmonary nodule 04/29/2021   Rotator cuff tear    right     Past surgical history: Past Surgical History:  Procedure Laterality Date   ARTHROSCOPIC REPAIR ACL Right    BRONCHIAL BIOPSY  06/09/2021   Procedure: BRONCHIAL BIOPSIES;  Surgeon: Garner Nash, DO;  Location: [place] [facility];  Service: Pulmonary;;   BRONCHIAL BRUSHINGS  06/09/2021   Procedure: BRONCHIAL BRUSHINGS;  Surgeon:  Garner Nash, DO;  Location: [place] [facility];  Service: Pulmonary;;   BRONCHIAL WASHINGS  06/09/2021   Procedure: BRONCHIAL WASHINGS;  Surgeon: Garner Nash, DO;  Location: [facility] [place] [facility];  Service: Pulmonary;;   CATARACT EXTRACTION, BILATERAL  2019   DECOMPRESSIVE LUMBAR LAMINECTOMY LEVEL 1 N/A 03/24/2016   Procedure: L3-4 CENTRAL DECOMPRESSION LUMBER LAMINECTOMY;  Surgeon: Latanya Maudlin, MD;  Location: [facility];  Service: Orthopedics;  Laterality: N/A;   FINE NEEDLE ASPIRATION  03/23/2022   Procedure: FINE NEEDLE ASPIRATION (FNA) LINEAR;  Surgeon: Garner Nash, DO;  Location: [facility];  Service: Pulmonary;;   FOOT SURGERY Bilateral    HAND SURGERY Bilateral    LOBECTOMY Left 2009   pneumonectomy Left 2009   [name]   SHOULDER ARTHROSCOPY WITH DISTAL CLAVICLE RESECTION Right 12/14/2018   Procedure: SHOULDER ARTHROSCOPY WITH DISTAL CLAVICLE RESECTION;  Surgeon: Tania Ade, MD;  Location: [facility];  Service: Orthopedics;  Laterality: Right;   SHOULDER ARTHROSCOPY WITH ROTATOR CUFF REPAIR Right 12/14/2018   Procedure: SHOULDER ARTHROSCOPY WITH ROTATOR CUFF REPAIR;  Surgeon: Tania Ade, MD;  Location: [facility];  Service: Orthopedics;  Laterality: Right;   SHOULDER ARTHROSCOPY WITH SUBACROMIAL DECOMPRESSION Right 12/14/2018   Procedure: SHOULDER ARTHROSCOPY WITH SUBACROMIAL DECOMPRESSION;  Surgeon: Tania Ade, MD;  Location: [facility];  Service: Orthopedics;  Laterality: Right;   TRIGGER FINGER RELEASE     right hand, pointer finger    VIDEO BRONCHOSCOPY WITH ENDOBRONCHIAL ULTRASOUND Right 03/23/2022   Procedure: VIDEO BRONCHOSCOPY WITH ENDOBRONCHIAL ULTRASOUND;  Surgeon: Garner Nash, DO;  Location: [place];  Service: Pulmonary;  Laterality: Right;   VIDEO BRONCHOSCOPY WITH RADIAL ENDOBRONCHIAL ULTRASOUND  06/09/2021   Procedure: VIDEO BRONCHOSCOPY WITH RADIAL ENDOBRONCHIAL ULTRASOUND;  Surgeon: Garner Nash, DO;  Location: [place] [facility];  Service: Pulmonary;;     Social History:  reports that he has been smoking cigarettes. He has a 22.50 pack-year smoking history. He has never used smokeless tobacco. He reports current alcohol use. He reports that he does not use drugs.  Allergies:  Allergies  Allergen Reactions   Doxepin Hcl Other (See Comments)   Doxepin hcl   Family history:  Family History  Problem Relation Age of Onset   Cancer Mother      Home Meds: Prior to Admission medications   Medication Sig Start Date End Date Taking? Authorizing Provider  ALPRAZolam Duanne Moron) 1 MG tablet Take 1 mg by mouth 3 (three) times daily as needed for anxiety. 04/20/15   [provider]  Ascorbic Acid (VITAMIN C) 1000 MG tablet Take 1,000 mg by mouth daily.    [provider]  aspirin EC 81 MG tablet Take 81 mg by mouth daily.    [provider]  Aspirin-Caffeine (BC FAST PAIN RELIEF) 845-65 MG PACK Take 1 packet by mouth daily as needed (pain.).    [provider]  atorvastatin (LIPITOR) 10 MG tablet Take 10 mg by mouth at bedtime.     [provider]  B Complex-C (  SUPER B COMPLEX PO) Take 1 tablet by mouth daily.    [provider]  esomeprazole (NEXIUM) 40 MG capsule Take 40 mg by mouth daily as needed (for heartburn).     [provider]  guaiFENesin (MUCINEX) 600 MG 12 hr tablet Take 600 mg by mouth 2 (two) times daily.    [provider]  ipratropium-albuterol (DUONEB) 0.5-2.5 (3) MG/3ML SOLN Take 3 mLs by nebulization 2 (two) times daily.    [provider]  meloxicam (MOBIC) 15 MG tablet Take 15 mg by mouth daily. 03/07/22   [provider]  methocarbamol (ROBAXIN) 500 MG tablet Take 500 mg by mouth 2 (two) times daily as needed for muscle spasms. 03/12/22   [provider]  Omega-3 Fatty Acids ([employer] FISH OIL) 1000 MG CAPS Take 1,000 mg by mouth every evening.    [provider]  oxyCODONE-acetaminophen (PERCOCET) 10-325 MG tablet Take 1  tablet by mouth 5 (five) times daily as needed for pain. 05/04/21   [provider]  pregabalin (LYRICA) 75 MG capsule Take 75 mg by mouth 2 (two) times daily. 02/22/22   [provider]  prochlorperazine (COMPAZINE) 10 MG tablet Take 1 tablet (10 mg total) by mouth every 6 (six) hours as needed for nausea or vomiting. 04/01/22   Curt Bears, MD  vitamin B-12 (CYANOCOBALAMIN) 1000 MCG tablet Take 1,000 mcg by mouth daily.    [provider]    Physical Exam: Vitals:   04/11/22 1339 04/11/22 1346 04/11/22 1530 04/11/22 1630  BP:  109/69 108/62 125/74  Pulse:  (!) 116 (!) 110 (!) 106  Resp:   (!) 22 (!) 23  Temp:  98.7 F (37.1 C)    TempSrc:  Oral    SpO2:  96% 95% 97%  Weight: 80.7 kg     Height: 5\' 10"  (1.778 m)      Wt Readings from Last 3 Encounters:  04/11/22 80.7 kg  04/01/22 80 kg  03/30/22 81.1 kg   Body mass index is 25.54 kg/m.  General exam: Pleasant, elderly Caucasian male.  Not in pain at this time Skin: No rashes, lesions or ulcers. HEENT: Atraumatic, normocephalic, no obvious bleeding Lungs: Clear to auscultation on the right side. S/p prior left lung resection CVS: Heart rate in 90s, regular rhythm, no murmur GI/Abd soft, nontender, nondistended, bowel sound present CNS: Alert, awake, oriented x3 Psychiatry: Anxious Extremities: No pedal edema, no calf tenderness     Consult Orders  (From admission, onward)           Start     Ordered   04/11/22 1630  Consult to hospitalist  Once       Provider:  (Not yet assigned)  Question Answer Comment  Place call to: [facility]   Reason for Consult Admit      04/11/22 1629            Labs on Admission:   CBC: Recent Labs  Lab 04/08/22 1025 04/11/22 1427  WBC 12.5* 36.2*  NEUTROABS 9.5* 32.3*  HGB 15.5 13.7  HCT 42.0 39.7  MCV 94.6 99.3  PLT 322 532    Basic Metabolic Panel: Recent Labs  Lab 04/08/22 1025 04/11/22 1427  NA 131* 128*  K 4.1 4.1   CL 95* 91*  CO2 26 27  GLUCOSE 150* 158*  BUN 13 31*  CREATININE 1.00 1.21  CALCIUM 9.9 8.4*    Liver Function Tests: Recent Labs  Lab 04/08/22 1025 04/11/22 1427  AST 28 44*  ALT 26 39  ALKPHOS 52 54  BILITOT 0.9 1.2  PROT 7.4 7.0  ALBUMIN 4.3 3.8   No results for input(s): "LIPASE", "AMYLASE" in the last 168 hours. No results for input(s): "AMMONIA" in the last 168 hours.  Cardiac Enzymes: No results for input(s): "CKTOTAL", "CKMB", "CKMBINDEX", "TROPONINI" in the last 168 hours.  BNP (last 3 results) Recent Labs    04/11/22 1428  BNP 552.8*    ProBNP (last 3 results) No results for input(s): "PROBNP" in the last 8760 hours.  CBG: No results for input(s): "GLUCAP" in the last 168 hours.  Lipase  No results found for: "LIPASE"   Urinalysis    Component Value Date/Time   COLORURINE YELLOW 12/28/2007 0610   APPEARANCEUR CLEAR 12/28/2007 0610   LABSPEC 1.017 12/28/2007 0610   PHURINE 6.0 12/28/2007 0610   GLUCOSEU NEGATIVE 12/28/2007 0610   HGBUR TRACE (A) 12/28/2007 0610   BILIRUBINUR NEGATIVE 12/28/2007 0610   KETONESUR NEGATIVE 12/28/2007 0610   PROTEINUR 30 (A) 12/28/2007 0610   UROBILINOGEN 0.2 12/28/2007 0610   NITRITE NEGATIVE 12/28/2007 0610   LEUKOCYTESUR NEGATIVE 12/28/2007 0610     Drugs of Abuse  No results found for: "LABOPIA", "COCAINSCRNUR", "LABBENZ", "AMPHETMU", "THCU", "LABBARB"    Radiological Exams on Admission: CT Angio Chest PE W and/or Wo Contrast  Result Date: 04/11/2022 CLINICAL DATA:  Pulmonary embolism (PE) suspected, unknown D-dimer Shortness of breath. Radiologic records indicates history of lung cancer, prior pneumonectomy. EXAM: CT ANGIOGRAPHY CHEST WITH CONTRAST TECHNIQUE: Multidetector CT imaging of the chest was performed using the standard protocol during bolus administration of intravenous contrast. Multiplanar CT image reconstructions and MIPs were obtained to evaluate the vascular anatomy. RADIATION DOSE REDUCTION:  This exam was performed according to the departmental dose-optimization program which includes automated exposure control, adjustment of the mA and/or kV according to patient size and/or use of iterative reconstruction technique. CONTRAST:  4mL OMNIPAQUE IOHEXOL 350 MG/ML SOLN COMPARISON:  Chest radiograph earlier today. CT 03/17/2022. PET CT 03/12/2022 FINDINGS: Cardiovascular: There are no filling defects within the pulmonary arteries to suggest pulmonary embolus. Chronic central enlargement of the main pulmonary artery. Grossly normal heart size, shifted to the left due to pneumonectomy changes. There are coronary artery calcifications. Atherosclerosis of the thoracic aorta without aneurysm or dissection. Mediastinum/Nodes: Enlarging precarinal node measuring 2.8 x 4.2 cm, series 5, image 52, previously 3.6 x 3 cm. 19 mm subcarinal node series 5, image 65, previously 18 mm. There is a 2 x 4 cm right hilar node series 5, image 66, comparison with prior is limited due to lack contrast on that exam. Low right hilar nodes measure up to 12 mm. Scattered retained debris in the esophagus. Stable volume loss in the left hemithorax post pneumonectomy with mediastinal shift. 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 Small to moderate-sized right pleural effusion is new from prior exam. There are new areas of ground-glass and reticulonodular opacities throughout the right lung, predominantly involving the right upper and middle lobes. Previous right upper lobe cavitary nodule is not well assessed on the current exam due to motion, in the region of series 11, image 44. r medial right apical nodule has increased in size 13 x 9 mm series 11, image 34, previously 10 x 7 mm. Enlarging right apical nodule at 8 mm series 11, image 27, previously 4 mm. There is narrowing of the right mainstem bronchus with middle and lower lobe bronchial thickening. Occasional areas of  mucoid  impaction in the lower lobe. Breathing motion artifact limits detailed assessment. Upper Abdomen: Low-density liver lesions in the left lobe is unchanged from recent chest CT, series 5, image 125 there are multiple additional low-density lesions in the right and left hepatic lobe. These are not definitively seen on recent CT under typical of metastatic disease. Musculoskeletal: Patient with known osseous metastatic disease. Lucency involving the posterior aspect of T3 vertebral body, series 8, image 111. Lucent lesion in the manubrium is again seen. Left lateral ninth rib fracture is new. There are multiple remote left rib fractures and postsurgical change in the left hemithorax. Review of the MIP images confirms the above findings. IMPRESSION: 1. No pulmonary embolus. 2. New areas of ground-glass and reticulonodular opacities throughout the right lung, predominantly involving the right upper and middle lobes. This is likely infectious, including atypical viral organisms. 3. Small to moderate right pleural effusion is new from prior exam. 4. Right hilar adenopathy causes narrowing of the right mainstem bronchus. 5. Enlarging mediastinal adenopathy consistent with worsening nodal disease. 6. Enlarging nodules in the right upper lobe. One of the nodules on prior chest CT is obscured on the current exam due to motion. 7. Left lateral ninth rib fracture is new. Osseous metastatic disease involving the sternum and T3 vertebral body, better demonstrated on prior PET again seen. 8. There are multiple low-density lesions in the liver that were not definitively seen on recent prior, and suspicious for metastatic disease. This is not well assessed on this phase of contrast tailored to pulmonary artery assessment. Aortic Atherosclerosis (ICD10-I70.0). Electronically Signed   By: Keith Rake M.D.   On: 04/11/2022 16:25   DG Chest 2 View  Result Date: 04/11/2022 CLINICAL DATA:  Shortness of breath. History of LEFT  pneumonectomy for LEFT lung cancer. EXAM: CHEST - 2 VIEW COMPARISON:  03/17/2022 CT, 06/09/2021 radiograph and prior studies FINDINGS: LEFT pneumonectomy changes again noted. Interstitial and airspace opacities within the RIGHT lung are now noted. A trace RIGHT pleural effusions present. There is no evidence of pneumothorax. No acute bony abnormalities are identified. IMPRESSION: Interstitial and airspace opacities within the RIGHT lung with trace RIGHT pleural effusion. This may represent pulmonary edema or infection. Electronically Signed   By: Margarette Canada M.D.   On: 04/11/2022 14:23     ------------------------------------------------------------------------------------------------------ Assessment/Plan: Principal Problem:   Pneumonia  Multifocal pneumonia SIRS physiology Acute respiratory failure with hypoxia Presented with shortness of breath, hypoxia progressively worsening in 24 hours Mildly tachycardic, no fever, WBC count elevated but patient received Benjamin Case infusion yesterday only.  Lactic acid level normal.  Unable to collect sepsis at this time but at risk of progression to sepsis as patient is immunocompromised. CT chest showed new areas of ground-glass and reticulonodular opacities throughout the right lung, predominantly involving the right upper and middle lobes.  Blood culture sent. Given IV cefepime, IV vancomycin in the ED.  No history of MRSA infection.  Continue IV cefepime. Recent Labs  Lab 04/08/22 1025 04/11/22 1427 04/11/22 1526  WBC 12.5* 36.2*  --   LATICACIDVEN  --   --  1.2   Metastatic lung cancer Patient has history of squamous cell lung cancer s/p left lobectomy (2009) with recurrence noted in Oct 2023  Currently on chemotherapy under the care of [name] 11/2 Thursday, last chemotherapy 11/4, received an injection of Benjamin Case for prevention of decreased white blood cell count. CT chest today showed suspicion of metastasis as evidenced by multiple  low-density lesions in  the liver, osseous metastasis to the sternum and T3 vertebral body, enlarging mediastinal lymphadenopathy and right hilar adenopathy causing narrowing of the right mainstem bronchus. Oncology to follow-up  Moderate right pleural effusion Parapneumonic effusion versus malignant effusion. Respiratory status does not improve, may benefit from thoracentesis.  COPD Current everyday smoker Continue incentive spirometry, Mucinex, bronchodilators He continues to smoke 1 pack/day, has been smoking for last 50 years. Counseled to quit.  Nicotine patch offered.  Elevated troponin Elevated BNP Troponin and BNP both elevated over 500. EKG without ischemic changes. EDP discussed with cardiologist Dr. Johney Frame.  Abnormal labs likely because of underlying respiratory disease.  No cardiac symptoms. Continue to monitor.  Type 2 diabetes mellitus A1c 5.8 in 2020.  Diet controlled. Currently not on meds. No results for input(s): "GLUCAP" in the last 168 hours.  Chronic alcohol use drinks 2-3 mixed alcoholic drinks on a daily basis Patient is not concerned about withdrawal symptoms.  Watch out for withdrawal symptoms.+  Anxiety  GERD  treated hep C  peripheral neuropathy   Osteoarthritis  Mobility Encourage ambulation.  Check oxygen requirement on ambulation   Goals of care - -  Code Status: Prior full code  Diet:  Diet Order     None      DVT prophylaxis: Lovenox subcu   Antimicrobials: IV cefepime Fluid: Start gentle IV hydration with normal saline at 75 mill per hour.  Looks a little dry. Consultants: Cardiology, oncology Family Communication: Wife at bedside  Dispo: The patient is from: Home              Anticipated d/c is to: Depends on clinical course    ------------------------------------------------------------------------------------- Severity of Illness: The appropriate patient status for this patient is INPATIENT. Inpatient status is  judged to be reasonable and necessary in order to provide the required intensity of service to ensure the patient's safety. The patient's presenting symptoms, physical exam findings, and initial radiographic and laboratory data in the context of their chronic comorbidities is felt to place them at high risk for further clinical deterioration. Furthermore, it is not anticipated that the patient will be medically stable for discharge from the hospital within 2 midnights of admission.   * I certify that at the point of admission it is my clinical judgment that the patient will require inpatient hospital care spanning beyond 2 midnights from the point of admission due to high intensity of service, high risk for further deterioration and high frequency of surveillance required.*  -------------------------------------------------------------------------------------  Cline Cools, MD [facility] 04/11/2022

## 2022-04-11 NOTE — ED Provider Notes (Signed)
[facility] DEPT Provider Note   CSN: [id number] Arrival date & time: 04/11/22  1325     History  Chief Complaint  Patient presents with   Shortness of Breath    Benjamin Case is a 66 y.o. male.  66 year old male with prior medical history as detailed below presents for evaluation.  Patient is known to Dr. Earlie Server with oncology.  Patient reports recent initiation of chemotherapy in the last week.  Yesterday he received an infusion for prevention of decreased white blood cell count.  Patient is complains today of worsening shortness of breath over the last 24 hours.  Patient denies cough.  He denies fever.  He denies pain.  Patient reports that at home his pulse ox on room air was as low as 80%.  He does not use supplemental O2 at baseline.    On arrival patient's room air sats were noted in the 80 to 84% range.  With supplemental O2 at 4 L the patient's O2 sats are greater than 95%.  Patient reports that he feels improved with supplemental O2.  Recent HPI from Dr. Earlie Server copied below:  HPI Benjamin Case is a 66 y.o. male with past medical history significant for stage IIIa (T2, N2, M0) non-small cell lung cancer, squamous cell carcinoma diagnosed in June 2009 status post left pneumonectomy with lymph node dissection on December 28, 2007 by Dr. Jearld Fenton followed by 4 cycles of adjuvant systemic chemotherapy with cisplatin and docetaxel completed November 2009.  The patient has been on observation since that time and he was discharged from the clinic in 2017 after almost 8 years of no evidence for disease recurrence.  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  He has a long history of smoking.  He was seen by his primary care physician in November 2022 and he had repeat CT scan of the chest on April 29, 2021 for evaluation of his cancer and it showed irregular solid 1.1 cm  posterior apical right upper lobe pulmonary nodule that is new and worrisome for metachronous primary bronchogenic carcinoma.  There was a separate and distinct apical right upper lobe 0.6 cm nodule that is also new and no thoracic adenopathy or other potential findings for metastatic disease.  The patient had a PET scan on May 13, 2021 and the right upper lobe pulmonary nodule was not hypermetabolic but required surveillance.  There was no enlarged or hypermetabolic mediastinal or hilar lymph nodes.  He had bronchoscopy with navigational bronchoscopy under the care of Dr. Valeta Harms on July 06, 2021 and the biopsy as well as the brushing of the right upper lobe nodule was negative and showing benign reactive changes with no malignant cells identified.  The patient was followed by observation and repeat CT scan of the chest with contrast on December 15, 2021 showed new right paratracheal adenopathy consistent with metastatic adenopathy.  There was cavitary enlarged right upper lobe pulmonary nodule highly concerning for bronchogenic carcinoma and a second smaller right upper lobe nodule that is indeterminate.  The patient had a PET scan on March 12, 2022 and that showed solid pulmonary nodules of the right upper lobe measuring 0.9 cm with SUV max of 2.4 and 0.9 cm with SUV max of 2.3 unchanged in size compared to the prior CT scan of the chest.  There was also hypermetabolic mediastinal and right hilar lymph nodes including right lower paratracheal lymph  node measuring 1.5 cm in short axis with SUV max of 5.4, right hilar lymph node measuring 1.0 cm with SUV max of 5.6 and subcarinal lymph node measuring 1.8 cm with SUV max of 3.1.  The abdominal and pelvic portion showed hypermetabolic lymph node located posterior to the left psoas muscle measuring 0.6 cm with SUV max of 2.9.  There was also sclerotic areas of focal hypermetabolic activity seen within the musculature of the bilateral buttocks including right  buttocks with SUV of 2.9 and hypermetabolic uptake in the left iliopsoas muscle with SUV of 2.9.  The skeleton showed numerous hypermetabolic osseous lesions involving the manubrium, right scapula, thoracolumbar spine, bilateral ribs and pelvis.  On March 23, 2022 the patient underwent video bronchoscopy with endobronchial ultrasound procedure by Dr. Valeta Harms. The final pathology ([id number]) showed malignant cells consistent with non-small cell carcinoma. Appropriately controlled mmunohistochemical stains reveal the cells are positive for CK7 and p40, and negative for TTF-1, Napsin A and CK20. The findings are compatible with involvement by the patient's known squamous cell carcinoma.  The patient was referred to me today for evaluation and recommendation regarding treatment of his condition. When seen today he is feeling fine except for the soreness and shortness of breath after the biopsy.  He also has cough productive of clear sputum with no significant hemoptysis.  He has no nausea, vomiting, diarrhea or constipation.  He lost around 20 pounds in the last 4 months.  He has no significant headache or visual changes. Family history significant for mother who died from lung cancer and she was my patient for several years.  Father is unknown. The patient is married and has 2 biological children and 3 stepchildren.  He is currently unemployed and used to work in [employer].  He has a history for smoking 1 pack/day for around 50 years and unfortunately continues to smoke.  He also drinks 2-3 mixed alcoholic drinks on daily basis.  He has no history of drug abuse.   The history is provided by the patient and medical records.       Home Medications Prior to Admission medications   Medication Sig Start Date End Date Taking? Authorizing Provider  ALPRAZolam Duanne Moron) 1 MG tablet Take 1 mg by mouth 3 (three) times daily as needed for anxiety. 04/20/15   [provider]   Ascorbic Acid (VITAMIN C) 1000 MG tablet Take 1,000 mg by mouth daily.    [provider]  aspirin EC 81 MG tablet Take 81 mg by mouth daily.    [provider]  Aspirin-Caffeine (BC FAST PAIN RELIEF) 845-65 MG PACK Take 1 packet by mouth daily as needed (pain.).    [provider]  atorvastatin (LIPITOR) 10 MG tablet Take 10 mg by mouth at bedtime.     [provider]  B Complex-C (SUPER B COMPLEX PO) Take 1 tablet by mouth daily.    [provider]  esomeprazole (NEXIUM) 40 MG capsule Take 40 mg by mouth daily as needed (for heartburn).     [provider]  guaiFENesin (MUCINEX) 600 MG 12 hr tablet Take 600 mg by mouth 2 (two) times daily.    [provider]  ipratropium-albuterol (DUONEB) 0.5-2.5 (3) MG/3ML SOLN Take 3 mLs by nebulization 2 (two) times daily.    [provider]  meloxicam (MOBIC) 15 MG tablet Take 15 mg by mouth daily. 03/07/22   [provider]  methocarbamol (ROBAXIN) 500 MG tablet Take 500 mg  by mouth 2 (two) times daily as needed for muscle spasms. 03/12/22   [provider]  Omega-3 Fatty Acids ([employer] FISH OIL) 1000 MG CAPS Take 1,000 mg by mouth every evening.    [provider]  oxyCODONE-acetaminophen (PERCOCET) 10-325 MG tablet Take 1 tablet by mouth 5 (five) times daily as needed for pain. 05/04/21   [provider]  pregabalin (LYRICA) 75 MG capsule Take 75 mg by mouth 2 (two) times daily. 02/22/22   [provider]  prochlorperazine (COMPAZINE) 10 MG tablet Take 1 tablet (10 mg total) by mouth every 6 (six) hours as needed for nausea or vomiting. 04/01/22   Curt Bears, MD  vitamin B-12 (CYANOCOBALAMIN) 1000 MCG tablet Take 1,000 mcg by mouth daily.    [provider]      Allergies    Doxepin hcl    Review of Systems   Review of Systems  All other systems reviewed and are negative.   Physical Exam Updated Vital Signs BP 109/69 (BP  Location: Right Arm)   Pulse (!) 116   Temp 98.7 F (37.1 C) (Oral)   Ht 5\' 10"  (1.778 m)   Wt 80.7 kg   SpO2 96%   BMI 25.54 kg/m  Physical Exam Vitals and nursing note reviewed.  Constitutional:      General: He is not in acute distress.    Appearance: Normal appearance. He is well-developed.  HENT:     Head: Normocephalic and atraumatic.  Eyes:     Conjunctiva/sclera: Conjunctivae normal.     Pupils: Pupils are equal, round, and reactive to light.  Cardiovascular:     Rate and Rhythm: Normal rate and regular rhythm.     Heart sounds: Normal heart sounds.  Pulmonary:     Effort: Pulmonary effort is normal. No respiratory distress.     Comments: No breath sounds on left, patient is status post left lung resection  Decreased breath sounds with scattered trace wheezes across the right lung field. Abdominal:     General: There is no distension.     Palpations: Abdomen is soft.     Tenderness: There is no abdominal tenderness.  Musculoskeletal:        General: No deformity. Normal range of motion.     Cervical back: Normal range of motion and neck supple.  Skin:    General: Skin is warm and dry.  Neurological:     General: No focal deficit present.     Mental Status: He is alert and oriented to person, place, and time.     ED Results / Procedures / Treatments   Labs (all labs ordered are listed, but only abnormal results are displayed) Labs Reviewed  CBC WITH DIFFERENTIAL/PLATELET - Abnormal; Notable for the following components:      Result Value   WBC 36.2 (*)    RBC 4.00 (*)    MCH 34.3 (*)    RDW 11.3 (*)    Neutro Abs 32.3 (*)    Abs Immature Granulocytes 2.47 (*)    All other components within normal limits  COMPREHENSIVE METABOLIC PANEL - Abnormal; Notable for the following components:   Sodium 128 (*)    Chloride 91 (*)    Glucose, Bld 158 (*)    BUN 31 (*)    Calcium 8.4 (*)    AST 44 (*)    All other components within normal limits  BRAIN  NATRIURETIC PEPTIDE - Abnormal; Notable for the following components:   B  Natriuretic Peptide 552.8 (*)    All other components within normal limits  TROPONIN I (HIGH SENSITIVITY) - Abnormal; Notable for the following components:   Troponin I (High Sensitivity) 529 (*)    All other components within normal limits  RESP PANEL BY RT-PCR (FLU A&B, COVID) ARPGX2  CULTURE, BLOOD (ROUTINE X 2)  CULTURE, BLOOD (ROUTINE X 2)  LACTIC ACID, PLASMA  LACTIC ACID, PLASMA  TROPONIN I (HIGH SENSITIVITY)    EKG EKG Interpretation  Date/Time:  Sunday April 11 2022 14:17:25 EST Ventricular Rate:  114 PR Interval:  121 QRS Duration: 90 QT Interval:  343 QTC Calculation: 473 R Axis:   113 Text Interpretation: Sinus tachycardia Right axis deviation Borderline T abnormalities, diffuse leads Confirmed by Dene Gentry [phone]) on 04/11/2022 2:29:35 PM  Radiology CT Angio Chest PE W and/or Wo Contrast  Result Date: 04/11/2022 CLINICAL DATA:  Pulmonary embolism (PE) suspected, unknown D-dimer Shortness of breath. Radiologic records indicates history of lung cancer, prior pneumonectomy. EXAM: CT ANGIOGRAPHY CHEST WITH CONTRAST TECHNIQUE: Multidetector CT imaging of the chest was performed using the standard protocol during bolus administration of intravenous contrast. Multiplanar CT image reconstructions and MIPs were obtained to evaluate the vascular anatomy. RADIATION DOSE REDUCTION: This exam was performed according to the departmental dose-optimization program which includes automated exposure control, adjustment of the mA and/or kV according to patient size and/or use of iterative reconstruction technique. CONTRAST:  80mL OMNIPAQUE IOHEXOL 350 MG/ML SOLN COMPARISON:  Chest radiograph earlier today. CT 03/17/2022. PET CT 03/12/2022 FINDINGS: Cardiovascular: There are no filling defects within the pulmonary arteries to suggest pulmonary embolus. Chronic central enlargement of the main pulmonary artery.  Grossly normal heart size, shifted to the left due to pneumonectomy changes. There are coronary artery calcifications. Atherosclerosis of the thoracic aorta without aneurysm or dissection. Mediastinum/Nodes: Enlarging precarinal node measuring 2.8 x 4.2 cm, series 5, image 52, previously 3.6 x 3 cm. 19 mm subcarinal node series 5, image 65, previously 18 mm. There is a 2 x 4 cm right hilar node series 5, image 66, comparison with prior is limited due to lack contrast on that exam. Low right hilar nodes measure up to 12 mm. Scattered retained debris in the esophagus. Stable volume loss in the left hemithorax post pneumonectomy with mediastinal shift. 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 Small to moderate-sized right pleural effusion is new from prior exam. There are new areas of ground-glass and reticulonodular opacities throughout the right lung, predominantly involving the right upper and middle lobes. Previous right upper lobe cavitary nodule is not well assessed on the current exam due to motion, in the region of series 11, image 44. r medial right apical nodule has increased in size 13 x 9 mm series 11, image 34, previously 10 x 7 mm. Enlarging right apical nodule at 8 mm series 11, image 27, previously 4 mm. There is narrowing of the right mainstem bronchus with middle and lower lobe bronchial thickening. Occasional areas of mucoid impaction in the lower lobe. Breathing motion artifact limits detailed assessment. Upper Abdomen: Low-density liver lesions in the left lobe is unchanged from recent chest CT, series 5, image 125 there are multiple additional low-density lesions in the right and left hepatic lobe. These are not definitively seen on recent CT under typical of metastatic disease. Musculoskeletal: Patient with known osseous metastatic disease. Lucency involving the posterior aspect of T3 vertebral body, series 8, image 111. Lucent lesion in the  manubrium is again  seen. Left lateral ninth rib fracture is new. There are multiple remote left rib fractures and postsurgical change in the left hemithorax. Review of the MIP images confirms the above findings. IMPRESSION: 1. No pulmonary embolus. 2. New areas of ground-glass and reticulonodular opacities throughout the right lung, predominantly involving the right upper and middle lobes. This is likely infectious, including atypical viral organisms. 3. Small to moderate right pleural effusion is new from prior exam. 4. Right hilar adenopathy causes narrowing of the right mainstem bronchus. 5. Enlarging mediastinal adenopathy consistent with worsening nodal disease. 6. Enlarging nodules in the right upper lobe. One of the nodules on prior chest CT is obscured on the current exam due to motion. 7. Left lateral ninth rib fracture is new. Osseous metastatic disease involving the sternum and T3 vertebral body, better demonstrated on prior PET again seen. 8. There are multiple low-density lesions in the liver that were not definitively seen on recent prior, and suspicious for metastatic disease. This is not well assessed on this phase of contrast tailored to pulmonary artery assessment. Aortic Atherosclerosis (ICD10-I70.0). Electronically Signed   By: Keith Rake M.D.   On: 04/11/2022 16:25   DG Chest 2 View  Result Date: 04/11/2022 CLINICAL DATA:  Shortness of breath. History of LEFT pneumonectomy for LEFT lung cancer. EXAM: CHEST - 2 VIEW COMPARISON:  03/17/2022 CT, 06/09/2021 radiograph and prior studies FINDINGS: LEFT pneumonectomy changes again noted. Interstitial and airspace opacities within the RIGHT lung are now noted. A trace RIGHT pleural effusions present. There is no evidence of pneumothorax. No acute bony abnormalities are identified. IMPRESSION: Interstitial and airspace opacities within the RIGHT lung with trace RIGHT pleural effusion. This may represent pulmonary edema or infection.  Electronically Signed   By: Margarette Canada M.D.   On: 04/11/2022 14:23    Procedures Procedures    Medications Ordered in ED Medications - No data to display  ED Course/ Medical Decision Making/ A&P                           Medical Decision Making Amount and/or Complexity of Data Reviewed Labs: ordered. Radiology: ordered.  Risk Prescription drug management. Decision regarding hospitalization.    Medical Screen Complete  This patient presented to the ED with complaint of dyspnea.  This complaint involves an extensive number of treatment options. The initial differential diagnosis includes, but is not limited to, infection, metabolic abnormality, pleural effusion, PE, etc.  This presentation is: Acute, Chronic, Self-Limited, Previously Undiagnosed, Uncertain Prognosis, Complicated, Systemic Symptoms, and Threat to Life/Bodily Function  Patient with advanced lung cancer presents with complaint of increasing shortness of breath over last 24 to 48 hours.  Patient with new O2 requirement.  Initial imaging is concerning for possible infiltrates and pneumonia.  Patient is improved with supplemental O2 at 4 L nasal cannula.  Work-up is concerning given elevated white count, history of immunosuppression, elevated troponin.  Cardiology is aware of case.  Dr. Johney Frame with cardiology recommends trending of troponin.  Patient without active chest pain.  Patient's EKG is without acute ischemia.  Patient would benefit from admission.  Broad-spectrum antibiotics initiated here in the ED.  Hospitalist service made aware of case and will evaluate for admission.   Additional history obtained:  External records from outside sources obtained and reviewed including prior ED visits and prior Inpatient records.    Lab Tests:  I ordered and personally interpreted labs.  The pertinent results include:  CBC  CMP BNP Lactic Acid Trop Covid flu   Imaging Studies ordered:  I ordered  imaging studies including CXR, CT chest  I independently visualized and interpreted obtained imaging which showed likely infiltrates I agree with the radiologist interpretation.   Cardiac Monitoring:  The patient was maintained on a cardiac monitor.  I personally viewed and interpreted the cardiac monitor which showed an underlying rhythm of: Sinus Tach   Medicines ordered:  I ordered medication including abx  for infection  Reevaluation of the patient after these medicines showed that the patient: improved   Problem List / ED Course:  Dyspnea   Reevaluation:  After the interventions noted above, I reevaluated the patient and found that they have: improved   Disposition:  After consideration of the diagnostic results and the patients response to treatment, I feel that the patent would benefit from admission.          Final Clinical Impression(s) / ED Diagnoses Final diagnoses:  Dyspnea, unspecified type    Rx / DC Orders ED Discharge Orders     None         Valarie Merino, MD 04/11/22 1655

## 2022-04-12 DIAGNOSIS — C3491 Malignant neoplasm of unspecified part of right bronchus or lung: Secondary | ICD-10-CM | POA: Diagnosis not present

## 2022-04-12 LAB — CBC
HCT: 35.8 % — ABNORMAL LOW (ref 39.0–52.0)
Hemoglobin: 12.4 g/dL — ABNORMAL LOW (ref 13.0–17.0)
MCH: 34.2 pg — ABNORMAL HIGH (ref 26.0–34.0)
MCHC: 34.6 g/dL (ref 30.0–36.0)
MCV: 98.6 fL (ref 80.0–100.0)
Platelets: 181 10*3/uL (ref 150–400)
RBC: 3.63 MIL/uL — ABNORMAL LOW (ref 4.22–5.81)
RDW: 11.3 % — ABNORMAL LOW (ref 11.5–15.5)
WBC: 32.6 10*3/uL — ABNORMAL HIGH (ref 4.0–10.5)
nRBC: 0 % (ref 0.0–0.2)

## 2022-04-12 LAB — BASIC METABOLIC PANEL
Anion gap: 8 (ref 5–15)
BUN: 22 mg/dL (ref 8–23)
CO2: 25 mmol/L (ref 22–32)
Calcium: 8 mg/dL — ABNORMAL LOW (ref 8.9–10.3)
Chloride: 97 mmol/L — ABNORMAL LOW (ref 98–111)
Creatinine, Ser: 0.9 mg/dL (ref 0.61–1.24)
GFR, Estimated: >60 mL/min (ref >60)
Glucose, Bld: 140 mg/dL — ABNORMAL HIGH (ref 70–99)
Potassium: 4 mmol/L (ref 3.5–5.1)
Sodium: 130 mmol/L — ABNORMAL LOW (ref 135–145)

## 2022-04-12 LAB — TROPONIN I (HIGH SENSITIVITY): Troponin I (High Sensitivity): 437 ng/L (ref <18)

## 2022-04-12 LAB — PROCALCITONIN: Procalcitonin: 0.29 ng/mL

## 2022-04-12 LAB — EXPECTORATED SPUTUM ASSESSMENT W GRAM STAIN, RFLX TO RESP C

## 2022-04-12 LAB — HIV ANTIBODY (ROUTINE TESTING W REFLEX): HIV Screen 4th Generation wRfx: NONREACTIVE

## 2022-04-12 MED ORDER — LORAZEPAM 2 MG/ML IJ SOLN: 1.0000 mg | INTRAMUSCULAR | Status: AC | PRN

## 2022-04-12 MED ORDER — OXYCODONE-ACETAMINOPHEN 5-325 MG PO TABS
1.0000 | ORAL_TABLET | Freq: Four times a day (QID) | ORAL | Status: DC | PRN
  Administered 2022-04-12 – 2022-04-13 (×4): 1 via ORAL
  Filled 2022-04-12 (×4): qty 1

## 2022-04-12 MED ORDER — THIAMINE MONONITRATE 100 MG PO TABS
100.0000 mg | ORAL_TABLET | Freq: Every day | ORAL | Status: DC
  Administered 2022-04-12 – 2022-04-17 (×5): 100 mg via ORAL
  Filled 2022-04-12 (×6): qty 1

## 2022-04-12 MED ORDER — LORAZEPAM 1 MG PO TABS: 1.0000 mg | ORAL_TABLET | ORAL | Status: AC | PRN

## 2022-04-12 MED ORDER — THIAMINE HCL 100 MG/ML IJ SOLN
100.0000 mg | Freq: Every day | INTRAMUSCULAR | Status: DC
  Administered 2022-04-14: 100 mg via INTRAVENOUS
  Filled 2022-04-12 (×3): qty 2

## 2022-04-12 MED ORDER — ADULT MULTIVITAMIN W/MINERALS CH
1.0000 | ORAL_TABLET | Freq: Every day | ORAL | Status: DC
  Administered 2022-04-12 – 2022-04-17 (×6): 1 via ORAL
  Filled 2022-04-12 (×6): qty 1

## 2022-04-12 MED ORDER — OXYCODONE HCL 5 MG PO TABS
5.0000 mg | ORAL_TABLET | Freq: Four times a day (QID) | ORAL | Status: DC | PRN
  Administered 2022-04-12 – 2022-04-13 (×4): 5 mg via ORAL
  Filled 2022-04-12 (×4): qty 1

## 2022-04-12 MED ORDER — OXYCODONE-ACETAMINOPHEN 10-325 MG PO TABS: 1.0000 | ORAL_TABLET | Freq: Four times a day (QID) | ORAL | Status: DC | PRN

## 2022-04-12 MED ORDER — DILTIAZEM HCL-DEXTROSE 125-5 MG/125ML-% IV SOLN (PREMIX)
5.0000 mg/h | INTRAVENOUS | Status: DC
  Administered 2022-04-12: 5 mg/h via INTRAVENOUS
  Administered 2022-04-13 (×2): 15 mg/h via INTRAVENOUS
  Administered 2022-04-14: 10 mg/h via INTRAVENOUS
  Administered 2022-04-14 (×2): 15 mg/h via INTRAVENOUS
  Administered 2022-04-15: 10 mg/h via INTRAVENOUS
  Filled 2022-04-12 (×8): qty 125

## 2022-04-12 MED ORDER — IPRATROPIUM-ALBUTEROL 0.5-2.5 (3) MG/3ML IN SOLN
3.0000 mL | Freq: Four times a day (QID) | RESPIRATORY_TRACT | Status: DC
  Administered 2022-04-12 – 2022-04-17 (×21): 3 mL via RESPIRATORY_TRACT
  Filled 2022-04-12 (×21): qty 3

## 2022-04-12 MED ORDER — DILTIAZEM LOAD VIA INFUSION
15.0000 mg | Freq: Once | INTRAVENOUS | Status: AC
  Administered 2022-04-12: 15 mg via INTRAVENOUS
  Filled 2022-04-12: qty 15

## 2022-04-12 MED ORDER — ENOXAPARIN SODIUM 100 MG/ML IJ SOSY
1.0000 mg/kg | PREFILLED_SYRINGE | Freq: Two times a day (BID) | INTRAMUSCULAR | Status: DC
  Administered 2022-04-12 – 2022-04-14 (×4): 90 mg via SUBCUTANEOUS
  Filled 2022-04-12 (×4): qty 1

## 2022-04-12 MED ORDER — FOLIC ACID 1 MG PO TABS
1.0000 mg | ORAL_TABLET | Freq: Every day | ORAL | Status: DC
  Administered 2022-04-12 – 2022-04-17 (×6): 1 mg via ORAL
  Filled 2022-04-12 (×6): qty 1

## 2022-04-12 NOTE — Plan of Care (Signed)

## 2022-04-12 NOTE — Significant Event (Signed)
Patient is in new onset A-fib with RVR from this afternoon. No history of such.  Not on any AV nodal blocking agent at home. Asymptomatic.  Heart rate in the 140s.  Twelve-lead EKG obtained. History of alcohol use daily.  Denies withdrawal symptoms in the past.  Mental status intact. Start on Cardizem drip, switch from DVT prophylaxis to full dose Lovenox Obtain echocardiogram She reported with as needed IV Ativan.  If continues need frequent IV Ativan, will add scheduled Librium.  Communicated with RN

## 2022-04-12 NOTE — Progress Notes (Signed)
Patient switched into an irregular heart rhythm on the monitor around 1650. EKG obtained by RN which showed Afib RVR. BP stable at this time. Pt denies increased SOB or chest pain. Dr Pietro Cassis paged by this RN, and orders received to initiate Cardizem gtt with bolus. This RN will continue to carefully monitor pt for changes in hemodynamic status.

## 2022-04-12 NOTE — Progress Notes (Signed)
ANTICOAGULATION CONSULT NOTE - Initial Consult  Pharmacy Consult for Enoxaparin Indication: atrial fibrillation  No Known Allergies  Patient Measurements: Height: 5\' 10"  (177.8 cm) Weight: 89.2 kg (196 lb 10.4 oz) IBW/kg (Calculated) : 73   Vital Signs: Temp: 98.2 F (36.8 C) (11/06 1200) Temp Source: Oral (11/06 1200) BP: 153/88 (11/06 1700) Pulse Rate: 147 (11/06 1700)  Labs: Recent Labs    04/11/22 1427 04/11/22 1839 04/12/22 0308  HGB 13.7  --  12.4*  HCT 39.7  --  35.8*  PLT 215  --  181  CREATININE 1.21  --  0.90  TROPONINIHS 529* 594* 437*    Estimated Creatinine Clearance: 88.3 mL/min (by C-G formula based on SCr of 0.9 mg/dL).   Medical History: Past Medical History:  Diagnosis Date   Arthritis    back   COPD (chronic obstructive pulmonary disease) ([facility])    Diabetes mellitus without complication (H[facility])    no medications , monitors cbg at home with monitor, avg no more than 150s at home    Dyspnea    increased exertion; pt states can climb flight of stairs w/o having to stop prior to reaching top ; ongoing reports it is due to "having one lung"    GERD (gastroesophageal reflux disease)    Hepatitis    hepatitis C; pt states competed treatments, unsure what medication he was treated with    History of chemotherapy    Hypertension    reports its been over a year since he stopped taking bp medication, reports he made her medical doctor aware that he was topping it    lung ca dx'd 12/2007   lt lobectomy   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 Patient developed new onset a-fib with RVR this afternoon. Pharmacy consulted for Enoxaparin dosing for a-fib (full dose). Patient not on anticoagulation PTA. Last dose of  Enoxaparin 40mg  SQ (for prophylaxis) given 04/11/22 at 2100. SCr 0.9, CrCl ~ 88 ml/min. CBC: Hgb 12.4, Pltc 181K.   Goal of Therapy:  Anti-Xa level 0.6-1 units/ml 4hrs after LMWH dose given Monitor platelets by anticoagulation protocol: Yes   Plan:  Enoxaparin 1mg /kg SQ q12h Monitor renal function, CBC at least q72h, s/sx of bleeding   Lindell Spar, PharmD, BCPS Clinical Pharmacist 04/12/2022,5:08 PM

## 2022-04-12 NOTE — Progress Notes (Signed)
PROGRESS NOTE  Benjamin Case  DOB: 06-09-1955  PCP: [name], MD XLK:[id number]  DOA: 04/11/2022  LOS: 1 day  Hospital Day: 2  Brief narrative: Benjamin Case is a 66 y.o. 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, DM2, HTN, COPD, GERD, treated hep C, peripheral neuropathy, arthritis. Patient presented to the ED this afternoon with complaint of worsening shortness of breath over the last 24 hours, no fever, no cough, no pain.  At home, his O2 sat was at 80% on room air.  Not on supplemental oxygen at baseline. He continues to smoke 1 pack/day, has been smoking for last 50 years.  He also drinks 2-3 mixed alcoholic drinks on a daily basis.  No history of drug abuse. Follows up with [name]. 11/2 Thursday, last chemotherapy 11/4, received an injection of GM-CSF for prevention of decreased white blood cell count.  In the ED, he was afebrile, heart rate in 100s, blood pressure in 100s, O2 sat 80 to 84% on room air and improved to more than 90% on 4 L oxygen by nasal cannula. Labs with WC count elevated to 36.2, lactic acid normal at 1.2, sodium 128, BUN/creatinine 31/1.21, BNP 553, troponin 529 COVID PCR/influenza negative Blood culture sent CT angio chest findings as below compared to CT scan from a month ago.  1. No pulmonary embolus. 2. New areas of ground-glass and reticulonodular opacities throughout the right lung, predominantly involving the right upper and middle lobes. This is likely infectious, including atypical viral organisms. 3. Small to moderate right pleural effusion 4. Right hilar adenopathy causes narrowing of the right mainstem bronchus. 5. Enlarging mediastinal adenopathy consistent with worsening nodal disease. 6. Enlarging nodules in the right upper lobe.  7. Left lateral ninth rib fracture is new. Osseous metastatic  disease involving the sternum and T3 vertebral body 8. There are multiple low-density lesions in the liver suspicious for metastatic  disease.   Patient was started on IV antibiotic coverage with IV cefepime, IV vancomycin. Admitted to [facility]   Subjective: Patient was seen and examined this morning.  Pleasant elderly Caucasian male.  Propped up in bed.  Not in distress.  Did not have good sleep last night because of disturbances and machines beeping. Wife at bedside.  Assessment/Plan: Multifocal pneumonia SIRS physiology Acute respiratory failure with hypoxia Presented with shortness of breath, hypoxia progressively worsening in 24 hours Admission he was mildly tachycardic, no fever, WBC count elevated but patient received GM-CSF infusion the day prior only.  Lactic acid level normal.  CT chest showed new areas of ground-glass and reticulonodular opacities throughout the right lung, predominantly involving the right upper and middle lobes.  Blood culture sent.  Pending report. Currently on IV cefepime.  Continue the same On 4 L oxygen which is elevated check ambulatory oxygen requirement. Recent Labs  Lab 04/08/22 1025 04/11/22 1427 04/11/22 1526 04/11/22 1839 04/12/22 0308  WBC 12.5* 36.2*  --   --  32.6*  LATICACIDVEN  --   --  1.2 2.1*  --   PROCALCITON  --   --   --  0.27 0.29   Metastatic lung cancer Patient has history of squamous cell lung cancer s/p left lobectomy (2009) with recurrence noted in Oct 2023  Currently on chemotherapy under the care of [name] 11/2 Thursday, last chemotherapy 11/4, received an injection of GM-CSF for prevention  of decreased white blood cell count. CT chest on admission showed suspicion of metastasis as evidenced by multiple low-density lesions in the liver, osseous metastasis to the sternum and T3 vertebral body, enlarging mediastinal lymphadenopathy and right hilar adenopathy causing narrowing of the right mainstem bronchus. Oncology to  follow-up  Moderate right pleural effusion Parapneumonic effusion versus malignant effusion. Respiratory status does not improve, may benefit from thoracentesis.  But he only has right lung and is at risk of significant respiratory failure with any complication. We will avoid thoracentesis for now.  COPD Current everyday smoker Continue incentive spirometry, Mucinex, bronchodilators He continues to smoke 1 pack/day, has been smoking for last 50 years. Counseled to quit.  Nicotine patch offered.  Elevated troponin Elevated BNP Troponin and BNP both elevated over 500. EKG without ischemic changes. EDP discussed with cardiologist Dr. Johney Frame.  Abnormal labs likely because of underlying respiratory disease.  No cardiac symptoms. Troponin level downtrending. Continue to monitor. Obtain echocardiogram to look for any wall motion abnormality. Recent Labs    04/11/22 1427 04/11/22 1839 04/12/22 0308  TROPONINIHS 529* 594* 437*   Type 2 diabetes mellitus A1c 5.8 in 2020.  Diet controlled. Currently not on meds. No results for input(s): "GLUCAP" in the last 168 hours.  Chronic alcohol use drinks 2-3 mixed alcoholic drinks on a daily basis Patient is not concerned about withdrawal symptoms.  Watch out for withdrawal symptoms.  Mobility Encourage ambulation.  Check oxygen requirement on ambulation   Goals of care - -  Code Status: Full Code full code  Diet:  Diet Order             Diet Heart Room service appropriate? Yes; Fluid consistency: Thin  Diet effective now                  DVT prophylaxis: Lovenox subcuenoxaparin (LOVENOX) injection 40 mg Start: 04/11/22 2200   Antimicrobials: IV cefepime Fluid: NS at 100 mill per hour Consultants: Cardiology, oncology Family Communication: Wife at bedside   Status is: Inpatient  Continue in-hospital care because: Pending clinical improvement Level of care: Stepdown   Dispo: The patient is from: Home               Anticipated d/c is to: Hopefully home in 1 to 2 days              Patient currently is not medically stable to d/c.   Difficult to place patient No     Infusions:   sodium chloride 100 mL/hr at 04/12/22 1419   ceFEPime (MAXIPIME) IV Stopped (04/12/22 1353)    Scheduled Meds:  Chlorhexidine Gluconate Cloth  6 each Topical Daily   enoxaparin (LOVENOX) injection  40 mg Subcutaneous Q24H   ipratropium-albuterol  3 mL Nebulization QID   loratadine  10 mg Oral Daily    PRN meds: acetaminophen **OR** acetaminophen, guaiFENesin-dextromethorphan, hydrALAZINE, ipratropium-albuterol, ondansetron **OR** ondansetron (ZOFRAN) IV, mouth rinse, oxyCODONE-acetaminophen **AND** oxyCODONE, zolpidem   Antimicrobials: Anti-infectives (From admission, onward)    Start     Dose/Rate Route Frequency Ordered Stop   04/11/22 2200  ceFEPIme (MAXIPIME) 2 g in sodium chloride 0.9 % 100 mL IVPB        2 g 200 mL/hr over 30 Minutes Intravenous Every 8 hours 04/11/22 1718     04/11/22 1515  ceFEPIme (MAXIPIME) 2 g in sodium chloride 0.9 % 100 mL IVPB        2 g 200 mL/hr over 30 Minutes Intravenous  Once 04/11/22  1502 04/11/22 1632   04/11/22 1515  vancomycin (VANCOREADY) IVPB 1500 mg/300 mL        1,500 mg 150 mL/hr over 120 Minutes Intravenous  Once 04/11/22 1502 04/11/22 1900       Objective: Vitals:   04/12/22 1300 04/12/22 1400  BP: (!) 147/83 126/77  Pulse: (!) 107 (!) 101  Resp: 16 17  Temp:    SpO2: 97% 95%    Intake/Output Summary (Last 24 hours) at 04/12/2022 1543 Last data filed at 04/12/2022 1432 Gross per 24 hour  Intake 2330.85 ml  Output 1125 ml  Net 1205.85 ml   Filed Weights   04/11/22 1339 04/11/22 1813  Weight: 80.7 kg 89.2 kg   Weight change:  Body mass index is 28.22 kg/m.   Physical Exam: General exam: Pleasant, elderly Caucasian male.  Not in pain at this time Skin: No rashes, lesions or ulcers. HEENT: Atraumatic, normocephalic, no obvious bleeding Lungs:  Clear to auscultation on the right side.. S/p prior left lung resection CVS: Heart rate in low 100s, regular rhythm, no murmur GI/Abd soft, nontender, nondistended, bowel sound present CNS: Alert, awake, oriented x3 Psychiatry: Anxious Extremities: No pedal edema, no calf tenderness  Data Review: I have personally reviewed the laboratory data and studies available.  F/u labs ordered Unresulted Labs (From admission, onward)     Start     Ordered   04/13/22 8811  Basic metabolic panel  Tomorrow morning,   R       Question:  Specimen collection method  Answer:  Lab=Lab collect   04/12/22 1543   04/13/22 0500  CBC with Differential/Platelet  Tomorrow morning,   R       Question:  Specimen collection method  Answer:  Lab=Lab collect   04/12/22 1543   04/13/22 0500  Lactic acid, plasma  Tomorrow morning,   R       Question:  Specimen collection method  Answer:  Lab=Lab collect   04/12/22 1543   04/12/22 0830  Expectorated Sputum Assessment w Gram Stain, Rflx to Resp Cult  Once,   R        04/12/22 0830   04/12/22 0500  Procalcitonin  Daily at 5am,   R      04/11/22 1656            Signed, Terrilee Croak, MD [facility] 04/12/2022

## 2022-04-12 NOTE — TOC Initial Note (Signed)
Transition of Care [facility]) - Initial/Assessment Note    Patient Details  Name: Benjamin Case MRN: [id number] Date of Birth: [birth year]  Transition of Care [facility]) CM/SW Contact:    Leeroy Cha, RN Phone Number: 04/12/2022, 9:22 AM  Clinical Narrative:                  Transition of Care [facility]) Screening Note   Patient Details  Name: Benjamin Case Date of Birth: 22-Oct-1955   Transition of Care [facility]) CM/SW Contact:    Leeroy Cha, RN Phone Number: 04/12/2022, 9:22 AM    Transition of Care Department [facility]) has reviewed patient and no TOC needs have been identified at this time. We will continue to monitor patient advancement through interdisciplinary progression rounds. If new patient transition needs arise, please place a TOC consult.    Expected Discharge Plan: Home/Self Care Barriers to Discharge: Continued Medical Work up   Patient Goals and CMS Choice Patient states their goals for this hospitalization and ongoing recovery are:: to go home CMS Medicare.gov Compare Post Acute Care list provided to:: Patient    Expected Discharge Plan and Services Expected Discharge Plan: Home/Self Care   Discharge Planning Services: CM Consult   Living arrangements for the past 2 months: Single Family Home                                      Prior Living Arrangements/Services Living arrangements for the past 2 months: Single Family Home Lives with:: Self Patient language and need for interpreter reviewed:: Yes Do you feel safe going back to the place where you live?: Yes            Criminal Activity/Legal Involvement Pertinent to Current Situation/Hospitalization: No - Comment as needed  Activities of Daily Living Home Assistive Devices/Equipment: Eyeglasses ADL Screening (condition at time of admission) Patient's cognitive ability adequate to safely complete daily activities?: Yes Is the patient deaf or have difficulty hearing?: No Does the  patient have difficulty seeing, even when wearing glasses/contacts?: No Does the patient have difficulty concentrating, remembering, or making decisions?: No Patient able to express need for assistance with ADLs?: Yes Does the patient have difficulty dressing or bathing?: No Independently performs ADLs?: Yes (appropriate for developmental age) Does the patient have difficulty walking or climbing stairs?: No Weakness of Legs: None Weakness of Arms/Hands: None  Permission Sought/Granted                  Emotional Assessment Appearance:: Appears stated age Attitude/Demeanor/Rapport: Engaged Affect (typically observed): Calm Orientation: : Oriented to Self, Oriented to Place, Oriented to  Time, Oriented to Situation Alcohol / Substance Use: Tobacco Use, Alcohol Use (dhistory of smoking, current occasional etoh intake) Psych Involvement: No (comment)  Admission diagnosis:  Pneumonia [J18.9] Dyspnea, unspecified type [R06.00] Patient Active Problem List   Diagnosis Date Noted   Pneumonia 04/11/2022   Metastatic primary lung cancer, right ([place]) 04/11/2022   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[place]) 04/01/2022   Adenopathy    Right upper lobe pulmonary nodule 02/22/2022   Hx of cancer of lung 02/22/2022   Lung nodules 05/11/2021   S/P arthroscopy of right shoulder 12/14/2018   Spinal stenosis, lumbar region with neurogenic claudication 03/24/2016   COPD GOLD III  03/14/2016   Cigarette smoker 03/14/2016   Status post lobectomy of lung 03/12/2016   BACK  PAIN, LUMBAR, CHRONIC 02/14/2009   PERIPHERAL NEUROPATHY 10/24/2008   COLONIC POLYPS, HX OF 10/24/2008   Cancer of left lung parenchyma ([place]) 11/27/2007   HEMOPTYSIS 11/20/2007   Nonspecific (abnormal) findings on radiological and other examination of body structure 11/20/2007   CT, CHEST, ABNORMAL 11/20/2007   PCP:  [name], MD Pharmacy:   [id number] - [place], [place] - [address] [place] [address] Phone: [phone] Fax: [phone]     Social Determinants of Health (SDOH) Interventions    Readmission Risk Interventions   No data to display

## 2022-04-13 ENCOUNTER — Inpatient Hospital Stay (HOSPITAL_COMMUNITY): Payer: Medicare Other

## 2022-04-13 ENCOUNTER — Ambulatory Visit (HOSPITAL_COMMUNITY)
Admission: RE | Admit: 2022-04-13 | Discharge: 2022-04-13 | Disposition: A | Discharge status: Home / Self Care | Payer: Medicare Other | Source: Ambulatory Visit | Attending: Internal Medicine | Admitting: Internal Medicine

## 2022-04-13 DIAGNOSIS — R9431 Abnormal electrocardiogram [ECG] [EKG]: Secondary | ICD-10-CM

## 2022-04-13 DIAGNOSIS — J189 Pneumonia, unspecified organism: Secondary | ICD-10-CM | POA: Diagnosis not present

## 2022-04-13 LAB — BASIC METABOLIC PANEL
Anion gap: 8 (ref 5–15)
BUN: 17 mg/dL (ref 8–23)
CO2: 25 mmol/L (ref 22–32)
Calcium: 8.3 mg/dL — ABNORMAL LOW (ref 8.9–10.3)
Chloride: 95 mmol/L — ABNORMAL LOW (ref 98–111)
Creatinine, Ser: 0.82 mg/dL (ref 0.61–1.24)
GFR, Estimated: >60 mL/min (ref >60)
Glucose, Bld: 141 mg/dL — ABNORMAL HIGH (ref 70–99)
Potassium: 3.8 mmol/L (ref 3.5–5.1)
Sodium: 128 mmol/L — ABNORMAL LOW (ref 135–145)

## 2022-04-13 LAB — CBC WITH DIFFERENTIAL/PLATELET
Abs Immature Granulocytes: 0.5 10*3/uL — ABNORMAL HIGH (ref 0.00–0.07)
Band Neutrophils: 3 %
Basophils Absolute: 0 10*3/uL (ref 0.0–0.1)
Basophils Relative: 0 %
Eosinophils Absolute: 1.1 10*3/uL — ABNORMAL HIGH (ref 0.0–0.5)
Eosinophils Relative: 4 %
HCT: 36 % — ABNORMAL LOW (ref 39.0–52.0)
Hemoglobin: 12.5 g/dL — ABNORMAL LOW (ref 13.0–17.0)
Lymphocytes Relative: 6 %
Lymphs Abs: 1.6 10*3/uL (ref 0.7–4.0)
MCH: 34.2 pg — ABNORMAL HIGH (ref 26.0–34.0)
MCHC: 34.7 g/dL (ref 30.0–36.0)
MCV: 98.4 fL (ref 80.0–100.0)
Metamyelocytes Relative: 2 %
Monocytes Absolute: 0.3 10*3/uL (ref 0.1–1.0)
Monocytes Relative: 1 %
Neutro Abs: 23.2 10*3/uL — ABNORMAL HIGH (ref 1.7–7.7)
Neutrophils Relative %: 84 %
Platelets: 179 10*3/uL (ref 150–400)
RBC: 3.66 MIL/uL — ABNORMAL LOW (ref 4.22–5.81)
RDW: 11.3 % — ABNORMAL LOW (ref 11.5–15.5)
WBC: 26.7 10*3/uL — ABNORMAL HIGH (ref 4.0–10.5)
nRBC: 0 % (ref 0.0–0.2)

## 2022-04-13 LAB — TROPONIN I (HIGH SENSITIVITY): Troponin I (High Sensitivity): 235 ng/L (ref <18)

## 2022-04-13 LAB — ECHOCARDIOGRAM COMPLETE
Area-P 1/2: 5.01 cm2
Height: 70 in
S' Lateral: 3 cm
Single Plane A4C EF: 68.5 %
Weight: 3146.41 oz

## 2022-04-13 LAB — LACTIC ACID, PLASMA: Lactic Acid, Venous: 1 mmol/L (ref 0.5–1.9)

## 2022-04-13 LAB — GLUCOSE, CAPILLARY
Glucose-Capillary: 207 mg/dL — ABNORMAL HIGH (ref 70–99)
Glucose-Capillary: 192 mg/dL — ABNORMAL HIGH (ref 70–99)

## 2022-04-13 LAB — PROCALCITONIN: Procalcitonin: 0.33 ng/mL

## 2022-04-13 LAB — BRAIN NATRIURETIC PEPTIDE: B Natriuretic Peptide: 525.5 pg/mL — ABNORMAL HIGH (ref 0.0–100.0)

## 2022-04-13 MED ORDER — FUROSEMIDE 10 MG/ML IJ SOLN
40.0000 mg | Freq: Once | INTRAMUSCULAR | Status: AC
  Administered 2022-04-13: 40 mg via INTRAVENOUS
  Filled 2022-04-13: qty 4

## 2022-04-13 MED ORDER — INSULIN ASPART 100 UNIT/ML IJ SOLN
0.0000 [IU] | Freq: Every day | INTRAMUSCULAR | Status: DC
  Administered 2022-04-14: 2 [IU] via SUBCUTANEOUS
  Administered 2022-04-15 – 2022-04-16 (×2): 3 [IU] via SUBCUTANEOUS

## 2022-04-13 MED ORDER — ALPRAZOLAM 0.5 MG PO TABS
1.0000 mg | ORAL_TABLET | Freq: Three times a day (TID) | ORAL | Status: DC | PRN
  Administered 2022-04-13 – 2022-04-16 (×7): 1 mg via ORAL
  Filled 2022-04-13 (×7): qty 2

## 2022-04-13 MED ORDER — METHYLPREDNISOLONE SODIUM SUCC 125 MG IJ SOLR
125.0000 mg | Freq: Once | INTRAMUSCULAR | Status: AC
  Administered 2022-04-13: 125 mg via INTRAVENOUS
  Filled 2022-04-13: qty 2

## 2022-04-13 MED ORDER — OXYCODONE HCL 5 MG PO TABS
5.0000 mg | ORAL_TABLET | Freq: Four times a day (QID) | ORAL | Status: DC | PRN
  Administered 2022-04-14 (×3): 5 mg via ORAL
  Filled 2022-04-13 (×3): qty 1

## 2022-04-13 MED ORDER — PERFLUTREN LIPID MICROSPHERE
1.0000 mL | INTRAVENOUS | Status: AC | PRN
  Administered 2022-04-13: 2 mL via INTRAVENOUS

## 2022-04-13 MED ORDER — GUAIFENESIN ER 600 MG PO TB12
600.0000 mg | ORAL_TABLET | Freq: Two times a day (BID) | ORAL | Status: DC
  Administered 2022-04-13 – 2022-04-17 (×8): 600 mg via ORAL
  Filled 2022-04-13 (×8): qty 1

## 2022-04-13 MED ORDER — OXYCODONE-ACETAMINOPHEN 5-325 MG PO TABS
1.0000 | ORAL_TABLET | ORAL | Status: DC | PRN
  Administered 2022-04-13 – 2022-04-14 (×6): 1 via ORAL
  Filled 2022-04-13 (×6): qty 1

## 2022-04-13 MED ORDER — INSULIN ASPART 100 UNIT/ML IJ SOLN
0.0000 [IU] | Freq: Three times a day (TID) | INTRAMUSCULAR | Status: DC
  Administered 2022-04-15: 2 [IU] via SUBCUTANEOUS
  Administered 2022-04-15: 1 [IU] via SUBCUTANEOUS
  Administered 2022-04-15: 2 [IU] via SUBCUTANEOUS
  Administered 2022-04-16: 3 [IU] via SUBCUTANEOUS
  Administered 2022-04-16: 2 [IU] via SUBCUTANEOUS
  Administered 2022-04-17: 3 [IU] via SUBCUTANEOUS
  Administered 2022-04-17: 1 [IU] via SUBCUTANEOUS

## 2022-04-13 MED ORDER — METHYLPREDNISOLONE SODIUM SUCC 40 MG IJ SOLR
40.0000 mg | INTRAMUSCULAR | Status: DC
  Administered 2022-04-14 – 2022-04-17 (×4): 40 mg via INTRAVENOUS
  Filled 2022-04-13 (×4): qty 1

## 2022-04-13 NOTE — Progress Notes (Signed)
  Echocardiogram 2D Echocardiogram has been performed.  Benjamin Case 04/13/2022, 3:01 PM

## 2022-04-13 NOTE — Progress Notes (Addendum)
PROGRESS NOTE  Benjamin Case  DOB: 1956/05/16  PCP: [name], MD YIF:[id number]  DOA: 04/11/2022  LOS: 2 days  Hospital Day: 3  Brief narrative: Benjamin Case is a 66 y.o. 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, DM2, HTN, COPD, GERD, treated hep C, peripheral neuropathy, arthritis. 11/5, patient presented to the ED with complaint of worsening dyspnea for 24 hours and hypoxia of 80% on room air.  Not on supplemental oxygen at baseline. He continues to smoke 1 pack/day, has been smoking for last 50 years.  He also drinks 2-3 mixed alcoholic drinks on a daily basis.  No history of drug abuse. Follows up with [name]. 11/2 Thursday, last chemotherapy 11/4, received an injection of GM-CSF for prevention of decreased white blood cell count.  In the ED, he was afebrile, hypoxic to 80s requiring 4 L oxygen by nasal cannula. Labs with WBC count elevated to 36.2 (1 day after GM-CSF), sodium 128, BUN/creatinine 31/1.21, BNP 553, troponin 529 COVID PCR/influenza negative  CT angio chest with important findings as below:  1. No pulmonary embolus. 2. New areas of ground-glass and reticulonodular opacities throughout the right lung, predominantly involving the right upper and middle lobes. This is likely infectious, including atypical viral organisms. 3. Small to moderate right pleural effusion 4. Right hilar adenopathy causes narrowing of the right mainstem bronchus. 5. Enlarging mediastinal adenopathy consistent with worsening nodal disease. 6. Enlarging nodules in the right upper lobe.  7. Left lateral ninth rib fracture is new. Osseous metastatic disease involving the sternum and T3 vertebral body 8. There are multiple low-density lesions in the liver suspicious for metastatic  disease.   Patient was started on IV antibiotic coverage with IV  cefepime, IV vancomycin. [facility] to [facility]  Subjective: Patient was seen and examined this morning.   Propped up in bed.  Does not feel good.  He was significantly dyspneic early in the morning requiring up to 10 L oxygen.  By the time of my evaluation, he was down 5 L.   Wife at bedside.   Yesterday afternoon, he went to new onset A-fib with RVR.  Started on Cardizem drip and was maintained overnight.  He has been on and off RVR overnight with heart rate close to 100 remains on Cardizem 15 units/h this morning No fever, blood pressure in 120s mostly.  Assessment/Plan: Multifocal pneumonia Evolving sepsis on admission Presented with shortness of breath, hypoxia progressively worsening in 24 hours On admission he was mildly tachycardic, no fever, WBC count elevated but patient received GM-CSF infusion the day prior only.  Lactic acid level was initially normal but later worsened to 2.1. CT chest on admission showed new areas of ground-glass and reticulonodular opacities throughout the right lung, predominantly involving the right upper and middle lobes.  Blood culture without any growth so far. Repeat chest x-ray this morning showed interval worsening of the infiltrate in the right lower lung fields testing worsening pneumonia.  There is an improvement in the aeration in the right upper lung. Currently on IV cefepime.  No fever.  Improving lactic acid level, WBC count. Continue same antibiotics for now Recent Labs  Lab 04/08/22 1025 04/11/22 1427 04/11/22 1526 04/11/22 1839 04/12/22 0308 04/13/22 0319  WBC 12.5* 36.2*  --   --  32.6* 26.7*  LATICACIDVEN  --   --  1.2 2.1*  --  1.0  PROCALCITON  --   --   --  0.27 0.29 0.33   Acute respiratory failure with hypoxia Not on supplemental oxygen at home.  Has been requiring 3 to 4 L O2 by nasal cannula mostly.  Briefly this morning, patient required as high as 10 L oxygen probably because of transient mucous plugging.  Noted to have  wheezing.  Give a dose of Lasix 40 mg IV and Solu-Medrol 125 mg IV. Oxygen requirement down to 3 L/min now.  Continue to monitor.  Moderate right pleural effusion Parapneumonic effusion versus malignant effusion. Noted in CT chest on admission. he only has right lung and is at risk of significant respiratory failure with any complication. We will hold off thoracentesis for now.  If respiratory status does not improve, may benefit from thoracentesis.   COPD Current everyday smoker Continue incentive spirometry, Mucinex, bronchodilators Noted to have wheezing this morning.  Started on Solu-Medrol 40 mg IV daily. He continues to smoke 1 pack/day, has been smoking for last 50 years. Counseled to quit.  Nicotine patch offered.   Metastatic lung cancer Patient has history of squamous cell lung cancer s/p left lobectomy (2009) with recurrence noted in Oct 2023, follows up with [name] 11/2 first and most recent chemotherapy 11/4, received an injection of GM-CSF for prevention of decreased white blood cell count. CT chest on admission showed metastasis as evidenced by multiple low-density lesions in the liver, osseous metastasis to the sternum and T3 vertebral body, enlarging mediastinal lymphadenopathy and right hilar adenopathy causing narrowing of the right mainstem bronchus. I sent a staff message to [name] regarding patient's hospitalization.  New onset A-fib with RVR Yesterday afternoon, he went to new onset A-fib with RVR.  Started on Cardizem drip and was maintained overnight.  He has been on and off RVR overnight with heart rate close to 100 remains on Cardizem 15 units/h this morning.  Continue Cardizem drip for today. Full dose Lovenox for anticoagulation.  Elevated troponin Elevated BNP Initially troponin and BNP both elevated over 500.  But no ischemic changes on EKG. EDP discussed with cardiologist Dr. Johney Frame.  Elevated troponin and BNP were suspected to be likely  because of underlying respiratory disease.  No cardiac symptoms. Downtrending levels of both Continue to monitor. Pending echocardiogram today Recent Labs    04/11/22 1427 04/11/22 1839 04/12/22 0308 04/13/22 0923  TROPONINIHS 529* 594* 437* 235*   Recent Labs    04/11/22 1428 04/13/22 0923  BNP 552.8* 525.5*   Type 2 diabetes mellitus A1c 5.8 in 2020.  Diet controlled. Currently not on meds. Sliding scale insulin while in steroids No results for input(s): "GLUCAP" in the last 168 hours.  Chronic alcohol use drinks 2-3 mixed alcoholic drinks on a daily basis CIWA protocol  Mobility Encourage ambulation.  PT eval ordered. Check oxygen requirement on ambulation  Goals of care   Code Status: Full Code  Diet:  Diet Order             Diet Heart Room service appropriate? Yes; Fluid consistency: Thin  Diet effective now                  DVT prophylaxis: Lovenox subcu   Antimicrobials: IV cefepime  Fluid: Stop IV fluid today Consultants: None Family Communication: Wife at bedside   Status is: Inpatient  Continue in-hospital care because: Pending clinical improvement Level of care: Progressive   Dispo: The patient is from: Home  Anticipated d/c is to: Hopefully home in 1 to 2 days              Patient currently is not medically stable to d/c.   Difficult to place patient No     Infusions:   sodium chloride 100 mL/hr at 04/13/22 0533   ceFEPime (MAXIPIME) IV Stopped (04/13/22 0610)   diltiazem (CARDIZEM) infusion 15 mg/hr (04/13/22 1019)    Scheduled Meds:  Chlorhexidine Gluconate Cloth  6 each Topical Daily   enoxaparin (LOVENOX) injection  1 mg/kg Subcutaneous U23N   folic acid  1 mg Oral Daily   guaiFENesin  600 mg Oral BID   insulin aspart  0-5 Units Subcutaneous QHS   insulin aspart  0-9 Units Subcutaneous TID WC   ipratropium-albuterol  3 mL Nebulization QID   loratadine  10 mg Oral Daily   [START ON 04/14/2022]  methylPREDNISolone (SOLU-MEDROL) injection  40 mg Intravenous Q24H   multivitamin with minerals  1 tablet Oral Daily   thiamine  100 mg Oral Daily   Or   thiamine  100 mg Intravenous Daily    PRN meds: acetaminophen **OR** acetaminophen, guaiFENesin-dextromethorphan, hydrALAZINE, ipratropium-albuterol, LORazepam **OR** LORazepam, ondansetron **OR** ondansetron (ZOFRAN) IV, mouth rinse, oxyCODONE-acetaminophen **AND** oxyCODONE, zolpidem   Antimicrobials: Anti-infectives (From admission, onward)    Start     Dose/Rate Route Frequency Ordered Stop   04/11/22 2200  ceFEPIme (MAXIPIME) 2 g in sodium chloride 0.9 % 100 mL IVPB        2 g 200 mL/hr over 30 Minutes Intravenous Every 8 hours 04/11/22 1718     04/11/22 1515  ceFEPIme (MAXIPIME) 2 g in sodium chloride 0.9 % 100 mL IVPB        2 g 200 mL/hr over 30 Minutes Intravenous  Once 04/11/22 1502 04/11/22 1632   04/11/22 1515  vancomycin (VANCOREADY) IVPB 1500 mg/300 mL        1,500 mg 150 mL/hr over 120 Minutes Intravenous  Once 04/11/22 1502 04/11/22 1900       Objective: Vitals:   04/13/22 0911 04/13/22 1134  BP:    Pulse:    Resp:    Temp:    SpO2: 96% 96%    Intake/Output Summary (Last 24 hours) at 04/13/2022 1238 Last data filed at 04/13/2022 1145 Gross per 24 hour  Intake 1777.07 ml  Output 1625 ml  Net 152.07 ml   Filed Weights   04/11/22 1339 04/11/22 1813  Weight: 80.7 kg 89.2 kg   Weight change:  Body mass index is 28.22 kg/m.   Physical Exam: General exam: Pleasant, elderly Caucasian male.  Not in pain at this time Skin: No rashes, lesions or ulcers. HEENT: Atraumatic, normocephalic, no obvious bleeding Lungs: Right lung field with diffuse wheezing today.  S/p prior left lung resection CVS: A-fib with heart rate close to 100, no murmur  GI/Abd soft, nontender, nondistended, bowel sound present CNS: Alert, awake, oriented x3 Psychiatry: Anxious Extremities: No pedal edema, no calf tenderness  Data  Review: I have personally reviewed the laboratory data and studies available.  F/u labs ordered Unresulted Labs (From admission, onward)     Start     Ordered   04/14/22 0500  CBC with Differential/Platelet  Tomorrow morning,   R       Question:  Specimen collection method  Answer:  Lab=Lab collect   04/13/22 1237   04/14/22 3614  Basic metabolic panel  Tomorrow morning,   R       Question:  Specimen collection method  Answer:  Lab=Lab collect   04/13/22 1237   04/14/22 0500  Magnesium  Tomorrow morning,   R       Question:  Specimen collection method  Answer:  Lab=Lab collect   04/13/22 1237   04/14/22 0500  Phosphorus  Tomorrow morning,   R       Question:  Specimen collection method  Answer:  Lab=Lab collect   04/13/22 1237   04/12/22 0830  Expectorated Sputum Assessment w Gram Stain, Rflx to Resp Cult  Once,   R        04/12/22 0830            Signed, Terrilee Croak, MD [facility] 04/13/2022

## 2022-04-14 ENCOUNTER — Ambulatory Visit: Payer: Medicare Other | Admitting: Physician Assistant

## 2022-04-14 ENCOUNTER — Other Ambulatory Visit: Payer: Medicare Other

## 2022-04-14 DIAGNOSIS — C3490 Malignant neoplasm of unspecified part of unspecified bronchus or lung: Secondary | ICD-10-CM | POA: Diagnosis not present

## 2022-04-14 DIAGNOSIS — I272 Pulmonary hypertension, unspecified: Secondary | ICD-10-CM | POA: Diagnosis not present

## 2022-04-14 DIAGNOSIS — J189 Pneumonia, unspecified organism: Secondary | ICD-10-CM | POA: Diagnosis not present

## 2022-04-14 DIAGNOSIS — C3491 Malignant neoplasm of unspecified part of right bronchus or lung: Secondary | ICD-10-CM | POA: Diagnosis not present

## 2022-04-14 DIAGNOSIS — I480 Paroxysmal atrial fibrillation: Secondary | ICD-10-CM | POA: Diagnosis not present

## 2022-04-14 LAB — CBC WITH DIFFERENTIAL/PLATELET
Abs Immature Granulocytes: 3.52 10*3/uL — ABNORMAL HIGH (ref 0.00–0.07)
Basophils Absolute: 0 10*3/uL (ref 0.0–0.1)
Basophils Relative: 0 %
Eosinophils Absolute: 0 10*3/uL (ref 0.0–0.5)
Eosinophils Relative: 0 %
HCT: 33.9 % — ABNORMAL LOW (ref 39.0–52.0)
Hemoglobin: 12.2 g/dL — ABNORMAL LOW (ref 13.0–17.0)
Immature Granulocytes: 17 %
Lymphocytes Relative: 3 %
Lymphs Abs: 0.7 10*3/uL (ref 0.7–4.0)
MCH: 34.1 pg — ABNORMAL HIGH (ref 26.0–34.0)
MCHC: 36 g/dL (ref 30.0–36.0)
MCV: 94.7 fL (ref 80.0–100.0)
Monocytes Absolute: 1.6 10*3/uL — ABNORMAL HIGH (ref 0.1–1.0)
Monocytes Relative: 8 %
Neutro Abs: 14.4 10*3/uL — ABNORMAL HIGH (ref 1.7–7.7)
Neutrophils Relative %: 72 %
Platelets: 188 10*3/uL (ref 150–400)
RBC: 3.58 MIL/uL — ABNORMAL LOW (ref 4.22–5.81)
RDW: 10.9 % — ABNORMAL LOW (ref 11.5–15.5)
WBC: 20.3 10*3/uL — ABNORMAL HIGH (ref 4.0–10.5)
nRBC: 0 % (ref 0.0–0.2)

## 2022-04-14 LAB — GLUCOSE, CAPILLARY
Glucose-Capillary: 172 mg/dL — ABNORMAL HIGH (ref 70–99)
Glucose-Capillary: 182 mg/dL — ABNORMAL HIGH (ref 70–99)
Glucose-Capillary: 175 mg/dL — ABNORMAL HIGH (ref 70–99)
Glucose-Capillary: 228 mg/dL — ABNORMAL HIGH (ref 70–99)

## 2022-04-14 LAB — BASIC METABOLIC PANEL
Anion gap: 13 (ref 5–15)
BUN: 27 mg/dL — ABNORMAL HIGH (ref 8–23)
CO2: 29 mmol/L (ref 22–32)
Calcium: 9.1 mg/dL (ref 8.9–10.3)
Chloride: 86 mmol/L — ABNORMAL LOW (ref 98–111)
Creatinine, Ser: 1.13 mg/dL (ref 0.61–1.24)
GFR, Estimated: >60 mL/min (ref >60)
Glucose, Bld: 168 mg/dL — ABNORMAL HIGH (ref 70–99)
Potassium: 3.6 mmol/L (ref 3.5–5.1)
Sodium: 128 mmol/L — ABNORMAL LOW (ref 135–145)

## 2022-04-14 LAB — PHOSPHORUS: Phosphorus: 3.1 mg/dL (ref 2.5–4.6)

## 2022-04-14 LAB — MAGNESIUM: Magnesium: 1.3 mg/dL — ABNORMAL LOW (ref 1.7–2.4)

## 2022-04-14 MED ORDER — OXYCODONE HCL 5 MG PO TABS
5.0000 mg | ORAL_TABLET | ORAL | Status: DC | PRN
  Administered 2022-04-14 – 2022-04-17 (×15): 5 mg via ORAL
  Filled 2022-04-14 (×15): qty 1

## 2022-04-14 MED ORDER — APIXABAN 5 MG PO TABS
5.0000 mg | ORAL_TABLET | Freq: Two times a day (BID) | ORAL | Status: DC
  Administered 2022-04-14 – 2022-04-17 (×6): 5 mg via ORAL
  Filled 2022-04-14 (×6): qty 1

## 2022-04-14 MED ORDER — MAGNESIUM SULFATE 2 GM/50ML IV SOLN
2.0000 g | Freq: Once | INTRAVENOUS | Status: AC
  Administered 2022-04-14: 2 g via INTRAVENOUS
  Filled 2022-04-14: qty 50

## 2022-04-14 MED ORDER — OXYCODONE-ACETAMINOPHEN 5-325 MG PO TABS
1.0000 | ORAL_TABLET | ORAL | Status: DC | PRN
  Administered 2022-04-14 – 2022-04-17 (×15): 1 via ORAL
  Filled 2022-04-14 (×15): qty 1

## 2022-04-14 NOTE — Discharge Instructions (Signed)

## 2022-04-14 NOTE — Progress Notes (Signed)
PT Cancellation Note  Patient Details Name: MARRELL DICAPRIO MRN: [id number] DOB: 1955-11-23   Cancelled Treatment:    Reason Eval/Treat Not Completed: Patient declined, no reason specified (pt fatigued and requesting to rest for an hour or a little more. Will follow up at later date/time as schedule allows and pt able.)   Gwynneth Albright PT, [place] Office [phone]  04/14/22 10:42 AM

## 2022-04-14 NOTE — Progress Notes (Signed)
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, DM2, HTN, COPD, GERD, treated hep C, peripheral neuropathy, arthritis presented in the ED with worsening shortness of breath for last 24 hours, remain hypoxic on room air SPO2 80% on arrival.  Patient continued to smoke 1 pack/day has been smoking for 50+ years.  Labs in the ED shows WBC 36.2, CT chest ruled out PE but showed new areas of groundglass and retronodular opacities throughout the right lung predominantly involving the right upper and middle lobes.  Patient is started on empiric antibiotics and admitted for further management.  Assessment & Plan:   Principal Problem:   Pneumonia Active Problems:   COPD GOLD III    Metastatic primary lung cancer, right ([facility])  Multifocal pneumonia Evolving sepsis POA: He presented with shortness of breath, hypoxia, SPO2 80% on room air. Lactic acid was initially normal but later increased to 2.1. Leukocytosis 36.2 could be secondary to recent GM-CSF infusion. CT chest showed new areas of ground-glass and reticulonodular opacities throughout the right lung, predominantly involving the right upper and middle lobes.  Blood cultures negative so far. Repeat chest x-ray showed interval worsening of infiltrate in the right lower lung field. Continue IV cefepime.  Remains afebrile.  Acute hypoxic respiratory failure: Not on supplemental oxygen at home. Has been requiring 3 to 4 L O2 by nasal cannula. Briefly he has required up to 10 L of oxygen could be due to transient mucous plugging.   Noted to have wheezing.  Give a dose of Lasix 40 mg IV and Solu-Medrol 125 mg IV. He remains on 3 L of supplemental oxygen SPO2 93%.   Moderate  right pleural effusion: Parapneumonic effusion versus malignant effusion: Noted in CT chest on admission. he only has right lung and is at risk of significant respiratory failure with any complication.  Hold off thoracocentesis for now.  If respiratory status does not improve, may benefit from thoracentesis.    COPD: Continue incentive spirometry, Mucinex, bronchodilators Continue Solu-Medrol 40 mg daily for wheezing. He continues to smoke 1 pack/day, has been smoking for last 50 years.  Counseled to quit.  Nicotine patch offered.   Metastatic lung cancer: Patient has history of squamous cell lung cancer s/p left lobectomy (2009) with recurrence noted in Oct 2023,  He follows up with [name], has recently has chemo 04/08/22 11/4/ 23, received an injection of GM-CSF for prevention of decreased white blood cell count. CT chest on admission showed metastasis as evidenced by multiple low-density lesions in the liver, osseous metastasis to the sternum and T3 vertebral body, enlarging mediastinal lymphadenopathy and right hilar adenopathy causing narrowing of the right mainstem bronchus. [name] notified.   New onset A-fib with RVR: He went into new onset A-fib with RVR.   He was started on Cardizem drip and rate remains reasonably controlled. Cardiology is consulted.  Continue full dose Lovenox for anticoagulation.   Elevated troponins: Initially troponin and BNP both elevated over 500.  No ischemic changes noted on EKG. EDP discussed with cardiologist Dr. Johney Frame.  Formal cardiology consult requested. Elevated troponin and BNP were suspected to be due to demand ischemia in the setting of respiratory problem. Echo: LVEF 55 to 60%.  No RWMA  Type 2 diabetes : A1c 5.8 in 2020.  Diet controlled. Currently not on meds.  Chronic EtOH use: He drinks 2-3 mixed alcoholic drinks on a daily basis Continue CIWA protocol   Mobility Encourage ambulation.  PT eval ordered.  Check oxygen  requirement on ambulation   Goals of care Code Status: Full Code  DVT prophylaxis: Lovenox Code Status: Full code Family Communication: No family at bedside Disposition Plan:   Status is: Inpatient Remains inpatient appropriate because: Admitted for multifocal pneumonia acute hypoxic respiratory failure in the setting of lung cancer with metastasis.  Continue IV antibiotics, now has developed new onset A-fib with RVR,  started on Cardizem gtt.  Cardiology is consulted.   Consultants:  Cardiology Oncology  Procedures: CTA Chest Antimicrobials:  Anti-infectives (From admission, onward)    Start     Dose/Rate Route Frequency Ordered Stop   04/11/22 2200  ceFEPIme (MAXIPIME) 2 g in sodium chloride 0.9 % 100 mL IVPB        2 g 200 mL/hr over 30 Minutes Intravenous Every 8 hours 04/11/22 1718     04/11/22 1515  ceFEPIme (MAXIPIME) 2 g in sodium chloride 0.9 % 100 mL IVPB        2 g 200 mL/hr over 30 Minutes Intravenous  Once 04/11/22 1502 04/11/22 1632   04/11/22 1515  vancomycin (VANCOREADY) IVPB 1500 mg/300 mL        1,500 mg 150 mL/hr over 120 Minutes Intravenous  Once 04/11/22 1502 04/11/22 1900       Subjective: Patient was seen and examined at bedside.  Overnight events noted. He was sitting comfortably on the chair,  watching television. Patient remains on 3 L of supplemental oxygen. He denies any chest pain.  Objective: Vitals:   04/14/22 0441 04/14/22 0801 04/14/22 1116 04/14/22 1149  BP: 117/67   103/62  Pulse: 82   74  Resp: 17   19  Temp: 98.6 F (37 C)   98.3 F (36.8 C)  TempSrc: Oral   Oral  SpO2: 96% 96% 95% 93%  Weight:      Height:        Intake/Output Summary (Last 24 hours) at 04/14/2022 1337 Last data filed at 04/14/2022 1100 Gross per 24 hour  Intake 1476.65 ml  Output 1450 ml  Net 26.65 ml   Filed Weights   04/11/22 1339 04/11/22 1813  Weight: 80.7 kg 89.2 kg    Examination:  General exam: Appears comfortable, not in any acute  distress, deconditioned Respiratory system: Wheezing noted on right side, s/p prior left lung resection. Cardiovascular system: S1 & S2 heard, irregular rhythm, no murmur. Gastrointestinal system: Abdomen is soft, non tender, non distended, BS+ Central nervous system: Alert and oriented X 3. No focal neurological deficits. Extremities: No edema, no cyanosis, no clubbing. Skin: No rashes, lesions or ulcers Psychiatry: Judgement and insight appear normal. Mood & affect appropriate.     Data Reviewed: I have personally reviewed following labs and imaging studies  CBC: Recent Labs  Lab 04/08/22 1025 04/11/22 1427 04/12/22 0308 04/13/22 0319 04/14/22 0540  WBC 12.5* 36.2* 32.6* 26.7* 20.3*  NEUTROABS 9.5* 32.3*  --  23.2* 14.4*  HGB 15.5 13.7 12.4* 12.5* 12.2*  HCT 42.0 39.7 35.8* 36.0* 33.9*  MCV 94.6 99.3 98.6 98.4 94.7  PLT 322 215 181 179 956   Basic Metabolic Panel: Recent Labs  Lab 04/08/22 1025 04/11/22 1427 04/12/22 0308 04/13/22 0319 04/14/22 0540  NA 131* 128* 130* 128*  128*  K 4.1 4.1 4.0 3.8 3.6  CL 95* 91* 97* 95* 86*  CO2 26 27 25 25 29   GLUCOSE 150* 158* 140* 141* 168*  BUN 13 31* 22 17 27*  CREATININE 1.00 1.21 0.90 0.82 1.13  CALCIUM 9.9 8.4* 8.0* 8.3* 9.1  MG  --   --   --   --  1.3*  PHOS  --   --   --   --  3.1   GFR: Estimated Creatinine Clearance: 70.4 mL/min (by C-G formula based on SCr of 1.13 mg/dL). Liver Function Tests: Recent Labs  Lab 04/08/22 1025 04/11/22 1427  AST 28 44*  ALT 26 39  ALKPHOS 52 54  BILITOT 0.9 1.2  PROT 7.4 7.0  ALBUMIN 4.3 3.8   No results for input(s): "LIPASE", "AMYLASE" in the last 168 hours. No results for input(s): "AMMONIA" in the last 168 hours. Coagulation Profile: No results for input(s): "INR", "PROTIME" in the last 168 hours. Cardiac Enzymes: No results for input(s): "CKTOTAL", "CKMB", "CKMBINDEX", "TROPONINI" in the last 168 hours. BNP (last 3 results) No results for input(s): "PROBNP" in the  last 8760 hours. HbA1C: No results for input(s): "HGBA1C" in the last 72 hours. CBG: Recent Labs  Lab 04/13/22 1622 04/13/22 2115 04/14/22 0728 04/14/22 1147  GLUCAP 207* 192* 172* 182*   Lipid Profile: No results for input(s): "CHOL", "HDL", "LDLCALC", "TRIG", "CHOLHDL", "LDLDIRECT" in the last 72 hours. Thyroid Function Tests: No results for input(s): "TSH", "T4TOTAL", "FREET4", "T3FREE", "THYROIDAB" in the last 72 hours. Anemia Panel: No results for input(s): "VITAMINB12", "FOLATE", "FERRITIN", "TIBC", "IRON", "RETICCTPCT" in the last 72 hours. Sepsis Labs: Recent Labs  Lab 04/11/22 1526 04/11/22 1839 04/12/22 0308 04/13/22 0319  PROCALCITON  --  0.27 0.29 0.33  LATICACIDVEN 1.2 2.1*  --  1.0    Recent Results (from the past 240 hour(s))  Culture, blood (routine x 2)     Status: None (Preliminary result)   Collection Time: 04/11/22  2:50 PM   Specimen: BLOOD  Result Value Ref Range Status   Specimen Description   Final    BLOOD RIGHT ANTECUBITAL Performed at [facility] [address] [place], [place] [address]    Special Requests   Final    BOTTLES DRAWN AEROBIC AND ANAEROBIC Blood Culture results may not be optimal due to an inadequate volume of blood received in culture bottles Performed at [facility] [address] [place], [place] [address]    Culture   Final    NO GROWTH 3 DAYS Performed at [facility], [place] [address], [place], [place] [address]    Report Status PENDING  Incomplete  Culture, blood (routine x 2)     Status: None (Preliminary result)   Collection Time: 04/11/22  2:55 PM   Specimen: BLOOD  Result Value Ref Range Status   Specimen Description   Final    BLOOD LEFT ANTECUBITAL Performed at [facility] [address] [place], [place] [address]    Special Requests   Final    BOTTLES DRAWN AEROBIC ONLY Blood Culture results may not be optimal due to an inadequate volume  of blood received in culture bottles Performed at [facility] [address] [place], [place] [address]    Culture   Final    NO GROWTH 3 DAYS Performed at [facility], [place] [address], [place], [place] [address]    Report Status PENDING  Incomplete  Resp Panel by RT-PCR (Flu A&B, Covid) Anterior Nasal Swab     Status: None   Collection Time: 04/11/22  3:26 PM   Specimen: Anterior Nasal Swab  Result Value Ref Range Status   SARS Coronavirus 2 by RT PCR NEGATIVE NEGATIVE Final    Comment: (NOTE) SARS-CoV-2 target nucleic acids are NOT DETECTED.  The SARS-CoV-2 RNA is generally detectable in upper respiratory specimens during the acute phase of infection. The lowest concentration of SARS-CoV-2 viral copies this assay can detect is 138 copies/mL. A negative result does not preclude SARS-Cov-2 infection and should not be used as the sole basis for treatment or other patient management decisions. A negative result may occur with  improper specimen collection/handling, submission of specimen other than nasopharyngeal swab, presence of viral mutation(s) within the areas targeted by this assay, and inadequate number of viral copies(<138 copies/mL). A negative result must be combined with clinical observations, patient history, and epidemiological information. The expected result is Negative.  Fact Sheet for Patients:  EntrepreneurPulse.com.au  Fact Sheet for Healthcare Providers:  IncredibleEmployment.be  This test is no t yet approved or cleared by the [place] FDA and  has been authorized for detection and/or diagnosis of SARS-CoV-2 by FDA under an Emergency Use Authorization (EUA). This EUA will remain  in effect (meaning this test can be used) for the duration of the COVID-19 declaration under Section 564(b)(1) of the Act, 21 U.S.C.section 360bbb-3(b)(1), unless the authorization is terminated  or revoked sooner.        Influenza A by PCR NEGATIVE NEGATIVE Final   Influenza B by PCR NEGATIVE NEGATIVE Final    Comment: (NOTE) The Xpert Xpress SARS-CoV-2/FLU/RSV plus assay is intended as an aid in the diagnosis of influenza from Nasopharyngeal swab specimens and should not be used as a sole basis for treatment. Nasal washings and aspirates are unacceptable for Xpert Xpress SARS-CoV-2/FLU/RSV testing.  Fact Sheet for Patients: EntrepreneurPulse.com.au  Fact Sheet for Healthcare Providers: IncredibleEmployment.be  This test is not yet approved or cleared by the [place] FDA and has been authorized for detection and/or diagnosis of SARS-CoV-2 by FDA under an Emergency Use Authorization (EUA). This EUA will remain in effect (meaning this test can be used) for the duration of the COVID-19 declaration under Section 564(b)(1) of the Act, 21 U.S.C. section 360bbb-3(b)(1), unless the authorization is terminated or revoked.  Performed at [facility], [place] [address], [place], [place] [address]   MRSA Next Gen by PCR, Nasal     Status: None   Collection Time: 04/11/22  6:07 PM   Specimen: Nasal Mucosa; Nasal Swab  Result Value Ref Range Status   MRSA by PCR Next Gen NOT DETECTED NOT DETECTED Final    Comment: (NOTE) The GeneXpert MRSA Assay (FDA approved for NASAL specimens only), is one component of a comprehensive MRSA colonization surveillance program. It is not intended to diagnose MRSA infection nor to guide or monitor treatment for MRSA infections. Test performance is not FDA approved in patients less than 10 years old. Performed at [facility], [place] [address], [place],  [address]   Expectorated Sputum Assessment w Gram Stain, Rflx to Resp Cult     Status: None   Collection Time: 04/12/22  3:15 AM   Specimen: Sputum  Result Value Ref Range Status   Specimen Description SPUTUM  Final   Special  Requests SPUTUM  Final   Sputum evaluation   Final    Sputum specimen  not acceptable for testing.  Please recollect.   [place], J. AT 0714 BY 11/06/203MECIAL J. Performed at [facility], [place] [address] [place], [place] [address]    Report Status 04/12/2022 FINAL  Final    Radiology Studies: ECHOCARDIOGRAM COMPLETE  Result Date: 04/13/2022    ECHOCARDIOGRAM REPORT   Patient Name:   Benjamin Case Date of Exam: 04/13/2022 Medical Rec #:  [id number]       Height:       70.0 in Accession #:    [id number]      Weight:       196.6 lb Date of Birth:  09-Sep-1955       BSA:          2.072 m Patient Age:    63 years        BP:           104/71 mmHg Patient Gender: M               HR:           120 bpm. Exam Location:  Inpatient Procedure: 2D Echo, Cardiac Doppler, Color Doppler and Intracardiac            Opacification Agent Indications:    R94.31 Abnormal EKG  History:        Patient has no prior history of Echocardiogram examinations.                 COPD, Signs/Symptoms:Shortness of Breath and Dyspnea; Risk                 Factors:Current Smoker. Lung cancer. ETOH.  Sonographer:    Roseanna Rainbow RDCS Referring Phys: [id number] [employer]  Sonographer Comments: Technically difficult study due to poor echo windows. Image acquisition challenging due to patient body habitus. IMPRESSIONS  1. Left ventricular ejection fraction, by estimation, is 55 to 60%. The left ventricle has normal function. The left ventricle has no regional wall motion abnormalities. Left ventricular diastolic function could not be evaluated.  2. Right ventricular systolic function is normal. The right ventricular size is severely enlarged. There is moderately elevated pulmonary artery systolic pressure.  3. Right atrial size was severely dilated.  4. The mitral valve is normal in structure. No evidence of mitral valve regurgitation. No evidence of mitral stenosis.  5. Tricuspid valve regurgitation is mild to moderate.  6. The aortic  valve is tricuspid. Aortic valve regurgitation is not visualized. No aortic stenosis is present.  7. The inferior vena cava is dilated in size with >50% respiratory variability, suggesting right atrial pressure of 8 mmHg. FINDINGS  Left Ventricle: Left ventricular ejection fraction, by estimation, is 55 to 60%. The left ventricle has normal function. The left ventricle has no regional wall motion abnormalities. The left ventricular internal cavity size was normal in size. There is  no left ventricular hypertrophy. Left ventricular diastolic function could not be evaluated due to atrial fibrillation. Left ventricular diastolic function could not be evaluated. Right Ventricle: The right ventricular size is severely enlarged. No increase in right ventricular wall thickness. Right ventricular systolic function is normal. There is moderately elevated pulmonary artery systolic pressure. The tricuspid regurgitant velocity is 3.47 m/s, and with an assumed right atrial pressure of 8 mmHg, the estimated right ventricular systolic pressure is 86.7 mmHg. Left Atrium: Left atrial size was normal in size. Right Atrium: Right atrial size was severely dilated. Pericardium: There is no evidence of pericardial effusion. Mitral Valve: The mitral  valve is normal in structure. No evidence of mitral valve regurgitation. No evidence of mitral valve stenosis. Tricuspid Valve: The tricuspid valve is normal in structure. Tricuspid valve regurgitation is mild to moderate. No evidence of tricuspid stenosis. Aortic Valve: The aortic valve is tricuspid. Aortic valve regurgitation is not visualized. No aortic stenosis is present. Pulmonic Valve: The pulmonic valve was normal in structure. Pulmonic valve regurgitation is not visualized. No evidence of pulmonic stenosis. Aorta: The aortic root is normal in size and structure. Venous: The inferior vena cava is dilated in size with greater than 50% respiratory variability, suggesting right atrial  pressure of 8 mmHg. IAS/Shunts: No atrial level shunt detected by color flow Doppler.  LEFT VENTRICLE PLAX 2D LVIDd:         4.20 cm LVIDs:         3.00 cm LV PW:         1.10 cm LV IVS:        0.96 cm LVOT diam:     2.40 cm LV SV:         53 LV SV Index:   26 LVOT Area:     4.52 cm  LV Volumes (MOD) LV vol d, MOD A4C: 58.8 ml LV vol s, MOD A4C: 18.5 ml LV SV MOD A4C:     58.8 ml RIGHT VENTRICLE             IVC RV S prime:     12.60 cm/s  IVC diam: 2.10 cm TAPSE (M-mode): 1.9 cm LEFT ATRIUM           Index        RIGHT ATRIUM           Index LA diam:      3.40 cm 1.64 cm/m   RA Area:     31.50 cm LA Vol (A2C): 29.1 ml 14.04 ml/m  RA Volume:   122.00 ml 58.87 ml/m LA Vol (A4C): 48.6 ml 23.45 ml/m  AORTIC VALVE LVOT Vmax:   106.10 cm/s LVOT Vmean:  68.350 cm/s LVOT VTI:    0.118 m  AORTA Ao Root diam: 3.70 cm Ao Asc diam:  3.30 cm MITRAL VALVE               TRICUSPID VALVE MV Area (PHT): 5.01 cm    TR Peak grad:   48.2 mmHg MV Decel Time: 152 msec    TR Vmax:        347.00 cm/s MV E velocity: 87.55 cm/s                            SHUNTS                            Systemic VTI:  0.12 m                            Systemic Diam: 2.40 cm Dani Gobble Croitoru MD Electronically signed by Sanda Klein MD Signature Date/Time: 04/13/2022/4:06:09 PM    Final    DG Chest Port 1 View  Result Date: 04/13/2022 CLINICAL DATA:  Dyspnea EXAM: PORTABLE CHEST 1 VIEW COMPARISON:  Previous studies including the examination of 04/11/2022 FINDINGS: There is opacification of left hemithorax consistent with left pneumonectomy. Increased interstitial and alveolar markings are seen in right lower lung field with interval worsening. There is improvement in the  aeration of right upper lung field. Small right pleural effusion is seen. There is no pneumothorax. IMPRESSION: There is interval worsening of infiltrate in right lower lung fields suggesting worsening pneumonia. There is improvement in the aeration in right upper lung field  suggesting decrease in interstitial edema or interstitial pneumonia. Small right pleural effusion. Electronically Signed   By: Elmer Picker M.D.   On: 04/13/2022 10:11    Scheduled Meds:  enoxaparin (LOVENOX) injection  1 mg/kg Subcutaneous M21R   folic acid  1 mg Oral Daily   guaiFENesin  600 mg Oral BID   insulin aspart  0-5 Units Subcutaneous QHS   insulin aspart  0-9 Units Subcutaneous TID WC   ipratropium-albuterol  3 mL Nebulization QID   loratadine  10 mg Oral Daily   methylPREDNISolone (SOLU-MEDROL) injection  40 mg Intravenous Q24H   multivitamin with minerals  1 tablet Oral Daily   thiamine  100 mg Oral Daily   Or   thiamine  100 mg Intravenous Daily   Continuous Infusions:  ceFEPime (MAXIPIME) IV 2 g (04/14/22 1244)   diltiazem (CARDIZEM) infusion 15 mg/hr (04/14/22 1100)     LOS: 3 days    Time spent: 50 mins    Perina Salvaggio, MD [facility]   If 7PM-7AM, please contact night-coverage

## 2022-04-14 NOTE — Progress Notes (Signed)
Subjective: The patient is seen and examined today.  His wife was at the bedside. This is a very pleasant 66 years old white male with stage IV (T1a, N2, M1 C) non-small cell lung cancer, squamous cell carcinoma presented with right upper lobe lung nodule in addition to right hilar and mediastinal lymphadenopathy and metastatic disease to several bone areas as well as musculoskeletal muscles diagnosed in October 2023. The patient also has a history of a stage IIIa non-small cell lung cancer, squamous cell carcinoma diagnosed in June 2009 status post left pneumonectomy followed by 4 cycles of adjuvant systemic chemotherapy completed in April 29, 2008 and has been on observation since that time. 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 The patient started the first cycle of his treatment with systemic chemotherapy with carboplatin, paclitaxel and Keytruda on 04/08/2022.  This was followed by Neulasta injection on 04/10/2022.  The patient mentioned that Saturday evening after the Neulasta injection he started having more weakness and fatigue as well as shortness of breath and chills.  He presented to the hospital for evaluation the next day and imaging studies showed no evidence for pulmonary embolism but there was new areas of groundglass and reticulonodular opacities throughout the right lung predominantly involving the right upper and middle lobes likely infectious in origin.  There was also a small to moderate right pleural effusion that is new.  He also had enlarging mediastinal adenopathy consistent with worsening nodal disease but the patient just started treatment few days ago. He is currently undergoing treatment for pneumonia with cefepime and he is feeling a little bit better.  He continues to have shortness of breath but no significant chest pain or hemoptysis.  He denied having any current fever or chills. Objective: Vital signs in last 24 hours: Temp:  [97.7 F (36.5 C)-98.7 F  (37.1 C)] 98.3 F (36.8 C) (11/08 1149) Pulse Rate:  [74-106] 74 (11/08 1149) Resp:  [17-20] 19 (11/08 1149) BP: (103-122)/(62-75) 103/62 (11/08 1149) SpO2:  [93 %-96 %] 96 % (11/08 1635)  Intake/Output from previous day: 11/07 0701 - 11/08 0700 In: 1156.7 [I.V.:956.7; IV Piggyback:200] Out: 2025 [Urine:2025] Intake/Output this shift: Total I/O In: 320 [P.O.:120; IV Piggyback:200] Out: 300 [Urine:300]  General appearance: alert, cooperative, fatigued, and no distress Resp: dullness to percussion RLL and rales RLL Cardio: regular rate and rhythm, S1, S2 normal, no murmur, click, rub or gallop GI: soft, non-tender; bowel sounds normal; no masses,  no organomegaly Extremities: extremities normal, atraumatic, no cyanosis or edema  Lab Results:  Recent Labs    04/13/22 0319 04/14/22 0540  WBC 26.7* 20.3*  HGB 12.5* 12.2*  HCT 36.0* 33.9*  PLT 179 188   BMET Recent Labs    04/13/22 0319 04/14/22 0540  NA 128* 128*  K 3.8 3.6  CL 95* 86*  CO2 25 29  GLUCOSE 141* 168*  BUN 17 27*  CREATININE 0.82 1.13  CALCIUM 8.3* 9.1    Studies/Results: ECHOCARDIOGRAM COMPLETE  Result Date: 04/13/2022    ECHOCARDIOGRAM REPORT   Patient Name:   Benjamin Case Date of Exam: 04/13/2022 Medical Rec #:  [id number]       Height:       70.0 in Accession #:    [id number]      Weight:       196.6 lb Date of Birth:  1956/05/27       BSA:          2.072 m Patient Age:    25  years        BP:           104/71 mmHg Patient Gender: M               HR:           120 bpm. Exam Location:  Inpatient Procedure: 2D Echo, Cardiac Doppler, Color Doppler and Intracardiac            Opacification Agent Indications:    R94.31 Abnormal EKG  History:        Patient has no prior history of Echocardiogram examinations.                 COPD, Signs/Symptoms:Shortness of Breath and Dyspnea; Risk                 Factors:Current Smoker. Lung cancer. ETOH.  Sonographer:    Roseanna Rainbow RDCS Referring Phys: [id number] [facility]   Sonographer Comments: Technically difficult study due to poor echo windows. Image acquisition challenging due to patient body habitus. IMPRESSIONS  1. Left ventricular ejection fraction, by estimation, is 55 to 60%. The left ventricle has normal function. The left ventricle has no regional wall motion abnormalities. Left ventricular diastolic function could not be evaluated.  2. Right ventricular systolic function is normal. The right ventricular size is severely enlarged. There is moderately elevated pulmonary artery systolic pressure.  3. Right atrial size was severely dilated.  4. The mitral valve is normal in structure. No evidence of mitral valve regurgitation. No evidence of mitral stenosis.  5. Tricuspid valve regurgitation is mild to moderate.  6. The aortic valve is tricuspid. Aortic valve regurgitation is not visualized. No aortic stenosis is present.  7. The inferior vena cava is dilated in size with >50% respiratory variability, suggesting right atrial pressure of 8 mmHg. FINDINGS  Left Ventricle: Left ventricular ejection fraction, by estimation, is 55 to 60%. The left ventricle has normal function. The left ventricle has no regional wall motion abnormalities. The left ventricular internal cavity size was normal in size. There is  no left ventricular hypertrophy. Left ventricular diastolic function could not be evaluated due to atrial fibrillation. Left ventricular diastolic function could not be evaluated. Right Ventricle: The right ventricular size is severely enlarged. No increase in right ventricular wall thickness. Right ventricular systolic function is normal. There is moderately elevated pulmonary artery systolic pressure. The tricuspid regurgitant velocity is 3.47 m/s, and with an assumed right atrial pressure of 8 mmHg, the estimated right ventricular systolic pressure is 69.6 mmHg. Left Atrium: Left atrial size was normal in size. Right Atrium: Right atrial size was severely dilated.  Pericardium: There is no evidence of pericardial effusion. Mitral Valve: The mitral valve is normal in structure. No evidence of mitral valve regurgitation. No evidence of mitral valve stenosis. Tricuspid Valve: The tricuspid valve is normal in structure. Tricuspid valve regurgitation is mild to moderate. No evidence of tricuspid stenosis. Aortic Valve: The aortic valve is tricuspid. Aortic valve regurgitation is not visualized. No aortic stenosis is present. Pulmonic Valve: The pulmonic valve was normal in structure. Pulmonic valve regurgitation is not visualized. No evidence of pulmonic stenosis. Aorta: The aortic root is normal in size and structure. Venous: The inferior vena cava is dilated in size with greater than 50% respiratory variability, suggesting right atrial pressure of 8 mmHg. IAS/Shunts: No atrial level shunt detected by color flow Doppler.  LEFT VENTRICLE PLAX 2D LVIDd:         4.20 cm LVIDs:  3.00 cm LV PW:         1.10 cm LV IVS:        0.96 cm LVOT diam:     2.40 cm LV SV:         53 LV SV Index:   26 LVOT Area:     4.52 cm  LV Volumes (MOD) LV vol d, MOD A4C: 58.8 ml LV vol s, MOD A4C: 18.5 ml LV SV MOD A4C:     58.8 ml RIGHT VENTRICLE             IVC RV S prime:     12.60 cm/s  IVC diam: 2.10 cm TAPSE (M-mode): 1.9 cm LEFT ATRIUM           Index        RIGHT ATRIUM           Index LA diam:      3.40 cm 1.64 cm/m   RA Area:     31.50 cm LA Vol (A2C): 29.1 ml 14.04 ml/m  RA Volume:   122.00 ml 58.87 ml/m LA Vol (A4C): 48.6 ml 23.45 ml/m  AORTIC VALVE LVOT Vmax:   106.10 cm/s LVOT Vmean:  68.350 cm/s LVOT VTI:    0.118 m  AORTA Ao Root diam: 3.70 cm Ao Asc diam:  3.30 cm MITRAL VALVE               TRICUSPID VALVE MV Area (PHT): 5.01 cm    TR Peak grad:   48.2 mmHg MV Decel Time: 152 msec    TR Vmax:        347.00 cm/s MV E velocity: 87.55 cm/s                            SHUNTS                            Systemic VTI:  0.12 m                            Systemic Diam: 2.40 cm Dani Gobble  Croitoru MD Electronically signed by Sanda Klein MD Signature Date/Time: 04/13/2022/4:06:09 PM    Final    DG Chest Port 1 View  Result Date: 04/13/2022 CLINICAL DATA:  Dyspnea EXAM: PORTABLE CHEST 1 VIEW COMPARISON:  Previous studies including the examination of 04/11/2022 FINDINGS: There is opacification of left hemithorax consistent with left pneumonectomy. Increased interstitial and alveolar markings are seen in right lower lung field with interval worsening. There is improvement in the aeration of right upper lung field. Small right pleural effusion is seen. There is no pneumothorax. IMPRESSION: There is interval worsening of infiltrate in right lower lung fields suggesting worsening pneumonia. There is improvement in the aeration in right upper lung field suggesting decrease in interstitial edema or interstitial pneumonia. Small right pleural effusion. Electronically Signed   By: Elmer Picker M.D.   On: 04/13/2022 10:11    Medications: I have reviewed the patient's current medications.   Assessment/Plan: This is a very pleasant 66 years old white male with stage IV (T1a, N2, M1 C) non-small cell lung cancer, squamous cell carcinoma presented with right upper lobe lung nodule in addition to right hilar and mediastinal lymphadenopathy and metastatic disease to several bone areas as well as musculoskeletal muscles diagnosed in October 2023. The patient also has a history  of a stage IIIa non-small cell lung cancer, squamous cell carcinoma diagnosed in June 2009 status post left pneumonectomy followed by 4 cycles of adjuvant systemic chemotherapy completed in April 29, 2008 and has been on observation since that time. His molecular study showed positive KRAS G12C mutation. The patient is started systemic chemotherapy with carboplatin for AUC of 5, paclitaxel 175 Mg/M2 and Keytruda 200 Mg IV every 3 weeks status post 1 cycle started last week.  He is currently in the hospital with pneumonia  of the right lung.  Of note that the patient had left pneumonectomy. I recommended for the patient to continue his aggressive treatment for the pneumonia as recommended by the primary team. We will continue to monitor his blood count closely. If the patient recovers well from this episode, he will resume his treatment in around 2 weeks. Thank you for taking good care of Mr. Knippenberg, I will continue to follow-up the patient with you and assist in his management on as-needed basis. Disclaimer: This note was dictated with voice recognition software. Similar sounding words can inadvertently be transcribed and may be missed upon review. Eilleen Kempf, MD    LOS: 3 days    Eilleen Kempf 04/14/2022

## 2022-04-14 NOTE — Care Management Important Message (Signed)
Important Message  Patient Details IM Letter given. Name: Benjamin Case MRN: [id number] Date of Birth: [birth year]   Medicare Important Message Given:  Yes     Kerin Salen 04/14/2022, 11:19 AM

## 2022-04-14 NOTE — Consult Note (Addendum)
Cardiology Consultation   Patient ID: Benjamin Case MRN: [id number]; DOB: Jul 29, 1955  Admit date: 04/11/2022 Date of Consult: 04/14/2022  PCP:  Benjamin Case, [facility] Providers Cardiologist:  Benjamin Dresser, MD   Click here to update MD or APP on Care Team, Refresh:1}     Patient Profile:   Benjamin Case is a 66 y.o. male with a hx of squamous cell lung cancer with recurrence, on chemotherapy, hypertension, COPD who is being seen 04/14/2022 for the evaluation of atrial fibrillation at the request of Benjamin. Dwyane Case.  History of Present Illness:   Benjamin Case was admitted on 04/11/22 for shortness of breath, hypoxia, and R lung pneumonia. It was also noted on CT that there appeared to be progression of metastatic lung cancer with lesions in the liver, sternum, vertebrae and progressive lymphadenopathy. BNP, hsTn checked on admission and elevated. Echo with preserved EF, no wall motion abnormalities, moderately elevated PA pressure, severe RA and RV dilation, mild to moderate TR. CT noted enlargement of the main PA consistent with pulmonary hypertension.  He went into new onset atrial fibrillation with RVR the afternoon of 04/12/22. ECG reviewed below.   His wife is present at bedside during my interview. They have never been told that he has an abnormal heart rhythm before. Have never been told that he might have pulmonary hypertension. He is beginning to feel slightly better for the first time today. Per the patient's wife, no plans for thoracentesis at this time.  Discussed atrial fibrillation at length today, see below.  No chest pain. Shortness of breath has been worsening since his bronchoscopy last month but acutely worsened recently. Denies leg edema but did have some abdominal fullness, now improving. No palpitations. No syncope.  Past Medical History:  Diagnosis Date   Arthritis    back   COPD (chronic obstructive pulmonary disease) (Benjamin Case)     Diabetes mellitus without complication (HCC)    no medications , monitors cbg at home with monitor, avg no more than 150s at home    Dyspnea    increased exertion; pt states can climb flight of stairs w/o having to stop prior to reaching top ; ongoing reports it is due to "having one lung"    GERD (gastroesophageal reflux disease)    Hepatitis    hepatitis C; pt states competed treatments, unsure what medication he was treated with    History of chemotherapy    Hypertension    reports its been over a year since he stopped taking bp medication, reports he made her medical doctor aware that he was topping it    lung ca dx'd 12/2007   lt lobectomy   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   and back - see note in [facility] 12/26/21   Numbness    FEET BILATERAL   Pneumonia 2009   Right upper lobe pulmonary nodule 04/29/2021   Rotator cuff tear    right     Past Surgical History:  Procedure Laterality Date   ARTHROSCOPIC REPAIR ACL Right    BRONCHIAL BIOPSY  06/09/2021   Procedure: BRONCHIAL BIOPSIES;  Surgeon: Benjamin Nash, DO;  Location: [facility];  Service: Pulmonary;;   BRONCHIAL BRUSHINGS  06/09/2021   Procedure: BRONCHIAL BRUSHINGS;  Surgeon: Benjamin Nash, DO;  Location: [facility];  Service: Pulmonary;;   BRONCHIAL WASHINGS  06/09/2021   Procedure: BRONCHIAL WASHINGS;  Surgeon: Benjamin Nash, DO;  Location: [facility];  Service: Pulmonary;;  CATARACT EXTRACTION, BILATERAL  2019   DECOMPRESSIVE LUMBAR LAMINECTOMY LEVEL 1 N/A 03/24/2016   Procedure: L3-4 CENTRAL DECOMPRESSION LUMBER LAMINECTOMY;  Surgeon: Benjamin Maudlin, MD;  Location: [facility];  Service: Orthopedics;  Laterality: N/A;   FINE NEEDLE ASPIRATION  03/23/2022   Procedure: FINE NEEDLE ASPIRATION (FNA) LINEAR;  Surgeon: Benjamin Nash, DO;  Location: [facility];  Service: Pulmonary;;   FOOT SURGERY Bilateral    HAND SURGERY Bilateral    LOBECTOMY Left 2009   pneumonectomy Left 2009   [facility]   SHOULDER  ARTHROSCOPY WITH DISTAL CLAVICLE RESECTION Right 12/14/2018   Procedure: SHOULDER ARTHROSCOPY WITH DISTAL CLAVICLE RESECTION;  Surgeon: Benjamin Ade, MD;  Location: [facility];  Service: Orthopedics;  Laterality: Right;   SHOULDER ARTHROSCOPY WITH ROTATOR CUFF REPAIR Right 12/14/2018   Procedure: SHOULDER ARTHROSCOPY WITH ROTATOR CUFF REPAIR;  Surgeon: Benjamin Ade, MD;  Location: [facility];  Service: Orthopedics;  Laterality: Right;   SHOULDER ARTHROSCOPY WITH SUBACROMIAL DECOMPRESSION Right 12/14/2018   Procedure: SHOULDER ARTHROSCOPY WITH SUBACROMIAL DECOMPRESSION;  Surgeon: Benjamin Ade, MD;  Location: [facility];  Service: Orthopedics;  Laterality: Right;   TRIGGER FINGER RELEASE     right hand, pointer finger    VIDEO BRONCHOSCOPY WITH ENDOBRONCHIAL ULTRASOUND Right 03/23/2022   Procedure: VIDEO BRONCHOSCOPY WITH ENDOBRONCHIAL ULTRASOUND;  Surgeon: Benjamin Nash, DO;  Location: [facility];  Service: Pulmonary;  Laterality: Right;   VIDEO BRONCHOSCOPY WITH RADIAL ENDOBRONCHIAL ULTRASOUND  06/09/2021   Procedure: VIDEO BRONCHOSCOPY WITH RADIAL ENDOBRONCHIAL ULTRASOUND;  Surgeon: Benjamin Nash, DO;  Location: [facility];  Service: Pulmonary;;     Home Medications:  Prior to Admission medications   Medication Sig Start Date End Date Taking? Authorizing Provider  ALPRAZolam Benjamin Case) 1 MG tablet Take 1 mg by mouth 3 (three) times daily as needed for anxiety. 04/20/15  Yes [provider]  Ascorbic Acid (VITAMIN C) 1000 MG tablet Take 1,000 mg by mouth daily.   Yes [provider]  aspirin EC 81 MG tablet Take 81 mg by mouth daily.   Yes [provider]  Aspirin-Caffeine (BC FAST PAIN RELIEF) 845-65 MG PACK Take 1 packet by mouth daily as needed (pain.).   Yes [provider]  atorvastatin (LIPITOR) 10 MG tablet Take 10 mg by mouth at bedtime.    Yes [provider]  B Complex-C (SUPER B COMPLEX PO) Take 1 tablet by mouth daily.   Yes  [provider]  esomeprazole (NEXIUM) 40 MG capsule Take 40 mg by mouth daily as needed (for heartburn).    Yes [provider]  guaiFENesin (MUCINEX) 600 MG 12 hr tablet Take 600 mg by mouth 2 (two) times daily.   Yes [provider]  meloxicam (MOBIC) 15 MG tablet Take 15 mg by mouth daily. 03/07/22  Yes [provider]  methocarbamol (ROBAXIN) 500 MG tablet Take 500 mg by mouth 2 (two) times daily as needed for muscle spasms. 03/12/22  Yes [provider]  Omega-3 Fatty Acids ([employer] FISH OIL) 1000 MG CAPS Take 1,000 mg by mouth every evening.   Yes [provider]  oxyCODONE-acetaminophen (PERCOCET) 10-325 MG tablet Take 1 tablet by mouth 5 (five) times daily as needed for pain. 05/04/21  Yes [provider]  pregabalin (LYRICA) 75 MG capsule Take 75 mg by mouth 2 (two) times daily. 02/22/22  Yes [provider]  prochlorperazine (COMPAZINE) 10 MG tablet Take 1 tablet (10 mg total) by mouth every 6 (six) hours as needed for  nausea or vomiting. 04/01/22  Yes Curt Bears, MD  vitamin B-12 (CYANOCOBALAMIN) 1000 MCG tablet Take 1,000 mcg by mouth daily.   Yes [provider]  ipratropium-albuterol (DUONEB) 0.5-2.5 (3) MG/3ML SOLN Take 3 mLs by nebulization 2 (two) times daily.    [provider]    Inpatient Medications: Scheduled Meds:  enoxaparin (LOVENOX) injection  1 mg/kg Subcutaneous Y07P   folic acid  1 mg Oral Daily   guaiFENesin  600 mg Oral BID   insulin aspart  0-5 Units Subcutaneous QHS   insulin aspart  0-9 Units Subcutaneous TID WC   ipratropium-albuterol  3 mL Nebulization QID   loratadine  10 mg Oral Daily   methylPREDNISolone (SOLU-MEDROL) injection  40 mg Intravenous Q24H   multivitamin with minerals  1 tablet Oral Daily   thiamine  100 mg Oral Daily   Or   thiamine  100 mg Intravenous Daily   Continuous Infusions:  ceFEPime (MAXIPIME) IV 2 g (04/14/22 1244)   diltiazem (CARDIZEM)  infusion 15 mg/hr (04/14/22 1100)   PRN Meds: acetaminophen **OR** acetaminophen, ALPRAZolam, guaiFENesin-dextromethorphan, hydrALAZINE, ipratropium-albuterol, LORazepam **OR** LORazepam, ondansetron **OR** ondansetron (ZOFRAN) IV, mouth rinse, oxyCODONE-acetaminophen **AND** oxyCODONE, zolpidem  Allergies:   No Known Allergies  Social History:   Social History   Socioeconomic History   Marital status: Married    Spouse name: Vaughan Basta   Number of children: 2   Years of education: 11   Highest education level: Not on file  Occupational History    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, Probation officer  Tobacco Use   Smoking status: Every Day    Packs/day: 0.50    Years: 45.00    Total pack years: 22.50    Types: Cigarettes   Smokeless tobacco: Never   Tobacco comments:    08/04/16 3-6 cigs daily 02/22/2022 [place]    1/2 -3/4 ppd as of 03/22/22  Vaping Use   Vaping Use: Never used  Substance and Sexual Activity   Alcohol use: Yes    Comment: whiskey 4 drinks/day   Drug use: No   Sexual activity: Yes  Other Topics Concern   Not on file  Social History Narrative   Lives with wife   Caffeine - coffee, 1 cup daily   Social Determinants of Health   Financial Resource Strain: Not on file  Food Insecurity: No Food Insecurity (04/11/2022)   Hunger Vital Sign    Worried About Running Out of Food in the Last Year: Never true    Ran Out of Food in the Last Year: Never true  Transportation Needs: No Transportation Needs (04/11/2022)   PRAPARE - Hydrologist (Medical): No    Lack of Transportation (Non-Medical): No  Physical Activity: Not on file  Stress: Not on file  Social Connections: Not on file  Intimate Partner Violence: Not At Risk (04/11/2022)   Humiliation, Afraid, Rape, and Kick questionnaire    Fear of Current or Ex-Partner: No    Emotionally Abused: No    Physically Abused: No    Sexually Abused: No    Family History:    Family History  Problem Relation Age  of Onset   Cancer Mother      ROS:  Please see the history of present illness.  Constitutional: Negative for chills, fever, night sweats, unintentional weight loss  HENT: Negative for ear pain and hearing loss.   Eyes: Negative for loss of vision and eye pain.  Respiratory: Positive for cough, sputum, shortness of  breath  Cardiovascular: See HPI. Gastrointestinal: Negative for abdominal pain, melena, and hematochezia.  Genitourinary: Negative for dysuria and hematuria.  Musculoskeletal: Negative for falls and myalgias.  Skin: Negative for itching and rash.  Neurological: Negative for focal weakness, focal sensory changes and loss of consciousness.  Endo/Heme/Allergies: Does not bruise/bleed easily.   All other ROS reviewed and negative.     Physical Exam/Data:   Vitals:   04/14/22 0441 04/14/22 0801 04/14/22 1116 04/14/22 1149  BP: 117/67   103/62  Pulse: 82   74  Resp: 17   19  Temp: 98.6 F (37 C)   98.3 F (36.8 C)  TempSrc: Oral   Oral  SpO2: 96% 96% 95% 93%  Weight:      Height:        Intake/Output Summary (Last 24 hours) at 04/14/2022 1546 Last data filed at 04/14/2022 1100 Gross per 24 hour  Intake 1476.65 ml  Output 1450 ml  Net 26.65 ml      04/11/2022    6:13 PM 04/11/2022    1:39 PM 04/01/2022    2:45 PM  Last 3 Weights  Weight (lbs) 196 lb 10.4 oz 178 lb 176 lb 6 oz  Weight (kg) 89.2 kg 80.74 kg 80.003 kg     Body mass index is 28.22 kg/m.  General:  Well nourished, well developed, in no acute distress. Nasal cannula in place. HEENT: normal Neck: JVD not well visualized 2/2 body habitus Vascular: No carotid bruits; Distal pulses 2+ bilaterally Cardiac:  normal S1, S2; RRR; no murmur  Lungs:  s/p left lung resection previously. Coarse breath sounds throughout right side, diminished at base Abd: soft, nontender, no hepatomegaly  Ext: no edema Musculoskeletal:  No deformities, BUE and BLE strength normal and equal Skin: warm and dry  Neuro:  CNs  2-12 intact, no focal abnormalities noted Psych:  Normal affect   EKG:  The EKG was personally reviewed and demonstrates:  04/12/22 atrial fibrillation with RVR at 132 bpm Telemetry:  Telemetry was personally reviewed and demonstrates:  currently in NSR. Has had paroxysmal atrial fibrillation  Relevant CV Studies: Echo 04/13/22  1. Left ventricular ejection fraction, by estimation, is 55 to 60%. The  left ventricle has normal function. The left ventricle has no regional  wall motion abnormalities. Left ventricular diastolic function could not  be evaluated.   2. Right ventricular systolic function is normal. The right ventricular  size is severely enlarged. There is moderately elevated pulmonary artery  systolic pressure.   3. Right atrial size was severely dilated.   4. The mitral valve is normal in structure. No evidence of mitral valve  regurgitation. No evidence of mitral stenosis.   5. Tricuspid valve regurgitation is mild to moderate.   6. The aortic valve is tricuspid. Aortic valve regurgitation is not  visualized. No aortic stenosis is present.   7. The inferior vena cava is dilated in size with >50% respiratory  variability, suggesting right atrial pressure of 8 mmHg.   Laboratory Data:  High Sensitivity Troponin:   Recent Labs  Lab 04/11/22 1427 04/11/22 1839 04/12/22 0308 04/13/22 0923  TROPONINIHS 529* 594* 437* 235*     Chemistry Recent Labs  Lab 04/12/22 0308 04/13/22 0319 04/14/22 0540  NA 130* 128* 128*  K 4.0 3.8 3.6  CL 97* 95* 86*  CO2 25 25 29   GLUCOSE 140* 141* 168*  BUN 22 17 27*  CREATININE 0.90 0.82 1.13  CALCIUM 8.0* 8.3* 9.1  MG  --   --  1.3*  GFRNONAA >60 >60 >60  ANIONGAP 8 8 13     Recent Labs  Lab 04/08/22 1025 04/11/22 1427  PROT 7.4 7.0  ALBUMIN 4.3 3.8  AST 28 44*  ALT 26 39  ALKPHOS 52 54  BILITOT 0.9 1.2   Lipids No results for input(s): "CHOL", "TRIG", "HDL", "LABVLDL", "LDLCALC", "CHOLHDL" in the last 168 hours.   Hematology Recent Labs  Lab 04/12/22 0308 04/13/22 0319 04/14/22 0540  WBC 32.6* 26.7* 20.3*  RBC 3.63* 3.66* 3.58*  HGB 12.4* 12.5* 12.2*  HCT 35.8* 36.0* 33.9*  MCV 98.6 98.4 94.7  MCH 34.2* 34.2* 34.1*  MCHC 34.6 34.7 36.0  RDW 11.3* 11.3* 10.9*  PLT 181 179 188   Thyroid  Recent Labs  Lab 04/08/22 1024  TSH 0.447    BNP Recent Labs  Lab 04/11/22 1428 04/13/22 0923  BNP 552.8* 525.5*    DDimer No results for input(s): "DDIMER" in the last 168 hours.   Radiology/Studies:  ECHOCARDIOGRAM COMPLETE  Result Date: 04/13/2022    ECHOCARDIOGRAM REPORT   Patient Name:   AMIER HOYT Date of Exam: 04/13/2022 Medical Rec #:  [id number]       Height:       70.0 in Accession #:    [id number]      Weight:       196.6 lb Date of Birth:  May 30, 1956       BSA:          2.072 m Patient Age:    57 years        BP:           104/71 mmHg Patient Gender: M               HR:           120 bpm. Exam Location:  Inpatient Procedure: 2D Echo, Cardiac Doppler, Color Doppler and Intracardiac            Opacification Agent Indications:    R94.31 Abnormal EKG  History:        Patient has no prior history of Echocardiogram examinations.                 COPD, Signs/Symptoms:Shortness of Breath and Dyspnea; Risk                 Factors:Current Smoker. Lung cancer. ETOH.  Sonographer:    Roseanna Rainbow RDCS Referring Phys: [id number] [facility]  Sonographer Comments: Technically difficult study due to poor echo windows. Image acquisition challenging due to patient body habitus. IMPRESSIONS  1. Left ventricular ejection fraction, by estimation, is 55 to 60%. The left ventricle has normal function. The left ventricle has no regional wall motion abnormalities. Left ventricular diastolic function could not be evaluated.  2. Right ventricular systolic function is normal. The right ventricular size is severely enlarged. There is moderately elevated pulmonary artery systolic pressure.  3. Right atrial size was severely  dilated.  4. The mitral valve is normal in structure. No evidence of mitral valve regurgitation. No evidence of mitral stenosis.  5. Tricuspid valve regurgitation is mild to moderate.  6. The aortic valve is tricuspid. Aortic valve regurgitation is not visualized. No aortic stenosis is present.  7. The inferior vena cava is dilated in size with >50% respiratory variability, suggesting right atrial pressure of 8 mmHg. FINDINGS  Left Ventricle: Left ventricular ejection fraction, by estimation, is 55 to 60%. The left ventricle has normal function. The left ventricle has no  regional wall motion abnormalities. The left ventricular internal cavity size was normal in size. There is  no left ventricular hypertrophy. Left ventricular diastolic function could not be evaluated due to atrial fibrillation. Left ventricular diastolic function could not be evaluated. Right Ventricle: The right ventricular size is severely enlarged. No increase in right ventricular wall thickness. Right ventricular systolic function is normal. There is moderately elevated pulmonary artery systolic pressure. The tricuspid regurgitant velocity is 3.47 m/s, and with an assumed right atrial pressure of 8 mmHg, the estimated right ventricular systolic pressure is 17.5 mmHg. Left Atrium: Left atrial size was normal in size. Right Atrium: Right atrial size was severely dilated. Pericardium: There is no evidence of pericardial effusion. Mitral Valve: The mitral valve is normal in structure. No evidence of mitral valve regurgitation. No evidence of mitral valve stenosis. Tricuspid Valve: The tricuspid valve is normal in structure. Tricuspid valve regurgitation is mild to moderate. No evidence of tricuspid stenosis. Aortic Valve: The aortic valve is tricuspid. Aortic valve regurgitation is not visualized. No aortic stenosis is present. Pulmonic Valve: The pulmonic valve was normal in structure. Pulmonic valve regurgitation is not visualized. No evidence of  pulmonic stenosis. Aorta: The aortic root is normal in size and structure. Venous: The inferior vena cava is dilated in size with greater than 50% respiratory variability, suggesting right atrial pressure of 8 mmHg. IAS/Shunts: No atrial level shunt detected by color flow Doppler.  LEFT VENTRICLE PLAX 2D LVIDd:         4.20 cm LVIDs:         3.00 cm LV PW:         1.10 cm LV IVS:        0.96 cm LVOT diam:     2.40 cm LV SV:         53 LV SV Index:   26 LVOT Area:     4.52 cm  LV Volumes (MOD) LV vol d, MOD A4C: 58.8 ml LV vol s, MOD A4C: 18.5 ml LV SV MOD A4C:     58.8 ml RIGHT VENTRICLE             IVC RV S prime:     12.60 cm/s  IVC diam: 2.10 cm TAPSE (M-mode): 1.9 cm LEFT ATRIUM           Index        RIGHT ATRIUM           Index LA diam:      3.40 cm 1.64 cm/m   RA Area:     31.50 cm LA Vol (A2C): 29.1 ml 14.04 ml/m  RA Volume:   122.00 ml 58.87 ml/m LA Vol (A4C): 48.6 ml 23.45 ml/m  AORTIC VALVE LVOT Vmax:   106.10 cm/s LVOT Vmean:  68.350 cm/s LVOT VTI:    0.118 m  AORTA Ao Root diam: 3.70 cm Ao Asc diam:  3.30 cm MITRAL VALVE               TRICUSPID VALVE MV Area (PHT): 5.01 cm    TR Peak grad:   48.2 mmHg MV Decel Time: 152 msec    TR Vmax:        347.00 cm/s MV E velocity: 87.55 cm/s                            SHUNTS  Systemic VTI:  0.12 m                            Systemic Diam: 2.40 cm Sanda Klein MD Electronically signed by Sanda Klein MD Signature Date/Time: 04/13/2022/4:06:09 PM    Final    DG Chest Port 1 View  Result Date: 04/13/2022 CLINICAL DATA:  Dyspnea EXAM: PORTABLE CHEST 1 VIEW COMPARISON:  Previous studies including the examination of 04/11/2022 FINDINGS: There is opacification of left hemithorax consistent with left pneumonectomy. Increased interstitial and alveolar markings are seen in right lower lung field with interval worsening. There is improvement in the aeration of right upper lung field. Small right pleural effusion is seen. There is no  pneumothorax. IMPRESSION: There is interval worsening of infiltrate in right lower lung fields suggesting worsening pneumonia. There is improvement in the aeration in right upper lung field suggesting decrease in interstitial edema or interstitial pneumonia. Small right pleural effusion. Electronically Signed   By: Elmer Picker M.D.   On: 04/13/2022 10:11   CT Angio Chest PE W and/or Wo Contrast  Result Date: 04/11/2022 CLINICAL DATA:  Pulmonary embolism (PE) suspected, unknown D-dimer Shortness of breath. Radiologic records indicates history of lung cancer, prior pneumonectomy. EXAM: CT ANGIOGRAPHY CHEST WITH CONTRAST TECHNIQUE: Multidetector CT imaging of the chest was performed using the standard protocol during bolus administration of intravenous contrast. Multiplanar CT image reconstructions and MIPs were obtained to evaluate the vascular anatomy. RADIATION DOSE REDUCTION: This exam was performed according to the departmental dose-optimization program which includes automated exposure control, adjustment of the mA and/or kV according to patient size and/or use of iterative reconstruction technique. CONTRAST:  57mL OMNIPAQUE IOHEXOL 350 MG/ML SOLN COMPARISON:  Chest radiograph earlier today. CT 03/17/2022. PET CT 03/12/2022 FINDINGS: Cardiovascular: There are no filling defects within the pulmonary arteries to suggest pulmonary embolus. Chronic central enlargement of the main pulmonary artery. Grossly normal heart size, shifted to the left due to pneumonectomy changes. There are coronary artery calcifications. Atherosclerosis of the thoracic aorta without aneurysm or dissection. Mediastinum/Nodes: Enlarging precarinal node measuring 2.8 x 4.2 cm, series 5, image 52, previously 3.6 x 3 cm. 19 mm subcarinal node series 5, image 65, previously 18 mm. There is a 2 x 4 cm right hilar node series 5, image 66, comparison with prior is limited due to lack contrast on that exam. Low right hilar nodes measure  up to 12 mm. Scattered retained debris in the esophagus. Stable volume loss in the left hemithorax post pneumonectomy with mediastinal shift. 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 Small to moderate-sized right pleural effusion is new from prior exam. There are new areas of ground-glass and reticulonodular opacities throughout the right lung, predominantly involving the right upper and middle lobes. Previous right upper lobe cavitary nodule is not well assessed on the current exam due to motion, in the region of series 11, image 44. r medial right apical nodule has increased in size 13 x 9 mm series 11, image 34, previously 10 x 7 mm. Enlarging right apical nodule at 8 mm series 11, image 27, previously 4 mm. There is narrowing of the right mainstem bronchus with middle and lower lobe bronchial thickening. Occasional areas of mucoid impaction in the lower lobe. Breathing motion artifact limits detailed assessment. Upper Abdomen: Low-density liver lesions in the left lobe is unchanged from recent chest CT, series 5, image 125 there are multiple additional low-density lesions  in the right and left hepatic lobe. These are not definitively seen on recent CT under typical of metastatic disease. Musculoskeletal: Patient with known osseous metastatic disease. Lucency involving the posterior aspect of T3 vertebral body, series 8, image 111. Lucent lesion in the manubrium is again seen. Left lateral ninth rib fracture is new. There are multiple remote left rib fractures and postsurgical change in the left hemithorax. Review of the MIP images confirms the above findings. IMPRESSION: 1. No pulmonary embolus. 2. New areas of ground-glass and reticulonodular opacities throughout the right lung, predominantly involving the right upper and middle lobes. This is likely infectious, including atypical viral organisms. 3. Small to moderate right pleural effusion is new from prior  exam. 4. Right hilar adenopathy causes narrowing of the right mainstem bronchus. 5. Enlarging mediastinal adenopathy consistent with worsening nodal disease. 6. Enlarging nodules in the right upper lobe. One of the nodules on prior chest CT is obscured on the current exam due to motion. 7. Left lateral ninth rib fracture is new. Osseous metastatic disease involving the sternum and T3 vertebral body, better demonstrated on prior PET again seen. 8. There are multiple low-density lesions in the liver that were not definitively seen on recent prior, and suspicious for metastatic disease. This is not well assessed on this phase of contrast tailored to pulmonary artery assessment. Aortic Atherosclerosis (ICD10-I70.0). Electronically Signed   By: Keith Rake M.D.   On: 04/11/2022 16:25   DG Chest 2 View  Result Date: 04/11/2022 CLINICAL DATA:  Shortness of breath. History of LEFT pneumonectomy for LEFT lung cancer. EXAM: CHEST - 2 VIEW COMPARISON:  03/17/2022 CT, 06/09/2021 radiograph and prior studies FINDINGS: LEFT pneumonectomy changes again noted. Interstitial and airspace opacities within the RIGHT lung are now noted. A trace RIGHT pleural effusions present. There is no evidence of pneumothorax. No acute bony abnormalities are identified. IMPRESSION: Interstitial and airspace opacities within the RIGHT lung with trace RIGHT pleural effusion. This may represent pulmonary edema or infection. Electronically Signed   By: Margarette Canada M.D.   On: 04/11/2022 14:23     Assessment and Plan:   Multifocal pneumonia with acute hypoxic respiratory failure Bilateral pleural effusions Lung cancer s/p left pneumonectomy, now with suspected metastatic recurrence -reviewed at length today -he was still smoking prior to admission but notes now he plans to completely quit -management per primary team  Atrial fibrillation, paroxysmal -chads vasc=2 (history of hypertension, but not currently receiving treatment) -we  discussed afib at length today -tolerating therapeutic lovenox, would transition to [place] if no procedures planned -discussed that pulmonary inflammation can often trigger atrial arrhythmias (SVT also seen briefly on telemetry), and given this he may have recurrent atrial fibrillation episodes in the future. Would continue DOAC as long as he tolerates, monitor CBC with chemotherapy -managed well on IV diltiazem currently. Will maintain for now, can consolidate to oral tomorrow if stable  Severely enlarged RA/RV Enlargement of PA on CT -suggests pulmonary hypertension -he denies being told he has pulmonary hypertension in the past -no significant peripheral edema on exam, discussed what to watch for -may need PRN diuretic at discharge  Coronary calcification Aortic atherosclerosis -low priority currently in the setting of multiple medical comorbidities, above   Risk Assessment/Risk Scores:          CHA2DS2-VASc Score = 2   This indicates a 2.2% annual risk of stroke. The patient's score is based upon: CHF History: 0 HTN History: 1 Diabetes History: 0 Stroke History: 0 Vascular  Disease History: 0 Age Score: 1 Gender Score: 0         For questions or updates, please contact [place] Please consult [email or web address] for contact info under    Signed, Benjamin Dresser, MD  04/14/2022 3:46 PM

## 2022-04-14 NOTE — TOC Progression Note (Signed)
Transition of Care [facility]) - Progression Note    Patient Details  Name: Benjamin Case MRN: [id number] Date of Birth: [birth year]  Transition of Care [facility] CM/SW Contact  Jamiah Homeyer, Juliann Pulse, RN Phone Number: 04/14/2022, 10:19 AM  Clinical Narrative: SA resources added to d/c instructions.      Expected Discharge Plan: Home/Self Care Barriers to Discharge: Continued Medical Work up  Expected Discharge Plan and Services Expected Discharge Plan: Home/Self Care   Discharge Planning Services: CM Consult   Living arrangements for the past 2 months: Single Family Home                                       Social Determinants of Health (SDOH) Interventions    Readmission Risk Interventions     No data to display

## 2022-04-14 NOTE — Evaluation (Signed)
Physical Therapy Evaluation Patient Details Name: Benjamin Case MRN: [id number] DOB: 28-Apr-1956 Today's Date: 04/14/2022  History of Present Illness  Patient is 66 y.o. mal admitted on 04/11/22 for shortness of breath, hypoxia, and R lung pneumonia. It was also noted on CT that there appeared to be progression of metastatic lung cancer with lesions in the liver, sternum, vertebrae and progressive lymphadenopathy. On 04/12/22 pt noted to have new onset of Afib with RVR. PMH significant of squamous cell lung cancer with recurrence, on chemotherapy, hypertension, COPD.   Clinical Impression  Benjamin Case is 66 y.o. male admitted with above HPI and diagnosis. Patient is currently limited by functional impairments below (see PT problem list). Patient lives with his wife and is independent at baseline. Currently he requires min guard/supervision for safety with transfers and gait. Pt on 2L/min with mobiltiy and SpO2 drops to 87% during ambulation. Noted pt is at 95% resting on RA, placed back on 2L (humidified) and RN notified. Patient will benefit from continued skilled PT interventions to address impairments and progress independence with mobility, recommending HHPT. Acute PT will follow and progress as able.        Recommendations for follow up therapy are one component of a multi-disciplinary discharge planning process, led by the attending physician.  Recommendations may be updated based on patient status, additional functional criteria and insurance authorization.  Follow Up Recommendations Home health PT      Assistance Recommended at Discharge Set up Supervision/Assistance  Patient can return home with the following  A little help with walking and/or transfers;A little help with bathing/dressing/bathroom;Assist for transportation;Help with stairs or ramp for entrance;Assistance with cooking/housework    Equipment Recommendations None recommended by PT  Recommendations for Other Services        Functional Status Assessment Patient has had a recent decline in their functional status and demonstrates the ability to make significant improvements in function in a reasonable and predictable amount of time.     Precautions / Restrictions Precautions Precautions: Fall Restrictions Weight Bearing Restrictions: No      Mobility  Bed Mobility Overal bed mobility: Needs Assistance Bed Mobility: Supine to Sit     Supine to sit: Supervision, HOB elevated     General bed mobility comments: extra time, use of bed rail. supervision for safety.    Transfers Overall transfer level: Needs assistance Equipment used: None Transfers: Sit to/from Stand Sit to Stand: Min guard           General transfer comment: gaurding for safety, pt using bil UE to power up    Ambulation/Gait Ambulation/Gait assistance: Min guard, Supervision Gait Distance (Feet): 200 Feet Assistive device: IV Pole, None Gait Pattern/deviations: Step-through pattern, Decreased stride length Gait velocity: decr     General Gait Details: no LOB noted throughout. overall pt stedy but slighty uneven step pattern, pt has neuropathy. cues for safe hand placement on IV pole to steady self, min gaurd/supervision for safety.  Stairs            Wheelchair Mobility    Modified Rankin (Stroke Patients Only)       Balance                                             Pertinent Vitals/Pain Pain Assessment Pain Assessment: No/denies pain    Home Living Family/patient expects to be discharged  to:: Private residence Living Arrangements: Spouse/significant other Available Help at Discharge: Family Type of Home: House Home Access: Stairs to enter Entrance Stairs-Rails: Psychiatric nurse of Steps: [place]: Conservation officer, nature (2 wheels);Cane - single point      Prior Function Prior Level of Function : Independent/Modified Independent                      Hand Dominance        Extremity/Trunk Assessment   Upper Extremity Assessment Upper Extremity Assessment: Overall WFL for tasks assessed    Lower Extremity Assessment Lower Extremity Assessment: Overall WFL for tasks assessed    Cervical / Trunk Assessment Cervical / Trunk Assessment: Normal  Communication   Communication: No difficulties  Cognition Arousal/Alertness: Awake/alert Behavior During Therapy: WFL for tasks assessed/performed Overall Cognitive Status: Within Functional Limits for tasks assessed                                          General Comments      Exercises     Assessment/Plan    PT Assessment Patient needs continued PT services  PT Problem List Decreased activity tolerance;Decreased balance;Decreased mobility;Cardiopulmonary status limiting activity;Decreased knowledge of use of DME;Decreased safety awareness       PT Treatment Interventions DME instruction;Gait training;Stair training;Functional mobility training;Therapeutic activities;Therapeutic exercise;Balance training;Patient/family education    PT Goals (Current goals can be found in the Care Plan section)  Acute Rehab PT Goals Patient Stated Goal: get home and off oxygen PT Goal Formulation: With patient/family Time For Goal Achievement: 04/28/22 Potential to Achieve Goals: Good    Frequency Min 3X/week     Co-evaluation               AM-PAC PT "6 Clicks" Mobility  Outcome Measure Help needed turning from your back to your side while in a flat bed without using bedrails?: None Help needed moving from lying on your back to sitting on the side of a flat bed without using bedrails?: None Help needed moving to and from a bed to a chair (including a wheelchair)?: A Little Help needed standing up from a chair using your arms (e.g., wheelchair or bedside chair)?: A Little Help needed to walk in hospital room?: A Little Help needed climbing 3-5 steps  with a railing? : A Little 6 Click Score: 20    End of Session Equipment Utilized During Treatment: Gait belt;Oxygen Activity Tolerance: Patient tolerated treatment well Patient left: in chair;with call bell/phone within reach;with family/visitor present Nurse Communication: Mobility status PT Visit Diagnosis: Unsteadiness on feet (R26.81);Other abnormalities of gait and mobility (R26.89);Difficulty in walking, not elsewhere classified (R26.2)    Time: 1455-1520 PT Time Calculation (min) (ACUTE ONLY): 25 min   Charges:   PT Evaluation $PT Eval Moderate Complexity: 1 Mod PT Treatments $Gait Training: 8-22 mins        Verner Mould, DPT Acute Rehabilitation Services Office [phone]  04/14/22 4:20 PM

## 2022-04-14 NOTE — Progress Notes (Signed)
ANTICOAGULATION CONSULT NOTE - Follow Up Consult  Pharmacy Consult for lovenox --> Eliquis Indication:new onset atrial fibrillation  No Known Allergies  Patient Measurements: Height: 5\' 10"  (177.8 cm) Weight: 89.2 kg (196 lb 10.4 oz) IBW/kg (Calculated) : 73 Heparin Dosing Weight:   Vital Signs: Temp: 98.3 F (36.8 C) (11/08 1149) Temp Source: Oral (11/08 1149) BP: 103/62 (11/08 1149) Pulse Rate: 74 (11/08 1149)  Labs: Recent Labs    04/11/22 1839 04/12/22 0308 04/12/22 0308 04/13/22 0319 04/13/22 0923 04/14/22 0540  HGB  --  12.4*   < > 12.5*  --  12.2*  HCT  --  35.8*  --  36.0*  --  33.9*  PLT  --  181  --  179  --  188  CREATININE  --  0.90  --  0.82  --  1.13  TROPONINIHS 594* 437*  --   --  235*  --    < > = values in this interval not displayed.    Estimated Creatinine Clearance: 70.4 mL/min (by C-G formula based on SCr of 1.13 mg/dL).   Assessment: Patient is a 66 y.o M with lung cancer on chemotherapy PTA who presented to the ED on 04/11/22 with c/o SOB and was found to have PNA and pleural effusion.  He went info afib with RVR on 04/12/22 and was started on full dose lovenox.  Pharmacy has been consulted on 04/14/22 to transition patient to Eliquis.  Today, 04/14/2022: - cbc ok - scr 1.13 - no bleeding documented -l last dose of lovenox 100 mg given to patient at 6:35a this morning  Plan:  - d/c lovenox - start Eliquis 5 mg bid at 6p tonight - pharmacy will sign off for Eliquis but will follow patient peripherally along with you  Clariece Roesler P 04/14/2022,4:15 PM

## 2022-04-15 ENCOUNTER — Other Ambulatory Visit: Payer: Medicare Other

## 2022-04-15 ENCOUNTER — Encounter: Payer: Self-pay | Admitting: Internal Medicine

## 2022-04-15 ENCOUNTER — Telehealth (HOSPITAL_COMMUNITY): Payer: Self-pay | Admitting: Pharmacy Technician

## 2022-04-15 ENCOUNTER — Ambulatory Visit: Payer: Medicare Other | Admitting: Physician Assistant

## 2022-04-15 ENCOUNTER — Other Ambulatory Visit (HOSPITAL_COMMUNITY): Payer: Self-pay

## 2022-04-15 DIAGNOSIS — R0600 Dyspnea, unspecified: Secondary | ICD-10-CM

## 2022-04-15 DIAGNOSIS — C3491 Malignant neoplasm of unspecified part of right bronchus or lung: Secondary | ICD-10-CM | POA: Diagnosis not present

## 2022-04-15 DIAGNOSIS — R0902 Hypoxemia: Secondary | ICD-10-CM | POA: Diagnosis not present

## 2022-04-15 DIAGNOSIS — I480 Paroxysmal atrial fibrillation: Secondary | ICD-10-CM | POA: Diagnosis not present

## 2022-04-15 DIAGNOSIS — J189 Pneumonia, unspecified organism: Secondary | ICD-10-CM | POA: Diagnosis not present

## 2022-04-15 LAB — BASIC METABOLIC PANEL
Anion gap: 8 (ref 5–15)
BUN: 40 mg/dL — ABNORMAL HIGH (ref 8–23)
CO2: 29 mmol/L (ref 22–32)
Calcium: 8.5 mg/dL — ABNORMAL LOW (ref 8.9–10.3)
Chloride: 88 mmol/L — ABNORMAL LOW (ref 98–111)
Creatinine, Ser: 1.18 mg/dL (ref 0.61–1.24)
GFR, Estimated: >60 mL/min (ref >60)
Glucose, Bld: 133 mg/dL — ABNORMAL HIGH (ref 70–99)
Potassium: 3.9 mmol/L (ref 3.5–5.1)
Sodium: 125 mmol/L — ABNORMAL LOW (ref 135–145)

## 2022-04-15 LAB — CBC
HCT: 30.3 % — ABNORMAL LOW (ref 39.0–52.0)
Hemoglobin: 10.8 g/dL — ABNORMAL LOW (ref 13.0–17.0)
MCH: 33.9 pg (ref 26.0–34.0)
MCHC: 35.6 g/dL (ref 30.0–36.0)
MCV: 95 fL (ref 80.0–100.0)
Platelets: 174 10*3/uL (ref 150–400)
RBC: 3.19 MIL/uL — ABNORMAL LOW (ref 4.22–5.81)
RDW: 11 % — ABNORMAL LOW (ref 11.5–15.5)
WBC: 25.7 10*3/uL — ABNORMAL HIGH (ref 4.0–10.5)
nRBC: 0 % (ref 0.0–0.2)

## 2022-04-15 LAB — GLUCOSE, CAPILLARY
Glucose-Capillary: 177 mg/dL — ABNORMAL HIGH (ref 70–99)
Glucose-Capillary: 143 mg/dL — ABNORMAL HIGH (ref 70–99)
Glucose-Capillary: 153 mg/dL — ABNORMAL HIGH (ref 70–99)
Glucose-Capillary: 261 mg/dL — ABNORMAL HIGH (ref 70–99)

## 2022-04-15 LAB — PHOSPHORUS: Phosphorus: 3.4 mg/dL (ref 2.5–4.6)

## 2022-04-15 LAB — MAGNESIUM: Magnesium: 1.8 mg/dL (ref 1.7–2.4)

## 2022-04-15 MED ORDER — DILTIAZEM HCL ER COATED BEADS 240 MG PO CP24
240.0000 mg | ORAL_CAPSULE | Freq: Every day | ORAL | Status: DC
  Administered 2022-04-15 – 2022-04-16 (×2): 240 mg via ORAL
  Filled 2022-04-15 (×2): qty 1

## 2022-04-15 NOTE — Progress Notes (Signed)
Rounding Note    Patient Name: Benjamin Case Date of Encounter: 04/15/2022  [place] Cardiologist: Buford Dresser, MD   Subjective   Still feels short of breath, O2 sats dropped with ambulation. Anxious to go home. Has remained in SR with occasional PVCs  Inpatient Medications    Scheduled Meds:  apixaban  5 mg Oral BID   diltiazem  240 mg Oral Daily   folic acid  1 mg Oral Daily   guaiFENesin  600 mg Oral BID   insulin aspart  0-5 Units Subcutaneous QHS   insulin aspart  0-9 Units Subcutaneous TID WC   ipratropium-albuterol  3 mL Nebulization QID   loratadine  10 mg Oral Daily   methylPREDNISolone (SOLU-MEDROL) injection  40 mg Intravenous Q24H   multivitamin with minerals  1 tablet Oral Daily   thiamine  100 mg Oral Daily   Or   thiamine  100 mg Intravenous Daily   Continuous Infusions:  ceFEPime (MAXIPIME) IV Stopped (04/15/22 0544)   PRN Meds: acetaminophen **OR** acetaminophen, ALPRAZolam, guaiFENesin-dextromethorphan, hydrALAZINE, ipratropium-albuterol, LORazepam **OR** LORazepam, ondansetron **OR** ondansetron (ZOFRAN) IV, mouth rinse, oxyCODONE-acetaminophen **AND** oxyCODONE, zolpidem   Vital Signs    Vitals:   04/14/22 1947 04/15/22 0504 04/15/22 0739 04/15/22 1323  BP: 110/69 105/66  133/75  Pulse: 92 72  94  Resp: 17 18  20   Temp: 98 F (36.7 C) 97.8 F (36.6 C)  98.8 F (37.1 C)  TempSrc: Oral Oral  Oral  SpO2: 99% 97% 96% 95%  Weight:      Height:        Intake/Output Summary (Last 24 hours) at 04/15/2022 1334 Last data filed at 04/15/2022 1320 Gross per 24 hour  Intake 540 ml  Output 1075 ml  Net -535 ml      04/11/2022    6:13 PM 04/11/2022    1:39 PM 04/01/2022    2:45 PM  Last 3 Weights  Weight (lbs) 196 lb 10.4 oz 178 lb 176 lb 6 oz  Weight (kg) 89.2 kg 80.74 kg 80.003 kg      Telemetry    Sinus with rare PVCs - Personally Reviewed  ECG    No new since 11/6 - Personally Reviewed  Physical Exam    GEN: No acute distress.   in place Neck: No JVD Cardiac: RRR, no murmurs, rubs, or gallops.  Respiratory: s/p left pneumonectomy. Coarse breath sounds on R GI: Soft, nontender, non-distended  MS: No edema; No deformity. Neuro:  Nonfocal  Psych: Normal affect   Labs    High Sensitivity Troponin:   Recent Labs  Lab 04/11/22 1427 04/11/22 1839 04/12/22 0308 04/13/22 0923  TROPONINIHS 529* 594* 437* 235*     Chemistry Recent Labs  Lab 04/11/22 1427 04/12/22 0308 04/13/22 0319 04/14/22 0540 04/15/22 0419  NA 128*   < > 128* 128* 125*  K 4.1   < > 3.8 3.6 3.9  CL 91*   < > 95* 86* 88*  CO2 27   < > 25 29 29   GLUCOSE 158*   < > 141* 168* 133*  BUN 31*   < > 17 27* 40*  CREATININE 1.21   < > 0.82 1.13 1.18  CALCIUM 8.4*   < > 8.3* 9.1 8.5*  MG  --   --   --  1.3* 1.8  PROT 7.0  --   --   --   --   ALBUMIN 3.8  --   --   --   --  AST 44*  --   --   --   --   ALT 39  --   --   --   --   ALKPHOS 54  --   --   --   --   BILITOT 1.2  --   --   --   --   GFRNONAA >60   < > >60 >60 >60  ANIONGAP 10   < > 8 13 8    < > = values in this interval not displayed.    Lipids No results for input(s): "CHOL", "TRIG", "HDL", "LABVLDL", "LDLCALC", "CHOLHDL" in the last 168 hours.  Hematology Recent Labs  Lab 04/13/22 0319 04/14/22 0540 04/15/22 0419  WBC 26.7* 20.3* 25.7*  RBC 3.66* 3.58* 3.19*  HGB 12.5* 12.2* 10.8*  HCT 36.0* 33.9* 30.3*  MCV 98.4 94.7 95.0  MCH 34.2* 34.1* 33.9  MCHC 34.7 36.0 35.6  RDW 11.3* 10.9* 11.0*  PLT 179 188 174   Thyroid No results for input(s): "TSH", "FREET4" in the last 168 hours.  BNP Recent Labs  Lab 04/11/22 1428 04/13/22 0923  BNP 552.8* 525.5*    DDimer No results for input(s): "DDIMER" in the last 168 hours.   Radiology    ECHOCARDIOGRAM COMPLETE  Result Date: 04/13/2022    ECHOCARDIOGRAM REPORT   Patient Name:   MERLEN GURRY Date of Exam: 04/13/2022 Medical Rec #:  [id number]       Height:       70.0 in Accession #:     [id number]      Weight:       196.6 lb Date of Birth:  September 02, 1955       BSA:          2.072 m Patient Age:    66 years        BP:           104/71 mmHg Patient Gender: M               HR:           120 bpm. Exam Location:  Inpatient Procedure: 2D Echo, Cardiac Doppler, Color Doppler and Intracardiac            Opacification Agent Indications:    R94.31 Abnormal EKG  History:        Patient has no prior history of Echocardiogram examinations.                 COPD, Signs/Symptoms:Shortness of Breath and Dyspnea; Risk                 Factors:Current Smoker. Lung cancer. ETOH.  Sonographer:    Roseanna Rainbow RDCS Referring Phys: [id number] [facility]  Sonographer Comments: Technically difficult study due to poor echo windows. Image acquisition challenging due to patient body habitus. IMPRESSIONS  1. Left ventricular ejection fraction, by estimation, is 55 to 60%. The left ventricle has normal function. The left ventricle has no regional wall motion abnormalities. Left ventricular diastolic function could not be evaluated.  2. Right ventricular systolic function is normal. The right ventricular size is severely enlarged. There is moderately elevated pulmonary artery systolic pressure.  3. Right atrial size was severely dilated.  4. The mitral valve is normal in structure. No evidence of mitral valve regurgitation. No evidence of mitral stenosis.  5. Tricuspid valve regurgitation is mild to moderate.  6. The aortic valve is tricuspid. Aortic valve regurgitation is not visualized. No aortic stenosis is present.  7. The inferior vena cava is dilated in size with >50% respiratory variability, suggesting right atrial pressure of 8 mmHg. FINDINGS  Left Ventricle: Left ventricular ejection fraction, by estimation, is 55 to 60%. The left ventricle has normal function. The left ventricle has no regional wall motion abnormalities. The left ventricular internal cavity size was normal in size. There is  no left ventricular hypertrophy.  Left ventricular diastolic function could not be evaluated due to atrial fibrillation. Left ventricular diastolic function could not be evaluated. Right Ventricle: The right ventricular size is severely enlarged. No increase in right ventricular wall thickness. Right ventricular systolic function is normal. There is moderately elevated pulmonary artery systolic pressure. The tricuspid regurgitant velocity is 3.47 m/s, and with an assumed right atrial pressure of 8 mmHg, the estimated right ventricular systolic pressure is 99.8 mmHg. Left Atrium: Left atrial size was normal in size. Right Atrium: Right atrial size was severely dilated. Pericardium: There is no evidence of pericardial effusion. Mitral Valve: The mitral valve is normal in structure. No evidence of mitral valve regurgitation. No evidence of mitral valve stenosis. Tricuspid Valve: The tricuspid valve is normal in structure. Tricuspid valve regurgitation is mild to moderate. No evidence of tricuspid stenosis. Aortic Valve: The aortic valve is tricuspid. Aortic valve regurgitation is not visualized. No aortic stenosis is present. Pulmonic Valve: The pulmonic valve was normal in structure. Pulmonic valve regurgitation is not visualized. No evidence of pulmonic stenosis. Aorta: The aortic root is normal in size and structure. Venous: The inferior vena cava is dilated in size with greater than 50% respiratory variability, suggesting right atrial pressure of 8 mmHg. IAS/Shunts: No atrial level shunt detected by color flow Doppler.  LEFT VENTRICLE PLAX 2D LVIDd:         4.20 cm LVIDs:         3.00 cm LV PW:         1.10 cm LV IVS:        0.96 cm LVOT diam:     2.40 cm LV SV:         53 LV SV Index:   26 LVOT Area:     4.52 cm  LV Volumes (MOD) LV vol d, MOD A4C: 58.8 ml LV vol s, MOD A4C: 18.5 ml LV SV MOD A4C:     58.8 ml RIGHT VENTRICLE             IVC RV S prime:     12.60 cm/s  IVC diam: 2.10 cm TAPSE (M-mode): 1.9 cm LEFT ATRIUM           Index         RIGHT ATRIUM           Index LA diam:      3.40 cm 1.64 cm/m   RA Area:     31.50 cm LA Vol (A2C): 29.1 ml 14.04 ml/m  RA Volume:   122.00 ml 58.87 ml/m LA Vol (A4C): 48.6 ml 23.45 ml/m  AORTIC VALVE LVOT Vmax:   106.10 cm/s LVOT Vmean:  68.350 cm/s LVOT VTI:    0.118 m  AORTA Ao Root diam: 3.70 cm Ao Asc diam:  3.30 cm MITRAL VALVE               TRICUSPID VALVE MV Area (PHT): 5.01 cm    TR Peak grad:   48.2 mmHg MV Decel Time: 152 msec    TR Vmax:        347.00 cm/s MV E  velocity: 87.55 cm/s                            SHUNTS                            Systemic VTI:  0.12 m                            Systemic Diam: 2.40 cm Sanda Klein MD Electronically signed by Sanda Klein MD Signature Date/Time: 04/13/2022/4:06:09 PM    Final     Cardiac Studies   Echo 04/13/22  1. Left ventricular ejection fraction, by estimation, is 55 to 60%. The  left ventricle has normal function. The left ventricle has no regional  wall motion abnormalities. Left ventricular diastolic function could not  be evaluated.   2. Right ventricular systolic function is normal. The right ventricular  size is severely enlarged. There is moderately elevated pulmonary artery  systolic pressure.   3. Right atrial size was severely dilated.   4. The mitral valve is normal in structure. No evidence of mitral valve  regurgitation. No evidence of mitral stenosis.   5. Tricuspid valve regurgitation is mild to moderate.   6. The aortic valve is tricuspid. Aortic valve regurgitation is not  visualized. No aortic stenosis is present.   7. The inferior vena cava is dilated in size with >50% respiratory  variability, suggesting right atrial pressure of 8 mmHg.   Patient Profile     66 y.o. male  with a hx of squamous cell lung cancer with recurrence, on chemotherapy, hypertension, COPD who is being seen 04/14/2022 for the evaluation of atrial fibrillation at the request of Dr. Dwyane Dee.   Assessment & Plan    Multifocal pneumonia  with acute hypoxic respiratory failure Bilateral pleural effusions Lung cancer s/p left pneumonectomy, now with suspected metastatic recurrence -he was still smoking prior to admission but notes now he plans to completely quit -management per primary team   Atrial fibrillation, paroxysmal -chads vasc=2 (history of hypertension, but not currently receiving treatment) -tolerating DOAC -paroxysms have decreased in frequency -now sinus with rare PVCs. Will transition to oral diltiazem. Avoiding beta blockers due to his respiratory status -discussed that pulmonary inflammation can often trigger atrial arrhythmias (SVT also seen briefly on telemetry), and given this he may have recurrent atrial fibrillation episodes in the future. Would continue DOAC as long as he tolerates, monitor CBC with chemotherapy   Severely enlarged RA/RV Enlargement of PA on CT -suggests pulmonary hypertension -he denies being told he has pulmonary hypertension in the past -no significant peripheral edema on exam, discussed what to watch for -may need PRN diuretic at discharge   Coronary calcification Aortic atherosclerosis -low priority currently in the setting of multiple medical comorbidities, above  Per patient, may be discharged tomorrow. Will make sure he tolerates oral meds today so that we can adjust tomorrow if needed.  For questions or updates, please contact [place] Please consult [email or web address] for contact info under   Signed, Buford Dresser, MD  04/15/2022, 1:34 PM

## 2022-04-15 NOTE — Progress Notes (Signed)
Pt heart rate currently in sinus rhythm with frequent PVC'S. Patient cardizem drip still running on 10mg /hr. Heart rate sustaining in low 100's. Doctor aware and ordered to continue cardizem drip for today. Pt also required more oxygen while ambulating today. Patient was increased from 2 liters nasal canula to 3 liters while ambulating. Pt pulse ox dropped to 80's while ambulating. Doctor aware.

## 2022-04-15 NOTE — Telephone Encounter (Signed)
Pharmacy Patient Advocate Encounter  Insurance verification completed.    The patient is insured through [employer] Part D   The patient is currently admitted and ran test claims for the following: Eliquis .  Copays and coinsurance results were relayed to Inpatient clinical team.      

## 2022-04-15 NOTE — TOC Benefit Eligibility Note (Addendum)
Patient Teacher, English as a foreign language completed.    The patient is currently admitted and upon discharge could be taking Eliquis 5 mg.  The current 30 day co-pay is $277.65 due to a $186.64 deductible remaining.   The patient is currently admitted and upon discharge could be taking Xarelto 20 mg.  The current 30 day co-pay is $277.65 due to a $186.64 deductible remaining.   The patient is currently admitted and upon discharge could be taking warfarin (Coumadin) 5 mg.  The current 30 day co-pay is $9.24.   The patient is insured through [place], [place] Patient Advocate Specialist [place] Patient Advocate Team Direct Number: [phone]  Fax: [phone]

## 2022-04-15 NOTE — TOC Progression Note (Signed)
Transition of Care [facility]) - Progression Note    Patient Details  Name: Benjamin Case MRN: [id number] Date of Birth: 1956/04/20  Transition of Care [facility]) CM/SW Contact  Yossi Hinchman, Juliann Pulse, RN Phone Number: 04/15/2022, 2:19 PM  Clinical Narrative:spoke to spouse about HHPT-she will get back to me. Noted on 02-will monitor if needed @ home.       Expected Discharge Plan: [place] Barriers to Discharge: Continued Medical Work up  Expected Discharge Plan and Services Expected Discharge Plan: [place]   Discharge Planning Services: CM Consult   Living arrangements for the past 2 months: Single Family Home                                       Social Determinants of Health (SDOH) Interventions    Readmission Risk Interventions     No data to display

## 2022-04-15 NOTE — Progress Notes (Signed)
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, DM2, HTN, COPD, GERD, treated hep C, peripheral neuropathy, arthritis presented in the ED with worsening shortness of breath for last 24 hours, remain hypoxic on room air SPO2 80% on arrival.  Patient continued to smoke 1 pack/day has been smoking for 50+ years.  Labs in the ED shows WBC 36.2, CT chest ruled out PE but showed new areas of groundglass and retronodular opacities throughout the right lung predominantly involving the right upper and middle lobes.  Patient is started on empiric antibiotics and admitted for further management.  Assessment & Plan:   Principal Problem:   Pneumonia Active Problems:   COPD GOLD III    Metastatic primary lung cancer, right ([place])  Multifocal pneumonia Evolving sepsis POA: He presented with shortness of breath, hypoxia, SPO2 80% on room air. Lactic acid was initially normal but later increased to 2.1. Leukocytosis 36.2 could be secondary to recent GM-CSF infusion. CT chest showed new areas of ground-glass and reticulonodular opacities throughout the right lung, predominantly involving the right upper and middle lobes.  Blood cultures negative so far. Repeat chest x-ray showed interval worsening of infiltrate in the right lower lung field. Continue IV cefepime.  Remains afebrile.  Acute hypoxic respiratory failure: Not on supplemental oxygen at home. Has been requiring 3 to 4 L O2 by nasal cannula. Briefly he has required up to 10 L of oxygen could be due to transient mucous plugging.   Noted to have wheezing.  Given a dose of Lasix 40 mg IV and Solu-Medrol 125 mg IV. He remains on 3 L of supplemental oxygen.  SPO2 93%.   Moderate  right pleural effusion: Parapneumonic effusion versus malignant effusion: Noted in CT chest on admission. he only has right lung and is at risk of significant respiratory failure with any complication.  Hold off thoracocentesis for now.  If respiratory status does not improve, may benefit from thoracentesis.    COPD: Continue incentive spirometry, Mucinex, bronchodilators Continue Solu-Medrol 40 mg daily for wheezing. He continues to smoke 1 pack/day, has been smoking for last 50 years.  Counseled to quit.  Nicotine patch offered.   Metastatic lung cancer: Patient has history of squamous cell lung cancer s/p left lobectomy (2009) with recurrence noted in Oct 2023,  He follows up with [name], has recently has chemo on 04/08/22 11/4/ 23, received an injection of GM-CSF for prevention of decreased white blood cell count. CT chest on admission showed metastasis as evidenced by multiple low-density lesions in the liver, osseous metastasis to the sternum and T3 vertebral body, enlarging mediastinal lymphadenopathy and right hilar adenopathy causing narrowing of the right mainstem bronchus. [name] notified.  If he recovers from pneumonia,  will resume his treatment in 2 weeks.   New onset A-fib with RVR: He went into new onset A-fib with RVR.   He was started on Cardizem drip and rate remains reasonably controlled. Cardiology is consulted.  Continued on full dose Lovenox for anticoagulation. He was transitioned successfully to Eliquis yesterday.  Heart rate is controlled on Cardizem gtt.   Elevated troponins: Initially troponin and BNP both elevated over 500.  No ischemic changes noted on EKG. EDP discussed with  cardiologist Dr. Johney Frame.  Formal cardiology consult requested. Elevated troponin and BNP were suspected to be due to demand ischemia in the setting of respiratory problem. Echo: LVEF 55 to 60%.  No RWMA  Type 2 diabetes : A1c 5.8 in 2020.  Diet controlled. Currently not  on meds.  Chronic EtOH use: He drinks 2-3 mixed alcoholic drinks on a daily basis. Continue CIWA protocol   Mobility Encourage ambulation.  PT and OT recommended home PT. Check oxygen requirement on ambulation.   Goals of care Code Status: Full Code.  DVT prophylaxis: Lovenox Code Status: Full code Family Communication: No family at bedside Disposition Plan:   Status is: Inpatient Remains inpatient appropriate because: Admitted for multifocal pneumonia,  acute hypoxic respiratory failure in the setting of lung cancer with metastasis. Continue IV antibiotics, now has developed new onset A-fib with RVR,  started on Cardizem gtt.  Cardiology is consulted.   Consultants:  Cardiology Oncology  Procedures: CTA Chest Antimicrobials:  Anti-infectives (From admission, onward)    Start     Dose/Rate Route Frequency Ordered Stop   04/11/22 2200  ceFEPIme (MAXIPIME) 2 g in sodium chloride 0.9 % 100 mL IVPB        2 g 200 mL/hr over 30 Minutes Intravenous Every 8 hours 04/11/22 1718     04/11/22 1515  ceFEPIme (MAXIPIME) 2 g in sodium chloride 0.9 % 100 mL IVPB        2 g 200 mL/hr over 30 Minutes Intravenous  Once 04/11/22 1502 04/11/22 1632   04/11/22 1515  vancomycin (VANCOREADY) IVPB 1500 mg/300 mL        1,500 mg 150 mL/hr over 120 Minutes Intravenous  Once 04/11/22 1502 04/11/22 1900       Subjective: Patient was seen and examined at bedside.  Overnight events noted. He was sitting comfortably on the bed, watching television. He remains on 3 L of supplemental oxygen, denies any chest pain. He asks when he can be discharged.  Objective: Vitals:   04/14/22 1635 04/14/22 1947 04/15/22 0504 04/15/22 0739  BP:  110/69 105/66   Pulse:  92 72   Resp:  17 18   Temp:  98 F (36.7 C) 97.8 F (36.6 C)   TempSrc:  Oral Oral   SpO2: 96% 99% 97% 96%  Weight:      Height:        Intake/Output Summary (Last 24 hours) at 04/15/2022 1143 Last data filed at 04/15/2022 0900 Gross  per 24 hour  Intake 120 ml  Output 575 ml  Net -455 ml   Filed Weights   04/11/22 1339 04/11/22 1813  Weight: 80.7 kg 89.2 kg    Examination:  General exam: Appears comfortable, not in any acute distress.  Deconditioned Respiratory system: CTA bilaterally, no wheezing, status post left lung resection. Cardiovascular system: S1-S2 heard, irregular rhythm, no murmur. Gastrointestinal system: Abdomen is soft, non tender, non distended, BS+ Central nervous system: Alert and oriented X 3. No focal neurological deficits. Extremities: No edema, no cyanosis, no clubbing. Skin: No rashes, lesions or ulcers Psychiatry: Judgement and insight appear normal. Mood & affect appropriate.     Data Reviewed: I have personally reviewed following labs and imaging studies  CBC: Recent Labs  Lab 04/11/22 1427 04/12/22 0308 04/13/22 0319 04/14/22 0540 04/15/22 0419  WBC 36.2* 32.6* 26.7* 20.3* 25.7*  NEUTROABS 32.3*  --  23.2* 14.4*  --   HGB 13.7 12.4* 12.5* 12.2* 10.8*  HCT 39.7 35.8* 36.0* 33.9* 30.3*  MCV 99.3 98.6 98.4 94.7 95.0  PLT 215 181 179 188 456   Basic Metabolic Panel: Recent Labs  Lab 04/11/22 1427 04/12/22 0308 04/13/22 0319 04/14/22 0540 04/15/22 0419  NA 128* 130* 128* 128* 125*  K 4.1 4.0 3.8 3.6 3.9  CL 91* 97* 95* 86* 88*  CO2 27 25 25 29 29   GLUCOSE 158* 140* 141* 168* 133*  BUN 31* 22 17 27* 40*  CREATININE 1.21 0.90 0.82 1.13 1.18  CALCIUM 8.4* 8.0* 8.3* 9.1 8.5*  MG  --   --   --  1.3* 1.8  PHOS  --   --   --  3.1 3.4   GFR: Estimated Creatinine Clearance: 67.4 mL/min (by C-G formula based on SCr of 1.18 mg/dL). Liver Function Tests: Recent Labs  Lab 04/11/22 1427  AST 44*  ALT 39  ALKPHOS 54  BILITOT 1.2  PROT 7.0  ALBUMIN 3.8   No results for input(s): "LIPASE", "AMYLASE" in the last 168 hours. No results for input(s): "AMMONIA" in the last 168 hours. Coagulation Profile: No results for input(s): "INR", "PROTIME" in the last 168  hours. Cardiac Enzymes: No results for input(s): "CKTOTAL", "CKMB", "CKMBINDEX", "TROPONINI" in the last 168 hours. BNP (last 3 results) No results for input(s): "PROBNP" in the last 8760 hours. HbA1C: No results for input(s): "HGBA1C" in the last 72 hours. CBG: Recent Labs  Lab 04/14/22 1147 04/14/22 1616 04/14/22 2107 04/15/22 0727 04/15/22 1123  GLUCAP 182* 175* 228* 177* 143*   Lipid Profile: No results for input(s): "CHOL", "HDL", "LDLCALC", "TRIG", "CHOLHDL", "LDLDIRECT" in the last 72 hours. Thyroid Function Tests: No results for input(s): "TSH", "T4TOTAL", "FREET4", "T3FREE", "THYROIDAB" in the last 72 hours. Anemia Panel: No results for input(s): "VITAMINB12", "FOLATE", "FERRITIN", "TIBC", "IRON", "RETICCTPCT" in the last 72 hours. Sepsis Labs: Recent Labs  Lab 04/11/22 1526 04/11/22 1839 04/12/22 0308 04/13/22 0319  PROCALCITON  --  0.27 0.29 0.33  LATICACIDVEN 1.2 2.1*  --  1.0    Recent Results (from the past 240 hour(s))  Culture, blood (routine x 2)     Status: None (Preliminary result)   Collection Time: 04/11/22  2:50 PM   Specimen: BLOOD  Result Value Ref Range Status   Specimen Description   Final    BLOOD RIGHT ANTECUBITAL Performed at [facility] [address], [place], [place] [address]    Special Requests   Final    BOTTLES DRAWN AEROBIC AND ANAEROBIC Blood Culture results may not be optimal due to an inadequate volume of blood received in culture bottles Performed at [facility] [address], [place], [place] [address]    Culture   Final    NO GROWTH 3 DAYS Performed at [facility], [place] [address] [place], [place] [address]    Report Status PENDING  Incomplete  Culture, blood (routine x 2)     Status: None (Preliminary result)   Collection Time: 04/11/22  2:55 PM   Specimen: BLOOD  Result Value Ref Range Status   Specimen Description   Final    BLOOD LEFT ANTECUBITAL Performed at  [facility] [address] [place], [place] [address]    Special Requests   Final    BOTTLES DRAWN AEROBIC ONLY Blood Culture results may not be optimal due to an inadequate volume of blood received in culture bottles Performed at [facility] [address], [place], [place] [address]    Culture  Final    NO GROWTH 3 DAYS Performed at [facility], [place] [address], [place], [place] [address]    Report Status PENDING  Incomplete  Resp Panel by RT-PCR (Flu A&B, Covid) Anterior Nasal Swab     Status: None   Collection Time: 04/11/22  3:26 PM   Specimen: Anterior Nasal Swab  Result Value Ref Range Status   SARS Coronavirus 2 by RT PCR NEGATIVE NEGATIVE Final    Comment: (NOTE) SARS-CoV-2 target nucleic acids are NOT DETECTED.  The SARS-CoV-2 RNA is generally detectable in upper respiratory specimens during the acute phase of infection. The lowest concentration of SARS-CoV-2 viral copies this assay can detect is 138 copies/mL. A negative result does not preclude SARS-Cov-2 infection and should not be used as the sole basis for treatment or other patient management decisions. A negative result may occur with  improper specimen collection/handling, submission of specimen other than nasopharyngeal swab, presence of viral mutation(s) within the areas targeted by this assay, and inadequate number of viral copies(<138 copies/mL). A negative result must be combined with clinical observations, patient history, and epidemiological information. The expected result is Negative.  Fact Sheet for Patients:  EntrepreneurPulse.com.au  Fact Sheet for Healthcare Providers:  IncredibleEmployment.be  This test is no t yet approved or cleared by the [place] FDA and  has been authorized for detection and/or diagnosis of SARS-CoV-2 by FDA under an Emergency Use Authorization (EUA). This EUA will remain  in effect  (meaning this test can be used) for the duration of the COVID-19 declaration under Section 564(b)(1) of the Act, 21 U.S.C.section 360bbb-3(b)(1), unless the authorization is terminated  or revoked sooner.       Influenza A by PCR NEGATIVE NEGATIVE Final   Influenza B by PCR NEGATIVE NEGATIVE Final    Comment: (NOTE) The Xpert Xpress SARS-CoV-2/FLU/RSV plus assay is intended as an aid in the diagnosis of influenza from Nasopharyngeal swab specimens and should not be used as a sole basis for treatment. Nasal washings and aspirates are unacceptable for Xpert Xpress SARS-CoV-2/FLU/RSV testing.  Fact Sheet for Patients: EntrepreneurPulse.com.au  Fact Sheet for Healthcare Providers: IncredibleEmployment.be  This test is not yet approved or cleared by the [place] FDA and has been authorized for detection and/or diagnosis of SARS-CoV-2 by FDA under an Emergency Use Authorization (EUA). This EUA will remain in effect (meaning this test can be used) for the duration of the COVID-19 declaration under Section 564(b)(1) of the Act, 21 U.S.C. section 360bbb-3(b)(1), unless the authorization is terminated or revoked.  Performed at [facility], [place] [address] [place], [place] [address]   MRSA Next Gen by PCR, Nasal     Status: None   Collection Time: 04/11/22  6:07 PM   Specimen: Nasal Mucosa; Nasal Swab  Result Value Ref Range Status   MRSA by PCR Next Gen NOT DETECTED NOT DETECTED Final    Comment: (NOTE) The GeneXpert MRSA Assay (FDA approved for NASAL specimens only), is one component of a comprehensive MRSA colonization surveillance program. It is not intended to diagnose MRSA infection nor to guide or monitor treatment for MRSA infections. Test performance is not FDA approved in patients less than 49 years old. Performed at [facility], [place] [address] [place], [place] [address]   Expectorated  Sputum Assessment w Gram Stain, Rflx to Resp Cult     Status: None   Collection Time: 04/12/22  3:15 AM   Specimen: Sputum  Result Value Ref  Range Status   Specimen Description SPUTUM  Final   Special Requests SPUTUM  Final   Sputum evaluation   Final    Sputum specimen not acceptable for testing.  Please recollect.   [place], J. AT 0714 BY 11/06/203MECIAL J. Performed at [facility], [place] [address] [place], [place] [address]    Report Status 04/12/2022 FINAL  Final    Radiology Studies: ECHOCARDIOGRAM COMPLETE  Result Date: 04/13/2022    ECHOCARDIOGRAM REPORT   Patient Name:   JARMAINE EHRLER Date of Exam: 04/13/2022 Medical Rec #:  [id number]       Height:       70.0 in Accession #:    [id number]      Weight:       196.6 lb Date of Birth:  1955/08/11       BSA:          2.072 m Patient Age:    19 years        BP:           104/71 mmHg Patient Gender: M               HR:           120 bpm. Exam Location:  Inpatient Procedure: 2D Echo, Cardiac Doppler, Color Doppler and Intracardiac            Opacification Agent Indications:    R94.31 Abnormal EKG  History:        Patient has no prior history of Echocardiogram examinations.                 COPD, Signs/Symptoms:Shortness of Breath and Dyspnea; Risk                 Factors:Current Smoker. Lung cancer. ETOH.  Sonographer:    Roseanna Rainbow RDCS Referring Phys: [id number] [facility]  Sonographer Comments: Technically difficult study due to poor echo windows. Image acquisition challenging due to patient body habitus. IMPRESSIONS  1. Left ventricular ejection fraction, by estimation, is 55 to 60%. The left ventricle has normal function. The left ventricle has no regional wall motion abnormalities. Left ventricular diastolic function could not be evaluated.  2. Right ventricular systolic function is normal. The right ventricular size is severely enlarged. There is moderately elevated pulmonary artery systolic pressure.  3. Right atrial size was  severely dilated.  4. The mitral valve is normal in structure. No evidence of mitral valve regurgitation. No evidence of mitral stenosis.  5. Tricuspid valve regurgitation is mild to moderate.  6. The aortic valve is tricuspid. Aortic valve regurgitation is not visualized. No aortic stenosis is present.  7. The inferior vena cava is dilated in size with >50% respiratory variability, suggesting right atrial pressure of 8 mmHg. FINDINGS  Left Ventricle: Left ventricular ejection fraction, by estimation, is 55 to 60%. The left ventricle has normal function. The left ventricle has no regional wall motion abnormalities. The left ventricular internal cavity size was normal in size. There is  no left ventricular hypertrophy. Left ventricular diastolic function could not be evaluated due to atrial fibrillation. Left ventricular diastolic function could not be evaluated. Right Ventricle: The right ventricular size is severely enlarged. No increase in right ventricular wall thickness. Right ventricular systolic function is normal. There is moderately elevated pulmonary artery systolic pressure. The tricuspid regurgitant velocity is 3.47 m/s, and with an assumed right atrial pressure of 8 mmHg, the estimated right ventricular systolic pressure is 18.2 mmHg. Left  Atrium: Left atrial size was normal in size. Right Atrium: Right atrial size was severely dilated. Pericardium: There is no evidence of pericardial effusion. Mitral Valve: The mitral valve is normal in structure. No evidence of mitral valve regurgitation. No evidence of mitral valve stenosis. Tricuspid Valve: The tricuspid valve is normal in structure. Tricuspid valve regurgitation is mild to moderate. No evidence of tricuspid stenosis. Aortic Valve: The aortic valve is tricuspid. Aortic valve regurgitation is not visualized. No aortic stenosis is present. Pulmonic Valve: The pulmonic valve was normal in structure. Pulmonic valve regurgitation is not visualized. No  evidence of pulmonic stenosis. Aorta: The aortic root is normal in size and structure. Venous: The inferior vena cava is dilated in size with greater than 50% respiratory variability, suggesting right atrial pressure of 8 mmHg. IAS/Shunts: No atrial level shunt detected by color flow Doppler.  LEFT VENTRICLE PLAX 2D LVIDd:         4.20 cm LVIDs:         3.00 cm LV PW:         1.10 cm LV IVS:        0.96 cm LVOT diam:     2.40 cm LV SV:         53 LV SV Index:   26 LVOT Area:     4.52 cm  LV Volumes (MOD) LV vol d, MOD A4C: 58.8 ml LV vol s, MOD A4C: 18.5 ml LV SV MOD A4C:     58.8 ml RIGHT VENTRICLE             IVC RV S prime:     12.60 cm/s  IVC diam: 2.10 cm TAPSE (M-mode): 1.9 cm LEFT ATRIUM           Index        RIGHT ATRIUM           Index LA diam:      3.40 cm 1.64 cm/m   RA Area:     31.50 cm LA Vol (A2C): 29.1 ml 14.04 ml/m  RA Volume:   122.00 ml 58.87 ml/m LA Vol (A4C): 48.6 ml 23.45 ml/m  AORTIC VALVE LVOT Vmax:   106.10 cm/s LVOT Vmean:  68.350 cm/s LVOT VTI:    0.118 m  AORTA Ao Root diam: 3.70 cm Ao Asc diam:  3.30 cm MITRAL VALVE               TRICUSPID VALVE MV Area (PHT): 5.01 cm    TR Peak grad:   48.2 mmHg MV Decel Time: 152 msec    TR Vmax:        347.00 cm/s MV E velocity: 87.55 cm/s                            SHUNTS                            Systemic VTI:  0.12 m                            Systemic Diam: 2.40 cm Mihai Croitoru MD Electronically signed by Sanda Klein MD Signature Date/Time: 04/13/2022/4:06:09 PM    Final     Scheduled Meds:  apixaban  5 mg Oral BID   folic acid  1 mg Oral Daily   guaiFENesin  600 mg Oral BID   insulin  aspart  0-5 Units Subcutaneous QHS   insulin aspart  0-9 Units Subcutaneous TID WC   ipratropium-albuterol  3 mL Nebulization QID   loratadine  10 mg Oral Daily   methylPREDNISolone (SOLU-MEDROL) injection  40 mg Intravenous Q24H   multivitamin with minerals  1 tablet Oral Daily   thiamine  100 mg Oral Daily   Or   thiamine  100 mg  Intravenous Daily   Continuous Infusions:  ceFEPime (MAXIPIME) IV 2 g (04/15/22 0514)   diltiazem (CARDIZEM) infusion 10 mg/hr (04/15/22 0633)     LOS: 4 days    Time spent: 35 mins    Oleva Koo, MD [facility]   If 7PM-7AM, please contact night-coverage

## 2022-04-15 NOTE — Progress Notes (Signed)
Mobility Specialist - Progress Note   04/15/22 1119  Oxygen Therapy  O2 Device Nasal Cannula  O2 Flow Rate (L/min) 2 L/min  Mobility  Activity Ambulated with assistance in hallway  Level of Assistance Modified independent, requires aide device or extra time  Assistive Device Front wheel walker  Distance Ambulated (ft) 80 ft  Activity Response Tolerated well  Mobility Referral Yes  $Mobility charge 1 Mobility   Pt received in bed and agreeable to mobility. Pt destat midway through walk which prompted in returning to room. Pt stated he felt fine even though O2 levels were dropping. Nurse notified. No other complaints during session. Pt to recliner after session with all needs met & respiratory therapist in room.   Pre-mobility: 94bpm HR, 91% SpO2 During mobility: 104bpm HR,  80% SpO2 Post-mobility: 98bpm HR, 93% SPO2  Set designer

## 2022-04-16 DIAGNOSIS — C3491 Malignant neoplasm of unspecified part of right bronchus or lung: Secondary | ICD-10-CM | POA: Diagnosis not present

## 2022-04-16 DIAGNOSIS — J189 Pneumonia, unspecified organism: Secondary | ICD-10-CM | POA: Diagnosis not present

## 2022-04-16 DIAGNOSIS — R0600 Dyspnea, unspecified: Secondary | ICD-10-CM | POA: Diagnosis not present

## 2022-04-16 DIAGNOSIS — R0902 Hypoxemia: Secondary | ICD-10-CM | POA: Diagnosis not present

## 2022-04-16 DIAGNOSIS — I480 Paroxysmal atrial fibrillation: Secondary | ICD-10-CM | POA: Diagnosis not present

## 2022-04-16 LAB — BASIC METABOLIC PANEL
Anion gap: 7 (ref 5–15)
BUN: 42 mg/dL — ABNORMAL HIGH (ref 8–23)
CO2: 29 mmol/L (ref 22–32)
Calcium: 8.6 mg/dL — ABNORMAL LOW (ref 8.9–10.3)
Chloride: 88 mmol/L — ABNORMAL LOW (ref 98–111)
Creatinine, Ser: 1.27 mg/dL — ABNORMAL HIGH (ref 0.61–1.24)
GFR, Estimated: >60 mL/min (ref >60)
Glucose, Bld: 117 mg/dL — ABNORMAL HIGH (ref 70–99)
Potassium: 4 mmol/L (ref 3.5–5.1)
Sodium: 124 mmol/L — ABNORMAL LOW (ref 135–145)

## 2022-04-16 LAB — CULTURE, BLOOD (ROUTINE X 2)
Culture: NO GROWTH
Culture: NO GROWTH

## 2022-04-16 LAB — CBC
HCT: 29.1 % — ABNORMAL LOW (ref 39.0–52.0)
HCT: 32.5 % — ABNORMAL LOW (ref 39.0–52.0)
Hemoglobin: 10.4 g/dL — ABNORMAL LOW (ref 13.0–17.0)
Hemoglobin: 11.4 g/dL — ABNORMAL LOW (ref 13.0–17.0)
MCH: 34.4 pg — ABNORMAL HIGH (ref 26.0–34.0)
MCH: 33.7 pg (ref 26.0–34.0)
MCHC: 35.7 g/dL (ref 30.0–36.0)
MCHC: 35.1 g/dL (ref 30.0–36.0)
MCV: 96.4 fL (ref 80.0–100.0)
MCV: 96.2 fL (ref 80.0–100.0)
Platelets: 194 10*3/uL (ref 150–400)
Platelets: 202 10*3/uL (ref 150–400)
RBC: 3.02 MIL/uL — ABNORMAL LOW (ref 4.22–5.81)
RBC: 3.38 MIL/uL — ABNORMAL LOW (ref 4.22–5.81)
RDW: 11.2 % — ABNORMAL LOW (ref 11.5–15.5)
RDW: 11.3 % — ABNORMAL LOW (ref 11.5–15.5)
WBC: 61.3 10*3/uL (ref 4.0–10.5)
WBC: 59.6 10*3/uL (ref 4.0–10.5)
nRBC: 0 % (ref 0.0–0.2)
nRBC: 0 % (ref 0.0–0.2)

## 2022-04-16 LAB — GLUCOSE, CAPILLARY
Glucose-Capillary: 116 mg/dL — ABNORMAL HIGH (ref 70–99)
Glucose-Capillary: 219 mg/dL — ABNORMAL HIGH (ref 70–99)
Glucose-Capillary: 183 mg/dL — ABNORMAL HIGH (ref 70–99)
Glucose-Capillary: 263 mg/dL — ABNORMAL HIGH (ref 70–99)

## 2022-04-16 MED ORDER — SODIUM CHLORIDE 0.9 % IV SOLN: INTRAVENOUS | Status: DC

## 2022-04-16 MED ORDER — SENNA 8.6 MG PO TABS
1.0000 | ORAL_TABLET | Freq: Every day | ORAL | Status: DC | PRN
  Administered 2022-04-16: 8.6 mg via ORAL
  Filled 2022-04-16: qty 1

## 2022-04-16 MED ORDER — SODIUM CHLORIDE 0.9 % IV BOLUS
500.0000 mL | Freq: Once | INTRAVENOUS | Status: AC
  Administered 2022-04-16: 500 mL via INTRAVENOUS

## 2022-04-16 MED ORDER — SODIUM CHLORIDE 1 G PO TABS
1.0000 g | ORAL_TABLET | Freq: Two times a day (BID) | ORAL | Status: DC
  Administered 2022-04-16 – 2022-04-17 (×3): 1 g via ORAL
  Filled 2022-04-16 (×3): qty 1

## 2022-04-16 MED ORDER — DILTIAZEM HCL ER COATED BEADS 180 MG PO CP24
360.0000 mg | ORAL_CAPSULE | Freq: Every day | ORAL | Status: DC
  Administered 2022-04-17: 360 mg via ORAL
  Filled 2022-04-16: qty 2

## 2022-04-16 NOTE — Progress Notes (Signed)
Physical Therapy Treatment Patient Details Name: Benjamin Case MRN: [id number] DOB: December 19, 1955 Today's Date: 04/16/2022   History of Present Illness Patient is 66 y.o. mal admitted on 04/11/22 for shortness of breath, hypoxia, and R lung pneumonia. It was also noted on CT that there appeared to be progression of metastatic lung cancer with lesions in the liver, sternum, vertebrae and progressive lymphadenopathy. On 04/12/22 pt noted to have new onset of Afib with RVR. PMH significant of squamous cell lung cancer with recurrence, on chemotherapy, hypertension, COPD.    PT Comments    Patient progressing well, currently requiring 3L/min for supplemental O2 to maintain sufficient SpO2. Pt instructed on safe management of portable O2 tank for ambulation, ambulated ~320' with no LOB, HR from 100-137 bpm and SpO2 >90% during gait on 3L/min. EOS reviewed flutter valve and IS exercises with 5 reps of each, pt achieving 1125 mL on IS. Continue to recommend HHPT, will progress as able.   Recommendations for follow up therapy are one component of a multi-disciplinary discharge planning process, led by the attending physician.  Recommendations may be updated based on patient status, additional functional criteria and insurance authorization.  Follow Up Recommendations  Home health PT     Assistance Recommended at Discharge Set up Supervision/Assistance  Patient can return home with the following A little help with walking and/or transfers;A little help with bathing/dressing/bathroom;Assist for transportation;Help with stairs or ramp for entrance;Assistance with cooking/housework   Equipment Recommendations  None recommended by PT    Recommendations for Other Services       Precautions / Restrictions Precautions Precautions: Fall Restrictions Weight Bearing Restrictions: No     Mobility  Bed Mobility Overal bed mobility: Needs Assistance Bed Mobility: Supine to Sit     Supine to sit:  Supervision, HOB elevated     General bed mobility comments: extra time, use of bed rail. supervision for safety.    Transfers Overall transfer level: Needs assistance Equipment used: None Transfers: Sit to/from Stand, Bed to chair/wheelchair/BSC Sit to Stand: Supervision   Step pivot transfers: Supervision       General transfer comment: supervision for safety, pt using bil UE to power up and steady turn    Ambulation/Gait Ambulation/Gait assistance: Min guard, Supervision Gait Distance (Feet): 320 Feet Assistive device:  (portable O2 tank) Gait Pattern/deviations: Step-through pattern, Decreased stride length Gait velocity: decr     General Gait Details: overall steady with no LOB noted. cues for managing portable O2 tank and pt steady. HR 100 at start of session with max of 137 bpm wtih gait. pt reports 2/4 for DOE.   Stairs             Wheelchair Mobility    Modified Rankin (Stroke Patients Only)       Balance Overall balance assessment: Mild deficits observed, not formally tested                                          Cognition Arousal/Alertness: Awake/alert Behavior During Therapy: WFL for tasks assessed/performed Overall Cognitive Status: Within Functional Limits for tasks assessed                                          Exercises      General Comments  Pertinent Vitals/Pain Pain Assessment Pain Assessment: No/denies pain    Home Living                          Prior Function            PT Goals (current goals can now be found in the care plan section) Acute Rehab PT Goals Patient Stated Goal: get home and off oxygen PT Goal Formulation: With patient/family Time For Goal Achievement: 04/28/22 Potential to Achieve Goals: Good Progress towards PT goals: Progressing toward goals    Frequency    Min 3X/week      PT Plan Current plan remains appropriate     Co-evaluation              AM-PAC PT "6 Clicks" Mobility   Outcome Measure  Help needed turning from your back to your side while in a flat bed without using bedrails?: None Help needed moving from lying on your back to sitting on the side of a flat bed without using bedrails?: None Help needed moving to and from a bed to a chair (including a wheelchair)?: A Little Help needed standing up from a chair using your arms (e.g., wheelchair or bedside chair)?: None Help needed to walk in hospital room?: A Little Help needed climbing 3-5 steps with a railing? : A Little 6 Click Score: 21    End of Session Equipment Utilized During Treatment: Gait belt;Oxygen Activity Tolerance: Patient tolerated treatment well Patient left: in chair;with call bell/phone within reach;with chair alarm set Nurse Communication: Mobility status PT Visit Diagnosis: Unsteadiness on feet (R26.81);Other abnormalities of gait and mobility (R26.89);Difficulty in walking, not elsewhere classified (R26.2)     Time: 5790-3833 PT Time Calculation (min) (ACUTE ONLY): 26 min  Charges:  $Gait Training: 23-37 mins                     Verner Mould, DPT Acute Rehabilitation Services Office [phone]  04/16/22 12:05 PM

## 2022-04-16 NOTE — Progress Notes (Signed)
SATURATION QUALIFICATIONS: (This note is used to comply with regulatory documentation for home oxygen)  Patient Saturations on Room Air at Rest = 96%  Patient Saturations on Room Air while Ambulating = 81%  Patient Saturations on 3 Liters of oxygen while Ambulating = 98%  Please briefly explain why patient needs home oxygen: patient oxygen saturation drops down below 88% on room air while ambulating.

## 2022-04-16 NOTE — TOC Transition Note (Signed)
Transition of Care [facility]) - CM/SW Discharge Note   Patient Details  Name: Benjamin Case MRN: [id number] Date of Birth: [birth year]  Transition of Care [facility]) CM/SW Contact:  Dessa Phi, RN Phone Number: 04/16/2022, 2:04 PM   Clinical Narrative: patietn/defers to spouse Benjamin Case-agree to [facility], home 02 if qualifies-[facility] for HHPT;[employer] rep Mick following for home 02 will deliver travel tank to rm d/c if needed. No further CM needs.      Final next level of care: [place] Barriers to Discharge: No Barriers Identified   Patient Goals and CMS Choice Patient states their goals for this hospitalization and ongoing recovery are:: to go home CMS Medicare.gov Compare Post Acute Care list provided to:: Patient Represenative (must comment) Choice offered to / list presented to : Spouse  Discharge Placement                       Discharge Plan and Services   Discharge Planning Services: CM Consult Post Acute Care Choice: Durable Medical Equipment, Home Health          DME Arranged: Oxygen DME Agency: [facility] Date DME Agency Contacted: 04/16/22 Time DME Agency Contacted: [id number] Representative spoke with at DME Agency: [place]: PT [place]: [facility] [place] Date [place]: 04/16/22 Time [place]: [place] Representative spoke with at [place]: [place] Determinants of Health ([place]) Interventions     Readmission Risk Interventions     No data to display

## 2022-04-16 NOTE — Progress Notes (Signed)
PROGRESS NOTE    Benjamin Case  EAV:[id number] DOB: [birth year] DOA: 04/11/2022 PCP: [name], MD   Brief Narrative:  This 66 yrs old male with PMH significant for squamous cell lung cancer s/p left lung resection in (2009) with recurrence noted in Oct 2023 currently on chemotherapy under the care of [name], DM2, HTN, COPD, GERD, treated hep C, peripheral neuropathy, arthritis presented in the ED with worsening shortness of breath for last 24 hours, remain hypoxic on room air SPO2 80% on arrival.  Patient continued to smoke 1 pack/day has been smoking for 50+ years.  Labs in the ED shows WBC 36.2, CT chest ruled out PE but showed new areas of groundglass and retronodular opacities throughout the right lung predominantly involving the right upper and middle lobes.  Patient is started on empiric antibiotics and admitted for further management.  Assessment & Plan:   Principal Problem:   Pneumonia Active Problems:   COPD GOLD III    Metastatic primary lung cancer, right (HCC)   Dyspnea   Hypoxia   Paroxysmal atrial fibrillation (HCC)  Multifocal pneumonia Evolving sepsis POA: He presented with shortness of breath, hypoxia, SPO2 80% on room air. Lactic acid was initially normal but later increased to 2.1. Leukocytosis 36.2 could be secondary to recent GM-CSF infusion. CT chest showed new areas of ground-glass and reticulonodular opacities throughout the right lung, predominantly involving the right upper and middle lobes.  Blood cultures negative so far. Repeat chest x-ray showed interval worsening of infiltrate in the right lower lung field. Continue IV cefepime for 7 days ( last day 11/11).  Remains afebrile.  Acute hypoxic respiratory failure: Not on supplemental oxygen at home. Has been requiring 3 to 4 L O2 by nasal cannula. Briefly he has required up to 10 L of oxygen could be due to transient mucous plugging.   Noted to have wheezing.  Given a dose of Lasix 40 mg IV  and Solu-Medrol 125 mg IV. He remains on 3 L of supplemental oxygen.  SPO2 93%. Home Oxygen needs to be arranged.   Moderate right pleural effusion: Parapneumonic effusion versus malignant effusion: Noted in CT chest on admission. he only has right lung and is at risk of significant respiratory failure with any complication.  Hold off thoracocentesis for now.  If respiratory status does not improve, may benefit from thoracentesis.    COPD: Continue incentive spirometry, Mucinex, bronchodilators Continue Solu-Medrol 40 mg daily for wheezing. He continues to smoke 1 pack/day, has been smoking for last 50 years.  Counseled to quit.  Nicotine patch offered.   Metastatic lung cancer: Patient has history of squamous cell lung cancer s/p left lobectomy (2009) with recurrence noted in Oct 2023,  He follows up with [name], has recently has chemo on 04/08/22 11/4/ 23, received an injection of GM-CSF for prevention of decreased white blood cell count. CT chest on admission showed metastasis as evidenced by multiple low-density lesions in the liver, osseous metastasis to the sternum and T3 vertebral body, enlarging mediastinal lymphadenopathy and right hilar adenopathy causing narrowing of the right mainstem bronchus. [name] notified.  If he recovers from pneumonia,  will resume his treatment in 2 weeks.   New onset A-fib with RVR: He went into new onset A-fib with RVR.   He was started on Cardizem drip and rate remains reasonably controlled. Cardiology is consulted.  Continued on full dose Lovenox for anticoagulation. He was transitioned successfully to Eliquis yesterday.  He is successfully transitioned  to Cardizem 360 mg p.o. daily. Heart rate is controlled.   Elevated troponins: Initially troponin and BNP both elevated over 500.  No ischemic changes noted on EKG. EDP discussed with cardiologist Dr. Johney Frame.  Formal cardiology consult requested. Elevated troponin and BNP were  suspected to be due to demand ischemia in the setting of respiratory problem. Echo: LVEF 55 to 60%.  No RWMA  Hyponatremia: Serum sodium has been running low.[id number] Start sodium tablets twice daily.  Acute kidney injury: Baseline serum creatinine normal, serum creatinine up to 1.27. Could be decreased p.o. intake.  Given NS bolus. Avoid nephrotoxic medications, recheck labs  Type 2 diabetes : A1c 5.8 in 2020.  Diet controlled. Currently not on any medications.  Chronic EtOH use: He drinks 2-3 mixed alcoholic drinks on a daily basis. Continue CIWA protocol   Mobility Encourage ambulation.  PT and OT recommended home PT. Check oxygen requirement on ambulation.   Goals of care Code Status: Full Code.  DVT prophylaxis: Lovenox Code Status: Full code Family Communication: No family at bedside Disposition Plan:   Status is: Inpatient Remains inpatient appropriate because: Admitted for multifocal pneumonia,  acute hypoxic respiratory failure in the setting of lung cancer with metastasis. Continue IV antibiotics, now has developed new onset A-fib with RVR,  started on Cardizem gtt.  Cardiology is consulted.   Consultants:  Cardiology Oncology  Procedures: CTA Chest Antimicrobials:  Anti-infectives (From admission, onward)    Start     Dose/Rate Route Frequency Ordered Stop   04/11/22 2200  ceFEPIme (MAXIPIME) 2 g in sodium chloride 0.9 % 100 mL IVPB        2 g 200 mL/hr over 30 Minutes Intravenous Every 8 hours 04/11/22 1718 04/18/22 2159   04/11/22 1515  ceFEPIme (MAXIPIME) 2 g in sodium chloride 0.9 % 100 mL IVPB        2 g 200 mL/hr over 30 Minutes Intravenous  Once 04/11/22 1502 04/11/22 1632   04/11/22 1515  vancomycin (VANCOREADY) IVPB 1500 mg/300 mL        1,500 mg 150 mL/hr over 120 Minutes Intravenous  Once 04/11/22 1502 04/11/22 1900       Subjective: Patient was seen and examined at bedside.  Overnight events noted. He was sitting comfortably on the bed  watching television. He remains on 3 L of supplemental oxygen, denies any chest pain. He is eager to be discharged.  Objective: Vitals:   04/16/22 0436 04/16/22 0726 04/16/22 1125 04/16/22 1143  BP: 113/72  126/75   Pulse: 79 84 (!) 101 (!) 103  Resp: 18 18 16 18   Temp: 97.7 F (36.5 C)  98 F (36.7 C)   TempSrc: Oral  Oral   SpO2: 98% 97% 99% 96%  Weight:      Height:        Intake/Output Summary (Last 24 hours) at 04/16/2022 1148 Last data filed at 04/16/2022 0705 Gross per 24 hour  Intake 620 ml  Output 2100 ml  Net -1480 ml   Filed Weights   04/11/22 1339 04/11/22 1813  Weight: 80.7 kg 89.2 kg    Examination:  General exam: Appears comfortable, not in any acute distress, very deconditioned. Respiratory system: CTA bilaterally, no wheezing, status post left lung resection. Cardiovascular system: S1-S2 heard, irregular rhythm, no murmur. Gastrointestinal system: Abdomen is soft, non tender, non distended, BS+ Central nervous system: Alert and oriented X 3. No focal neurological deficits. Extremities: No edema, no cyanosis, no clubbing. Skin: No rashes, lesions or  ulcers Psychiatry: Judgement and insight appear normal. Mood & affect appropriate.     Data Reviewed: I have personally reviewed following labs and imaging studies  CBC: Recent Labs  Lab 04/11/22 1427 04/12/22 0308 04/13/22 0319 04/14/22 0540 04/15/22 0419 04/16/22 0422 04/16/22 0842  WBC 36.2*   < > 26.7* 20.3* 25.7* 61.3* 59.6*  NEUTROABS 32.3*  --  23.2* 14.4*  --   --   --   HGB 13.7   < > 12.5* 12.2* 10.8* 10.4* 11.4*  HCT 39.7   < > 36.0* 33.9* 30.3* 29.1* 32.5*  MCV 99.3   < > 98.4 94.7 95.0 96.4 96.2  PLT 215   < > 179 188 174 194 202   < > = values in this interval not displayed.   Basic Metabolic Panel: Recent Labs  Lab 04/12/22 0308 04/13/22 0319 04/14/22 0540 04/15/22 0419 04/16/22 0422  NA 130* 128* 128* 125* 124*  K 4.0 3.8 3.6 3.9 4.0  CL 97* 95* 86* 88* 88*  CO2 25  25 29 29 29   GLUCOSE 140* 141* 168* 133* 117*  BUN 22 17 27* 40* 42*  CREATININE 0.90 0.82 1.13 1.18 1.27*  CALCIUM 8.0* 8.3* 9.1 8.5* 8.6*  MG  --   --  1.3* 1.8  --   PHOS  --   --  3.1 3.4  --    GFR: Estimated Creatinine Clearance: 62.6 mL/min (A) (by C-G formula based on SCr of 1.27 mg/dL (H)). Liver Function Tests: Recent Labs  Lab 04/11/22 1427  AST 44*  ALT 39  ALKPHOS 54  BILITOT 1.2  PROT 7.0  ALBUMIN 3.8   No results for input(s): "LIPASE", "AMYLASE" in the last 168 hours. No results for input(s): "AMMONIA" in the last 168 hours. Coagulation Profile: No results for input(s): "INR", "PROTIME" in the last 168 hours. Cardiac Enzymes: No results for input(s): "CKTOTAL", "CKMB", "CKMBINDEX", "TROPONINI" in the last 168 hours. BNP (last 3 results) No results for input(s): "PROBNP" in the last 8760 hours. HbA1C: No results for input(s): "HGBA1C" in the last 72 hours. CBG: Recent Labs  Lab 04/15/22 1123 04/15/22 1626 04/15/22 2003 04/16/22 0713 04/16/22 1127  GLUCAP 143* 153* 261* 116* 219*   Lipid Profile: No results for input(s): "CHOL", "HDL", "LDLCALC", "TRIG", "CHOLHDL", "LDLDIRECT" in the last 72 hours. Thyroid Function Tests: No results for input(s): "TSH", "T4TOTAL", "FREET4", "T3FREE", "THYROIDAB" in the last 72 hours. Anemia Panel: No results for input(s): "VITAMINB12", "FOLATE", "FERRITIN", "TIBC", "IRON", "RETICCTPCT" in the last 72 hours. Sepsis Labs: Recent Labs  Lab 04/11/22 1526 04/11/22 1839 04/12/22 0308 04/13/22 0319  PROCALCITON  --  0.27 0.29 0.33  LATICACIDVEN 1.2 2.1*  --  1.0    Recent Results (from the past 240 hour(s))  Culture, blood (routine x 2)     Status: None   Collection Time: 04/11/22  2:50 PM   Specimen: BLOOD  Result Value Ref Range Status   Specimen Description   Final    BLOOD RIGHT ANTECUBITAL Performed at [facility] [address], [place], [place] [address]    Special Requests   Final     BOTTLES DRAWN AEROBIC AND ANAEROBIC Blood Culture results may not be optimal due to an inadequate volume of blood received in culture bottles Performed at [facility] [address], [place], [place] [address]    Culture   Final    NO GROWTH 5 DAYS Performed at [facility], [place]  [address], [place], [place] [address]    Report Status 04/16/2022 FINAL  Final  Culture, blood (routine x 2)     Status: None   Collection Time: 04/11/22  2:55 PM   Specimen: BLOOD  Result Value Ref Range Status   Specimen Description   Final    BLOOD LEFT ANTECUBITAL Performed at [facility] [address] [place], [place] [address]    Special Requests   Final    BOTTLES DRAWN AEROBIC ONLY Blood Culture results may not be optimal due to an inadequate volume of blood received in culture bottles Performed at [facility] [address] [place], [place] [address]    Culture   Final    NO GROWTH 5 DAYS Performed at [facility], [place] [address], [place], [place] [address]    Report Status 04/16/2022 FINAL  Final  Resp Panel by RT-PCR (Flu A&B, Covid) Anterior Nasal Swab     Status: None   Collection Time: 04/11/22  3:26 PM   Specimen: Anterior Nasal Swab  Result Value Ref Range Status   SARS Coronavirus 2 by RT PCR NEGATIVE NEGATIVE Final    Comment: (NOTE) SARS-CoV-2 target nucleic acids are NOT DETECTED.  The SARS-CoV-2 RNA is generally detectable in upper respiratory specimens during the acute phase of infection. The lowest concentration of SARS-CoV-2 viral copies this assay can detect is 138 copies/mL. A negative result does not preclude SARS-Cov-2 infection and should not be used as the sole basis for treatment or other patient management decisions. A negative result may occur with  improper specimen collection/handling, submission of specimen other than nasopharyngeal swab, presence of viral mutation(s) within the areas  targeted by this assay, and inadequate number of viral copies(<138 copies/mL). A negative result must be combined with clinical observations, patient history, and epidemiological information. The expected result is Negative.  Fact Sheet for Patients:  EntrepreneurPulse.com.au  Fact Sheet for Healthcare Providers:  IncredibleEmployment.be  This test is no t yet approved or cleared by the [place] FDA and  has been authorized for detection and/or diagnosis of SARS-CoV-2 by FDA under an Emergency Use Authorization (EUA). This EUA will remain  in effect (meaning this test can be used) for the duration of the COVID-19 declaration under Section 564(b)(1) of the Act, 21 U.S.C.section 360bbb-3(b)(1), unless the authorization is terminated  or revoked sooner.       Influenza A by PCR NEGATIVE NEGATIVE Final   Influenza B by PCR NEGATIVE NEGATIVE Final    Comment: (NOTE) The Xpert Xpress SARS-CoV-2/FLU/RSV plus assay is intended as an aid in the diagnosis of influenza from Nasopharyngeal swab specimens and should not be used as a sole basis for treatment. Nasal washings and aspirates are unacceptable for Xpert Xpress SARS-CoV-2/FLU/RSV testing.  Fact Sheet for Patients: EntrepreneurPulse.com.au  Fact Sheet for Healthcare Providers: IncredibleEmployment.be  This test is not yet approved or cleared by the [place] FDA and has been authorized for detection and/or diagnosis of SARS-CoV-2 by FDA under an Emergency Use Authorization (EUA). This EUA will remain in effect (meaning this test can be used) for the duration of the COVID-19 declaration under Section 564(b)(1) of the Act, 21 U.S.C. section 360bbb-3(b)(1), unless the authorization is terminated or revoked.  Performed at [facility], [place] [address] [place], [place] [address]   MRSA Next Gen by PCR, Nasal     Status: None    Collection Time: 04/11/22  6:07 PM   Specimen: Nasal Mucosa;  Nasal Swab  Result Value Ref Range Status   MRSA by PCR Next Gen NOT DETECTED NOT DETECTED Final    Comment: (NOTE) The GeneXpert MRSA Assay (FDA approved for NASAL specimens only), is one component of a comprehensive MRSA colonization surveillance program. It is not intended to diagnose MRSA infection nor to guide or monitor treatment for MRSA infections. Test performance is not FDA approved in patients less than 46 years old. Performed at [facility], [place] [address], [place], [place] [address]   Expectorated Sputum Assessment w Gram Stain, Rflx to Resp Cult     Status: None   Collection Time: 04/12/22  3:15 AM   Specimen: Sputum  Result Value Ref Range Status   Specimen Description SPUTUM  Final   Special Requests SPUTUM  Final   Sputum evaluation   Final    Sputum specimen not acceptable for testing.  Please recollect.   [place], J. AT 0714 BY 11/06/203MECIAL J. Performed at [facility], [place] [address] [place], [place] [address]    Report Status 04/12/2022 FINAL  Final    Radiology Studies: No results found.  Scheduled Meds:  apixaban  5 mg Oral BID   [START ON 04/17/2022] diltiazem  360 mg Oral Daily   folic acid  1 mg Oral Daily   guaiFENesin  600 mg Oral BID   insulin aspart  0-5 Units Subcutaneous QHS   insulin aspart  0-9 Units Subcutaneous TID WC   ipratropium-albuterol  3 mL Nebulization QID   loratadine  10 mg Oral Daily   methylPREDNISolone (SOLU-MEDROL) injection  40 mg Intravenous Q24H   multivitamin with minerals  1 tablet Oral Daily   sodium chloride  1 g Oral BID WC   thiamine  100 mg Oral Daily   Or   thiamine  100 mg Intravenous Daily   Continuous Infusions:  ceFEPime (MAXIPIME) IV 2 g (04/16/22 0612)   sodium chloride       LOS: 5 days    Time spent: 35 mins    Wilburn Keir, MD [facility]   If 7PM-7AM, please contact  night-coverage

## 2022-04-16 NOTE — Progress Notes (Addendum)
Rounding Note    Patient Name: Benjamin Case Date of Encounter: 04/16/2022  [place] Cardiologist: Buford Dresser, MD   Subjective   No acute overnight events. Patient had episode of atrial flutter/fibrillation yesterday evening that lasted for a little over an hour. Currently in sinus rhythm with rates in the 100s to 110s. He is very eager to go home; however, he is still on 3 L of O2 via nasal cannula and was not on any at home. He initially denied feeling short of breath; however, then PT said when he went to stand up and moved to the chair he got very short of breath and patient did confirm this. He denies any chest pain.   Inpatient Medications    Scheduled Meds:  apixaban  5 mg Oral BID   diltiazem  240 mg Oral Daily   folic acid  1 mg Oral Daily   guaiFENesin  600 mg Oral BID   insulin aspart  0-5 Units Subcutaneous QHS   insulin aspart  0-9 Units Subcutaneous TID WC   ipratropium-albuterol  3 mL Nebulization QID   loratadine  10 mg Oral Daily   methylPREDNISolone (SOLU-MEDROL) injection  40 mg Intravenous Q24H   multivitamin with minerals  1 tablet Oral Daily   sodium chloride  1 g Oral BID WC   thiamine  100 mg Oral Daily   Or   thiamine  100 mg Intravenous Daily   Continuous Infusions:  ceFEPime (MAXIPIME) IV 2 g (04/16/22 0612)   sodium chloride     PRN Meds: acetaminophen **OR** acetaminophen, ALPRAZolam, guaiFENesin-dextromethorphan, hydrALAZINE, ipratropium-albuterol, ondansetron **OR** ondansetron (ZOFRAN) IV, mouth rinse, oxyCODONE-acetaminophen **AND** oxyCODONE, senna, zolpidem   Vital Signs    Vitals:   04/15/22 1323 04/15/22 2006 04/16/22 0436 04/16/22 0726  BP: 133/75 128/73 113/72   Pulse: 94 97 79 84  Resp: 20 18 18 18   Temp: 98.8 F (37.1 C) 98 F (36.7 C) 97.7 F (36.5 C)   TempSrc: Oral Oral Oral   SpO2: 95% 99% 98% 97%  Weight:      Height:        Intake/Output Summary (Last 24 hours) at 04/16/2022 0947 Last  data filed at 04/16/2022 0705 Gross per 24 hour  Intake 620 ml  Output 2100 ml  Net -1480 ml      04/11/2022    6:13 PM 04/11/2022    1:39 PM 04/01/2022    2:45 PM  Last 3 Weights  Weight (lbs) 196 lb 10.4 oz 178 lb 176 lb 6 oz  Weight (kg) 89.2 kg 80.74 kg 80.003 kg      Telemetry    Continues to go in and out of atrial fibrillation/flutter. Looks like he went into atrial flutter last evening with rates as high as 150 bpm which then transitioned to a slower atrial fibrillation and lasted for a little over 1 hour before return of sinus rhythm. Rates currently in the 100s to 110s. Frequent PACs/PVCs noted.  - Personally Reviewed  ECG    No new ECG tracing today. - Personally Reviewed  Physical Exam   GEN: No acute distress.  On 3 L of O2 via nasal cannula. Neck: No JVD. Cardiac: Borderline tachycardic with regular rhythm. No murmurs, rubs, or gallops.  Respiratory: S/p left pneumonectomy. Clear to auscultation. No significant wheezes, rhonchi, or rales noted. GI: Soft, non=distended, and non-tender.  MS: No lower extremity edema. No deformity. Skin: Warm and dry. Neuro:  No focal deficits.  Psych:  Normal affect.   Labs    High Sensitivity Troponin:   Recent Labs  Lab 04/11/22 1427 04/11/22 1839 04/12/22 0308 04/13/22 0923  TROPONINIHS 529* 594* 437* 235*     Chemistry Recent Labs  Lab 04/11/22 1427 04/12/22 0308 04/14/22 0540 04/15/22 0419 04/16/22 0422  NA 128*   < > 128* 125* 124*  K 4.1   < > 3.6 3.9 4.0  CL 91*   < > 86* 88* 88*  CO2 27   < > 29 29 29   GLUCOSE 158*   < > 168* 133* 117*  BUN 31*   < > 27* 40* 42*  CREATININE 1.21   < > 1.13 1.18 1.27*  CALCIUM 8.4*   < > 9.1 8.5* 8.6*  MG  --   --  1.3* 1.8  --   PROT 7.0  --   --   --   --   ALBUMIN 3.8  --   --   --   --   AST 44*  --   --   --   --   ALT 39  --   --   --   --   ALKPHOS 54  --   --   --   --   BILITOT 1.2  --   --   --   --   GFRNONAA >60   < > >60 >60 >60  ANIONGAP 10   < > 13  8 7    < > = values in this interval not displayed.    Lipids No results for input(s): "CHOL", "TRIG", "HDL", "LABVLDL", "LDLCALC", "CHOLHDL" in the last 168 hours.  Hematology Recent Labs  Lab 04/15/22 0419 04/16/22 0422 04/16/22 0842  WBC 25.7* 61.3* PENDING  RBC 3.19* 3.02* 3.38*  HGB 10.8* 10.4* 11.4*  HCT 30.3* 29.1* 32.5*  MCV 95.0 96.4 96.2  MCH 33.9 34.4* 33.7  MCHC 35.6 35.7 35.1  RDW 11.0* 11.2* 11.3*  PLT 174 194 202   Thyroid No results for input(s): "TSH", "FREET4" in the last 168 hours.  BNP Recent Labs  Lab 04/11/22 1428 04/13/22 0923  BNP 552.8* 525.5*    DDimer No results for input(s): "DDIMER" in the last 168 hours.   Radiology    No results found.  Cardiac Studies   Echocardiogram 04/13/2022: Impressions: 1. Left ventricular ejection fraction, by estimation, is 55 to 60%. The  left ventricle has normal function. The left ventricle has no regional  wall motion abnormalities. Left ventricular diastolic function could not  be evaluated.   2. Right ventricular systolic function is normal. The right ventricular  size is severely enlarged. There is moderately elevated pulmonary artery  systolic pressure.   3. Right atrial size was severely dilated.   4. The mitral valve is normal in structure. No evidence of mitral valve  regurgitation. No evidence of mitral stenosis.   5. Tricuspid valve regurgitation is mild to moderate.   6. The aortic valve is tricuspid. Aortic valve regurgitation is not  visualized. No aortic stenosis is present.   7. The inferior vena cava is dilated in size with >50% respiratory  variability, suggesting right atrial pressure of 8 mmHg.    Patient Profile     66 y.o. 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 CT also showed progression of  metastatic lung cancer  with lesions in the liver, sternum, vertebrae, and progressive lymphadenopathy. He went into new onset atrial fibrillation with RVR on 04/12/2022 for which Cardiology was consulted.  Assessment & Plan    New Onset Atrial Fibrillation with RVR In setting of community acquired pneumonia and metastatic lung cancer. Started on IV Diltiazem with conversion to normal sinus rhythm and now has been transitioned to PO Diltiazem. Echo showed normal LV function. - Going in and out of atrial fibrillation/flutter. Looks like he went into atrial flutter last evening with rates as high as 150 bpm which then transitioned to a slower atrial fibrillation and lasted for a little over 1 hour total before return of sinus rhythm. Currently in sinus rhythm with rates in the 90s to 110s. - Currently on Diltiazem 360mg  daily. Will increase to 320mg  daily for additional rate control. Also suspect rates will improve with continued treatment of pneumonia.  - CHA2DS-VASc = 3 (HTN, DM, and age). Continue Eliquis 5mg  twice daily.   Elevated Troponin Coronary Artery Calcifications  High-sensitivity troponin elevated but relatively flat at 529 >> 594 >> 437 >> 235.  Coronary artery calcifications noted on chest CTA. Echo showed LVEF of 55-60% with no regional wall motion abnormalities. - No chest pain. - No aspirin due to need for DOAC. - Troponin elevation secondary to demand ischemia in the the setting of pneumonia and rapid atrial fibrillation. No plans for ischemic evaluation especially in light of like metastatic lung cancer.  Severely Enlarged Right Atrium and Ventricle Echo this admission note severely enlarged RA and RV and CT showed enlargement of PA suggestive of pulmonary hypertension. Patient denies any known diagnosis of this in the past. - No evidence of volume overloaded. - Don't think any needs any diuretics at this time.  Otherwise, management per primary team:  - Acute hypoxic  respiratory failure secondary to multifocal pneumonia - Moderate right pleural effusion - Lung cancer s/p left pneumonectomy now with suspected metastatic recurrence - COPD - Hyponatremia - Leukocytosis: WBC elevated at 61,300 today. Repeat pending. - Chronic ETOH use  For questions or updates, please contact [place] Please consult [email or web address] for contact info under        Signed, Darreld Mclean, PA-C  04/16/2022, 9:47 AM

## 2022-04-17 ENCOUNTER — Inpatient Hospital Stay (HOSPITAL_COMMUNITY): Payer: Medicare Other

## 2022-04-17 DIAGNOSIS — J189 Pneumonia, unspecified organism: Secondary | ICD-10-CM | POA: Diagnosis not present

## 2022-04-17 LAB — BASIC METABOLIC PANEL
Anion gap: 6 (ref 5–15)
BUN: 40 mg/dL — ABNORMAL HIGH (ref 8–23)
CO2: 31 mmol/L (ref 22–32)
Calcium: 8.7 mg/dL — ABNORMAL LOW (ref 8.9–10.3)
Chloride: 92 mmol/L — ABNORMAL LOW (ref 98–111)
Creatinine, Ser: 1.28 mg/dL — ABNORMAL HIGH (ref 0.61–1.24)
GFR, Estimated: >60 mL/min (ref >60)
Glucose, Bld: 123 mg/dL — ABNORMAL HIGH (ref 70–99)
Potassium: 4.3 mmol/L (ref 3.5–5.1)
Sodium: 129 mmol/L — ABNORMAL LOW (ref 135–145)

## 2022-04-17 LAB — CBC
HCT: 28.3 % — ABNORMAL LOW (ref 39.0–52.0)
Hemoglobin: 9.9 g/dL — ABNORMAL LOW (ref 13.0–17.0)
MCH: 34 pg (ref 26.0–34.0)
MCHC: 35 g/dL (ref 30.0–36.0)
MCV: 97.3 fL (ref 80.0–100.0)
Platelets: 212 10*3/uL (ref 150–400)
RBC: 2.91 MIL/uL — ABNORMAL LOW (ref 4.22–5.81)
RDW: 11.4 % — ABNORMAL LOW (ref 11.5–15.5)
WBC: 89.6 10*3/uL (ref 4.0–10.5)
nRBC: 0 % (ref 0.0–0.2)

## 2022-04-17 LAB — GLUCOSE, CAPILLARY
Glucose-Capillary: 123 mg/dL — ABNORMAL HIGH (ref 70–99)
Glucose-Capillary: 233 mg/dL — ABNORMAL HIGH (ref 70–99)

## 2022-04-17 MED ORDER — FOLIC ACID 1 MG PO TABS: 1.0000 mg | ORAL_TABLET | Freq: Every day | ORAL | 1 refills | Status: AC

## 2022-04-17 MED ORDER — DILTIAZEM HCL ER COATED BEADS 360 MG PO CP24: 360.0000 mg | ORAL_CAPSULE | Freq: Every day | ORAL | 3 refills | Status: DC

## 2022-04-17 MED ORDER — VITAMIN B-1 100 MG PO TABS: 100.0000 mg | ORAL_TABLET | Freq: Every day | ORAL | 1 refills | Status: AC

## 2022-04-17 MED ORDER — APIXABAN 5 MG PO TABS: 5.0000 mg | ORAL_TABLET | Freq: Two times a day (BID) | ORAL | 3 refills | Status: DC

## 2022-04-17 NOTE — Progress Notes (Signed)
Patient discharged home.  Discharge instructions explained, patient verbalizes understanding.  Oxygen delivered to room for discharge.

## 2022-04-17 NOTE — TOC Transition Note (Signed)
Transition of Care [facility]) - CM/SW Discharge Note   Patient Details  Name: Benjamin Case MRN: [id number] Date of Birth: 1956/06/07  [facility] CM/SW Contact:  Vassie Moselle, LCSW Phone Number: 04/17/2022, 11:30 AM   Clinical Narrative:    Pt is to return home with HHPT through [facility] Contacted [employer] who are to deliver O2 to room for pt prior to discharge.    Final next level of care: [place] Barriers to Discharge: No Barriers Identified   Patient Goals and CMS Choice Patient states their goals for this hospitalization and ongoing recovery are:: to go home CMS Medicare.gov Compare Post Acute Care list provided to:: Patient Represenative (must comment) Choice offered to / list presented to : Spouse  Discharge Placement                       Discharge Plan and Services   Discharge Planning Services: CM Consult Post Acute Care Choice: Durable Medical Equipment, Home Health          DME Arranged: Oxygen DME Agency: [employer] Date DME Agency Contacted: 04/16/22 Time DME Agency Contacted: 0947 Representative spoke with at DME Agency: [place]: PT [place]: [place] Date [place]: 04/16/22 Time [place]: [place] Representative spoke with at [place]: [place] ([place]) Interventions     Readmission Risk Interventions    04/17/2022   11:29 AM  Readmission Risk Prevention Plan  Transportation Screening Complete  PCP or Specialist Appt within 3-5 Days Complete  HRI or [place] Complete  Social Work Consult for [place] Planning/Counseling Complete  Palliative Care Screening Not Applicable  Medication Review Press photographer) Complete

## 2022-04-17 NOTE — Discharge Summary (Signed)
Physician Discharge Summary  JAYVON MOUNGER JOI:[id number] DOB: 30-Aug-1955 DOA: 04/11/2022  PCP: [name], MD  Admit date: 04/11/2022  Discharge date: 04/17/2022  Admitted From: Home.  Disposition:  Home health services.  Recommendations for Outpatient Follow-up:  Follow up with PCP in 1-2 weeks. Please obtain BMP/CBC in one week Advised to follow-up with Oncologist [name] in 2 weeks. Advised to continue Cardizem 360 mg daily and Eliquis 5 mg twice daily for atrial fibrillation.  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, DM2, HTN, COPD, GERD, treated hep C, peripheral neuropathy, arthritis presented in the ED with worsening shortness of breath for last 24 hours, remain hypoxic on room air SPO2 80% on arrival.  Patient continued to smoke 1 pack/day,  has been smoking for 50+ years.  Labs in the ED shows WBC 36.2, CT chest ruled out PE but showed new areas of groundglass and retronodular opacities throughout the right lung predominantly involving the right upper and middle lobes.  Patient is started on empiric antibiotics and admitted for further management.  He was seen by oncologist [name] , recommended to continue IV antibiotics.  Patient has developed atrial fibrillation with RVR, started on Cardizem infusion.  Patient was evaluated by cardiologist and was started on Eliquis and Cardizem.  Heart rate reasonably controlled.  Echocardiogram shows LVEF 55 to 60%.  Patient feels better and wants to be discharged.  Home oxygen arranged.  Patient has increasing white cell count to 89 K this could be secondary to GM -CSF infusion.  Confirmed with Dr.  Julien Nordmann and he agreed with the discharge plan.  Discharge Diagnoses:  Principal Problem:   Pneumonia Active Problems:   COPD GOLD III    Metastatic primary lung cancer, right (HCC)   Dyspnea   Hypoxia   Paroxysmal atrial fibrillation (HCC)  Multifocal pneumonia Evolving sepsis POA: He presented with shortness of breath, hypoxia, SPO2 80% on room air. Lactic acid was initially normal but later increased to 2.1. Leukocytosis 36.2 could be secondary to recent GM-CSF infusion. CT chest showed new areas of ground-glass and reticulonodular opacities throughout the right lung, predominantly involving the right upper and middle lobes.  Blood cultures negative so far. Repeat chest x-ray showed interval worsening of infiltrate in the right lower lung field. He completed IV cefepime for 7 days ( last day 11/11).  Remains afebrile. Sepsis physiology resolved.   Acute hypoxic respiratory failure: Not on supplemental oxygen at home. Has been requiring 3 to 4 L O2 by nasal cannula. Briefly he has required up to 10 L of oxygen could be due to transient mucous plugging.   Noted to have wheezing.  Given a dose of Lasix 40 mg IV and Solu-Medrol 125 mg IV. He remains on 3 L of supplemental oxygen.  SPO2 93%. Home Oxygen has been arranged.   Moderate right pleural effusion: Parapneumonic effusion versus malignant effusion: Noted in CT chest on admission. he only has right lung and is at risk of significant respiratory failure with any complication.  Hold off thoracocentesis for now.  If respiratory status does not improve, may benefit from thoracentesis.    COPD: Continue incentive spirometry, Mucinex, bronchodilators Completed Solu-Medrol 40 mg daily for  wheezing. He continues to smoke 1 pack/day, has been smoking for last 50 years.  Counseled to quit.  Nicotine patch offered.   Metastatic lung cancer: Patient has history of squamous cell lung cancer s/p left lobectomy (2009) with recurrence  noted in Oct 2023,  He follows up with [name], has recently has chemo on 04/08/22 11/4/ 23, received an injection of GM-CSF for prevention of decreased white blood cell count. CT chest on admission showed metastasis as evidenced by multiple low-density lesions in the liver, osseous metastasis to the sternum and T3 vertebral body, enlarging mediastinal lymphadenopathy and right hilar adenopathy causing narrowing of the right mainstem bronchus. [name] notified.  If he recovers from pneumonia,  will resume his treatment in 2 weeks.   New onset A-fib with RVR: He went into new onset A-fib with RVR.   He was started on Cardizem drip and rate remains reasonably controlled. Cardiology is consulted.  Continued on full dose Lovenox for anticoagulation. He was transitioned successfully to Eliquis  during hospitalization. He is successfully transitioned to Cardizem 360 mg p.o. daily. Heart rate is controlled.   Elevated troponins: Initially troponin and BNP both elevated over 500.  No ischemic changes noted on EKG. EDP discussed with cardiologist Dr. Johney Frame.  Formal cardiology consult requested. Elevated troponin and BNP were suspected to be due to demand ischemia in the setting of respiratory problem. Echo: LVEF 55 to 60%.  No RWMA   Hyponatremia: Serum sodium has been running low.126>124> 129 Continue sodium tablets twice daily.   Acute kidney injury: > Improving Baseline serum creatinine normal, serum creatinine up to 1.27. Could be decreased p.o. intake.  Given NS bolus. Avoid nephrotoxic medications, recheck labs   Type 2 diabetes : A1c 5.8 in 2020.  Diet controlled. Currently not on any medications.   Chronic EtOH use: He drinks 2-3 mixed alcoholic drinks on a daily basis. Continue CIWA protocol   Mobility Encourage ambulation.  PT and OT recommended home PT. Check oxygen requirement on ambulation.   Goals of care Code Status: Full Code.  Discharge  Instructions  Discharge Instructions     Call MD for:  difficulty breathing, headache or visual disturbances   Complete by: As directed    Call MD for:  persistant dizziness or light-headedness   Complete by: As directed    Call MD for:  persistant nausea and vomiting   Complete by: As directed    Diet - low sodium heart healthy   Complete by: As directed    Diet Carb Modified   Complete by: As directed    Discharge instructions   Complete by: As directed    Advised to follow-up with primary care physician in 1 week. Advised to follow-up with oncologist [name] in 2 weeks. Advised to continue Cardizem 360 mg daily and Eliquis 5 mg twice daily for atrial fibrillation.   Increase activity slowly   Complete by: As directed       Allergies as of 04/17/2022   No Known Allergies      Medication List     STOP taking these medications    meloxicam 15 MG tablet Commonly known as: MOBIC       TAKE these medications    ALPRAZolam 1 MG tablet Commonly known as: XANAX Take 1 mg by mouth 3 (three) times daily as needed for anxiety.   apixaban 5 MG Tabs tablet Commonly known as: ELIQUIS Take 1 tablet (5 mg total) by mouth 2 (two) times daily.  aspirin EC 81 MG tablet Take 81 mg by mouth daily.   atorvastatin 10 MG tablet Commonly known as: LIPITOR Take 10 mg by mouth at bedtime.   BC Fast Pain Relief 845-65 MG Pack Generic drug: Aspirin-Caffeine Take 1 packet by mouth daily as needed (pain.).   [employer] Fish Oil 1000 MG Caps Take 1,000 mg by mouth every evening.   cyanocobalamin 1000 MCG tablet Commonly known as: VITAMIN B12 Take 1,000 mcg by mouth daily.   diltiazem 360 MG 24 hr capsule Commonly known as: CARDIZEM CD Take 1 capsule (360 mg total) by mouth daily. Start taking on: April 18, 2022   esomeprazole 40 MG capsule Commonly known as: NEXIUM Take 40 mg by mouth daily as needed (for heartburn).   folic acid 1 MG tablet Commonly known as:  FOLVITE Take 1 tablet (1 mg total) by mouth daily. Start taking on: April 18, 2022   guaiFENesin 600 MG 12 hr tablet Commonly known as: MUCINEX Take 600 mg by mouth 2 (two) times daily.   ipratropium-albuterol 0.5-2.5 (3) MG/3ML Soln Commonly known as: DUONEB Take 3 mLs by nebulization 2 (two) times daily.   methocarbamol 500 MG tablet Commonly known as: ROBAXIN Take 500 mg by mouth 2 (two) times daily as needed for muscle spasms.   oxyCODONE-acetaminophen 10-325 MG tablet Commonly known as: PERCOCET Take 1 tablet by mouth 5 (five) times daily as needed for pain.   pregabalin 75 MG capsule Commonly known as: LYRICA Take 75 mg by mouth 2 (two) times daily.   prochlorperazine 10 MG tablet Commonly known as: COMPAZINE Take 1 tablet (10 mg total) by mouth every 6 (six) hours as needed for nausea or vomiting.   SUPER B COMPLEX PO Take 1 tablet by mouth daily.   thiamine 100 MG tablet Commonly known as: Vitamin B-1 Take 1 tablet (100 mg total) by mouth daily. Start taking on: April 18, 2022   vitamin C 1000 MG tablet Take 1,000 mg by mouth daily.               Durable Medical Equipment  (From admission, onward)           Start     Ordered   04/16/22 1508  For home use only DME oxygen  Once       Question Answer Comment  Length of Need Lifetime   Mode or (Route) Nasal cannula   Liters per Minute 2   Frequency Continuous (stationary and portable oxygen unit needed)   Oxygen conserving device Yes   Oxygen delivery system Gas      04/16/22 1507            Follow-up Information     Loel Dubonnet, NP. Go on 04/27/2022.   Specialty: Cardiology Why: Please see Laurann Montana, NP at Dr. Judeth Cornfield office (cardiology) on 04/27/22 at 10:05 AM. Contact information: [place] [address] [place], [place] Follow up.   Specialty: [place] Why: [place] physical  therapy-contact rep [place] [phone]. Contact information: [place] [address] [place] [facility] Follow up.   Why: home oxygen Contact information: [place] [address] [phone]         [name], MD Follow up in 1 week(s).   Specialty: Family Medicine Contact information: [place]  [address] [place] [address] [phone]         Curt Bears, MD Follow up in 1 week(s).   Specialty: Oncology Contact information: [place] [address] [phone]         Darreld Mclean, PA-C Follow up in 1 week(s).   Specialties: Physician Assistant, Cardiology Contact information: [address] [place] [address] [place] [address] [phone]                No Known Allergies  Consultations: Oncology Cardiology   Procedures/Studies: DG CHEST PORT 1 VIEW  Result Date: 04/17/2022 CLINICAL DATA:  Pneumonia.  Follow-up study. EXAM: PORTABLE CHEST 1 VIEW COMPARISON:  04/13/2022.  CT, 04/11/2022. FINDINGS: Left hemithorax remains opacified with volume loss reflected by medial shift to the left. Interstitial and hazy airspace opacities in the right lung have improved. Right lung remains hyperexpanded. No new areas of right lung opacity. No right pleural effusion.  No pneumothorax. IMPRESSION: 1. Interval improvement in right lung aeration with a decrease in interstitial and hazy airspace lung opacities. This is consistent with either improved edema or improved infection. 2. Persistent opacification of the left hemithorax: Patient is status post left pneumonectomy. Electronically Signed   By: Lajean Manes M.D.   On: 04/17/2022 10:25   ECHOCARDIOGRAM COMPLETE  Result Date: 04/13/2022    ECHOCARDIOGRAM REPORT   Patient Name:   THERIN VETSCH Date of Exam: 04/13/2022 Medical Rec #:  [id number]       Height:       70.0 in Accession #:    [id number]       Weight:       196.6 lb Date of Birth:  [birth year]       BSA:          2.072 m Patient Age:    66 years        BP:           104/71 mmHg Patient Gender: M               HR:           120 bpm. Exam Location:  Inpatient Procedure: 2D Echo, Cardiac Doppler, Color Doppler and Intracardiac            Opacification Agent Indications:    R94.31 Abnormal EKG  History:        Patient has no prior history of Echocardiogram examinations.                 COPD, Signs/Symptoms:Shortness of Breath and Dyspnea; Risk                 Factors:Current Smoker. Lung cancer. ETOH.  Sonographer:    Roseanna Rainbow RDCS Referring Phys: [id number] [facility]  Sonographer Comments: Technically difficult study due to poor echo windows. Image acquisition challenging due to patient body habitus. IMPRESSIONS  1. Left ventricular ejection fraction, by estimation, is 55 to 60%. The left ventricle has normal function. The left ventricle has no regional wall motion abnormalities. Left ventricular diastolic function could not be evaluated.  2. Right ventricular systolic function is normal. The right ventricular size is severely enlarged. There is moderately elevated pulmonary artery systolic pressure.  3. Right atrial size was severely dilated.  4. The mitral valve is normal in structure. No evidence of mitral valve regurgitation. No evidence of mitral stenosis.  5. Tricuspid valve regurgitation is mild to moderate.  6. The aortic valve is tricuspid. Aortic valve  regurgitation is not visualized. No aortic stenosis is present.  7. The inferior vena cava is dilated in size with >50% respiratory variability, suggesting right atrial pressure of 8 mmHg. FINDINGS  Left Ventricle: Left ventricular ejection fraction, by estimation, is 55 to 60%. The left ventricle has normal function. The left ventricle has no regional wall motion abnormalities. The left ventricular internal cavity size was normal in size. There is  no left ventricular hypertrophy. Left  ventricular diastolic function could not be evaluated due to atrial fibrillation. Left ventricular diastolic function could not be evaluated. Right Ventricle: The right ventricular size is severely enlarged. No increase in right ventricular wall thickness. Right ventricular systolic function is normal. There is moderately elevated pulmonary artery systolic pressure. The tricuspid regurgitant velocity is 3.47 m/s, and with an assumed right atrial pressure of 8 mmHg, the estimated right ventricular systolic pressure is 99.2 mmHg. Left Atrium: Left atrial size was normal in size. Right Atrium: Right atrial size was severely dilated. Pericardium: There is no evidence of pericardial effusion. Mitral Valve: The mitral valve is normal in structure. No evidence of mitral valve regurgitation. No evidence of mitral valve stenosis. Tricuspid Valve: The tricuspid valve is normal in structure. Tricuspid valve regurgitation is mild to moderate. No evidence of tricuspid stenosis. Aortic Valve: The aortic valve is tricuspid. Aortic valve regurgitation is not visualized. No aortic stenosis is present. Pulmonic Valve: The pulmonic valve was normal in structure. Pulmonic valve regurgitation is not visualized. No evidence of pulmonic stenosis. Aorta: The aortic root is normal in size and structure. Venous: The inferior vena cava is dilated in size with greater than 50% respiratory variability, suggesting right atrial pressure of 8 mmHg. IAS/Shunts: No atrial level shunt detected by color flow Doppler.  LEFT VENTRICLE PLAX 2D LVIDd:         4.20 cm LVIDs:         3.00 cm LV PW:         1.10 cm LV IVS:        0.96 cm LVOT diam:     2.40 cm LV SV:         53 LV SV Index:   26 LVOT Area:     4.52 cm  LV Volumes (MOD) LV vol d, MOD A4C: 58.8 ml LV vol s, MOD A4C: 18.5 ml LV SV MOD A4C:     58.8 ml RIGHT VENTRICLE             IVC RV S prime:     12.60 cm/s  IVC diam: 2.10 cm TAPSE (M-mode): 1.9 cm LEFT ATRIUM           Index        RIGHT  ATRIUM           Index LA diam:      3.40 cm 1.64 cm/m   RA Area:     31.50 cm LA Vol (A2C): 29.1 ml 14.04 ml/m  RA Volume:   122.00 ml 58.87 ml/m LA Vol (A4C): 48.6 ml 23.45 ml/m  AORTIC VALVE LVOT Vmax:   106.10 cm/s LVOT Vmean:  68.350 cm/s LVOT VTI:    0.118 m  AORTA Ao Root diam: 3.70 cm Ao Asc diam:  3.30 cm MITRAL VALVE               TRICUSPID VALVE MV Area (PHT): 5.01 cm    TR Peak grad:   48.2 mmHg MV Decel Time: 152 msec    TR Vmax:  347.00 cm/s MV E velocity: 87.55 cm/s                            SHUNTS                            Systemic VTI:  0.12 m                            Systemic Diam: 2.40 cm Dani Gobble Croitoru MD Electronically signed by Sanda Klein MD Signature Date/Time: 04/13/2022/4:06:09 PM    Final    DG Chest Port 1 View  Result Date: 04/13/2022 CLINICAL DATA:  Dyspnea EXAM: PORTABLE CHEST 1 VIEW COMPARISON:  Previous studies including the examination of 04/11/2022 FINDINGS: There is opacification of left hemithorax consistent with left pneumonectomy. Increased interstitial and alveolar markings are seen in right lower lung field with interval worsening. There is improvement in the aeration of right upper lung field. Small right pleural effusion is seen. There is no pneumothorax. IMPRESSION: There is interval worsening of infiltrate in right lower lung fields suggesting worsening pneumonia. There is improvement in the aeration in right upper lung field suggesting decrease in interstitial edema or interstitial pneumonia. Small right pleural effusion. Electronically Signed   By: Elmer Picker M.D.   On: 04/13/2022 10:11   CT Angio Chest PE W and/or Wo Contrast  Result Date: 04/11/2022 CLINICAL DATA:  Pulmonary embolism (PE) suspected, unknown D-dimer Shortness of breath. Radiologic records indicates history of lung cancer, prior pneumonectomy. EXAM: CT ANGIOGRAPHY CHEST WITH CONTRAST TECHNIQUE: Multidetector CT imaging of the chest was performed using the standard  protocol during bolus administration of intravenous contrast. Multiplanar CT image reconstructions and MIPs were obtained to evaluate the vascular anatomy. RADIATION DOSE REDUCTION: This exam was performed according to the departmental dose-optimization program which includes automated exposure control, adjustment of the mA and/or kV according to patient size and/or use of iterative reconstruction technique. CONTRAST:  57mL OMNIPAQUE IOHEXOL 350 MG/ML SOLN COMPARISON:  Chest radiograph earlier today. CT 03/17/2022. PET CT 03/12/2022 FINDINGS: Cardiovascular: There are no filling defects within the pulmonary arteries to suggest pulmonary embolus. Chronic central enlargement of the main pulmonary artery. Grossly normal heart size, shifted to the left due to pneumonectomy changes. There are coronary artery calcifications. Atherosclerosis of the thoracic aorta without aneurysm or dissection. Mediastinum/Nodes: Enlarging precarinal node measuring 2.8 x 4.2 cm, series 5, image 52, previously 3.6 x 3 cm. 19 mm subcarinal node series 5, image 65, previously 18 mm. There is a 2 x 4 cm right hilar node series 5, image 66, comparison with prior is limited due to lack contrast on that exam. Low right hilar nodes measure up to 12 mm. Scattered retained debris in the esophagus. Stable volume loss in the left hemithorax post pneumonectomy with mediastinal shift. 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 Small to moderate-sized right pleural effusion is new from prior exam. There are new areas of ground-glass and reticulonodular opacities throughout the right lung, predominantly involving the right upper and middle lobes. Previous right upper lobe cavitary nodule is not well assessed on the current exam due to motion, in the region of series 11, image 44. r medial right apical nodule has increased in size 13 x 9 mm series 11, image 34, previously 10 x 7 mm. Enlarging right apical  nodule at 8 mm  series 11, image 27, previously 4 mm. There is narrowing of the right mainstem bronchus with middle and lower lobe bronchial thickening. Occasional areas of mucoid impaction in the lower lobe. Breathing motion artifact limits detailed assessment. Upper Abdomen: Low-density liver lesions in the left lobe is unchanged from recent chest CT, series 5, image 125 there are multiple additional low-density lesions in the right and left hepatic lobe. These are not definitively seen on recent CT under typical of metastatic disease. Musculoskeletal: Patient with known osseous metastatic disease. Lucency involving the posterior aspect of T3 vertebral body, series 8, image 111. Lucent lesion in the manubrium is again seen. Left lateral ninth rib fracture is new. There are multiple remote left rib fractures and postsurgical change in the left hemithorax. Review of the MIP images confirms the above findings. IMPRESSION: 1. No pulmonary embolus. 2. New areas of ground-glass and reticulonodular opacities throughout the right lung, predominantly involving the right upper and middle lobes. This is likely infectious, including atypical viral organisms. 3. Small to moderate right pleural effusion is new from prior exam. 4. Right hilar adenopathy causes narrowing of the right mainstem bronchus. 5. Enlarging mediastinal adenopathy consistent with worsening nodal disease. 6. Enlarging nodules in the right upper lobe. One of the nodules on prior chest CT is obscured on the current exam due to motion. 7. Left lateral ninth rib fracture is new. Osseous metastatic disease involving the sternum and T3 vertebral body, better demonstrated on prior PET again seen. 8. There are multiple low-density lesions in the liver that were not definitively seen on recent prior, and suspicious for metastatic disease. This is not well assessed on this phase of contrast tailored to pulmonary artery assessment. Aortic Atherosclerosis (ICD10-I70.0).  Electronically Signed   By: Keith Rake M.D.   On: 04/11/2022 16:25   DG Chest 2 View  Result Date: 04/11/2022 CLINICAL DATA:  Shortness of breath. History of LEFT pneumonectomy for LEFT lung cancer. EXAM: CHEST - 2 VIEW COMPARISON:  03/17/2022 CT, 06/09/2021 radiograph and prior studies FINDINGS: LEFT pneumonectomy changes again noted. Interstitial and airspace opacities within the RIGHT lung are now noted. A trace RIGHT pleural effusions present. There is no evidence of pneumothorax. No acute bony abnormalities are identified. IMPRESSION: Interstitial and airspace opacities within the RIGHT lung with trace RIGHT pleural effusion. This may represent pulmonary edema or infection. Electronically Signed   By: Margarette Canada M.D.   On: 04/11/2022 14:23     Subjective: Patient was seen and examined at bedside.  Overnight events noted.   Patient reports doing much better.  He wants to be discharged.   Patient is being discharged home,  Home oxygen has been arranged.  Discharge Exam: Vitals:   04/17/22 0816 04/17/22 1223  BP:  125/70  Pulse:  90  Resp:    Temp:  98.3 F (36.8 C)  SpO2: 98% 98%   Vitals:   04/16/22 2027 04/17/22 0450 04/17/22 0816 04/17/22 1223  BP: 103/77 101/64  125/70  Pulse: (!) 102 75  90  Resp: 18 14    Temp: 97.9 F (36.6 C) 97.8 F (36.6 C)  98.3 F (36.8 C)  TempSrc: Oral Oral  Oral  SpO2: 98% 100% 98% 98%  Weight:      Height:        General: Pt is alert, awake, not in acute distress Cardiovascular: RRR, S1/S2 +, no rubs, no gallops Respiratory: CTA bilaterally, no wheezing, no rhonchi Abdominal: Soft, NT, ND, bowel sounds + Extremities: no  edema, no cyanosis    The results of significant diagnostics from this hospitalization (including imaging, microbiology, ancillary and laboratory) are listed below for reference.     Microbiology: Recent Results (from the past 240 hour(s))  Culture, blood (routine x 2)     Status: None   Collection Time:  04/11/22  2:50 PM   Specimen: BLOOD  Result Value Ref Range Status   Specimen Description   Final    BLOOD RIGHT ANTECUBITAL Performed at [facility] [address] [place], [place] [address]    Special Requests   Final    BOTTLES DRAWN AEROBIC AND ANAEROBIC Blood Culture results may not be optimal due to an inadequate volume of blood received in culture bottles Performed at [facility] [address], [place], [place] [address]    Culture   Final    NO GROWTH 5 DAYS Performed at [facility], [place] [address], [place], [place] [address]    Report Status 04/16/2022 FINAL  Final  Culture, blood (routine x 2)     Status: None   Collection Time: 04/11/22  2:55 PM   Specimen: BLOOD  Result Value Ref Range Status   Specimen Description   Final    BLOOD LEFT ANTECUBITAL Performed at [facility] [address], [place], [place] [address]    Special Requests   Final    BOTTLES DRAWN AEROBIC ONLY Blood Culture results may not be optimal due to an inadequate volume of blood received in culture bottles Performed at [facility] [address], [place], [place] [address]    Culture   Final    NO GROWTH 5 DAYS Performed at [facility], [place] [address], [place], [place] [address]    Report Status 04/16/2022 FINAL  Final  Resp Panel by RT-PCR (Flu A&B, Covid) Anterior Nasal Swab     Status: None   Collection Time: 04/11/22  3:26 PM   Specimen: Anterior Nasal Swab  Result Value Ref Range Status   SARS Coronavirus 2 by RT PCR NEGATIVE NEGATIVE Final    Comment: (NOTE) SARS-CoV-2 target nucleic acids are NOT DETECTED.  The SARS-CoV-2 RNA is generally detectable in upper respiratory specimens during the acute phase of infection. The lowest concentration of SARS-CoV-2 viral copies this assay can detect is 138 copies/mL. A negative result does not preclude SARS-Cov-2 infection and should not be used  as the sole basis for treatment or other patient management decisions. A negative result may occur with  improper specimen collection/handling, submission of specimen other than nasopharyngeal swab, presence of viral mutation(s) within the areas targeted by this assay, and inadequate number of viral copies(<138 copies/mL). A negative result must be combined with clinical observations, patient history, and epidemiological information. The expected result is Negative.  Fact Sheet for Patients:  EntrepreneurPulse.com.au  Fact Sheet for Healthcare Providers:  IncredibleEmployment.be  This test is no t yet approved or cleared by the [place] FDA and  has been authorized for detection and/or diagnosis of SARS-CoV-2 by FDA under an Emergency Use Authorization (EUA). This EUA will remain  in effect (meaning this test can be used) for the duration of the COVID-19 declaration under Section 564(b)(1) of the Act, 21 U.S.C.section 360bbb-3(b)(1), unless the authorization is terminated  or revoked sooner.       Influenza A by PCR NEGATIVE NEGATIVE Final   Influenza B by PCR NEGATIVE NEGATIVE Final    Comment: (NOTE) The  Xpert Xpress SARS-CoV-2/FLU/RSV plus assay is intended as an aid in the diagnosis of influenza from Nasopharyngeal swab specimens and should not be used as a sole basis for treatment. Nasal washings and aspirates are unacceptable for Xpert Xpress SARS-CoV-2/FLU/RSV testing.  Fact Sheet for Patients: EntrepreneurPulse.com.au  Fact Sheet for Healthcare Providers: IncredibleEmployment.be  This test is not yet approved or cleared by the [place] FDA and has been authorized for detection and/or diagnosis of SARS-CoV-2 by FDA under an Emergency Use Authorization (EUA). This EUA will remain in effect (meaning this test can be used) for the duration of the COVID-19 declaration under Section 564(b)(1)  of the Act, 21 U.S.C. section 360bbb-3(b)(1), unless the authorization is terminated or revoked.  Performed at [facility], [place] [address] [place], [place] [address]   MRSA Next Gen by PCR, Nasal     Status: None   Collection Time: 04/11/22  6:07 PM   Specimen: Nasal Mucosa; Nasal Swab  Result Value Ref Range Status   MRSA by PCR Next Gen NOT DETECTED NOT DETECTED Final    Comment: (NOTE) The GeneXpert MRSA Assay (FDA approved for NASAL specimens only), is one component of a comprehensive MRSA colonization surveillance program. It is not intended to diagnose MRSA infection nor to guide or monitor treatment for MRSA infections. Test performance is not FDA approved in patients less than 73 years old. Performed at [facility], [place] [address], [place], [place] [address]   Expectorated Sputum Assessment w Gram Stain, Rflx to Resp Cult     Status: None   Collection Time: 04/12/22  3:15 AM   Specimen: Sputum  Result Value Ref Range Status   Specimen Description SPUTUM  Final   Special Requests SPUTUM  Final   Sputum evaluation   Final    Sputum specimen not acceptable for testing.  Please recollect.   [place], J. AT 0714 BY 11/06/203MECIAL J. Performed at [facility], [place] [address], [place], [place] [address]    Report Status 04/12/2022 FINAL  Final     Labs: BNP (last 3 results) Recent Labs    04/11/22 1428 04/13/22 0923  BNP 552.8* 443.1*   Basic Metabolic Panel: Recent Labs  Lab 04/13/22 0319 04/14/22 0540 04/15/22 0419 04/16/22 0422 04/17/22 0543  NA 128* 128* 125* 124* 129*  K 3.8 3.6 3.9 4.0 4.3  CL 95* 86* 88* 88* 92*  CO2 25 29 29 29 31   GLUCOSE 141* 168* 133* 117* 123*  BUN 17 27* 40* 42* 40*  CREATININE 0.82 1.13 1.18 1.27* 1.28*  CALCIUM 8.3* 9.1 8.5* 8.6* 8.7*  MG  --  1.3* 1.8  --   --   PHOS  --  3.1 3.4  --   --    Liver Function Tests: Recent Labs  Lab 04/11/22 1427  AST 44*  ALT 39   ALKPHOS 54  BILITOT 1.2  PROT 7.0  ALBUMIN 3.8   No results for input(s): "LIPASE", "AMYLASE" in the last 168 hours. No results for input(s): "AMMONIA" in the last 168 hours. CBC: Recent Labs  Lab 04/11/22 1427 04/12/22 0308 04/13/22 0319 04/14/22 0540 04/15/22 0419 04/16/22 0422 04/16/22 0842 04/17/22 0543  WBC 36.2*   < > 26.7* 20.3* 25.7* 61.3* 59.6* 89.6*  NEUTROABS 32.3*  --  23.2* 14.4*  --   --   --   --   HGB 13.7   < > 12.5* 12.2* 10.8* 10.4* 11.4* 9.9*  HCT 39.7   < >  36.0* 33.9* 30.3* 29.1* 32.5* 28.3*  MCV 99.3   < > 98.4 94.7 95.0 96.4 96.2 97.3  PLT 215   < > 179 188 174 194 202 212   < > = values in this interval not displayed.   Cardiac Enzymes: No results for input(s): "CKTOTAL", "CKMB", "CKMBINDEX", "TROPONINI" in the last 168 hours. BNP: Invalid input(s): "POCBNP" CBG: Recent Labs  Lab 04/16/22 1127 04/16/22 1626 04/16/22 2131 04/17/22 0720 04/17/22 1154  GLUCAP 219* 183* 263* 123* 233*   D-Dimer No results for input(s): "DDIMER" in the last 72 hours. Hgb A1c No results for input(s): "HGBA1C" in the last 72 hours. Lipid Profile No results for input(s): "CHOL", "HDL", "LDLCALC", "TRIG", "CHOLHDL", "LDLDIRECT" in the last 72 hours. Thyroid function studies No results for input(s): "TSH", "T4TOTAL", "T3FREE", "THYROIDAB" in the last 72 hours.  Invalid input(s): "FREET3" Anemia work up No results for input(s): "VITAMINB12", "FOLATE", "FERRITIN", "TIBC", "IRON", "RETICCTPCT" in the last 72 hours. Urinalysis    Component Value Date/Time   COLORURINE YELLOW 12/28/2007 0610   APPEARANCEUR CLEAR 12/28/2007 0610   LABSPEC 1.017 12/28/2007 0610   PHURINE 6.0 12/28/2007 0610   GLUCOSEU NEGATIVE 12/28/2007 0610   HGBUR TRACE (A) 12/28/2007 0610   BILIRUBINUR NEGATIVE 12/28/2007 0610   KETONESUR NEGATIVE 12/28/2007 0610   PROTEINUR 30 (A) 12/28/2007 0610   UROBILINOGEN 0.2 12/28/2007 0610   NITRITE NEGATIVE 12/28/2007 0610   LEUKOCYTESUR  NEGATIVE 12/28/2007 0610   Sepsis Labs Recent Labs  Lab 04/15/22 0419 04/16/22 0422 04/16/22 0842 04/17/22 0543  WBC 25.7* 61.3* 59.6* 89.6*   Microbiology Recent Results (from the past 240 hour(s))  Culture, blood (routine x 2)     Status: None   Collection Time: 04/11/22  2:50 PM   Specimen: BLOOD  Result Value Ref Range Status   Specimen Description   Final    BLOOD RIGHT ANTECUBITAL Performed at [facility], [place] [address] [place], [place] [address]    Special Requests   Final    BOTTLES DRAWN AEROBIC AND ANAEROBIC Blood Culture results may not be optimal due to an inadequate volume of blood received in culture bottles Performed at [place] [address], [place], [place] [address]    Culture   Final    NO GROWTH 5 DAYS Performed at [facility], [place] [address] [place], [place] [address]    Report Status 04/16/2022 FINAL  Final  Culture, blood (routine x 2)     Status: None   Collection Time: 04/11/22  2:55 PM   Specimen: BLOOD  Result Value Ref Range Status   Specimen Description   Final    BLOOD LEFT ANTECUBITAL Performed at [place] [address], [place], [place] [address]    Special Requests   Final    BOTTLES DRAWN AEROBIC ONLY Blood Culture results may not be optimal due to an inadequate volume of blood received in culture bottles Performed at [place] [address] [place], [place] [address]    Culture   Final    NO GROWTH 5 DAYS Performed at [facility], [place] [address] [place],  [address]    Report Status 04/16/2022 FINAL  Final  Resp Panel by RT-PCR (Flu A&B, Covid) Anterior Nasal Swab     Status: None   Collection Time: 04/11/22  3:26 PM   Specimen: Anterior Nasal Swab  Result Value Ref Range Status   SARS Coronavirus 2 by  RT PCR NEGATIVE NEGATIVE Final    Comment: (NOTE) SARS-CoV-2 target nucleic acids are NOT DETECTED.  The  SARS-CoV-2 RNA is generally detectable in upper respiratory specimens during the acute phase of infection. The lowest concentration of SARS-CoV-2 viral copies this assay can detect is 138 copies/mL. A negative result does not preclude SARS-Cov-2 infection and should not be used as the sole basis for treatment or other patient management decisions. A negative result may occur with  improper specimen collection/handling, submission of specimen other than nasopharyngeal swab, presence of viral mutation(s) within the areas targeted by this assay, and inadequate number of viral copies(<138 copies/mL). A negative result must be combined with clinical observations, patient history, and epidemiological information. The expected result is Negative.  Fact Sheet for Patients:  EntrepreneurPulse.com.au  Fact Sheet for Healthcare Providers:  IncredibleEmployment.be  This test is no t yet approved or cleared by the [place] FDA and  has been authorized for detection and/or diagnosis of SARS-CoV-2 by FDA under an Emergency Use Authorization (EUA). This EUA will remain  in effect (meaning this test can be used) for the duration of the COVID-19 declaration under Section 564(b)(1) of the Act, 21 U.S.C.section 360bbb-3(b)(1), unless the authorization is terminated  or revoked sooner.       Influenza A by PCR NEGATIVE NEGATIVE Final   Influenza B by PCR NEGATIVE NEGATIVE Final    Comment: (NOTE) The Xpert Xpress SARS-CoV-2/FLU/RSV plus assay is intended as an aid in the diagnosis of influenza from Nasopharyngeal swab specimens and should not be used as a sole basis for treatment. Nasal washings and aspirates are unacceptable for Xpert Xpress SARS-CoV-2/FLU/RSV testing.  Fact Sheet for Patients: EntrepreneurPulse.com.au  Fact Sheet for Healthcare Providers: IncredibleEmployment.be  This test is not yet approved or  cleared by the [place] FDA and has been authorized for detection and/or diagnosis of SARS-CoV-2 by FDA under an Emergency Use Authorization (EUA). This EUA will remain in effect (meaning this test can be used) for the duration of the COVID-19 declaration under Section 564(b)(1) of the Act, 21 U.S.C. section 360bbb-3(b)(1), unless the authorization is terminated or revoked.  Performed at [facility], [place] [address] [place], [place] [address]   MRSA Next Gen by PCR, Nasal     Status: None   Collection Time: 04/11/22  6:07 PM   Specimen: Nasal Mucosa; Nasal Swab  Result Value Ref Range Status   MRSA by PCR Next Gen NOT DETECTED NOT DETECTED Final    Comment: (NOTE) The GeneXpert MRSA Assay (FDA approved for NASAL specimens only), is one component of a comprehensive MRSA colonization surveillance program. It is not intended to diagnose MRSA infection nor to guide or monitor treatment for MRSA infections. Test performance is not FDA approved in patients less than 8 years old. Performed at [facility], [place] [address] [place], [place] [address]   Expectorated Sputum Assessment w Gram Stain, Rflx to Resp Cult     Status: None   Collection Time: 04/12/22  3:15 AM   Specimen: Sputum  Result Value Ref Range Status   Specimen Description SPUTUM  Final   Special Requests SPUTUM  Final   Sputum evaluation   Final    Sputum specimen not acceptable for testing.  Please recollect.   [place], J. AT 0714 BY 11/06/203MECIAL J. Performed at [facility], [place] [address], [place],  [address]    Report Status 04/12/2022 FINAL  Final     Time coordinating  discharge: Over 30 minutes  SIGNED:   Shawna Clamp, MD  [facility] 04/17/2022, 1:31 PM Pager   If 7PM-7AM, please contact night-coverage

## 2022-04-19 ENCOUNTER — Telehealth: Payer: Self-pay | Admitting: Physician Assistant

## 2022-04-19 NOTE — Telephone Encounter (Signed)
Pt c/o medication issue:  1. Name of Medication:  apixaban (ELIQUIS) 5 MG TABS tablet  2. How are you currently taking this medication (dosage and times per day)?   3. Are you having a reaction (difficulty breathing--STAT)?   4. What is your medication issue?   Patient's spouse is following up regarding eye issues. She states he has still been having issues with both eyes--mainly the left. They did not go to the ED because the patient just left the ED on 11/11. However, she states she assumes it may be due to taking Eliquis. She states the patient has withheld Eliquis today.

## 2022-04-19 NOTE — Telephone Encounter (Signed)
Paged by answering service.  Patient woke up at 6 AM this morning and since then unable to see from left eye.  No slurred speech or weakness on one side.  No facial droop.  I have recommended to go to ER or see eye doctor ASAP for further evaluation.

## 2022-04-19 NOTE — Telephone Encounter (Signed)
Spoke with wife regarding patient He woke up this morning around 6 am and was unable to see out of his left eye, right eye he could see but not very well This has gotten better throughout the day however not completely resolved Blood pressure this am 108/55 HR 71 this am so she did not give him his Diltiazem  Discussed with Dr Harrell Gave who also suggested patient go to ED for evaluation, needs CT of head to rule out bleed/stroke  Advised wife, verbalized understanding

## 2022-04-21 ENCOUNTER — Telehealth: Payer: Self-pay | Admitting: Medical Oncology

## 2022-04-21 ENCOUNTER — Telehealth: Payer: Self-pay | Admitting: Cardiology

## 2022-04-21 ENCOUNTER — Other Ambulatory Visit: Payer: Self-pay | Admitting: Medical Oncology

## 2022-04-21 ENCOUNTER — Telehealth: Payer: Self-pay | Admitting: Internal Medicine

## 2022-04-21 NOTE — Telephone Encounter (Signed)
Pt c/o BP issue: STAT if pt c/o blurred vision, one-sided weakness or slurred speech  1. What are your last 5 BP readings?  107/60 HR 95 96/63 HR 98 93/55 HR 52 99/70 HR 112  90/59 HR 99 - Taken today at 12:40 P.M.   2. Are you having any other symptoms (ex. Dizziness, headache, blurred vision, passed out)?  Headache   3. What is your BP issue? Pt is having issues with low BP and patient's wife is requesting call back to go over medication.

## 2022-04-21 NOTE — Telephone Encounter (Signed)
Wife reporting pt not feeling any better since discharge from hospital.   She reiterated the information she called to his cardiology a few days ago about  his eyes. I reviewed the signs and symptoms  of stroke and reinforced that she was told to take him to ED .    She proceeded to ask me if he should be on antibiotics.  I told her I cannot answer that and informed her that he received treatment in the hospital and read her the latest cxr.  I also told her to f/u with Dr Stephanie Acre as scheduled tomorrow to have blood tests rechecked.  I again reiterated the instructions given to her by cardiologist staff. She voiced understanding.

## 2022-04-21 NOTE — Telephone Encounter (Signed)
Called patient regarding 11/24 rescheduled appointment, patient is notified.

## 2022-04-21 NOTE — Telephone Encounter (Signed)
Took the Diltiazem on 11/12, that was the last dose  Blood pressure has been low and getting lower since  Had bowel movement yesterday that was black and today's was black and tarry looking  Discussed with Dr Harrell Gave and she strongly recommends going to the emergency room   Advised wife, verbalized understanding  Patient does not want to go to ED, she will try to encourage him

## 2022-04-22 ENCOUNTER — Telehealth (HOSPITAL_BASED_OUTPATIENT_CLINIC_OR_DEPARTMENT_OTHER): Payer: Self-pay | Admitting: Cardiology

## 2022-04-22 NOTE — Telephone Encounter (Signed)
Caller stated they saw the patient today and wanted to report the patient is in sinus tachycardia on EKG and exam.  Caller stated the patient also had black stools for 2 days and heme positive.  Caller reported patient's hemoglobin was 9.9, BP today was 109/79 and HR 108.  Caller noted the patient's pneumonia is better and the doctor decreased the patient's diltiazem (CARDIZEM CD) 360 MG 24 hr capsule from 360 mg down to 60 mg twice daily.  Doctor also put a hold on the apixaban (ELIQUIS) 5 MG TABS tablet for now.

## 2022-04-22 NOTE — Telephone Encounter (Signed)
FYI

## 2022-04-23 ENCOUNTER — Other Ambulatory Visit: Payer: Self-pay | Admitting: Physician Assistant

## 2022-04-23 DIAGNOSIS — R195 Other fecal abnormalities: Secondary | ICD-10-CM

## 2022-04-23 NOTE — Progress Notes (Unsigned)
[facility] OFFICE PROGRESS NOTE  Benjamin Jordan, MD [address] [place] [address]  DIAGNOSIS: stage IV (T1a, N2, M1 C) non-small cell lung cancer, squamous cell carcinoma presented with right upper lobe lung nodule in addition to right hilar and mediastinal lymphadenopathy and metastatic disease to several bone areas as well as musculoskeletal muscles diagnosed in October 2023. The patient also has a history of a stage IIIa non-small cell lung cancer, squamous cell carcinoma diagnosed in June 2009   Detected Alteration(s) / Biomarker(s)         Associated FDA-approved therapies Clinical Trial Availability          % cfDNA or Amplification KRAS G12C approved by FDA Adagrasib, Sotorasib Yes    6.8%   TP53 S241C None Yes          0.9%   TP53 Q136E None Yes          0.5%  PLDL1: ***  PRIOR THERAPY: 1) status post left pneumonectomy in 2009 2) 4 cycles of adjuvant chemotherapy completed in April 29, 2008   CURRENT THERAPY: Held to systemic chemotherapy with carboplatin for an AUC of 5, paclitaxel 175 mg per metered square, and converted to 100 mg IV every 3 weeks with Neulasta support.  Status post 1 cycle.  First dose of treatment on 04/08/22  INTERVAL HISTORY: Benjamin Case 66 y.o. male returns to clinic today for follow-up visit accompanied by ***.  Fortunately, the patient was recently diagnosed with metastatic lung cancer.  He underwent cycle #1 on 11/2.  He tolerated this**.  He presented to the emergency room on 11/5 for the chief complaint of worsening shortness of breath.  His oxygen was 80%.  Imaging revealed new areas of groundglass and retronodular opacities throughout the right lung, predominantly involving the right upper lobe and right middle lobe.  He was started on empiric antibiotics.  His hospitalization was complicated by developing atrial fibrillation with RVR.  His cardiologist?  He was discharged on 11/11/thousand 23 on  supplemental oxygen with ***liters.  A few days following his discharge, the patient presented with not being able to see out of his left? Eye. ***Chart review, it looks like cardiology recommending emergency room evaluation to rule out stroke.  Is being discharged, the patient is feeling ***. Complaining of dark stools?  Currently on a blood thinner. he denies any fever, chills, or night sweats.  Breathing?  Cough?  He denies any hemoptysis or chest pain.  Struggling with hypotension?  He denies any nausea, vomiting, diarrhea, or constipation.  Denies any headache.  Denies any rashes or skin changes.  He is here today for evaluation and repeat blood work before considering starting cycle #2.      MEDICAL HISTORY: Past Medical History:  Diagnosis Date   Arthritis    back   COPD (chronic obstructive pulmonary disease) ([facility])    Diabetes mellitus without complication (HCC)    no medications , monitors cbg at home with monitor, avg no more than 150s at home    Dyspnea    increased exertion; pt states can climb flight of stairs w/o having to stop prior to reaching top ; ongoing reports it is due to "having one lung"    GERD (gastroesophageal reflux disease)    Hepatitis    hepatitis C; pt states competed treatments, unsure what medication he was treated with    History of chemotherapy    Hypertension    reports its  been over a year since he stopped taking bp medication, reports he made her medical doctor aware that he was topping it    lung ca dx'd 12/2007   lt lobectomy   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   and back - see note in [facility] 12/26/21   Numbness    FEET BILATERAL   Pneumonia 2009   Right upper lobe pulmonary nodule 04/29/2021   Rotator cuff tear    right     ALLERGIES:  has No Known Allergies.  MEDICATIONS:  Current Outpatient Medications  Medication Sig Dispense Refill   ALPRAZolam (XANAX) 1 MG tablet Take 1 mg by mouth 3 (three) times daily as needed for anxiety.  5    apixaban (ELIQUIS) 5 MG TABS tablet Take 1 tablet (5 mg total) by mouth 2 (two) times daily. 60 tablet 3   Ascorbic Acid (VITAMIN C) 1000 MG tablet Take 1,000 mg by mouth daily.     aspirin EC 81 MG tablet Take 81 mg by mouth daily.     Aspirin-Caffeine (BC FAST PAIN RELIEF) 845-65 MG PACK Take 1 packet by mouth daily as needed (pain.).     atorvastatin (LIPITOR) 10 MG tablet Take 10 mg by mouth at bedtime.      B Complex-C (SUPER B COMPLEX PO) Take 1 tablet by mouth daily.     diltiazem (CARDIZEM CD) 360 MG 24 hr capsule Take 1 capsule (360 mg total) by mouth daily. 30 capsule 3   esomeprazole (NEXIUM) 40 MG capsule Take 40 mg by mouth daily as needed (for heartburn).      folic acid (FOLVITE) 1 MG tablet Take 1 tablet (1 mg total) by mouth daily. 30 tablet 1   guaiFENesin (MUCINEX) 600 MG 12 hr tablet Take 600 mg by mouth 2 (two) times daily.     ipratropium-albuterol (DUONEB) 0.5-2.5 (3) MG/3ML SOLN Take 3 mLs by nebulization 2 (two) times daily.     methocarbamol (ROBAXIN) 500 MG tablet Take 500 mg by mouth 2 (two) times daily as needed for muscle spasms.     Omega-3 Fatty Acids ([employer] FISH OIL) 1000 MG CAPS Take 1,000 mg by mouth every evening.     oxyCODONE-acetaminophen (PERCOCET) 10-325 MG tablet Take 1 tablet by mouth 5 (five) times daily as needed for pain.     pregabalin (LYRICA) 75 MG capsule Take 75 mg by mouth 2 (two) times daily.     prochlorperazine (COMPAZINE) 10 MG tablet Take 1 tablet (10 mg total) by mouth every 6 (six) hours as needed for nausea or vomiting. 30 tablet 0   thiamine (VITAMIN B-1) 100 MG tablet Take 1 tablet (100 mg total) by mouth daily. 30 tablet 1   thiamine (VITAMIN B1) 100 MG tablet Take 100 mg by mouth daily.     vitamin B-12 (CYANOCOBALAMIN) 1000 MCG tablet Take 1,000 mcg by mouth daily.     No current facility-administered medications for this visit.    SURGICAL HISTORY:  Past Surgical History:  Procedure Laterality Date   ARTHROSCOPIC REPAIR ACL  Right    BRONCHIAL BIOPSY  06/09/2021   Procedure: BRONCHIAL BIOPSIES;  Surgeon: Garner Nash, DO;  Location: [facility];  Service: Pulmonary;;   BRONCHIAL BRUSHINGS  06/09/2021   Procedure: BRONCHIAL BRUSHINGS;  Surgeon: Garner Nash, DO;  Location: [facility];  Service: Pulmonary;;   BRONCHIAL WASHINGS  06/09/2021   Procedure: BRONCHIAL WASHINGS;  Surgeon: Garner Nash, DO;  Location: [facility];  Service: Pulmonary;;  CATARACT EXTRACTION, BILATERAL  2019   DECOMPRESSIVE LUMBAR LAMINECTOMY LEVEL 1 N/A 03/24/2016   Procedure: L3-4 CENTRAL DECOMPRESSION LUMBER LAMINECTOMY;  Surgeon: Latanya Maudlin, MD;  Location: [facility];  Service: Orthopedics;  Laterality: N/A;   FINE NEEDLE ASPIRATION  03/23/2022   Procedure: FINE NEEDLE ASPIRATION (FNA) LINEAR;  Surgeon: Garner Nash, DO;  Location: [facility];  Service: Pulmonary;;   FOOT SURGERY Bilateral    HAND SURGERY Bilateral    LOBECTOMY Left 2009   pneumonectomy Left 2009   [name]   SHOULDER ARTHROSCOPY WITH DISTAL CLAVICLE RESECTION Right 12/14/2018   Procedure: SHOULDER ARTHROSCOPY WITH DISTAL CLAVICLE RESECTION;  Surgeon: Tania Ade, MD;  Location: [facility];  Service: Orthopedics;  Laterality: Right;   SHOULDER ARTHROSCOPY WITH ROTATOR CUFF REPAIR Right 12/14/2018   Procedure: SHOULDER ARTHROSCOPY WITH ROTATOR CUFF REPAIR;  Surgeon: Tania Ade, MD;  Location: [facility];  Service: Orthopedics;  Laterality: Right;   SHOULDER ARTHROSCOPY WITH SUBACROMIAL DECOMPRESSION Right 12/14/2018   Procedure: SHOULDER ARTHROSCOPY WITH SUBACROMIAL DECOMPRESSION;  Surgeon: Tania Ade, MD;  Location: [facility];  Service: Orthopedics;  Laterality: Right;   TRIGGER FINGER RELEASE     right hand, pointer finger    VIDEO BRONCHOSCOPY WITH ENDOBRONCHIAL ULTRASOUND Right 03/23/2022   Procedure: VIDEO BRONCHOSCOPY WITH ENDOBRONCHIAL ULTRASOUND;  Surgeon: Garner Nash, DO;  Location: [facility];  Service: Pulmonary;   Laterality: Right;   VIDEO BRONCHOSCOPY WITH RADIAL ENDOBRONCHIAL ULTRASOUND  06/09/2021   Procedure: VIDEO BRONCHOSCOPY WITH RADIAL ENDOBRONCHIAL ULTRASOUND;  Surgeon: Garner Nash, DO;  Location: [facility];  Service: Pulmonary;;    REVIEW OF SYSTEMS:   Review of Systems  Constitutional: Negative for appetite change, chills, fatigue, fever and unexpected weight change.  HENT:   Negative for mouth sores, nosebleeds, sore throat and trouble swallowing.   Eyes: Negative for eye problems and icterus.  Respiratory: Negative for cough, hemoptysis, shortness of breath and wheezing.   Cardiovascular: Negative for chest pain and leg swelling.  Gastrointestinal: Negative for abdominal pain, constipation, diarrhea, nausea and vomiting.  Genitourinary: Negative for bladder incontinence, difficulty urinating, dysuria, frequency and hematuria.   Musculoskeletal: Negative for back pain, gait problem, neck pain and neck stiffness.  Skin: Negative for itching and rash.  Neurological: Negative for dizziness, extremity weakness, gait problem, headaches, light-headedness and seizures.  Hematological: Negative for adenopathy. Does not bruise/bleed easily.  Psychiatric/Behavioral: Negative for confusion, depression and sleep disturbance. The patient is not nervous/anxious.     PHYSICAL EXAMINATION:  There were no vitals taken for this visit.  ECOG PERFORMANCE STATUS: {CHL ONC ECOG [id number]  Physical Exam  Constitutional: Oriented to person, place, and time and well-developed, well-nourished, and in no distress. No distress.  HENT:  Head: Normocephalic and atraumatic.  Mouth/Throat: Oropharynx is clear and moist. No oropharyngeal exudate.  Eyes: Conjunctivae are normal. Right eye exhibits no discharge. Left eye exhibits no discharge. No scleral icterus.  Neck: Normal range of motion. Neck supple.  Cardiovascular: Normal rate, regular rhythm, normal heart sounds and intact distal pulses.    Pulmonary/Chest: Effort normal and breath sounds normal. No respiratory distress. No wheezes. No rales.  Abdominal: Soft. Bowel sounds are normal. Exhibits no distension and no mass. There is no tenderness.  Musculoskeletal: Normal range of motion. Exhibits no edema.  Lymphadenopathy:    No cervical adenopathy.  Neurological: Alert and oriented to person, place, and time. Exhibits normal muscle tone. Gait normal. Coordination normal.  Skin: Skin is warm and dry. No rash noted. Not diaphoretic. No  erythema. No pallor.  Psychiatric: Mood, memory and judgment normal.  Vitals reviewed.  LABORATORY DATA: Lab Results  Component Value Date   WBC 89.6 (HH) 04/17/2022   HGB 9.9 (L) 04/17/2022   HCT 28.3 (L) 04/17/2022   MCV 97.3 04/17/2022   PLT 212 04/17/2022      Chemistry      Component Value Date/Time   NA 129 (L) 04/17/2022 0543   NA 138 04/26/2016 1205   K 4.3 04/17/2022 0543   K 5.0 04/26/2016 1205   CL 92 (L) 04/17/2022 0543   CL 103 04/27/2012 1253   CO2 31 04/17/2022 0543   CO2 30 (H) 04/26/2016 1205   BUN 40 (H) 04/17/2022 0543   BUN 6.0 (L) 04/26/2016 1205   CREATININE 1.28 (H) 04/17/2022 0543   CREATININE 1.00 04/08/2022 1025   CREATININE 1.3 04/26/2016 1205      Component Value Date/Time   CALCIUM 8.7 (L) 04/17/2022 0543   CALCIUM 9.8 04/26/2016 1205   ALKPHOS 54 04/11/2022 1427   ALKPHOS 60 04/26/2016 1205   AST 44 (H) 04/11/2022 1427   AST 28 04/08/2022 1025   AST 40 (H) 04/26/2016 1205   ALT 39 04/11/2022 1427   ALT 26 04/08/2022 1025   ALT 25 04/26/2016 1205   BILITOT 1.2 04/11/2022 1427   BILITOT 0.9 04/08/2022 1025   BILITOT 0.47 04/26/2016 1205       RADIOGRAPHIC STUDIES:  DG CHEST PORT 1 VIEW  Result Date: 04/17/2022 CLINICAL DATA:  Pneumonia.  Follow-up study. EXAM: PORTABLE CHEST 1 VIEW COMPARISON:  04/13/2022.  CT, 04/11/2022. FINDINGS: Left hemithorax remains opacified with volume loss reflected by medial shift to the left. Interstitial  and hazy airspace opacities in the right lung have improved. Right lung remains hyperexpanded. No new areas of right lung opacity. No right pleural effusion.  No pneumothorax. IMPRESSION: 1. Interval improvement in right lung aeration with a decrease in interstitial and hazy airspace lung opacities. This is consistent with either improved edema or improved infection. 2. Persistent opacification of the left hemithorax: Patient is status post left pneumonectomy. Electronically Signed   By: Lajean Manes M.D.   On: 04/17/2022 10:25   ECHOCARDIOGRAM COMPLETE  Result Date: 04/13/2022    ECHOCARDIOGRAM REPORT   Patient Name:   Benjamin Case Date of Exam: 04/13/2022 Medical Rec #:  [id number]       Height:       70.0 in Accession #:    [id number]      Weight:       196.6 lb Date of Birth:  06-Mar-1956       BSA:          2.072 m Patient Age:    66 years        BP:           104/71 mmHg Patient Gender: M               HR:           120 bpm. Exam Location:  Inpatient Procedure: 2D Echo, Cardiac Doppler, Color Doppler and Intracardiac            Opacification Agent Indications:    R94.31 Abnormal EKG  History:        Patient has no prior history of Echocardiogram examinations.                 COPD, Signs/Symptoms:Shortness of Breath and Dyspnea; Risk  Factors:Current Smoker. Lung cancer. ETOH.  Sonographer:    Roseanna Rainbow RDCS Referring Phys: [id number] [facility]  Sonographer Comments: Technically difficult study due to poor echo windows. Image acquisition challenging due to patient body habitus. IMPRESSIONS  1. Left ventricular ejection fraction, by estimation, is 55 to 60%. The left ventricle has normal function. The left ventricle has no regional wall motion abnormalities. Left ventricular diastolic function could not be evaluated.  2. Right ventricular systolic function is normal. The right ventricular size is severely enlarged. There is moderately elevated pulmonary artery systolic pressure.  3. Right  atrial size was severely dilated.  4. The mitral valve is normal in structure. No evidence of mitral valve regurgitation. No evidence of mitral stenosis.  5. Tricuspid valve regurgitation is mild to moderate.  6. The aortic valve is tricuspid. Aortic valve regurgitation is not visualized. No aortic stenosis is present.  7. The inferior vena cava is dilated in size with >50% respiratory variability, suggesting right atrial pressure of 8 mmHg. FINDINGS  Left Ventricle: Left ventricular ejection fraction, by estimation, is 55 to 60%. The left ventricle has normal function. The left ventricle has no regional wall motion abnormalities. The left ventricular internal cavity size was normal in size. There is  no left ventricular hypertrophy. Left ventricular diastolic function could not be evaluated due to atrial fibrillation. Left ventricular diastolic function could not be evaluated. Right Ventricle: The right ventricular size is severely enlarged. No increase in right ventricular wall thickness. Right ventricular systolic function is normal. There is moderately elevated pulmonary artery systolic pressure. The tricuspid regurgitant velocity is 3.47 m/s, and with an assumed right atrial pressure of 8 mmHg, the estimated right ventricular systolic pressure is 01.0 mmHg. Left Atrium: Left atrial size was normal in size. Right Atrium: Right atrial size was severely dilated. Pericardium: There is no evidence of pericardial effusion. Mitral Valve: The mitral valve is normal in structure. No evidence of mitral valve regurgitation. No evidence of mitral valve stenosis. Tricuspid Valve: The tricuspid valve is normal in structure. Tricuspid valve regurgitation is mild to moderate. No evidence of tricuspid stenosis. Aortic Valve: The aortic valve is tricuspid. Aortic valve regurgitation is not visualized. No aortic stenosis is present. Pulmonic Valve: The pulmonic valve was normal in structure. Pulmonic valve regurgitation is not  visualized. No evidence of pulmonic stenosis. Aorta: The aortic root is normal in size and structure. Venous: The inferior vena cava is dilated in size with greater than 50% respiratory variability, suggesting right atrial pressure of 8 mmHg. IAS/Shunts: No atrial level shunt detected by color flow Doppler.  LEFT VENTRICLE PLAX 2D LVIDd:         4.20 cm LVIDs:         3.00 cm LV PW:         1.10 cm LV IVS:        0.96 cm LVOT diam:     2.40 cm LV SV:         53 LV SV Index:   26 LVOT Area:     4.52 cm  LV Volumes (MOD) LV vol d, MOD A4C: 58.8 ml LV vol s, MOD A4C: 18.5 ml LV SV MOD A4C:     58.8 ml RIGHT VENTRICLE             IVC RV S prime:     12.60 cm/s  IVC diam: 2.10 cm TAPSE (M-mode): 1.9 cm LEFT ATRIUM           Index  RIGHT ATRIUM           Index LA diam:      3.40 cm 1.64 cm/m   RA Area:     31.50 cm LA Vol (A2C): 29.1 ml 14.04 ml/m  RA Volume:   122.00 ml 58.87 ml/m LA Vol (A4C): 48.6 ml 23.45 ml/m  AORTIC VALVE LVOT Vmax:   106.10 cm/s LVOT Vmean:  68.350 cm/s LVOT VTI:    0.118 m  AORTA Ao Root diam: 3.70 cm Ao Asc diam:  3.30 cm MITRAL VALVE               TRICUSPID VALVE MV Area (PHT): 5.01 cm    TR Peak grad:   48.2 mmHg MV Decel Time: 152 msec    TR Vmax:        347.00 cm/s MV E velocity: 87.55 cm/s                            SHUNTS                            Systemic VTI:  0.12 m                            Systemic Diam: 2.40 cm Dani Gobble Croitoru MD Electronically signed by Sanda Klein MD Signature Date/Time: 04/13/2022/4:06:09 PM    Final    DG Chest Port 1 View  Result Date: 04/13/2022 CLINICAL DATA:  Dyspnea EXAM: PORTABLE CHEST 1 VIEW COMPARISON:  Previous studies including the examination of 04/11/2022 FINDINGS: There is opacification of left hemithorax consistent with left pneumonectomy. Increased interstitial and alveolar markings are seen in right lower lung field with interval worsening. There is improvement in the aeration of right upper lung field. Small right pleural  effusion is seen. There is no pneumothorax. IMPRESSION: There is interval worsening of infiltrate in right lower lung fields suggesting worsening pneumonia. There is improvement in the aeration in right upper lung field suggesting decrease in interstitial edema or interstitial pneumonia. Small right pleural effusion. Electronically Signed   By: Elmer Picker M.D.   On: 04/13/2022 10:11   CT Angio Chest PE W and/or Wo Contrast  Result Date: 04/11/2022 CLINICAL DATA:  Pulmonary embolism (PE) suspected, unknown D-dimer Shortness of breath. Radiologic records indicates history of lung cancer, prior pneumonectomy. EXAM: CT ANGIOGRAPHY CHEST WITH CONTRAST TECHNIQUE: Multidetector CT imaging of the chest was performed using the standard protocol during bolus administration of intravenous contrast. Multiplanar CT image reconstructions and MIPs were obtained to evaluate the vascular anatomy. RADIATION DOSE REDUCTION: This exam was performed according to the departmental dose-optimization program which includes automated exposure control, adjustment of the mA and/or kV according to patient size and/or use of iterative reconstruction technique. CONTRAST:  57m OMNIPAQUE IOHEXOL 350 MG/ML SOLN COMPARISON:  Chest radiograph earlier today. CT 03/17/2022. PET CT 03/12/2022 FINDINGS: Cardiovascular: There are no filling defects within the pulmonary arteries to suggest pulmonary embolus. Chronic central enlargement of the main pulmonary artery. Grossly normal heart size, shifted to the left due to pneumonectomy changes. There are coronary artery calcifications. Atherosclerosis of the thoracic aorta without aneurysm or dissection. Mediastinum/Nodes: Enlarging precarinal node measuring 2.8 x 4.2 cm, series 5, image 52, previously 3.6 x 3 cm. 19 mm subcarinal node series 5, image 65, previously 18 mm. There is a 2 x 4 cm right hilar node  series 5, image 66, comparison with prior is limited due to lack contrast on that exam.  Low right hilar nodes measure up to 12 mm. Scattered retained debris in the esophagus. Stable volume loss in the left hemithorax post pneumonectomy with mediastinal shift. 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 Small to moderate-sized right pleural effusion is new from prior exam. There are new areas of ground-glass and reticulonodular opacities throughout the right lung, predominantly involving the right upper and middle lobes. Previous right upper lobe cavitary nodule is not well assessed on the current exam due to motion, in the region of series 11, image 44. r medial right apical nodule has increased in size 13 x 9 mm series 11, image 34, previously 10 x 7 mm. Enlarging right apical nodule at 8 mm series 11, image 27, previously 4 mm. There is narrowing of the right mainstem bronchus with middle and lower lobe bronchial thickening. Occasional areas of mucoid impaction in the lower lobe. Breathing motion artifact limits detailed assessment. Upper Abdomen: Low-density liver lesions in the left lobe is unchanged from recent chest CT, series 5, image 125 there are multiple additional low-density lesions in the right and left hepatic lobe. These are not definitively seen on recent CT under typical of metastatic disease. Musculoskeletal: Patient with known osseous metastatic disease. Lucency involving the posterior aspect of T3 vertebral body, series 8, image 111. Lucent lesion in the manubrium is again seen. Left lateral ninth rib fracture is new. There are multiple remote left rib fractures and postsurgical change in the left hemithorax. Review of the MIP images confirms the above findings. IMPRESSION: 1. No pulmonary embolus. 2. New areas of ground-glass and reticulonodular opacities throughout the right lung, predominantly involving the right upper and middle lobes. This is likely infectious, including atypical viral organisms. 3. Small to moderate right pleural  effusion is new from prior exam. 4. Right hilar adenopathy causes narrowing of the right mainstem bronchus. 5. Enlarging mediastinal adenopathy consistent with worsening nodal disease. 6. Enlarging nodules in the right upper lobe. One of the nodules on prior chest CT is obscured on the current exam due to motion. 7. Left lateral ninth rib fracture is new. Osseous metastatic disease involving the sternum and T3 vertebral body, better demonstrated on prior PET again seen. 8. There are multiple low-density lesions in the liver that were not definitively seen on recent prior, and suspicious for metastatic disease. This is not well assessed on this phase of contrast tailored to pulmonary artery assessment. Aortic Atherosclerosis (ICD10-I70.0). Electronically Signed   By: Keith Rake M.D.   On: 04/11/2022 16:25   DG Chest 2 View  Result Date: 04/11/2022 CLINICAL DATA:  Shortness of breath. History of LEFT pneumonectomy for LEFT lung cancer. EXAM: CHEST - 2 VIEW COMPARISON:  03/17/2022 CT, 06/09/2021 radiograph and prior studies FINDINGS: LEFT pneumonectomy changes again noted. Interstitial and airspace opacities within the RIGHT lung are now noted. A trace RIGHT pleural effusions present. There is no evidence of pneumothorax. No acute bony abnormalities are identified. IMPRESSION: Interstitial and airspace opacities within the RIGHT lung with trace RIGHT pleural effusion. This may represent pulmonary edema or infection. Electronically Signed   By: Margarette Canada M.D.   On: 04/11/2022 14:23     ASSESSMENT/PLAN:  This is a very pleasant 66 years old Caucasian male with stage IV (T1a, N2, M1 C) non-small cell lung cancer, squamous cell carcinoma presented with right upper lobe lung nodule in addition to right  hilar and mediastinal lymphadenopathy and metastatic disease to several bone areas as well as musculoskeletal muscles diagnosed in October 2023. The patient also has a history of a stage IIIa non-small cell  lung cancer, squamous cell carcinoma diagnosed in June 2009 status post left pneumonectomy followed by 4 cycles of adjuvant systemic chemotherapy completed in April 29, 2008 and has been on observation since that time.  The patient is positive for K-ras G12 C mutation which could be targeted in the second line setting.  His PD-L1 expression is ***  The patient is currently undergoing palliative systemic chemotherapy with carboplatin for an AUC of 5 and paclitaxel 175 mg per metered square, Keytruda 200 mg IV every 3 weeks with Neulasta support.  He is status post 1 cycle of treatment.  He was hospitalized a few days after treatment secondary to hypoxia.  ****Any chance that the groundglass nodularity could be a pneumonitis?  Treated with IV antibiotics?  The patient was seen with [name] today.  [name] had a lengthy discussion with the patient today about his current condition and recommended treatment options.  [name] opted to***dose reduction?  Complete treatment?  We will see him back for follow-up visit in ***weeks for evaluation repeat blood work before undergoing cycle #3.  He will continue to follow with cardiology regarding his A-fib.  Vision loss?  Hypotension?  Bradycardia  Blood thinner?  Bleeding?  Supplemental oxygen  The patient was advised to call immediately if she has any concerning symptoms in the interval. The patient voices understanding of current disease status and treatment options and is in agreement with the current care plan. All questions were answered. The patient knows to call the clinic with any problems, questions or concerns. We can certainly see the patient much sooner if necessary         No orders of the defined types were placed in this encounter.    I spent {CHL ONC TIME VISIT - MNOTR:[id number]} counseling the patient face to face. The total time spent in the appointment was {CHL ONC TIME VISIT - BFXOV:[id number]}.  [place] Winslow L  Goldy Calandra, PA-C 04/23/22

## 2022-04-27 ENCOUNTER — Telehealth: Payer: Self-pay

## 2022-04-27 ENCOUNTER — Encounter (HOSPITAL_BASED_OUTPATIENT_CLINIC_OR_DEPARTMENT_OTHER): Payer: Self-pay | Admitting: Family

## 2022-04-27 ENCOUNTER — Ambulatory Visit (INDEPENDENT_AMBULATORY_CARE_PROVIDER_SITE_OTHER): Payer: Medicare Other | Admitting: Family

## 2022-04-27 VITALS — BP 120/62 | Ht 70.0 in | Wt 178.1 lb

## 2022-04-27 DIAGNOSIS — I480 Paroxysmal atrial fibrillation: Secondary | ICD-10-CM

## 2022-04-27 MED ORDER — METOPROLOL SUCCINATE ER 25 MG PO TB24: 25.0000 mg | ORAL_TABLET | Freq: Every day | ORAL | 1 refills | Status: AC

## 2022-04-27 MED FILL — Dexamethasone Sodium Phosphate Inj 100 MG/10ML: INTRAMUSCULAR | Qty: 1 | Status: AC

## 2022-04-27 MED FILL — Fosaprepitant Dimeglumine For IV Infusion 150 MG (Base Eq): INTRAVENOUS | Qty: 5 | Status: AC

## 2022-04-27 NOTE — Patient Instructions (Addendum)
Medication Instructions:  Your physician has recommended you make the following change in your medication:   Stop Diltiazem   Start Metoprolol Succinate 25mg  daily   STOP ELIQUIS   *If you need a refill on your cardiac medications before your next appointment, please call your pharmacy*   Lab Work/Testing/Procedures: Your EKG today showed sinus rhythm which is a good.    Follow-Up: At [facility], you and your health needs are our priority.  As part of our continuing mission to provide you with exceptional heart care, we have created designated Provider Care Teams.  These Care Teams include your primary Cardiologist (physician) and Advanced Practice Providers (APPs -  Physician Assistants and Nurse Practitioners) who all work together to provide you with the care you need, when you need it.  We recommend signing up for the patient portal called "MyChart".  Sign up information is provided on this After Visit Summary.  MyChart is used to connect with patients for Virtual Visits (Telemedicine).  Patients are able to view lab/test results, encounter notes, upcoming appointments, etc.  Non-urgent messages can be sent to your provider as well.   To learn more about what you can do with MyChart, go to [email or web address].    Your next appointment:   2-3 month(s)  The format for your next appointment:   In Person  Provider:   Buford Dresser, MD    Other Instructions  Atrial Fibrillation  Atrial fibrillation is a type of heartbeat that is irregular or fast. If you have this condition, your heart beats without any order. This makes it hard for your heart to pump blood in a normal way. Atrial fibrillation may come and go, or it may become a long-lasting problem. If this condition is not treated, it can put you at higher risk for stroke, heart failure, and other heart problems. What are the causes? This condition may be caused by diseases that damage the heart. They  include: High blood pressure. Heart failure. Heart valve disease. Heart surgery. Other causes include: Diabetes. Thyroid disease. Being overweight. Kidney disease. Sometimes the cause is not known. What increases the risk? You are more likely to develop this condition if: You are older. You smoke. You exercise often and very hard. You have a family history of this condition. You are a man. You use drugs. You drink a lot of alcohol. You have lung conditions, such as emphysema, pneumonia, or COPD. You have sleep apnea. What are the signs or symptoms? Common symptoms of this condition include: A feeling that your heart is beating very fast. Chest pain or discomfort. Feeling short of breath. Suddenly feeling light-headed or weak. Getting tired easily during activity. Fainting. Sweating. In some cases, there are no symptoms. How is this treated? Treatment for this condition depends on underlying conditions and how you feel when you have atrial fibrillation. They include: Medicines to: Prevent blood clots. Treat heart rate or heart rhythm problems. Using devices, such as a pacemaker, to correct heart rhythm problems. Doing surgery to remove the part of the heart that sends bad signals. Closing an area where clots can form in the heart (left atrial appendage). In some cases, your doctor will treat other underlying conditions. Follow these instructions at home: Medicines Take over-the-counter and prescription medicines only as told by your doctor. Do not take any new medicines without first talking to your doctor. If you are taking blood thinners: Talk with your doctor before you take any medicines that have aspirin or NSAIDs,  such as ibuprofen, in them. Take your medicine exactly as told by your doctor. Take it at the same time each day. Avoid activities that could hurt or bruise you. Follow instructions about how to prevent falls. Wear a bracelet that says you are taking  blood thinners. Or, carry a card that lists what medicines you take. Lifestyle     Do not use any products that have nicotine or tobacco in them. These include cigarettes, e-cigarettes, and chewing tobacco. If you need help quitting, ask your doctor. Eat heart-healthy foods. Talk with your doctor about the right eating plan for you. Exercise regularly as told by your doctor. Do not drink alcohol. Lose weight if you are overweight. Do not use drugs, including cannabis. General instructions If you have a condition that causes breathing to stop for a short period of time (apnea), treat it as told by your doctor. Keep a healthy weight. Do not use diet pills unless your doctor says they are safe for you. Diet pills may make heart problems worse. Keep all follow-up visits as told by your doctor. This is important. Contact a doctor if: You notice a change in the speed, rhythm, or strength of your heartbeat. You are taking a blood-thinning medicine and you get more bruising. You get tired more easily when you move or exercise. You have a sudden change in weight. Get help right away if:  You have pain in your chest or your belly (abdomen). You have trouble breathing. You have side effects of blood thinners, such as blood in your vomit, poop (stool), or pee (urine), or bleeding that cannot stop. You have any signs of a stroke. "BE FAST" is an easy way to remember the main warning signs: B - Balance. Signs are dizziness, sudden trouble walking, or loss of balance. E - Eyes. Signs are trouble seeing or a change in how you see. F - Face. Signs are sudden weakness or loss of feeling in the face, or the face or eyelid drooping on one side. A - Arms. Signs are weakness or loss of feeling in an arm. This happens suddenly and usually on one side of the body. S - Speech. Signs are sudden trouble speaking, slurred speech, or trouble understanding what people say. T - Time. Time to call emergency services.  Write down what time symptoms started. You have other signs of a stroke, such as: A sudden, very bad headache with no known cause. Feeling like you may vomit (nausea). Vomiting. A seizure. These symptoms may be an emergency. Do not wait to see if the symptoms will go away. Get medical help right away. Call your local emergency services (911 in the [place] Do not drive yourself to the hospital. Summary Atrial fibrillation is a type of heartbeat that is irregular or fast. You are at higher risk of this condition if you smoke, are older, have diabetes, or are overweight. Follow your doctor's instructions about medicines, diet, exercise, and follow-up visits. Get help right away if you have signs or symptoms of a stroke. Get help right away if you cannot catch your breath, or you have chest pain or discomfort. This information is not intended to replace advice given to you by your health care provider. Make sure you discuss any questions you have with your health care provider. Document Revised: 11/15/2018 Document Reviewed: 11/15/2018 Elsevier Patient Education  [place].

## 2022-04-27 NOTE — Telephone Encounter (Signed)
Number for central scheduling was given to the pt's wife, also reminded her of the pt's lab appt at 930 prior to his oncology appt at 51. Pt's wife verbalized understanding.

## 2022-04-27 NOTE — Progress Notes (Signed)
Office Visit    Patient Name: Benjamin Case Date of Encounter: 04/27/2022  PCP:  [name], [place]  Cardiologist:  Buford Dresser, MD  Advanced Practice Provider:  No care team member to display Electrophysiologist:  None      Chief Complaint    Benjamin Case is a 66 y.o. male presents today for hospital follow up   Past Medical History    Past Medical History:  Diagnosis Date   Arthritis    back   COPD (chronic obstructive pulmonary disease) ([place])    Diabetes mellitus without complication ([place])    no medications , monitors cbg at home with monitor, avg no more than 150s at home    Dyspnea    increased exertion; pt states can climb flight of stairs w/o having to stop prior to reaching top ; ongoing reports it is due to "having one lung"    GERD (gastroesophageal reflux disease)    Hepatitis    hepatitis C; pt states competed treatments, unsure what medication he was treated with    History of chemotherapy    Hypertension    reports its been over a year since he stopped taking bp medication, reports he made her medical doctor aware that he was topping it    lung ca dx'd 12/2007   lt lobectomy   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   and back - see note in [facility] 12/26/21   Numbness    FEET BILATERAL   Pneumonia 2009   Right upper lobe pulmonary nodule 04/29/2021   Rotator cuff tear    right    Past Surgical History:  Procedure Laterality Date   ARTHROSCOPIC REPAIR ACL Right    BRONCHIAL BIOPSY  06/09/2021   Procedure: BRONCHIAL BIOPSIES;  Surgeon: Garner Nash, DO;  Location: [facility];  Service: Pulmonary;;   BRONCHIAL BRUSHINGS  06/09/2021   Procedure: BRONCHIAL BRUSHINGS;  Surgeon: Garner Nash, DO;  Location: [place] [facility];  Service: Pulmonary;;   BRONCHIAL WASHINGS  06/09/2021   Procedure: BRONCHIAL WASHINGS;  Surgeon: Garner Nash, DO;  Location: [facility];  Service: Pulmonary;;   CATARACT  EXTRACTION, BILATERAL  2019   DECOMPRESSIVE LUMBAR LAMINECTOMY LEVEL 1 N/A 03/24/2016   Procedure: L3-4 CENTRAL DECOMPRESSION LUMBER LAMINECTOMY;  Surgeon: Latanya Maudlin, MD;  Location: [facility];  Service: Orthopedics;  Laterality: N/A;   FINE NEEDLE ASPIRATION  03/23/2022   Procedure: FINE NEEDLE ASPIRATION (FNA) LINEAR;  Surgeon: Garner Nash, DO;  Location: [place] [facility];  Service: Pulmonary;;   FOOT SURGERY Bilateral    HAND SURGERY Bilateral    LOBECTOMY Left 2009   pneumonectomy Left 2009   [name]   SHOULDER ARTHROSCOPY WITH DISTAL CLAVICLE RESECTION Right 12/14/2018   Procedure: SHOULDER ARTHROSCOPY WITH DISTAL CLAVICLE RESECTION;  Surgeon: Tania Ade, MD;  Location: [facility];  Service: Orthopedics;  Laterality: Right;   SHOULDER ARTHROSCOPY WITH ROTATOR CUFF REPAIR Right 12/14/2018   Procedure: SHOULDER ARTHROSCOPY WITH ROTATOR CUFF REPAIR;  Surgeon: Tania Ade, MD;  Location: [facility];  Service: Orthopedics;  Laterality: Right;   SHOULDER ARTHROSCOPY WITH SUBACROMIAL DECOMPRESSION Right 12/14/2018   Procedure: SHOULDER ARTHROSCOPY WITH SUBACROMIAL DECOMPRESSION;  Surgeon: Tania Ade, MD;  Location: [facility];  Service: Orthopedics;  Laterality: Right;   TRIGGER FINGER RELEASE     right hand, pointer finger    VIDEO BRONCHOSCOPY WITH ENDOBRONCHIAL ULTRASOUND Right 03/23/2022   Procedure: VIDEO BRONCHOSCOPY WITH ENDOBRONCHIAL ULTRASOUND;  Surgeon: Valeta Harms,  Octavio Graves, DO;  Location: [place] [facility];  Service: Pulmonary;  Laterality: Right;   VIDEO BRONCHOSCOPY WITH RADIAL ENDOBRONCHIAL ULTRASOUND  06/09/2021   Procedure: VIDEO BRONCHOSCOPY WITH RADIAL ENDOBRONCHIAL ULTRASOUND;  Surgeon: Garner Nash, DO;  Location: [place] [facility];  Service: Pulmonary;;    Allergies  No Known Allergies  History of Present Illness    Benjamin Case is a 66 y.o. 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  Admitted 11/5 - 04/17/2022 with multifocal pneumonia and sepsis.  Treated with IV antibiotics.  Required home O2 at discharge.  He had new onset atrial fibrillation with RVR while admitted and was discharged on Cardizem and Eliquis.  Since discharge followed with primary care provider after noting dark tarry stools with positive Hemoccult.  As he was hypotensive diltiazem was reduced to 60 mg twice daily.  Presents today for follow up with his wife.  Dark tarry stool resolved 3 days ago.  Has upcoming chemotherapy treatment tomorrow with labs at that time.  Heart rate at home routinely 60s-90s.  Denies palpitations.  BP at home 107/62, 96/54, 116/65. Notes some lightheaded, dizziness with position changes but no near-syncope, syncope.  No chest pain, worsening dyspnea. He drinks 2-3 glasses of water and 2-3 of ginger ale. Eats breakfast and dinner with a snack in between.    EKGs/Labs/Other Studies Reviewed:   The following studies were reviewed today:  Echo 04/13/2022   1. Left ventricular ejection fraction, by estimation, is 55 to 60%. The  left ventricle has normal function. The left ventricle has no regional  wall motion abnormalities. Left ventricular diastolic function could not  be evaluated.   2. Right ventricular systolic function is normal. The right ventricular  size is severely enlarged. There is moderately elevated pulmonary artery  systolic pressure.   3. Right atrial size was severely dilated.   4. The mitral valve is normal in structure. No evidence of mitral valve  regurgitation. No evidence of mitral stenosis.   5. Tricuspid valve regurgitation is mild to moderate.   6. The aortic valve is tricuspid. Aortic valve regurgitation is not  visualized. No aortic stenosis is present.   7. The inferior vena cava is dilated in size with >50% respiratory   variability, suggesting right atrial pressure of 8 mmHg.    EKG:  EKG is ordered today.  The ekg ordered today demonstrates NSR 94 bpm with no acute ST/T wave changes.   Recent Labs: 04/08/2022: TSH 0.447 04/11/2022: ALT 39 04/13/2022: B Natriuretic Peptide 525.5 04/15/2022: Magnesium 1.8 04/17/2022: BUN 40; Creatinine, Ser 1.28; Hemoglobin 9.9; Platelets 212; Potassium 4.3; Sodium 129  Recent Lipid Panel No results found for: "CHOL", "TRIG", "HDL", "CHOLHDL", "VLDL", "LDLCALC", "LDLDIRECT"  Risk Assessment/Calculations:   CHA2DS2-VASc Score = 2   This indicates a 2.2% annual risk of stroke. The patient's score is based upon: CHF History: 0 HTN History: 1 Diabetes History: 0 Stroke History: 0 Vascular Disease History: 0 Age Score: 1 Gender Score: 0     Home Medications   Current Meds  Medication Sig   ALPRAZolam (XANAX) 1 MG tablet Take 1 mg by mouth 3 (three) times daily as needed for anxiety.   Ascorbic Acid (VITAMIN C) 1000 MG tablet Take 1,000 mg by mouth daily.   aspirin EC 81 MG tablet Take 81 mg by mouth daily.  Aspirin-Caffeine (BC FAST PAIN RELIEF) 845-65 MG PACK Take 1 packet by mouth daily as needed (pain.).   atorvastatin (LIPITOR) 10 MG tablet Take 10 mg by mouth at bedtime.    B Complex-C (SUPER B COMPLEX PO) Take 1 tablet by mouth daily.   esomeprazole (NEXIUM) 40 MG capsule Take 40 mg by mouth daily as needed (for heartburn).    folic acid (FOLVITE) 1 MG tablet Take 1 tablet (1 mg total) by mouth daily.   guaiFENesin (MUCINEX) 600 MG 12 hr tablet Take 600 mg by mouth 2 (two) times daily.   ipratropium-albuterol (DUONEB) 0.5-2.5 (3) MG/3ML SOLN Take 3 mLs by nebulization 2 (two) times daily.   methocarbamol (ROBAXIN) 500 MG tablet Take 500 mg by mouth 2 (two) times daily as needed for muscle spasms.   metoprolol succinate (TOPROL XL) 25 MG 24 hr tablet Take 1 tablet (25 mg total) by mouth daily.   Omega-3 Fatty Acids ([employer] FISH OIL) 1000 MG CAPS Take 1,000 mg  by mouth every evening.   oxyCODONE-acetaminophen (PERCOCET) 10-325 MG tablet Take 1 tablet by mouth 5 (five) times daily as needed for pain.   pregabalin (LYRICA) 75 MG capsule Take 75 mg by mouth 2 (two) times daily.   prochlorperazine (COMPAZINE) 10 MG tablet Take 1 tablet (10 mg total) by mouth every 6 (six) hours as needed for nausea or vomiting.   thiamine (VITAMIN B-1) 100 MG tablet Take 1 tablet (100 mg total) by mouth daily.   thiamine (VITAMIN B1) 100 MG tablet Take 100 mg by mouth daily.   vitamin B-12 (CYANOCOBALAMIN) 1000 MCG tablet Take 1,000 mcg by mouth daily.   [DISCONTINUED] diltiazem (CARDIZEM SR) 60 MG 12 hr capsule Take 60 mg by mouth 2 (two) times daily.     Review of Systems    All other systems reviewed and are otherwise negative except as noted above.  Physical Exam    VS:  BP 120/62   Ht 5\' 10"  (1.778 m)   Wt 178 lb 1.6 oz (80.8 kg)   BMI 25.55 kg/m  , BMI Body mass index is 25.55 kg/m.  Wt Readings from Last 3 Encounters:  04/27/22 178 lb 1.6 oz (80.8 kg)  04/11/22 196 lb 10.4 oz (89.2 kg)  04/01/22 176 lb 6 oz (80 kg)     GEN: Well nourished, well developed, in no acute distress. HEENT: normal. Neck: Supple, no JVD, carotid bruits, or masses. Cardiac: RRR, no murmurs, rubs, or gallops. No clubbing, cyanosis, edema.  Radials/PT 2+ and equal bilaterally.  Respiratory:  Respirations regular and unlabored, clear to auscultation bilaterally. On Home O2.  GI: Soft, nontender, nondistended. MS: No deformity or atrophy. Skin: Warm and dry, no rash. Neuro:  Strength and sensation are intact. Psych: Normal affect.  Assessment & Plan    PAF - In setting of lung cancer, pneumonia. Echo 04/2022 normal LVEF, moderately elevated PASP, mild to moderate TR. EKG today shows he is maintaining sinus rhythm.  Hypotension at home with SBP 90s-100s with lightheadedness.  Most often with position change, consistent with orthostatic hypotension.  Education provided on  orthostatic precautions: Stay well hydrated, eat regular meals, wear compression socks, make position changes slowly.  Due to hypotension we will discontinue diltiazem.  Start Toprol 25 mg daily.  Check in via MyChart in 1 week to ensure heart rate routinely less than 100 bpm. Given positive hemoccult, remain off Eliquis.   Lung cancer - Next chemotherapy tomorrow. Follows with oncology.  Disposition: Follow up in 3 month(s) with Buford Dresser, MD or APP.  Signed, Loel Dubonnet, NP 04/27/2022, 1:53 PM [facility]

## 2022-04-28 ENCOUNTER — Telehealth: Payer: Self-pay | Admitting: Internal Medicine

## 2022-04-28 ENCOUNTER — Ambulatory Visit: Payer: Medicare Other

## 2022-04-28 ENCOUNTER — Other Ambulatory Visit: Payer: Medicare Other

## 2022-04-28 ENCOUNTER — Ambulatory Visit (HOSPITAL_COMMUNITY)
Admission: RE | Admit: 2022-04-28 | Discharge: 2022-04-28 | Disposition: A | Discharge status: Home / Self Care | Payer: Medicare Other | Source: Ambulatory Visit | Attending: Physician Assistant | Admitting: Physician Assistant

## 2022-04-28 ENCOUNTER — Inpatient Hospital Stay: Payer: Medicare Other

## 2022-04-28 ENCOUNTER — Other Ambulatory Visit: Payer: Self-pay

## 2022-04-28 ENCOUNTER — Inpatient Hospital Stay (HOSPITAL_BASED_OUTPATIENT_CLINIC_OR_DEPARTMENT_OTHER): Payer: Medicare Other | Admitting: Physician Assistant

## 2022-04-28 VITALS — BP 151/75 | HR 99 | Temp 98.2°F | Resp 20 | Ht 70.0 in | Wt 176.7 lb

## 2022-04-28 DIAGNOSIS — R195 Other fecal abnormalities: Secondary | ICD-10-CM

## 2022-04-28 DIAGNOSIS — J90 Pleural effusion, not elsewhere classified: Secondary | ICD-10-CM | POA: Insufficient documentation

## 2022-04-28 DIAGNOSIS — R0602 Shortness of breath: Secondary | ICD-10-CM

## 2022-04-28 DIAGNOSIS — C3411 Malignant neoplasm of upper lobe, right bronchus or lung: Secondary | ICD-10-CM | POA: Insufficient documentation

## 2022-04-28 DIAGNOSIS — Z79899 Other long term (current) drug therapy: Secondary | ICD-10-CM | POA: Diagnosis not present

## 2022-04-28 DIAGNOSIS — Z5111 Encounter for antineoplastic chemotherapy: Secondary | ICD-10-CM | POA: Diagnosis present

## 2022-04-28 LAB — CBC WITH DIFFERENTIAL (CANCER CENTER ONLY)
Abs Immature Granulocytes: 0.06 10*3/uL (ref 0.00–0.07)
Basophils Absolute: 0.1 10*3/uL (ref 0.0–0.1)
Basophils Relative: 1 %
Eosinophils Absolute: 0.1 10*3/uL (ref 0.0–0.5)
Eosinophils Relative: 1 %
HCT: 28.1 % — ABNORMAL LOW (ref 39.0–52.0)
Hemoglobin: 9.6 g/dL — ABNORMAL LOW (ref 13.0–17.0)
Immature Granulocytes: 0 %
Lymphocytes Relative: 11 %
Lymphs Abs: 1.6 10*3/uL (ref 0.7–4.0)
MCH: 33.8 pg (ref 26.0–34.0)
MCHC: 34.2 g/dL (ref 30.0–36.0)
MCV: 98.9 fL (ref 80.0–100.0)
Monocytes Absolute: 1.1 10*3/uL — ABNORMAL HIGH (ref 0.1–1.0)
Monocytes Relative: 8 %
Neutro Abs: 10.8 10*3/uL — ABNORMAL HIGH (ref 1.7–7.7)
Neutrophils Relative %: 79 %
Platelet Count: 233 10*3/uL (ref 150–400)
RBC: 2.84 MIL/uL — ABNORMAL LOW (ref 4.22–5.81)
RDW: 12 % (ref 11.5–15.5)
WBC Count: 13.7 10*3/uL — ABNORMAL HIGH (ref 4.0–10.5)
nRBC: 0 % (ref 0.0–0.2)

## 2022-04-28 LAB — CMP (CANCER CENTER ONLY)
ALT: 27 U/L (ref 0–44)
AST: 22 U/L (ref 15–41)
Albumin: 3.2 g/dL — ABNORMAL LOW (ref 3.5–5.0)
Alkaline Phosphatase: 70 U/L (ref 38–126)
Anion gap: 8 (ref 5–15)
BUN: 12 mg/dL (ref 8–23)
CO2: 31 mmol/L (ref 22–32)
Calcium: 8.2 mg/dL — ABNORMAL LOW (ref 8.9–10.3)
Chloride: 96 mmol/L — ABNORMAL LOW (ref 98–111)
Creatinine: 1.22 mg/dL (ref 0.61–1.24)
GFR, Estimated: >60 mL/min (ref >60)
Glucose, Bld: 111 mg/dL — ABNORMAL HIGH (ref 70–99)
Potassium: 4.5 mmol/L (ref 3.5–5.1)
Sodium: 135 mmol/L (ref 135–145)
Total Bilirubin: 0.4 mg/dL (ref 0.3–1.2)
Total Protein: 6.5 g/dL (ref 6.5–8.1)

## 2022-04-28 LAB — SAMPLE TO BLOOD BANK

## 2022-04-28 MED ORDER — METHYLPREDNISOLONE 4 MG PO TBPK: ORAL_TABLET | ORAL | 0 refills | Status: DC

## 2022-04-28 MED ORDER — METHYLPREDNISOLONE SODIUM SUCC 125 MG IJ SOLR
125.0000 mg | Freq: Once | INTRAMUSCULAR | Status: AC
  Administered 2022-04-28: 125 mg via INTRAMUSCULAR
  Filled 2022-04-28: qty 2

## 2022-04-28 MED ORDER — METHYLPREDNISOLONE SODIUM SUCC 125 MG IJ SOLR: 125.0000 mg | Freq: Once | INTRAMUSCULAR | Status: DC

## 2022-04-28 NOTE — Telephone Encounter (Signed)
Called patient regarding upcoming appointments, patient is notified. 

## 2022-04-30 ENCOUNTER — Ambulatory Visit: Payer: Medicare Other

## 2022-04-30 ENCOUNTER — Inpatient Hospital Stay: Payer: Medicare Other

## 2022-05-01 ENCOUNTER — Ambulatory Visit: Payer: Medicare Other

## 2022-05-04 ENCOUNTER — Ambulatory Visit (HOSPITAL_COMMUNITY)
Admission: RE | Admit: 2022-05-04 | Discharge: 2022-05-04 | Disposition: A | Discharge status: Home / Self Care | Payer: Medicare Other | Source: Ambulatory Visit | Attending: Internal Medicine | Admitting: Internal Medicine

## 2022-05-04 ENCOUNTER — Encounter (HOSPITAL_BASED_OUTPATIENT_CLINIC_OR_DEPARTMENT_OTHER): Payer: Self-pay

## 2022-05-04 ENCOUNTER — Other Ambulatory Visit: Payer: Self-pay

## 2022-05-04 DIAGNOSIS — C3490 Malignant neoplasm of unspecified part of unspecified bronchus or lung: Secondary | ICD-10-CM | POA: Insufficient documentation

## 2022-05-04 DIAGNOSIS — C3411 Malignant neoplasm of upper lobe, right bronchus or lung: Secondary | ICD-10-CM

## 2022-05-04 MED ORDER — GADOBUTROL 1 MMOL/ML IV SOLN
8.0000 mL | Freq: Once | INTRAVENOUS | Status: AC | PRN
  Administered 2022-05-04: 8 mL via INTRAVENOUS

## 2022-05-04 NOTE — Progress Notes (Unsigned)
[facility] OFFICE PROGRESS NOTE  [name], MD [address] [place] [address]  DIAGNOSIS: Stage IV (T1a, N2, M1 C) non-small cell lung cancer, squamous cell carcinoma presented with right upper lobe lung nodule in addition to right hilar and mediastinal lymphadenopathy and metastatic disease to several bone areas as well as musculoskeletal muscles diagnosed in October 2023. The patient also has a history of a stage IIIa non-small cell lung cancer, squamous cell carcinoma diagnosed in June 2009    Detected Alteration(s) / Biomarker(s)         Associated FDA-approved therapies Clinical Trial Availability          % cfDNA or Amplification KRAS G12C approved by FDA Adagrasib, Sotorasib Yes    6.8%   TP53 S241C None Yes          0.9%   TP53 Q136E None Yes          0.5%  PRIOR THERAPY: 1) status post left pneumonectomy in 2009 2) 4 cycles of adjuvant chemotherapy completed in April 29, 2008   CURRENT THERAPY: Palliative systemic chemotherapy with carboplatin for an AUC of 5, paclitaxel 175 mg per metered square, and converted to 100 mg IV every 3 weeks with Neulasta support.  Status post 1 cycle.  First dose of treatment on 04/08/22   INTERVAL HISTORY: Benjamin Case 66 y.o. male returns to the clinic today for a follow-up visit accompanied by his wife.  The patient was last seen by [name] and myself last week.  Unfortunately, the patient was recently diagnosed with metastatic lung cancer.  He was hospitalized after cycle #1 of treatment in November 2023 for worsening shortness of breath and he was treated for pneumonia.  He also was started on blood thinners for atrial fibrillation, which was noted during this hospitalization, but this caused black tarry stools and this was ultimately discontinued. He had hemoccult testing at his PCPs office and his eliquis was discontinued at that time.  His stools have returned to normal color at this time.   The patient had a reported colonoscopy approximately 2 months ago under the care of Dr. Watt Climes at [facility] which was reportedly normal except for benign polyps.   The patient denies any abnormal bleeding.  He does have some bruising on his abdomen.  His hemoglobin is uptrending and is at 12.5 today from 9.6 last week.  Due to his atrial fibrillation, the patient was previously on diltiazem 60 mg twice daily. It was discontinued at his cardiology office last week due to hypotension per chart review.  He is currently on metoprolol XL 25 mg.  He takes his medication as prescribed and took a dose this morning.  The patient denies any palpitations at this time.  He denies any lightheadedness, syncope, or changes with his baseline shortness of breath.  Last week the patient was having shortness of breath approximately 1 day.  He had a chest x-ray performed.  [name] recommended Solu-Medrol at that time and a Medrol Dosepak.  The patient completed this and states that his breathing is at his baseline.  At baseline he is on 2.5 L of supplemental oxygen via nasal cannula.  He had an EKG performed last week and he was in normal sinus rhythm at that time.  His treatment (cycle #2) was delayed last week until he can be reevaluated today for improvement in his breathing.  On 04/19/2022, the patient called his cardiologist office after reporting  new onset bilateral vision loss lasting approximately 10 to 15 minutes.  Due to concern of possible stroke/TIA due to his atrial fibrillation he was instructed for him to go to the nearest emergency room.  The patient refused to go to the emergency room.  The patient states his vision returned to normal after the 10 to 15-minute period.  He reports his vision is at his baseline.  He does have baseline blurry vision in his left eye due to cataracts which has been occurring for approximately 6 months.  He has a history of needing cataract surgery.  The patient denies any other  symptoms of stroke including speech changes, extremity weakness, facial droop, falls, lightheadedness, or numbness and tingling.  The patient had his staging brain MRI today which does show numerous punctate foci of abnormal enhancement in the cerebral hemispheres, particularly in the occipital lobes.  The neuroradiologist felt that the larger occipital lobe lesion are convincing for subacute infarction. The radiologist had a challenging time excluding coexisting small metastatic lesions at this time and recommended repeat imaging in 6 weeks.  It is likely that this subacute infarct occurred on 11/13 with the patient's associated vision loss stated above.  Today he denies any fever, chills, or night sweats.  He denies any cough, chest pain, or hemoptysis.  Denies any nausea, vomiting, diarrhea, or constipation.   Denies any rashes or skin changes.  He is here today for evaluation repeat blood work before considering starting cycle #2 to schedule for tomorrow morning.  The patient is extremely reluctant to be evaluated in the emergency room.   MEDICAL HISTORY: Past Medical History:  Diagnosis Date   Arthritis    back   COPD (chronic obstructive pulmonary disease) ([facility])    Diabetes mellitus without complication (HCC)    no medications , monitors cbg at home with monitor, avg no more than 150s at home    Dyspnea    increased exertion; pt states can climb flight of stairs w/o having to stop prior to reaching top ; ongoing reports it is due to "having one lung"    GERD (gastroesophageal reflux disease)    Hepatitis    hepatitis C; pt states competed treatments, unsure what medication he was treated with    History of chemotherapy    Hypertension    reports its been over a year since he stopped taking bp medication, reports he made her medical doctor aware that he was topping it    lung ca dx'd 12/2007   lt lobectomy   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   and back - see note in [facility] 12/26/21   Numbness     FEET BILATERAL   Pneumonia 2009   Right upper lobe pulmonary nodule 04/29/2021   Rotator cuff tear    right     ALLERGIES:  has No Known Allergies.  MEDICATIONS:  Current Outpatient Medications  Medication Sig Dispense Refill   meloxicam (MOBIC) 15 MG tablet Take 15 mg by mouth daily.     ALPRAZolam (XANAX) 1 MG tablet Take 1 mg by mouth 3 (three) times daily as needed for anxiety.  5   Ascorbic Acid (VITAMIN C) 1000 MG tablet Take 1,000 mg by mouth daily.     aspirin EC 81 MG tablet Take 81 mg by mouth daily.     Aspirin-Caffeine (BC FAST PAIN RELIEF) 845-65 MG PACK Take 1 packet by mouth daily as needed (pain.).     atorvastatin (LIPITOR) 10 MG tablet Take  10 mg by mouth at bedtime.      B Complex-C (SUPER B COMPLEX PO) Take 1 tablet by mouth daily.     esomeprazole (NEXIUM) 40 MG capsule Take 40 mg by mouth daily as needed (for heartburn).      folic acid (FOLVITE) 1 MG tablet Take 1 tablet (1 mg total) by mouth daily. 30 tablet 1   guaiFENesin (MUCINEX) 600 MG 12 hr tablet Take 600 mg by mouth 2 (two) times daily.     ipratropium-albuterol (DUONEB) 0.5-2.5 (3) MG/3ML SOLN Take 3 mLs by nebulization 2 (two) times daily.     methocarbamol (ROBAXIN) 500 MG tablet Take 500 mg by mouth 2 (two) times daily as needed for muscle spasms.     methylPREDNISolone (MEDROL DOSEPAK) 4 MG TBPK tablet Use as instructed 21 tablet 0   metoprolol succinate (TOPROL XL) 25 MG 24 hr tablet Take 1 tablet (25 mg total) by mouth daily. 90 tablet 1   Omega-3 Fatty Acids ([employer] FISH OIL) 1000 MG CAPS Take 1,000 mg by mouth every evening.     oxyCODONE-acetaminophen (PERCOCET) 10-325 MG tablet Take 1 tablet by mouth 5 (five) times daily as needed for pain.     pregabalin (LYRICA) 75 MG capsule Take 75 mg by mouth 2 (two) times daily.     prochlorperazine (COMPAZINE) 10 MG tablet Take 1 tablet (10 mg total) by mouth every 6 (six) hours as needed for nausea or vomiting. 30 tablet 0   thiamine (VITAMIN B-1) 100  MG tablet Take 1 tablet (100 mg total) by mouth daily. 30 tablet 1   thiamine (VITAMIN B1) 100 MG tablet Take 100 mg by mouth daily.     vitamin B-12 (CYANOCOBALAMIN) 1000 MCG tablet Take 1,000 mcg by mouth daily.     No current facility-administered medications for this visit.    SURGICAL HISTORY:  Past Surgical History:  Procedure Laterality Date   ARTHROSCOPIC REPAIR ACL Right    BRONCHIAL BIOPSY  06/09/2021   Procedure: BRONCHIAL BIOPSIES;  Surgeon: Garner Nash, DO;  Location: [facility];  Service: Pulmonary;;   BRONCHIAL BRUSHINGS  06/09/2021   Procedure: BRONCHIAL BRUSHINGS;  Surgeon: Garner Nash, DO;  Location: [facility];  Service: Pulmonary;;   BRONCHIAL WASHINGS  06/09/2021   Procedure: BRONCHIAL WASHINGS;  Surgeon: Garner Nash, DO;  Location: [facility];  Service: Pulmonary;;   CATARACT EXTRACTION, BILATERAL  2019   DECOMPRESSIVE LUMBAR LAMINECTOMY LEVEL 1 N/A 03/24/2016   Procedure: L3-4 CENTRAL DECOMPRESSION LUMBER LAMINECTOMY;  Surgeon: Latanya Maudlin, MD;  Location: [facility];  Service: Orthopedics;  Laterality: N/A;   FINE NEEDLE ASPIRATION  03/23/2022   Procedure: FINE NEEDLE ASPIRATION (FNA) LINEAR;  Surgeon: Garner Nash, DO;  Location: [facility];  Service: Pulmonary;;   FOOT SURGERY Bilateral    HAND SURGERY Bilateral    LOBECTOMY Left 2009   pneumonectomy Left 2009   [name]   SHOULDER ARTHROSCOPY WITH DISTAL CLAVICLE RESECTION Right 12/14/2018   Procedure: SHOULDER ARTHROSCOPY WITH DISTAL CLAVICLE RESECTION;  Surgeon: Tania Ade, MD;  Location: [facility];  Service: Orthopedics;  Laterality: Right;   SHOULDER ARTHROSCOPY WITH ROTATOR CUFF REPAIR Right 12/14/2018   Procedure: SHOULDER ARTHROSCOPY WITH ROTATOR CUFF REPAIR;  Surgeon: Tania Ade, MD;  Location: [facility];  Service: Orthopedics;  Laterality: Right;   SHOULDER ARTHROSCOPY WITH SUBACROMIAL DECOMPRESSION Right 12/14/2018   Procedure: SHOULDER ARTHROSCOPY WITH SUBACROMIAL  DECOMPRESSION;  Surgeon: Tania Ade, MD;  Location: [facility];  Service: Orthopedics;  Laterality:  Right;   TRIGGER FINGER RELEASE     right hand, pointer finger    VIDEO BRONCHOSCOPY WITH ENDOBRONCHIAL ULTRASOUND Right 03/23/2022   Procedure: VIDEO BRONCHOSCOPY WITH ENDOBRONCHIAL ULTRASOUND;  Surgeon: Garner Nash, DO;  Location: [facility];  Service: Pulmonary;  Laterality: Right;   VIDEO BRONCHOSCOPY WITH RADIAL ENDOBRONCHIAL ULTRASOUND  06/09/2021   Procedure: VIDEO BRONCHOSCOPY WITH RADIAL ENDOBRONCHIAL ULTRASOUND;  Surgeon: Garner Nash, DO;  Location: [facility];  Service: Pulmonary;;    REVIEW OF SYSTEMS:   Review of Systems  Constitutional: Negative for appetite change, chills, fatigue, fever and unexpected weight change.  HENT:   Negative for mouth sores, nosebleeds, sore throat and trouble swallowing.   Eyes: Negative for eye problems and icterus.  Respiratory: Stated for baseline dyspnea on exertion.  Negative for cough, hemoptysis, and wheezing.   Cardiovascular: Negative for chest pain and leg swelling.  Gastrointestinal: Negative for abdominal pain, constipation, diarrhea, nausea and vomiting.  Genitourinary: Negative for bladder incontinence, difficulty urinating, dysuria, frequency and hematuria.   Musculoskeletal: Negative for back pain, gait problem, neck pain and neck stiffness.  Skin: Negative for itching and rash.  Neurological: Negative for dizziness, extremity weakness, gait problem, headaches, light-headedness and seizures.  Hematological: Negative for adenopathy. Does not bruise/bleed easily.  Psychiatric/Behavioral: Negative for confusion, depression and sleep disturbance. The patient is not nervous/anxious.     PHYSICAL EXAMINATION:  Blood pressure 121/73, pulse 82, temperature 98 F (36.7 C), temperature source Oral, resp. rate 16, weight 178 lb (80.7 kg), SpO2 97 %.  ECOG PERFORMANCE STATUS: 1-2  Physical Exam  Constitutional: Oriented to  person, place, and time and well-developed, well-nourished, and in no distress.  HENT:  Head: Normocephalic and atraumatic.  Mouth/Throat: Oropharynx is clear and moist. No oropharyngeal exudate.  Eyes: Conjunctivae are normal. Right eye exhibits no discharge. Left eye exhibits no discharge. No scleral icterus.  Neck: Normal range of motion. Neck supple.  Cardiovascular: Fluctuating rate, irregular rhythm, normal heart sounds and intact distal pulses.   Pulmonary/Chest: Effort normal and breath sounds normal. No wheezes. No rales.  Patient is on supplemental oxygen on 3 L via nasal cannula. Abdominal: Soft. Bowel sounds are normal. Exhibits no distension and no mass. There is no tenderness.  Musculoskeletal: Normal range of motion. Exhibits no edema.  Lymphadenopathy:    No cervical adenopathy.  Neurological: Alert and oriented to person, place, and time. Exhibits muscle wasting.  Gait normal. Coordination normal.  Strength equal and symmetric in upper and lower remedies bilaterally. Skin: Skin is warm and dry. No rash noted. Not diaphoretic. No erythema. No pallor.  Psychiatric: Mood, memory and judgment normal.  Vitals reviewed.  LABORATORY DATA: Lab Results  Component Value Date   WBC 15.9 (H) 05/05/2022   HGB 12.5 (L) 05/05/2022   HCT 36.7 (L) 05/05/2022   MCV 98.4 05/05/2022   PLT 423 (H) 05/05/2022      Chemistry      Component Value Date/Time   NA 135 05/05/2022 1436   NA 138 04/26/2016 1205   K 4.5 05/05/2022 1436   K 5.0 04/26/2016 1205   CL 94 (L) 05/05/2022 1436   CL 103 04/27/2012 1253   CO2 28 05/05/2022 1436   CO2 30 (H) 04/26/2016 1205   BUN 24 (H) 05/05/2022 1436   BUN 6.0 (L) 04/26/2016 1205   CREATININE 1.31 (H) 05/05/2022 1436   CREATININE 1.3 04/26/2016 1205      Component Value Date/Time   CALCIUM 8.9 05/05/2022 1436  CALCIUM 9.8 04/26/2016 1205   ALKPHOS 75 05/05/2022 1436   ALKPHOS 60 04/26/2016 1205   AST 28 05/05/2022 1436   AST 40 (H)  04/26/2016 1205   ALT 56 (H) 05/05/2022 1436   ALT 25 04/26/2016 1205   BILITOT 0.6 05/05/2022 1436   BILITOT 0.47 04/26/2016 1205       RADIOGRAPHIC STUDIES:  MR BRAIN W WO CONTRAST  Result Date: 05/05/2022 CLINICAL DATA:  Non-small cell lung cancer.  Staging. EXAM: MRI HEAD WITHOUT AND WITH CONTRAST TECHNIQUE: Multiplanar, multiecho pulse sequences of the brain and surrounding structures were obtained without and with intravenous contrast. CONTRAST:  97m GADAVIST GADOBUTROL 1 MMOL/ML IV SOLN COMPARISON:  02/28/2008 FINDINGS: Brain: The brainstem is normal. Single punctate focus of restricted diffusion in the posterior right cerebellum favored to represent an acute/subacute cerebellar infarction. There are a few old small vessel cerebellar infarctions but no evidence of enhancing lesion to indicate posterior fossa metastatic disease. Within the cerebral hemispheres, there is a constellation of findings that I think indicate subacute infarctions within both hemispheres, particularly in the occipital lobes but with other scattered punctate foci in the frontal and parietal regions left more than right. It is not possible to exclude the coexistence of small metastatic lesions at this time, and I would recommend repeat imaging in 6 weeks at which time the smaller lesions should have lost their enhancement if they represent punctate embolic infarctions. It is certainly possible that both processes could be present in this case. There is some petechial blood product deposition within the larger occipital infarctions. None of the lesions are associated with vasogenic edema or mass effect. No hydrocephalus or extra-axial collection. There is an incidental left frontal convexity meningioma measuring 17 x 17 x 12 mm without any mass-effect upon the brain. Vascular: Major vessels at the base of the brain show flow. Skull and upper cervical spine: Otherwise negative Sinuses/Orbits: Clear/normal Other: None  IMPRESSION: 1. Numerous punctate foci of abnormal enhancement in the cerebral hemispheres, particularly in the occipital lobes. Larger occipital lobe lesions that are fairly convincingly secondary to subacute infarction. There is some petechial blood product deposition within the larger occipital infarctions. It is not possible to exclude the coexistence of small metastatic lesions at this time, and I would recommend repeat imaging in 6 weeks at which time the smaller lesions should have lost their enhancement if they represent punctate embolic infarctions. It is certainly possible that both processes could be present in this case, thus the need for short-term follow-up. In the meantime, one might consider neurological evaluation for potential etiology of embolic infarctions particularly in the posterior circulation distribution. 2. Incidental 17 x 17 x 12 mm left frontal convexity meningioma without mass effect or edema. Electronically Signed   By: MNelson ChimesM.D.   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 Recent hospitalization for pneumonia. Known effusion. Increased shortness of breath today. EXAM: CHEST - 2 VIEW COMPARISON:  CTA chest 04/11/2022, AP chest 04/17/2022 and 04/13/2022 FINDINGS: Postsurgical changes are again seen of left pneumonectomy. There is again left-sided volume loss and diffuse homogeneous opacification of the left hemithorax. Leftward mediastinal shift. Moderate calcifications are again seen within aortic arch. The cardiac silhouette is not well evaluated. Slight interval increase in right lower lung interstitial thickening compared to 11/11 2023. The aeration in this region is improved from 04/13/2022 frontal radiograph, however. Normal variant  right azygous fissure. Multiple right apical lung nodules are better seen on prior CT. No large right pleural effusion is  seen. No pneumothorax is seen. Moderate multilevel degenerative disc changes of the thoracic spine. IMPRESSION: 1. Slight interval increase in right lower lung interstitial thickening compared to 11/11 2023. Nevertheless, the aeration in this region is improved from 04/13/2022 frontal radiograph. 2. Postsurgical changes of left pneumonectomy with leftward mediastinal shift. 3. Please note that multiple right apical lung nodules described on prior CT 04/11/2022 are better visualized on CT. Electronically Signed   By: Yvonne Kendall M.D.   On: 04/28/2022 12:04   DG CHEST PORT 1 VIEW  Result Date: 04/17/2022 CLINICAL DATA:  Pneumonia.  Follow-up study. EXAM: PORTABLE CHEST 1 VIEW COMPARISON:  04/13/2022.  CT, 04/11/2022. FINDINGS: Left hemithorax remains opacified with volume loss reflected by medial shift to the left. Interstitial and hazy airspace opacities in the right lung have improved. Right lung remains hyperexpanded. No new areas of right lung opacity. No right pleural effusion.  No pneumothorax. IMPRESSION: 1. Interval improvement in right lung aeration with a decrease in interstitial and hazy airspace lung opacities. This is consistent with either improved edema or improved infection. 2. Persistent opacification of the left hemithorax: Patient is status post left pneumonectomy. Electronically Signed   By: Lajean Manes M.D.   On: 04/17/2022 10:25   ECHOCARDIOGRAM COMPLETE  Result Date: 04/13/2022    ECHOCARDIOGRAM REPORT   Patient Name:   Benjamin Case Date of Exam: 04/13/2022 Medical Rec #:  [id number]       Height:       70.0 in Accession #:    [id number]      Weight:       196.6 lb Date of Birth:  04/04/1956       BSA:          2.072 m Patient Age:    8 years        BP:           104/71 mmHg Patient Gender: M               HR:           120 bpm. Exam Location:  Inpatient Procedure: 2D Echo, Cardiac Doppler, Color Doppler and Intracardiac            Opacification Agent Indications:    R94.31  Abnormal EKG  History:        Patient has no prior history of Echocardiogram examinations.                 COPD, Signs/Symptoms:Shortness of Breath and Dyspnea; Risk                 Factors:Current Smoker. Lung cancer. ETOH.  Sonographer:    Roseanna Rainbow RDCS Referring Phys: [id number] [facility]  Sonographer Comments: Technically difficult study due to poor echo windows. Image acquisition challenging due to patient body habitus. IMPRESSIONS  1. Left ventricular ejection fraction, by estimation, is 55 to 60%. The left ventricle has normal function. The left ventricle has no regional wall motion abnormalities. Left ventricular diastolic function could not be evaluated.  2. Right ventricular systolic function is normal. The right ventricular size is severely enlarged. There is moderately elevated pulmonary artery systolic pressure.  3. Right atrial size was severely dilated.  4. The mitral valve is normal in structure. No evidence of mitral valve regurgitation. No evidence of mitral stenosis.  5. Tricuspid valve regurgitation is mild  to moderate.  6. The aortic valve is tricuspid. Aortic valve regurgitation is not visualized. No aortic stenosis is present.  7. The inferior vena cava is dilated in size with >50% respiratory variability, suggesting right atrial pressure of 8 mmHg. FINDINGS  Left Ventricle: Left ventricular ejection fraction, by estimation, is 55 to 60%. The left ventricle has normal function. The left ventricle has no regional wall motion abnormalities. The left ventricular internal cavity size was normal in size. There is  no left ventricular hypertrophy. Left ventricular diastolic function could not be evaluated due to atrial fibrillation. Left ventricular diastolic function could not be evaluated. Right Ventricle: The right ventricular size is severely enlarged. No increase in right ventricular wall thickness. Right ventricular systolic function is normal. There is moderately elevated pulmonary artery  systolic pressure. The tricuspid regurgitant velocity is 3.47 m/s, and with an assumed right atrial pressure of 8 mmHg, the estimated right ventricular systolic pressure is 29.5 mmHg. Left Atrium: Left atrial size was normal in size. Right Atrium: Right atrial size was severely dilated. Pericardium: There is no evidence of pericardial effusion. Mitral Valve: The mitral valve is normal in structure. No evidence of mitral valve regurgitation. No evidence of mitral valve stenosis. Tricuspid Valve: The tricuspid valve is normal in structure. Tricuspid valve regurgitation is mild to moderate. No evidence of tricuspid stenosis. Aortic Valve: The aortic valve is tricuspid. Aortic valve regurgitation is not visualized. No aortic stenosis is present. Pulmonic Valve: The pulmonic valve was normal in structure. Pulmonic valve regurgitation is not visualized. No evidence of pulmonic stenosis. Aorta: The aortic root is normal in size and structure. Venous: The inferior vena cava is dilated in size with greater than 50% respiratory variability, suggesting right atrial pressure of 8 mmHg. IAS/Shunts: No atrial level shunt detected by color flow Doppler.  LEFT VENTRICLE PLAX 2D LVIDd:         4.20 cm LVIDs:         3.00 cm LV PW:         1.10 cm LV IVS:        0.96 cm LVOT diam:     2.40 cm LV SV:         53 LV SV Index:   26 LVOT Area:     4.52 cm  LV Volumes (MOD) LV vol d, MOD A4C: 58.8 ml LV vol s, MOD A4C: 18.5 ml LV SV MOD A4C:     58.8 ml RIGHT VENTRICLE             IVC RV S prime:     12.60 cm/s  IVC diam: 2.10 cm TAPSE (M-mode): 1.9 cm LEFT ATRIUM           Index        RIGHT ATRIUM           Index LA diam:      3.40 cm 1.64 cm/m   RA Area:     31.50 cm LA Vol (A2C): 29.1 ml 14.04 ml/m  RA Volume:   122.00 ml 58.87 ml/m LA Vol (A4C): 48.6 ml 23.45 ml/m  AORTIC VALVE LVOT Vmax:   106.10 cm/s LVOT Vmean:  68.350 cm/s LVOT VTI:    0.118 m  AORTA Ao Root diam: 3.70 cm Ao Asc diam:  3.30 cm MITRAL VALVE                TRICUSPID VALVE MV Area (PHT): 5.01 cm    TR Peak grad:   48.2 mmHg  MV Decel Time: 152 msec    TR Vmax:        347.00 cm/s MV E velocity: 87.55 cm/s                            SHUNTS                            Systemic VTI:  0.12 m                            Systemic Diam: 2.40 cm Dani Gobble Croitoru MD Electronically signed by Sanda Klein MD Signature Date/Time: 04/13/2022/4:06:09 PM    Final    DG Chest Port 1 View  Result Date: 04/13/2022 CLINICAL DATA:  Dyspnea EXAM: PORTABLE CHEST 1 VIEW COMPARISON:  Previous studies including the examination of 04/11/2022 FINDINGS: There is opacification of left hemithorax consistent with left pneumonectomy. Increased interstitial and alveolar markings are seen in right lower lung field with interval worsening. There is improvement in the aeration of right upper lung field. Small right pleural effusion is seen. There is no pneumothorax. IMPRESSION: There is interval worsening of infiltrate in right lower lung fields suggesting worsening pneumonia. There is improvement in the aeration in right upper lung field suggesting decrease in interstitial edema or interstitial pneumonia. Small right pleural effusion. Electronically Signed   By: Elmer Picker M.D.   On: 04/13/2022 10:11   CT Angio Chest PE W and/or Wo Contrast  Result Date: 04/11/2022 CLINICAL DATA:  Pulmonary embolism (PE) suspected, unknown D-dimer Shortness of breath. Radiologic records indicates history of lung cancer, prior pneumonectomy. EXAM: CT ANGIOGRAPHY CHEST WITH CONTRAST TECHNIQUE: Multidetector CT imaging of the chest was performed using the standard protocol during bolus administration of intravenous contrast. Multiplanar CT image reconstructions and MIPs were obtained to evaluate the vascular anatomy. RADIATION DOSE REDUCTION: This exam was performed according to the departmental dose-optimization program which includes automated exposure control, adjustment of the mA and/or kV according to  patient size and/or use of iterative reconstruction technique. CONTRAST:  67m OMNIPAQUE IOHEXOL 350 MG/ML SOLN COMPARISON:  Chest radiograph earlier today. CT 03/17/2022. PET CT 03/12/2022 FINDINGS: Cardiovascular: There are no filling defects within the pulmonary arteries to suggest pulmonary embolus. Chronic central enlargement of the main pulmonary artery. Grossly normal heart size, shifted to the left due to pneumonectomy changes. There are coronary artery calcifications. Atherosclerosis of the thoracic aorta without aneurysm or dissection. Mediastinum/Nodes: Enlarging precarinal node measuring 2.8 x 4.2 cm, series 5, image 52, previously 3.6 x 3 cm. 19 mm subcarinal node series 5, image 65, previously 18 mm. There is a 2 x 4 cm right hilar node series 5, image 66, comparison with prior is limited due to lack contrast on that exam. Low right hilar nodes measure up to 12 mm. Scattered retained debris in the esophagus. Stable volume loss in the left hemithorax post pneumonectomy with mediastinal shift. 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 Small to moderate-sized right pleural effusion is new from prior exam. There are new areas of ground-glass and reticulonodular opacities throughout the right lung, predominantly involving the right upper and middle lobes. Previous right upper lobe cavitary nodule is not well assessed on the current exam due to motion, in the region of series 11, image 44. r medial right apical nodule has increased in size 13 x 9 mm  series 11, image 34, previously 10 x 7 mm. Enlarging right apical nodule at 8 mm series 11, image 27, previously 4 mm. There is narrowing of the right mainstem bronchus with middle and lower lobe bronchial thickening. Occasional areas of mucoid impaction in the lower lobe. Breathing motion artifact limits detailed assessment. Upper Abdomen: Low-density liver lesions in the left lobe is unchanged from recent chest  CT, series 5, image 125 there are multiple additional low-density lesions in the right and left hepatic lobe. These are not definitively seen on recent CT under typical of metastatic disease. Musculoskeletal: Patient with known osseous metastatic disease. Lucency involving the posterior aspect of T3 vertebral body, series 8, image 111. Lucent lesion in the manubrium is again seen. Left lateral ninth rib fracture is new. There are multiple remote left rib fractures and postsurgical change in the left hemithorax. Review of the MIP images confirms the above findings. IMPRESSION: 1. No pulmonary embolus. 2. New areas of ground-glass and reticulonodular opacities throughout the right lung, predominantly involving the right upper and middle lobes. This is likely infectious, including atypical viral organisms. 3. Small to moderate right pleural effusion is new from prior exam. 4. Right hilar adenopathy causes narrowing of the right mainstem bronchus. 5. Enlarging mediastinal adenopathy consistent with worsening nodal disease. 6. Enlarging nodules in the right upper lobe. One of the nodules on prior chest CT is obscured on the current exam due to motion. 7. Left lateral ninth rib fracture is new. Osseous metastatic disease involving the sternum and T3 vertebral body, better demonstrated on prior PET again seen. 8. There are multiple low-density lesions in the liver that were not definitively seen on recent prior, and suspicious for metastatic disease. This is not well assessed on this phase of contrast tailored to pulmonary artery assessment. Aortic Atherosclerosis (ICD10-I70.0). Electronically Signed   By: Keith Rake M.D.   On: 04/11/2022 16:25   DG Chest 2 View  Result Date: 04/11/2022 CLINICAL DATA:  Shortness of breath. History of LEFT pneumonectomy for LEFT lung cancer. EXAM: CHEST - 2 VIEW COMPARISON:  03/17/2022 CT, 06/09/2021 radiograph and prior studies FINDINGS: LEFT pneumonectomy changes again noted.  Interstitial and airspace opacities within the RIGHT lung are now noted. A trace RIGHT pleural effusions present. There is no evidence of pneumothorax. No acute bony abnormalities are identified. IMPRESSION: Interstitial and airspace opacities within the RIGHT lung with trace RIGHT pleural effusion. This may represent pulmonary edema or infection. Electronically Signed   By: Margarette Canada M.D.   On: 04/11/2022 14:23     ASSESSMENT/PLAN:  This is a very pleasant 66 years old Caucasian male with stage IV (T1a, N2, M1 C) non-small cell lung cancer, squamous cell carcinoma. He presented with right upper lobe lung nodule in addition to right hilar and mediastinal lymphadenopathy and metastatic disease to several bone areas as well as musculoskeletal muscles diagnosed in October 2023. The patient also has a history of a stage IIIa non-small cell lung cancer, squamous cell carcinoma diagnosed in June 2009 status post left pneumonectomy followed by 4 cycles of adjuvant systemic chemotherapy completed in April 29, 2008. The patient is positive for K-ras G12 C mutation which could be targeted in the second line setting.    The patient is currently undergoing palliative systemic chemotherapy with carboplatin for an AUC of 5 and paclitaxel 175 mg per metered square, and Keytruda 200 mg IV every 3 weeks with Neulasta support. He is status post 1 cycle of treatment. He was hospitalized  a few days after treatment secondary to hypoxia and pneumonia.  He was found to have new onset atrial fibrillation while in the hospital.  He had intolerance to Eliquis due to black tarry stools and anemia.  The patient's PCP discontinued his Eliquis due to intolerance he denies any abnormal bleeding in his stools at this time.  His hemoglobin is uptrending at 12.5 today compared to 9.6 last week.  On 04/19/2022, the patient had bilateral vision loss for approximately 10 to 15 minutes.  The patient called his cardiologist office and they  commended emergency room evaluation for possible stroke/TIA.  The patient refused to go to the emergency room.  Last week, the patient was seen with [name].  We recommended emergency room evaluation last week due to his shortness of breath but the patient politely refused to go.  He had an EKG last week which he was in normal sinus rhythm.  The patient had CXR. [name] recommended solumedrol and stating a medrol dose pack. He completed this and the tatient states stable baseline dyspnea on exertion today.   Today 05/05/22, due to [name] being out of the office, the patient was seen with Dr. Burr Medico. The patient is asymptomatic today and denies any palpitations, lightheadedness, changes in his baseline shortness of breath, chest pain, or visual disturbances at this time besides his baseline cataracts in his left eye.  Blood pressure is 121/73 today.  His respirations are 16, his oxygen is 97%.  Denies any extremity weakness, speech changes, or facial droop at this time.  However, his pulse was noted to be fluctuating  The patient had his staging brain MRI performed earlier today for his malignancy which noted  numerous punctate foci of abnormal enhancement in the cerebral hemispheres, particularly in the occipital lobes.  The neuroradiologist felt that the larger occipital lobe lesion are convincing for subacute infarction. The radiologist had a challenging time excluding coexisting small metastatic lesions at this time and recommended repeat imaging in 6 weeks.   Additionally, his rate was fluctuating throughout the course of his clinic evaluation today. He had an EKG performed which demonstrated atrial flutter with a ventricular rate was around 135-145 bmp.  This was reviewed with the patient and his wife. Because the patient is unable to tolerate anticoagulation due to presumed GI bleeding with Eliquis and the fact he is presently in atrial flutter, he is at very high risk for additional  strokes.   Due to the constellation of serious risks associated with increased morbidity and mortality associated with these findings, it was our strong recommendation that the patient be evaluated in the emergency department, specifically the [facility] ER due to the complexity of his condition likely requiring input from disciplines such as neurology and cardiology.  Discussed with the patient and his wife that we would be happy to help facilitate that process and call over for bed placement.  The patient is politely refusing emergency room evaluation.  He is concerned about delaying his chemotherapy which is scheduled for tomorrow morning as it was already delayed by 1 week.  Discussed with the patient that we could always reschedule his chemotherapy.  He is still refusing to go to the emergency room.  The patient verbalized understanding with the risks associated with his condition which can cause significant morbidity and mortality.   We have reached out to our colleague, neuro-oncologist [name], regarding the subacute stroke.  [name] will see the patient next week.  I have  placed an urgent referral and sent a scheduling message.  The patient feels that his pulse rate was better controlled with diltiazem.  Per chart review it looks like diltiazem was switched to metoprolol last week due to the concern with hypotension.  The patient's blood pressure is 121/73 today.  He took his metoprolol this morning as prescribed.  The patient would like to try outpatient medication management of his aflutter if possible.  We have reached out to his cardiologist who also agreed emergency room evaluation would be strongly advised.  I relayed to the patient that cardiology stated outpatient  medication management of Aflutter is challenging.  Because the patient has intolerance to blood thinners, he is not a candidate for cardioversion.  The patient acknowledged the risks and still would opt to try with  medication management of his a flutter.  He has an old prescription of diltiazem 60 mg twice daily at home.  He is going to start that tonight and tomorrow morning.  His cardiologist is going to follow-up with him on Friday, 05/07/2022.  This appointment time was relayed to the patient and his wife.  The patient would really like to undergo cycle #2 with chemotherapy tomorrow as scheduled.  Dr. Burr Medico did discuss the increased risk of thromboembolic events with chemotherapy. We will keep his appointment for chemo tomorrow morning as scheduled.  However, if he does not have improvement in his rate control with his atrial flutter or has unstable vitals tomorrow, the patient and his wife understand that we will not be able to treat him with chemotherapy.  I advised the patient and his wife to monitor his vitals in the morning prior to coming to the cancer center.  If his vitals are abnormal, I would recommend having the patient go to the [facility] emergency room.   We reviewed, at length, signs and symptoms that would warrant immediate emergency room evaluation.  Would recommend immediate emergency room evaluation for speech changes, extremity weakness, visual changes, numbness and tingling in the extremities, facial droop, lightheadedness, syncope, shortness of breath, chest pain, diaphoresis, tachycardia, hypoxia, and hypotension. He and his wife verbalized understanding with the instructions.  The patient has the capability to monitor his vitals at home.  The patient voices understanding of current disease status and treatment options and is in agreement with the current care plan. All questions were answered. The patient knows to call the clinic with any problems, questions or concerns. We can certainly see the patient much sooner if necessary          Orders Placed This Encounter  Procedures   Amb Referral to Neuro Oncology    Referral Priority:   Urgent    Referral Type:   Consultation     Referral Reason:   Specialty Services Required    Number of Visits Requested:   1   EKG 12-Lead     Sage Hammill L Shakeena Kafer, PA-C 05/05/22  Addendum I have seen the patient, examined him. I agree with the assessment and and plan and have edited the notes.   I am covering [name] today. Pt came in today to review his recent brain MRI and follow up before C2 chemo. I have reviewed his lab, bran MRI images from yesterday and EKG from today. He has atrial fibration/flutter with RVR but asymptomatic. He brian MRI showed multifocal subacute ischemic stroke, likely embolic from his AF,for which Eliquis was recently stopped due to significant GI bleeding. He had temporary vision loss but  resolved and no other neuro deficits on exam today. He lab and respiratory status otherwise stable. He again refused to go to ED for his rapid AF today, plan to change metoprolol to Cardizem 43m bid, and we reviewed with his cardiologist Dr. CHarrell Gavetoday and she made an urgent appointment for him to be seen in 2 days. Pt wish to continue chemo tomorrow. I again encourage him to monitor VS at home, if he is clinical stable, HR better controlled, will proceed chemo tomorrow. We also made urgent referral to Dr. VMickeal Skinnertoday. All questions were answered. I spent a total of 40 mins for his visit today.  YTruitt MerleMD  05/05/2022

## 2022-05-05 ENCOUNTER — Telehealth: Payer: Self-pay | Admitting: Cardiology

## 2022-05-05 ENCOUNTER — Inpatient Hospital Stay (HOSPITAL_BASED_OUTPATIENT_CLINIC_OR_DEPARTMENT_OTHER): Payer: Medicare Other | Admitting: Physician Assistant

## 2022-05-05 ENCOUNTER — Telehealth: Payer: Self-pay

## 2022-05-05 ENCOUNTER — Other Ambulatory Visit: Payer: Self-pay

## 2022-05-05 ENCOUNTER — Inpatient Hospital Stay: Payer: Medicare Other

## 2022-05-05 VITALS — BP 121/73 | HR 82 | Temp 98.0°F | Resp 16 | Wt 178.0 lb

## 2022-05-05 DIAGNOSIS — C3411 Malignant neoplasm of upper lobe, right bronchus or lung: Secondary | ICD-10-CM | POA: Diagnosis not present

## 2022-05-05 DIAGNOSIS — I480 Paroxysmal atrial fibrillation: Secondary | ICD-10-CM | POA: Diagnosis not present

## 2022-05-05 DIAGNOSIS — I6349 Cerebral infarction due to embolism of other cerebral artery: Secondary | ICD-10-CM

## 2022-05-05 LAB — CBC WITH DIFFERENTIAL (CANCER CENTER ONLY)
Abs Immature Granulocytes: 0.07 10*3/uL (ref 0.00–0.07)
Basophils Absolute: 0.1 10*3/uL (ref 0.0–0.1)
Basophils Relative: 1 %
Eosinophils Absolute: 0.8 10*3/uL — ABNORMAL HIGH (ref 0.0–0.5)
Eosinophils Relative: 5 %
HCT: 36.7 % — ABNORMAL LOW (ref 39.0–52.0)
Hemoglobin: 12.5 g/dL — ABNORMAL LOW (ref 13.0–17.0)
Immature Granulocytes: 0 %
Lymphocytes Relative: 16 %
Lymphs Abs: 2.6 10*3/uL (ref 0.7–4.0)
MCH: 33.5 pg (ref 26.0–34.0)
MCHC: 34.1 g/dL (ref 30.0–36.0)
MCV: 98.4 fL (ref 80.0–100.0)
Monocytes Absolute: 1.6 10*3/uL — ABNORMAL HIGH (ref 0.1–1.0)
Monocytes Relative: 10 %
Neutro Abs: 10.7 10*3/uL — ABNORMAL HIGH (ref 1.7–7.7)
Neutrophils Relative %: 68 %
Platelet Count: 423 10*3/uL — ABNORMAL HIGH (ref 150–400)
RBC: 3.73 MIL/uL — ABNORMAL LOW (ref 4.22–5.81)
RDW: 13.2 % (ref 11.5–15.5)
WBC Count: 15.9 10*3/uL — ABNORMAL HIGH (ref 4.0–10.5)
nRBC: 0 % (ref 0.0–0.2)

## 2022-05-05 LAB — CMP (CANCER CENTER ONLY)
ALT: 56 U/L — ABNORMAL HIGH (ref 0–44)
AST: 28 U/L (ref 15–41)
Albumin: 4 g/dL (ref 3.5–5.0)
Alkaline Phosphatase: 75 U/L (ref 38–126)
Anion gap: 13 (ref 5–15)
BUN: 24 mg/dL — ABNORMAL HIGH (ref 8–23)
CO2: 28 mmol/L (ref 22–32)
Calcium: 8.9 mg/dL (ref 8.9–10.3)
Chloride: 94 mmol/L — ABNORMAL LOW (ref 98–111)
Creatinine: 1.31 mg/dL — ABNORMAL HIGH (ref 0.61–1.24)
GFR, Estimated: 59 mL/min — ABNORMAL LOW (ref >60)
Glucose, Bld: 108 mg/dL — ABNORMAL HIGH (ref 70–99)
Potassium: 4.5 mmol/L (ref 3.5–5.1)
Sodium: 135 mmol/L (ref 135–145)
Total Bilirubin: 0.6 mg/dL (ref 0.3–1.2)
Total Protein: 7.9 g/dL (ref 6.5–8.1)

## 2022-05-05 MED FILL — Fosaprepitant Dimeglumine For IV Infusion 150 MG (Base Eq): INTRAVENOUS | Qty: 5 | Status: AC

## 2022-05-05 MED FILL — Dexamethasone Sodium Phosphate Inj 100 MG/10ML: INTRAMUSCULAR | Qty: 1 | Status: AC

## 2022-05-05 NOTE — Telephone Encounter (Signed)
Pt in their office pt refusing to go to ED regarding Afib. She would like a callback as soon as possible. Please advise

## 2022-05-05 NOTE — Telephone Encounter (Signed)
This nurse reached out to this patient.  Spoke with his wife.   Made her aware that the cardiologist, Dr. Harrell Gave, will be able to see the patient on Friday 12/1 at 220pm.  She acknowledged understanding.  No further questions or concerns at this time.

## 2022-05-05 NOTE — Telephone Encounter (Signed)
Dr Harrell Gave communicated via secure chat and agreed patient needed to go to ED for evaluation.  Patient continued to refuse so Dr Harrell Gave recommendations below   He can try the cardizem for heart rate, but flutter is often difficult to manage with oral medications alone. Because he cannot tolerate a blood thinner, he is not a candidate for cardioversion, so we are a little stuck. I know he very much does not want to go to the ER. I would try the diltiazem but if his blood pressure can't tolerate this or he feels worse, the ER is the best option for him  I will ask my team to schedule him, but this is going to be very difficult to control with medication only, unfortunately. We've instructed him several times to seek urgent care in the ER, and especially with a recent stroke this would be my guidance, but if he is absolutely unwilling to go to the ER then we will do the best we can with medications.   Patient scheduled for visit with Dr Harrell Gave 12/1, PA at oncology was to let patient know before leaving office

## 2022-05-06 ENCOUNTER — Other Ambulatory Visit: Payer: Self-pay | Admitting: Physician Assistant

## 2022-05-06 ENCOUNTER — Inpatient Hospital Stay: Payer: Medicare Other

## 2022-05-06 ENCOUNTER — Other Ambulatory Visit: Payer: Medicare Other

## 2022-05-06 ENCOUNTER — Ambulatory Visit: Payer: Medicare Other

## 2022-05-06 VITALS — BP 104/63 | HR 81 | Temp 98.2°F | Resp 18

## 2022-05-06 DIAGNOSIS — C3411 Malignant neoplasm of upper lobe, right bronchus or lung: Secondary | ICD-10-CM | POA: Diagnosis not present

## 2022-05-06 DIAGNOSIS — C3491 Malignant neoplasm of unspecified part of right bronchus or lung: Secondary | ICD-10-CM

## 2022-05-06 MED ORDER — SODIUM CHLORIDE 0.9 % IV SOLN: Freq: Once | INTRAVENOUS | Status: AC

## 2022-05-06 MED ORDER — CETIRIZINE HCL 10 MG/ML IV SOLN
10.0000 mg | Freq: Once | INTRAVENOUS | Status: AC
  Administered 2022-05-06: 10 mg via INTRAVENOUS
  Filled 2022-05-06: qty 1

## 2022-05-06 MED ORDER — SODIUM CHLORIDE 0.9 % IV SOLN
200.0000 mg | Freq: Once | INTRAVENOUS | Status: AC
  Administered 2022-05-06: 200 mg via INTRAVENOUS
  Filled 2022-05-06: qty 200

## 2022-05-06 MED ORDER — SODIUM CHLORIDE 0.9 % IV SOLN
430.0000 mg | Freq: Once | INTRAVENOUS | Status: AC
  Administered 2022-05-06: 430 mg via INTRAVENOUS
  Filled 2022-05-06: qty 43

## 2022-05-06 MED ORDER — LIDOCAINE-PRILOCAINE 2.5-2.5 % EX CREA: 1.0000 | TOPICAL_CREAM | CUTANEOUS | 2 refills | Status: AC | PRN

## 2022-05-06 MED ORDER — FAMOTIDINE IN NACL 20-0.9 MG/50ML-% IV SOLN
20.0000 mg | Freq: Once | INTRAVENOUS | Status: AC
  Administered 2022-05-06: 20 mg via INTRAVENOUS
  Filled 2022-05-06: qty 50

## 2022-05-06 MED ORDER — SODIUM CHLORIDE 0.9 % IV SOLN
175.0000 mg/m2 | Freq: Once | INTRAVENOUS | Status: AC
  Administered 2022-05-06: 348 mg via INTRAVENOUS
  Filled 2022-05-06: qty 58

## 2022-05-06 MED ORDER — PALONOSETRON HCL INJECTION 0.25 MG/5ML
0.2500 mg | Freq: Once | INTRAVENOUS | Status: AC
  Administered 2022-05-06: 0.25 mg via INTRAVENOUS
  Filled 2022-05-06: qty 5

## 2022-05-06 MED ORDER — SODIUM CHLORIDE 0.9 % IV SOLN
150.0000 mg | Freq: Once | INTRAVENOUS | Status: AC
  Administered 2022-05-06: 150 mg via INTRAVENOUS
  Filled 2022-05-06: qty 150
  Filled 2022-05-06: qty 5

## 2022-05-06 MED ORDER — SODIUM CHLORIDE 0.9 % IV SOLN
10.0000 mg | Freq: Once | INTRAVENOUS | Status: AC
  Administered 2022-05-06: 10 mg via INTRAVENOUS
  Filled 2022-05-06: qty 10
  Filled 2022-05-06: qty 1

## 2022-05-06 NOTE — Progress Notes (Signed)
Cardiology Office Note:    Date:  05/07/2022   ID:  Benjamin Case, DOB [birth year], MRN [id number]  PCP:  [name], MD  Cardiologist:  Buford Dresser, MD  Referring MD: [name], MD    History of Present Illness:    Benjamin Case is a 66 y.o. 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 I initially met him in the hospital 04/15/2022, which was also the initial diagnosis of his atrial fibrillation. He was treated for multifocal pneumonia and sepsis, required home O2 at discharge.  He followed up with Laurann Montana, NP on 04/27/2022  Today: We had an extensive discussion regarding his atrial fibrillation today. His wife is also present for all discussions.   In summary, we discussed that his brain MRI is suggestive of embolic stroke. With known atrial fibrillation and stopping anticoagulation, it is very concerning that the heart may be the source of clot that embolized. His wife notes that he had very dark stools about 5 days after starting apixaban. We discussed this, options for anticoagulation including coumadin, risk of bleeding/risk of stroke with these options. I emphasized that having had an embolic stroke makes him very high risk to have another.   I also extensively discussed that we cannot cardiovert or attempt rhythm restoration without anticoagulation. Discussed the risk of embolism with this.  I discussed afib at length, that he is likely in it all the time even when his heart rate is slower. Reviewed his home logs of BP/HR. He had systolic blood pressures <270 on diltiazem 360 mg daily, so he changed to diltiazem ER 60 mg BID. However, they noted that he continued to have elevated heart rates on this. He was changed to metoprolol succinate 25 mg daily after his visit  with Laurann Montana on 04/27/22. Based on logs, appears that this improved his heart rate without as much lowering of blood pressure. Two days ago, after seeing oncology, they went back to the diltiazem 60 mg BID, reports no differences.  Patient is adamant that he does not want hospital admission or ER evaluation. I discussed GI evaluation given his high risk of stroke and GI bleeding on apixaban. He is concerned that he may remain hospitalized for days after a potential endoscopy. He would prefer to avoid procedures if possible but will think about this.  He notes that he doesn't feel differently whether his heart rate is fast or slow. Had some lightheadedness when his BP was low. Doesn't feel that a change needs to be made today. Wife is very concerned about him, has many questions which we discussed in detail today.  Past Medical History:  Diagnosis Date   Arthritis    back   COPD (chronic obstructive pulmonary disease) ([place])    Diabetes mellitus without complication (HCC)    no medications , monitors cbg at home with monitor, avg no more than 150s at home    Dyspnea    increased exertion; pt states can climb flight of stairs w/o having to stop prior to reaching top ; ongoing reports it is due to "having one lung"    GERD (gastroesophageal reflux disease)    Hepatitis    hepatitis C; pt states competed treatments, unsure what medication he was treated with    History of chemotherapy    Hypertension  reports its been over a year since he stopped taking bp medication, reports he made her medical doctor aware that he was topping it    lung ca dx'd 12/2007   lt lobectomy   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   and back - see note in [facility] 12/26/21   Numbness    FEET BILATERAL   Pneumonia 2009   Right upper lobe pulmonary nodule 04/29/2021   Rotator cuff tear    right     Past Surgical History:  Procedure Laterality Date   ARTHROSCOPIC REPAIR ACL Right    BRONCHIAL BIOPSY  06/09/2021    Procedure: BRONCHIAL BIOPSIES;  Surgeon: Garner Nash, DO;  Location: [facility];  Service: Pulmonary;;   BRONCHIAL BRUSHINGS  06/09/2021   Procedure: BRONCHIAL BRUSHINGS;  Surgeon: Garner Nash, DO;  Location: [facility];  Service: Pulmonary;;   BRONCHIAL WASHINGS  06/09/2021   Procedure: BRONCHIAL WASHINGS;  Surgeon: Garner Nash, DO;  Location: [facility];  Service: Pulmonary;;   CATARACT EXTRACTION, BILATERAL  2019   DECOMPRESSIVE LUMBAR LAMINECTOMY LEVEL 1 N/A 03/24/2016   Procedure: L3-4 CENTRAL DECOMPRESSION LUMBER LAMINECTOMY;  Surgeon: Latanya Maudlin, MD;  Location: [facility];  Service: Orthopedics;  Laterality: N/A;   FINE NEEDLE ASPIRATION  03/23/2022   Procedure: FINE NEEDLE ASPIRATION (FNA) LINEAR;  Surgeon: Garner Nash, DO;  Location: [facility];  Service: Pulmonary;;   FOOT SURGERY Bilateral    HAND SURGERY Bilateral    LOBECTOMY Left 2009   pneumonectomy Left 2009   [name]   SHOULDER ARTHROSCOPY WITH DISTAL CLAVICLE RESECTION Right 12/14/2018   Procedure: SHOULDER ARTHROSCOPY WITH DISTAL CLAVICLE RESECTION;  Surgeon: Tania Ade, MD;  Location: [facility];  Service: Orthopedics;  Laterality: Right;   SHOULDER ARTHROSCOPY WITH ROTATOR CUFF REPAIR Right 12/14/2018   Procedure: SHOULDER ARTHROSCOPY WITH ROTATOR CUFF REPAIR;  Surgeon: Tania Ade, MD;  Location: [facility];  Service: Orthopedics;  Laterality: Right;   SHOULDER ARTHROSCOPY WITH SUBACROMIAL DECOMPRESSION Right 12/14/2018   Procedure: SHOULDER ARTHROSCOPY WITH SUBACROMIAL DECOMPRESSION;  Surgeon: Tania Ade, MD;  Location: [facility];  Service: Orthopedics;  Laterality: Right;   TRIGGER FINGER RELEASE     right hand, pointer finger    VIDEO BRONCHOSCOPY WITH ENDOBRONCHIAL ULTRASOUND Right 03/23/2022   Procedure: VIDEO BRONCHOSCOPY WITH ENDOBRONCHIAL ULTRASOUND;  Surgeon: Garner Nash, DO;  Location: [facility];  Service: Pulmonary;  Laterality: Right;   VIDEO BRONCHOSCOPY WITH  RADIAL ENDOBRONCHIAL ULTRASOUND  06/09/2021   Procedure: VIDEO BRONCHOSCOPY WITH RADIAL ENDOBRONCHIAL ULTRASOUND;  Surgeon: Garner Nash, DO;  Location: [facility];  Service: Pulmonary;;    Current Medications: Current Outpatient Medications on File Prior to Visit  Medication Sig   ALPRAZolam (XANAX) 1 MG tablet Take 1 mg by mouth 3 (three) times daily as needed for anxiety.   Ascorbic Acid (VITAMIN C) 1000 MG tablet Take 1,000 mg by mouth daily.   aspirin EC 81 MG tablet Take 81 mg by mouth daily.   Aspirin-Caffeine (BC FAST PAIN RELIEF) 845-65 MG PACK Take 1 packet by mouth daily as needed (pain.).   atorvastatin (LIPITOR) 10 MG tablet Take 10 mg by mouth at bedtime.    B Complex-C (SUPER B COMPLEX PO) Take 1 tablet by mouth daily.   diltiazem (CARDIZEM SR) 60 MG 12 hr capsule Take 60 mg by mouth 2 (two) times daily.   esomeprazole (NEXIUM) 40 MG capsule Take 40 mg by mouth daily as needed (for heartburn).    folic acid (FOLVITE) 1  MG tablet Take 1 tablet (1 mg total) by mouth daily.   guaiFENesin (MUCINEX) 600 MG 12 hr tablet Take 600 mg by mouth 2 (two) times daily.   ipratropium-albuterol (DUONEB) 0.5-2.5 (3) MG/3ML SOLN Take 3 mLs by nebulization 2 (two) times daily.   lidocaine-prilocaine (EMLA) cream Apply 1 Application topically as needed.   meloxicam (MOBIC) 15 MG tablet Take 15 mg by mouth daily.   methocarbamol (ROBAXIN) 500 MG tablet Take 500 mg by mouth 2 (two) times daily as needed for muscle spasms.   methylPREDNISolone (MEDROL DOSEPAK) 4 MG TBPK tablet Use as instructed   metoprolol succinate (TOPROL XL) 25 MG 24 hr tablet Take 1 tablet (25 mg total) by mouth daily.   Omega-3 Fatty Acids ([employer] FISH OIL) 1000 MG CAPS Take 1,000 mg by mouth every evening.   oxyCODONE-acetaminophen (PERCOCET) 10-325 MG tablet Take 1 tablet by mouth 5 (five) times daily as needed for pain.   pregabalin (LYRICA) 75 MG capsule Take 75 mg by mouth 2 (two) times daily.   prochlorperazine  (COMPAZINE) 10 MG tablet Take 1 tablet (10 mg total) by mouth every 6 (six) hours as needed for nausea or vomiting.   thiamine (VITAMIN B-1) 100 MG tablet Take 1 tablet (100 mg total) by mouth daily.   thiamine (VITAMIN B1) 100 MG tablet Take 100 mg by mouth daily.   vitamin B-12 (CYANOCOBALAMIN) 1000 MCG tablet Take 1,000 mcg by mouth daily.   No current facility-administered medications on file prior to visit.     Allergies:   Patient has no known allergies.   Social History   Tobacco Use   Smoking status: Every Day    Packs/day: 0.50    Years: 45.00    Total pack years: 22.50    Types: Cigarettes   Smokeless tobacco: Never   Tobacco comments:    08/04/16 3-6 cigs daily 02/22/2022 [place]    1/2 -3/4 ppd as of 03/22/22  Vaping Use   Vaping Use: Never used  Substance Use Topics   Alcohol use: Yes    Comment: whiskey 4 drinks/day   Drug use: No    Family History: family history includes Cancer in his mother.  ROS:   Please see the history of present illness.    EKGs/Labs/Other Studies Reviewed:    The following studies were reviewed today: MRI brain 05/04/22 FINDINGS: Brain: The brainstem is normal. Single punctate focus of restricted diffusion in the posterior right cerebellum favored to represent an acute/subacute cerebellar infarction. There are a few old small vessel cerebellar infarctions but no evidence of enhancing lesion to indicate posterior fossa metastatic disease. Within the cerebral hemispheres, there is a constellation of findings that I think indicate subacute infarctions within both hemispheres, particularly in the occipital lobes but with other scattered punctate foci in the frontal and parietal regions left more than right. It is not possible to exclude the coexistence of small metastatic lesions at this time, and I would recommend repeat imaging in 6 weeks at which time the smaller lesions should have lost their enhancement if they represent punctate  embolic infarctions. It is certainly possible that both processes could be present in this case. There is some petechial blood product deposition within the larger occipital infarctions. None of the lesions are associated with vasogenic edema or mass effect. No hydrocephalus or extra-axial collection. There is an incidental left frontal convexity meningioma measuring 17 x 17 x 12 mm without any mass-effect upon the brain.   Vascular: Major vessels  at the base of the brain show flow.   Skull and upper cervical spine: Otherwise negative   Sinuses/Orbits: Clear/normal   Other: None   IMPRESSION: 1. Numerous punctate foci of abnormal enhancement in the cerebral hemispheres, particularly in the occipital lobes. Larger occipital lobe lesions that are fairly convincingly secondary to subacute infarction. There is some petechial blood product deposition within the larger occipital infarctions. It is not possible to exclude the coexistence of small metastatic lesions at this time, and I would recommend repeat imaging in 6 weeks at which time the smaller lesions should have lost their enhancement if they represent punctate embolic infarctions. It is certainly possible that both processes could be present in this case, thus the need for short-term follow-up. In the meantime, one might consider neurological evaluation for potential etiology of embolic infarctions particularly in the posterior circulation distribution. 2. Incidental 17 x 17 x 12 mm left frontal convexity meningioma without mass effect or edema.  CTA Chest  04/11/2022: IMPRESSION: 1. No pulmonary embolus. 2. New areas of ground-glass and reticulonodular opacities throughout the right lung, predominantly involving the right upper and middle lobes. This is likely infectious, including atypical viral organisms. 3. Small to moderate right pleural effusion is new from prior exam. 4. Right hilar adenopathy causes narrowing of the  right mainstem bronchus. 5. Enlarging mediastinal adenopathy consistent with worsening nodal disease. 6. Enlarging nodules in the right upper lobe. One of the nodules on prior chest CT is obscured on the current exam due to motion. 7. Left lateral ninth rib fracture is new. Osseous metastatic disease involving the sternum and T3 vertebral body, better demonstrated on prior PET again seen. 8. There are multiple low-density lesions in the liver that were not definitively seen on recent prior, and suspicious for metastatic disease. This is not well assessed on this phase of contrast tailored to pulmonary artery assessment.   Aortic Atherosclerosis (ICD10-I70.0).  EKG:  EKG is personally reviewed.   05/07/2022:  not ordered today 05/05/22: atrial flutter at 145 bpm 04/28/22: NSR at 82 bpm  Recent Labs: 04/08/2022: TSH 0.447 04/13/2022: B Natriuretic Peptide 525.5 04/15/2022: Magnesium 1.8 05/05/2022: ALT 56; BUN 24; Creatinine 1.31; Hemoglobin 12.5; Platelet Count 423; Potassium 4.5; Sodium 135   Recent Lipid Panel No results found for: "CHOL", "TRIG", "HDL", "CHOLHDL", "VLDL", "LDLCALC", "LDLDIRECT"  Physical Exam:    VS:  BP 112/60 Comment: home  Pulse (!) 103   Ht _0  (1.778 m)   Wt 173 lb 6.4 oz (78.7 kg)   SpO2 92%   BMI 24.88 kg/m     Wt Readings from Last 3 Encounters:  05/07/22 173 lb 6.4 oz (78.7 kg)  05/05/22 178 lb (80.7 kg)  04/28/22 176 lb 11.2 oz (80.2 kg)    GEN: In no acute distress, [place] in place HEENT: Normal, moist mucous membranes NECK: No JVD appreciated CARDIAC: irregularly irregular rhythm, normal S1 and S2, no rubs or gallops. No murmur. VASCULAR: Radial and DP pulses 2+ bilaterally. No carotid bruits RESPIRATORY:  no significant breath sounds on L, coarse breath sounds on R ABDOMEN: Soft, non-tender, non-distended MUSCULOSKELETAL:  Ambulates independently SKIN: Warm and dry, no edema NEUROLOGIC:  Alert and oriented x 3. No focal neuro deficits  noted. PSYCHIATRIC:  Normal affect    ASSESSMENT:    1. Paroxysmal atrial fibrillation (HCC)   2. History of embolic stroke   3. Metastatic primary lung cancer, right ([place])   4. History of GI bleed   5. Typical atrial  flutter ([place])    PLAN:    Paroxysmal atrial fibrillation Atrial flutter Imaging evidence of recent embolic stroke/cannot exclude metastatic disease History of GI bleed while on apixaban -CHA2DS2/VAS Stroke Risk Points= 4  -had evidence of GI bleed on apixaban. Has seen Dr. Watt Climes in the past at [place] [place]. We discussed GI evaluation, he will consider  -I discussed with the patient and his wife for nearly an hour today that this will be difficult to manage. I suspect with his lung disease he will be prone to both atrial fibrillation/atrial flutter. Given that he is not taking anticoagulation, we can pursue only a rate control strategy at this time. Discussed that this is often difficult. Discussed that diltiazem typically lowers BP more than metoprolol, but he feels that possibly the diltiazem controlled his HR better -after shared decision making, will continue diltiazem 60 mg BID and add mid day dose of metoprolol succinate 25 mg -patient is not interested in restarting anticoagulation at this time. Wife asked what would be needed to do this. He would need to be cleared for anticoagulation from a neuro standpoint given brain MRI findings first. We could then consider rechallenging with apixaban first vs. Referring to GI for endoscopy prior to starting. If he is clear from both a neuro and GI standpoint, and his blood counts are stable with chemotherapy, then we could use anticoagulation. I would strongly recommend DOAC over coumadin.  -they will consider this, but in the meantime they will trial medication recommendations as above. They will continue to check home blood pressures, and they will contact me with any issues or concerns -we also discussed that antiplatelet agents,  namely aspirin and clopidogrel, do not lower stroke risk due to afib. Given this, does not need aspirin from cardiac perspective given comorbidities. 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 lung/R lymph nodes/bone/MSK -remains on home O2, recently hospitalized with multifocal pneumonia -undergoing chemotherapy -s/p prior L pneumonectomy in 2009 on initial diagnosis of stage 3 NSCLC  Severely enlarged RA/RV Enlargement of PA on CT -suggests pulmonary hypertension -he denies being told he has pulmonary hypertension in the past -no significant peripheral edema on exam, discussed what to watch for -may need PRN diuretic in the future, need to be cautious with his sodium/Cr findings   Coronary calcification Aortic atherosclerosis -low priority currently in the setting of multiple medical comorbidities, above -continue atorvastatin  Plan for follow up: 4-5 weeks or sooner as needed.  Total time of encounter: 88 minutes total time of encounter, including 54 minutes spent in face-to-face patient care. This time includes coordination of care and counseling regarding above conditions. Remainder of non-face-to-face time involved reviewing chart documents/testing relevant to the patient encounter and documentation in the medical record.  Buford Dresser, MD, PhD, [facility]    Medication Adjustments/Labs and Tests Ordered: Current medicines are reviewed at length with the patient today.  Concerns regarding medicines are outlined above.   No orders of the defined types were placed in this encounter.  No orders of the defined types were placed in this encounter.  Patient Instructions  Medication Instructions:  Your physician has recommended you make the following change in your medication:   We are going to try to find a happy medium with your  heart rate. Goal ideally is 70s-80s heart rate at rest, <120 heart rate with walking. I'd  like your blood pressure to not be less than 100/60.  Take diltiazem extended release 60 mg in the AM Take metoprolol succinate 25 mg mid day Take diltiazem extended release 60 in the PM  If you have low blood pressures, lightheadedness/feeling like you are going to pass out, go back to the diltiazem alone twice a day and let me know.  *If you need a refill on your cardiac medications before your next appointment, please call your pharmacy*  Follow-Up: At [facility], you and your health needs are our priority.  As part of our continuing mission to provide you with exceptional heart care, we have created designated Provider Care Teams.  These Care Teams include your primary Cardiologist (physician) and Advanced Practice Providers (APPs -  Physician Assistants and Nurse Practitioners) who all work together to provide you with the care you need, when you need it.  We recommend signing up for the patient portal called "MyChart".  Sign up information is provided on this After Visit Summary.  MyChart is used to connect with patients for Virtual Visits (Telemedicine).  Patients are able to view lab/test results, encounter notes, upcoming appointments, etc.  Non-urgent messages can be sent to your provider as well.   To learn more about what you can do with MyChart, go to [email or web address].    Your next appointment:   2nd week of January with Dr. Harrell Gave     Signed, Buford Dresser, MD PhD 05/07/2022 6:39 PM    [place]

## 2022-05-06 NOTE — Progress Notes (Signed)
I saw him in the infusion room today. He appears to be in NSR today. He feels well. His BP is a little low. We will arrange 500 ccs of fluid to run with his treatment today. He is scheduled to see cardiology tomorrow and neurology next week.

## 2022-05-06 NOTE — Patient Instructions (Signed)
[facility]  Discharge Instructions: Thank you for choosing [facility] to provide your oncology and hematology care.   If you have a lab appointment with the [facility], please go directly to the [facility] and check in at the registration area.   Wear comfortable clothing and clothing appropriate for easy access to any Portacath or PICC line.   We strive to give you quality time with your provider. You may need to reschedule your appointment if you arrive late (15 or more minutes).  Arriving late affects you and other patients whose appointments are after yours.  Also, if you miss three or more appointments without notifying the office, you may be dismissed from the clinic at the provider's discretion.      For prescription refill requests, have your pharmacy contact our office and allow 72 hours for refills to be completed.    Today you received the following chemotherapy and/or immunotherapy agents Keytruda, taxol, carboplatin      To help prevent nausea and vomiting after your treatment, we encourage you to take your nausea medication as directed.  BELOW ARE SYMPTOMS THAT SHOULD BE REPORTED IMMEDIATELY: *FEVER GREATER THAN 100.4 F (38 C) OR HIGHER *CHILLS OR SWEATING *NAUSEA AND VOMITING THAT IS NOT CONTROLLED WITH YOUR NAUSEA MEDICATION *UNUSUAL SHORTNESS OF BREATH *UNUSUAL BRUISING OR BLEEDING *URINARY PROBLEMS (pain or burning when urinating, or frequent urination) *BOWEL PROBLEMS (unusual diarrhea, constipation, pain near the anus) TENDERNESS IN MOUTH AND THROAT WITH OR WITHOUT PRESENCE OF ULCERS (sore throat, sores in mouth, or a toothache) UNUSUAL RASH, SWELLING OR PAIN  UNUSUAL VAGINAL DISCHARGE OR ITCHING   Items with * indicate a potential emergency and should be followed up as soon as possible or go to the Emergency Department if any problems should occur.  Please show the CHEMOTHERAPY ALERT CARD or IMMUNOTHERAPY ALERT  CARD at check-in to the Emergency Department and triage nurse.  Should you have questions after your visit or need to cancel or reschedule your appointment, please contact [facility]  Dept: [phone]  and follow the prompts.  Office hours are 8:00 a.m. to 4:30 p.m. Monday - Friday. Please note that voicemails left after 4:00 p.m. may not be returned until the following business day.  We are closed weekends and major holidays. You have access to a nurse at all times for urgent questions. Please call the main number to the clinic Dept: [phone] and follow the prompts.   For any non-urgent questions, you may also contact your provider using MyChart. We now offer e-Visits for anyone 24 and older to request care online for non-urgent symptoms. For details visit [email or web address].   Also download the MyChart app! Go to the app store, search "MyChart", open the app, select [facility], and log in with your MyChart username and password.  Masks are optional in the cancer centers. If you would like for your care team to wear a mask while they are taking care of you, please let them know. You may have one support person who is at least 68 years old accompany you for your appointments.

## 2022-05-06 NOTE — Progress Notes (Signed)
Ok to decrease carboplatin to 430mg  based off of yesterday SCr=1.3 per [place], [place]

## 2022-05-07 ENCOUNTER — Ambulatory Visit (INDEPENDENT_AMBULATORY_CARE_PROVIDER_SITE_OTHER): Payer: Medicare Other | Admitting: Cardiology

## 2022-05-07 ENCOUNTER — Encounter (HOSPITAL_BASED_OUTPATIENT_CLINIC_OR_DEPARTMENT_OTHER): Payer: Self-pay | Admitting: Cardiology

## 2022-05-07 VITALS — BP 112/60 | HR 103 | Ht 70.0 in | Wt 173.4 lb

## 2022-05-07 DIAGNOSIS — Z8719 Personal history of other diseases of the digestive system: Secondary | ICD-10-CM | POA: Diagnosis not present

## 2022-05-07 DIAGNOSIS — I6349 Cerebral infarction due to embolism of other cerebral artery: Secondary | ICD-10-CM | POA: Diagnosis not present

## 2022-05-07 DIAGNOSIS — C3491 Malignant neoplasm of unspecified part of right bronchus or lung: Secondary | ICD-10-CM

## 2022-05-07 DIAGNOSIS — I4819 Other persistent atrial fibrillation: Secondary | ICD-10-CM | POA: Insufficient documentation

## 2022-05-07 DIAGNOSIS — Z8673 Personal history of transient ischemic attack (TIA), and cerebral infarction without residual deficits: Secondary | ICD-10-CM

## 2022-05-07 DIAGNOSIS — I480 Paroxysmal atrial fibrillation: Secondary | ICD-10-CM | POA: Diagnosis not present

## 2022-05-07 DIAGNOSIS — I483 Typical atrial flutter: Secondary | ICD-10-CM

## 2022-05-07 NOTE — Patient Instructions (Addendum)
Medication Instructions:  Your physician has recommended you make the following change in your medication:   We are going to try to find a happy medium with your heart rate. Goal ideally is 70s-80s heart rate at rest, <120 heart rate with walking. I'd like your blood pressure to not be less than 100/60.  Take diltiazem extended release 60 mg in the AM Take metoprolol succinate 25 mg mid day Take diltiazem extended release 60 in the PM  If you have low blood pressures, lightheadedness/feeling like you are going to pass out, go back to the diltiazem alone twice a day and let me know.  *If you need a refill on your cardiac medications before your next appointment, please call your pharmacy*  Follow-Up: At [facility], you and your health needs are our priority.  As part of our continuing mission to provide you with exceptional heart care, we have created designated Provider Care Teams.  These Care Teams include your primary Cardiologist (physician) and Advanced Practice Providers (APPs -  Physician Assistants and Nurse Practitioners) who all work together to provide you with the care you need, when you need it.  We recommend signing up for the patient portal called "MyChart".  Sign up information is provided on this After Visit Summary.  MyChart is used to connect with patients for Virtual Visits (Telemedicine).  Patients are able to view lab/test results, encounter notes, upcoming appointments, etc.  Non-urgent messages can be sent to your provider as well.   To learn more about what you can do with MyChart, go to [email or web address].    Your next appointment:   2nd week of January with Dr. Harrell Gave

## 2022-05-08 ENCOUNTER — Inpatient Hospital Stay: Payer: Medicare Other | Attending: Internal Medicine

## 2022-05-08 VITALS — BP 127/68 | HR 109 | Temp 98.1°F | Resp 19

## 2022-05-08 DIAGNOSIS — R0602 Shortness of breath: Secondary | ICD-10-CM | POA: Diagnosis not present

## 2022-05-08 DIAGNOSIS — C3411 Malignant neoplasm of upper lobe, right bronchus or lung: Secondary | ICD-10-CM

## 2022-05-08 DIAGNOSIS — I110 Hypertensive heart disease with heart failure: Secondary | ICD-10-CM | POA: Diagnosis not present

## 2022-05-08 MED ORDER — PEGFILGRASTIM-CBQV 6 MG/0.6ML ~~LOC~~ SOSY
6.0000 mg | PREFILLED_SYRINGE | Freq: Once | SUBCUTANEOUS | Status: AC
  Administered 2022-05-08: 6 mg via SUBCUTANEOUS
  Filled 2022-05-08: qty 0.6

## 2022-05-08 NOTE — Patient Instructions (Signed)

## 2022-05-10 ENCOUNTER — Telehealth: Payer: Self-pay

## 2022-05-10 ENCOUNTER — Inpatient Hospital Stay (HOSPITAL_BASED_OUTPATIENT_CLINIC_OR_DEPARTMENT_OTHER): Payer: Medicare Other | Admitting: Physician Assistant

## 2022-05-10 ENCOUNTER — Ambulatory Visit (HOSPITAL_COMMUNITY)
Admission: RE | Admit: 2022-05-10 | Discharge: 2022-05-10 | Disposition: A | Discharge status: Home / Self Care | Payer: Medicare Other | Source: Ambulatory Visit | Attending: Physician Assistant | Admitting: Physician Assistant

## 2022-05-10 ENCOUNTER — Inpatient Hospital Stay (HOSPITAL_BASED_OUTPATIENT_CLINIC_OR_DEPARTMENT_OTHER): Payer: Medicare Other | Admitting: Internal Medicine

## 2022-05-10 ENCOUNTER — Emergency Department (HOSPITAL_COMMUNITY): Payer: Medicare Other

## 2022-05-10 ENCOUNTER — Other Ambulatory Visit: Payer: Self-pay

## 2022-05-10 ENCOUNTER — Inpatient Hospital Stay (HOSPITAL_COMMUNITY)
Admission: EM | Admit: 2022-05-10 | Discharge: 2022-05-14 | DRG: 291 | Disposition: A | Discharge status: Home / Self Care | Payer: Medicare Other | Attending: Internal Medicine | Admitting: Internal Medicine

## 2022-05-10 ENCOUNTER — Other Ambulatory Visit: Payer: Self-pay | Admitting: Physician Assistant

## 2022-05-10 VITALS — BP 104/64 | HR 110 | Temp 99.0°F | Resp 17 | Wt 173.4 lb

## 2022-05-10 DIAGNOSIS — Z809 Family history of malignant neoplasm, unspecified: Secondary | ICD-10-CM

## 2022-05-10 DIAGNOSIS — F17210 Nicotine dependence, cigarettes, uncomplicated: Secondary | ICD-10-CM | POA: Diagnosis present

## 2022-05-10 DIAGNOSIS — J189 Pneumonia, unspecified organism: Secondary | ICD-10-CM | POA: Diagnosis present

## 2022-05-10 DIAGNOSIS — Z8673 Personal history of transient ischemic attack (TIA), and cerebral infarction without residual deficits: Secondary | ICD-10-CM

## 2022-05-10 DIAGNOSIS — I63433 Cerebral infarction due to embolism of bilateral posterior cerebral arteries: Secondary | ICD-10-CM | POA: Diagnosis not present

## 2022-05-10 DIAGNOSIS — I110 Hypertensive heart disease with heart failure: Principal | ICD-10-CM | POA: Diagnosis present

## 2022-05-10 DIAGNOSIS — C786 Secondary malignant neoplasm of retroperitoneum and peritoneum: Secondary | ICD-10-CM | POA: Diagnosis present

## 2022-05-10 DIAGNOSIS — Z79899 Other long term (current) drug therapy: Secondary | ICD-10-CM | POA: Diagnosis not present

## 2022-05-10 DIAGNOSIS — C3491 Malignant neoplasm of unspecified part of right bronchus or lung: Secondary | ICD-10-CM

## 2022-05-10 DIAGNOSIS — Z7902 Long term (current) use of antithrombotics/antiplatelets: Secondary | ICD-10-CM

## 2022-05-10 DIAGNOSIS — Z7982 Long term (current) use of aspirin: Secondary | ICD-10-CM | POA: Diagnosis not present

## 2022-05-10 DIAGNOSIS — J440 Chronic obstructive pulmonary disease with acute lower respiratory infection: Secondary | ICD-10-CM | POA: Diagnosis present

## 2022-05-10 DIAGNOSIS — Z9221 Personal history of antineoplastic chemotherapy: Secondary | ICD-10-CM

## 2022-05-10 DIAGNOSIS — E871 Hypo-osmolality and hyponatremia: Secondary | ICD-10-CM | POA: Diagnosis present

## 2022-05-10 DIAGNOSIS — J9621 Acute and chronic respiratory failure with hypoxia: Secondary | ICD-10-CM | POA: Diagnosis present

## 2022-05-10 DIAGNOSIS — Z7189 Other specified counseling: Secondary | ICD-10-CM | POA: Diagnosis not present

## 2022-05-10 DIAGNOSIS — C7802 Secondary malignant neoplasm of left lung: Secondary | ICD-10-CM | POA: Diagnosis present

## 2022-05-10 DIAGNOSIS — E1142 Type 2 diabetes mellitus with diabetic polyneuropathy: Secondary | ICD-10-CM | POA: Diagnosis present

## 2022-05-10 DIAGNOSIS — I480 Paroxysmal atrial fibrillation: Secondary | ICD-10-CM | POA: Diagnosis present

## 2022-05-10 DIAGNOSIS — C3492 Malignant neoplasm of unspecified part of left bronchus or lung: Secondary | ICD-10-CM | POA: Diagnosis present

## 2022-05-10 DIAGNOSIS — Y95 Nosocomial condition: Secondary | ICD-10-CM | POA: Diagnosis present

## 2022-05-10 DIAGNOSIS — I639 Cerebral infarction, unspecified: Secondary | ICD-10-CM | POA: Insufficient documentation

## 2022-05-10 DIAGNOSIS — K219 Gastro-esophageal reflux disease without esophagitis: Secondary | ICD-10-CM | POA: Diagnosis present

## 2022-05-10 DIAGNOSIS — F419 Anxiety disorder, unspecified: Secondary | ICD-10-CM | POA: Diagnosis present

## 2022-05-10 DIAGNOSIS — N179 Acute kidney failure, unspecified: Secondary | ICD-10-CM | POA: Diagnosis present

## 2022-05-10 DIAGNOSIS — I959 Hypotension, unspecified: Secondary | ICD-10-CM | POA: Diagnosis present

## 2022-05-10 DIAGNOSIS — I5033 Acute on chronic diastolic (congestive) heart failure: Secondary | ICD-10-CM | POA: Diagnosis present

## 2022-05-10 DIAGNOSIS — Z9981 Dependence on supplemental oxygen: Secondary | ICD-10-CM

## 2022-05-10 DIAGNOSIS — E861 Hypovolemia: Secondary | ICD-10-CM | POA: Diagnosis present

## 2022-05-10 DIAGNOSIS — C787 Secondary malignant neoplasm of liver and intrahepatic bile duct: Secondary | ICD-10-CM | POA: Diagnosis present

## 2022-05-10 DIAGNOSIS — R0602 Shortness of breath: Secondary | ICD-10-CM | POA: Insufficient documentation

## 2022-05-10 DIAGNOSIS — F1010 Alcohol abuse, uncomplicated: Secondary | ICD-10-CM | POA: Diagnosis present

## 2022-05-10 DIAGNOSIS — C7801 Secondary malignant neoplasm of right lung: Secondary | ICD-10-CM | POA: Diagnosis present

## 2022-05-10 LAB — COMPREHENSIVE METABOLIC PANEL
ALT: 27 U/L (ref 0–44)
AST: 26 U/L (ref 15–41)
Albumin: 2.8 g/dL — ABNORMAL LOW (ref 3.5–5.0)
Alkaline Phosphatase: 96 U/L (ref 38–126)
Anion gap: 10 (ref 5–15)
BUN: 19 mg/dL (ref 8–23)
CO2: 24 mmol/L (ref 22–32)
Calcium: 7.7 mg/dL — ABNORMAL LOW (ref 8.9–10.3)
Chloride: 96 mmol/L — ABNORMAL LOW (ref 98–111)
Creatinine, Ser: 1.18 mg/dL (ref 0.61–1.24)
GFR, Estimated: >60 mL/min (ref >60)
Glucose, Bld: 136 mg/dL — ABNORMAL HIGH (ref 70–99)
Potassium: 4 mmol/L (ref 3.5–5.1)
Sodium: 130 mmol/L — ABNORMAL LOW (ref 135–145)
Total Bilirubin: 0.6 mg/dL (ref 0.3–1.2)
Total Protein: 5.8 g/dL — ABNORMAL LOW (ref 6.5–8.1)

## 2022-05-10 LAB — CBC WITH DIFFERENTIAL/PLATELET
Abs Immature Granulocytes: 11.6 10*3/uL — ABNORMAL HIGH (ref 0.00–0.07)
Basophils Absolute: 0.1 10*3/uL (ref 0.0–0.1)
Basophils Relative: 0 %
Eosinophils Absolute: 0.1 10*3/uL (ref 0.0–0.5)
Eosinophils Relative: 0 %
HCT: 27.9 % — ABNORMAL LOW (ref 39.0–52.0)
Hemoglobin: 9.8 g/dL — ABNORMAL LOW (ref 13.0–17.0)
Immature Granulocytes: 23 %
Lymphocytes Relative: 3 %
Lymphs Abs: 1.3 10*3/uL (ref 0.7–4.0)
MCH: 34.6 pg — ABNORMAL HIGH (ref 26.0–34.0)
MCHC: 35.1 g/dL (ref 30.0–36.0)
MCV: 98.6 fL (ref 80.0–100.0)
Monocytes Absolute: 0.3 10*3/uL (ref 0.1–1.0)
Monocytes Relative: 1 %
Neutro Abs: 37.1 10*3/uL — ABNORMAL HIGH (ref 1.7–7.7)
Neutrophils Relative %: 73 %
Platelets: 262 10*3/uL (ref 150–400)
RBC: 2.83 MIL/uL — ABNORMAL LOW (ref 4.22–5.81)
RDW: 13.2 % (ref 11.5–15.5)
WBC: 50.5 10*3/uL (ref 4.0–10.5)
nRBC: 0 % (ref 0.0–0.2)

## 2022-05-10 LAB — MAGNESIUM: Magnesium: 1 mg/dL — ABNORMAL LOW (ref 1.7–2.4)

## 2022-05-10 LAB — BRAIN NATRIURETIC PEPTIDE: B Natriuretic Peptide: 300.4 pg/mL — ABNORMAL HIGH (ref 0.0–100.0)

## 2022-05-10 MED ORDER — SODIUM CHLORIDE 0.9 % IV SOLN
2.0000 g | Freq: Three times a day (TID) | INTRAVENOUS | Status: DC
  Administered 2022-05-11: 2 g via INTRAVENOUS
  Filled 2022-05-10: qty 12.5

## 2022-05-10 MED ORDER — MAGNESIUM SULFATE 2 GM/50ML IV SOLN
2.0000 g | INTRAVENOUS | Status: AC
  Administered 2022-05-10: 2 g via INTRAVENOUS
  Filled 2022-05-10: qty 50

## 2022-05-10 MED ORDER — CLOPIDOGREL BISULFATE 75 MG PO TABS: 75.0000 mg | ORAL_TABLET | Freq: Every day | ORAL | 1 refills | Status: DC

## 2022-05-10 MED ORDER — ENOXAPARIN SODIUM 40 MG/0.4ML IJ SOSY
40.0000 mg | PREFILLED_SYRINGE | INTRAMUSCULAR | Status: DC
  Administered 2022-05-11 – 2022-05-14 (×4): 40 mg via SUBCUTANEOUS
  Filled 2022-05-10 (×4): qty 0.4

## 2022-05-10 MED ORDER — IOHEXOL 350 MG/ML SOLN
75.0000 mL | Freq: Once | INTRAVENOUS | Status: AC | PRN
  Administered 2022-05-10: 75 mL via INTRAVENOUS

## 2022-05-10 MED ORDER — VANCOMYCIN HCL 1500 MG/300ML IV SOLN
1500.0000 mg | Freq: Once | INTRAVENOUS | Status: AC
  Administered 2022-05-10: 1500 mg via INTRAVENOUS
  Filled 2022-05-10: qty 300

## 2022-05-10 MED ORDER — MAGNESIUM SULFATE 4 GM/100ML IV SOLN
4.0000 g | Freq: Once | INTRAVENOUS | Status: AC
  Administered 2022-05-10: 4 g via INTRAVENOUS
  Filled 2022-05-10: qty 100

## 2022-05-10 MED ORDER — SODIUM CHLORIDE 0.9 % IV SOLN
1.0000 g | Freq: Once | INTRAVENOUS | Status: AC
  Administered 2022-05-10: 1 g via INTRAVENOUS
  Filled 2022-05-10: qty 10

## 2022-05-10 MED ORDER — ALPRAZOLAM 0.5 MG PO TABS
1.0000 mg | ORAL_TABLET | Freq: Three times a day (TID) | ORAL | Status: DC | PRN
  Administered 2022-05-10 – 2022-05-11 (×2): 1 mg via ORAL
  Filled 2022-05-10 (×4): qty 4

## 2022-05-10 MED ORDER — VANCOMYCIN HCL 1500 MG/300ML IV SOLN: 1500.0000 mg | INTRAVENOUS | Status: DC

## 2022-05-10 NOTE — ED Notes (Signed)
Messaged pharmacy for medication

## 2022-05-10 NOTE — Telephone Encounter (Signed)
Patient's wife, Vaughan Basta, left voicemail on [facility] phone reporting patient's increased demand for home O2. Per wife, patient was having difficulty breathing and chest pain around 6 this AM and pulse ox measured 81% on 2.5 [place] increased O2 to 3.5L and patient is currently satting 93%. Per wife, patient has completed albuterol nebulizer twice and some relief. This RN strongly advised that patient be evaluated at the ER due to increased demand for oxygen and previously stated chest pain. Patient declined going to the ER at this time. This RN advised patient to come in for scheduled MD appointment at 2p today for evaluation.

## 2022-05-10 NOTE — H&P (Incomplete)
History and Physical  Benjamin Case DPO:[id number] DOB: 08-Jan-1956 DOA: 05/10/2022  Referring physician: Spero Curb  PCP: [name], MD  Outpatient Specialists: Medical oncology. Patient coming from: Home.  Chief Complaint: Shortness of breath  HPI: Benjamin Case is a 66 y.o. male with medical history significant for metastatic lung cancer, paroxysmal A-fib on Eliquis, stopped taking the DOAC about a week ago after GI bleed, CVAs, chronic hypoxia on 2 L nasal cannula continuously, who presents with progressive worsening shortness of breath for several days.  Recently diagnosed and treated for pneumonia.  In the ED, CT angio chest negative for pulmonary embolism however shows patchy infiltrate suggestive of bilateral pneumonia.  Started on IV vancomycin and cefepime in the ED, [facility] was asked to admit.  ED Course: Tmax 99.  BP 98/55, pulse 114, respiratory 15, O2 saturation 93% on room air.  Lab studies remarkable for BNP 300.  Serum sodium 130, glucose 136, magnesium 1.0, WBC 15.5, hemoglobin 9.8, neutrophil count 37.1.  Review of Systems: Review of systems as noted in the HPI. All other systems reviewed and are negative.   Past Medical History:  Diagnosis Date   Arthritis    back   COPD (chronic obstructive pulmonary disease) ([place])    Diabetes mellitus without complication (HCC)    no medications , monitors cbg at home with monitor, avg no more than 150s at home    Dyspnea    increased exertion; pt states can climb flight of stairs w/o having to stop prior to reaching top ; ongoing reports it is due to "having one lung"    GERD (gastroesophageal reflux disease)    Hepatitis    hepatitis C; pt states competed treatments, unsure what medication he was treated with    History of chemotherapy    Hypertension    reports its been over a year since he stopped taking bp medication, reports he made her medical doctor aware that he was topping it    lung ca dx'd 12/2007   lt  lobectomy   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   and back - see note in [facility] 12/26/21   Numbness    FEET BILATERAL   Pneumonia 2009   Right upper lobe pulmonary nodule 04/29/2021   Rotator cuff tear    right    Past Surgical History:  Procedure Laterality Date   ARTHROSCOPIC REPAIR ACL Right    BRONCHIAL BIOPSY  06/09/2021   Procedure: BRONCHIAL BIOPSIES;  Surgeon: Garner Nash, DO;  Location: [facility];  Service: Pulmonary;;   BRONCHIAL BRUSHINGS  06/09/2021   Procedure: BRONCHIAL BRUSHINGS;  Surgeon: Garner Nash, DO;  Location: [place] [facility];  Service: Pulmonary;;   BRONCHIAL WASHINGS  06/09/2021   Procedure: BRONCHIAL WASHINGS;  Surgeon: Garner Nash, DO;  Location: [place];  Service: Pulmonary;;   CATARACT EXTRACTION, BILATERAL  2019   DECOMPRESSIVE LUMBAR LAMINECTOMY LEVEL 1 N/A 03/24/2016   Procedure: L3-4 CENTRAL DECOMPRESSION LUMBER LAMINECTOMY;  Surgeon: Latanya Maudlin, MD;  Location: [facility];  Service: Orthopedics;  Laterality: N/A;   FINE NEEDLE ASPIRATION  03/23/2022   Procedure: FINE NEEDLE ASPIRATION (FNA) LINEAR;  Surgeon: Garner Nash, DO;  Location: [place] [facility];  Service: Pulmonary;;   FOOT SURGERY Bilateral    HAND SURGERY Bilateral    LOBECTOMY Left 2009   pneumonectomy Left 2009   [name]   SHOULDER ARTHROSCOPY WITH DISTAL CLAVICLE RESECTION Right 12/14/2018   Procedure: SHOULDER ARTHROSCOPY WITH DISTAL CLAVICLE RESECTION;  Surgeon: Tamera Punt,  Larkin Ina, MD;  Location: [facility];  Service: Orthopedics;  Laterality: Right;   SHOULDER ARTHROSCOPY WITH ROTATOR CUFF REPAIR Right 12/14/2018   Procedure: SHOULDER ARTHROSCOPY WITH ROTATOR CUFF REPAIR;  Surgeon: Tania Ade, MD;  Location: [facility];  Service: Orthopedics;  Laterality: Right;   SHOULDER ARTHROSCOPY WITH SUBACROMIAL DECOMPRESSION Right 12/14/2018   Procedure: SHOULDER ARTHROSCOPY WITH SUBACROMIAL DECOMPRESSION;  Surgeon: Tania Ade, MD;  Location: [facility];  Service: Orthopedics;   Laterality: Right;   TRIGGER FINGER RELEASE     right hand, pointer finger    VIDEO BRONCHOSCOPY WITH ENDOBRONCHIAL ULTRASOUND Right 03/23/2022   Procedure: VIDEO BRONCHOSCOPY WITH ENDOBRONCHIAL ULTRASOUND;  Surgeon: Garner Nash, DO;  Location: [place];  Service: Pulmonary;  Laterality: Right;   VIDEO BRONCHOSCOPY WITH RADIAL ENDOBRONCHIAL ULTRASOUND  06/09/2021   Procedure: VIDEO BRONCHOSCOPY WITH RADIAL ENDOBRONCHIAL ULTRASOUND;  Surgeon: Garner Nash, DO;  Location: [place] [facility];  Service: Pulmonary;;    Social History:  reports that he has been smoking cigarettes. He has a 22.50 pack-year smoking history. He has never used smokeless tobacco. He reports current alcohol use. He reports that he does not use drugs.   No Known Allergies  Family History  Problem Relation Age of Onset   Cancer Mother     ***  Prior to Admission medications   Medication Sig Start Date End Date Taking? Authorizing Provider  ALPRAZolam Duanne Moron) 1 MG tablet Take 1 mg by mouth 3 (three) times daily as needed for anxiety. 04/20/15  Yes [provider]  Ascorbic Acid (VITAMIN C) 1000 MG tablet Take 1,000 mg by mouth daily.   Yes [provider]  aspirin EC 81 MG tablet Take 81 mg by mouth daily.   Yes [provider]  Aspirin-Caffeine (BC FAST PAIN RELIEF) 845-65 MG PACK Take 1 packet by mouth daily as needed (pain.).   Yes [provider]  atorvastatin (LIPITOR) 10 MG tablet Take 10 mg by mouth at bedtime.    Yes [provider]  B Complex-C (SUPER B COMPLEX PO) Take 1 tablet by mouth daily.   Yes [provider]  diltiazem (CARDIZEM SR) 60 MG 12 hr capsule Take 60 mg by mouth 2 (two) times daily.   Yes [provider]  esomeprazole (NEXIUM) 40 MG capsule Take 40 mg by mouth daily as needed (for heartburn).    Yes [provider]  folic acid (FOLVITE) 1 MG tablet Take 1 tablet (1 mg total) by mouth daily. 04/18/22  Yes Shawna Clamp, MD  guaiFENesin (MUCINEX) 600 MG 12 hr tablet Take 600 mg by mouth 2 (two) times daily.   Yes [provider]  ipratropium-albuterol (DUONEB) 0.5-2.5 (3) MG/3ML SOLN Take 3 mLs by nebulization 2 (two) times daily.   Yes [provider]  Omega-3 Fatty Acids ([employer] FISH OIL) 1000 MG CAPS Take 1,000 mg by mouth every evening.   Yes [provider]  oxyCODONE-acetaminophen (PERCOCET) 10-325 MG tablet Take 1 tablet by mouth 5 (five) times daily as needed for pain. 05/04/21  Yes [provider]  pregabalin (LYRICA) 75 MG capsule Take 75 mg by mouth 2 (two) times daily. 02/22/22  Yes [provider]  thiamine (VITAMIN B-1) 100 MG tablet Take 1 tablet (100 mg total) by mouth daily. 04/18/22  Yes Shawna Clamp, MD  clopidogrel (PLAVIX) 75 MG tablet Take 1 tablet (75 mg total) by mouth daily. Patient not taking: Reported on 05/10/2022 05/10/22   Ventura Sellers, MD  lidocaine-prilocaine (EMLA) cream  Apply 1 Application topically as needed. Patient not taking: Reported on 05/10/2022 05/06/22   Heilingoetter, Cassandra L, PA-C  meloxicam (MOBIC) 15 MG tablet Take 15 mg by mouth daily. Patient not taking: Reported on 05/10/2022 05/04/22   [provider]  metoprolol succinate (TOPROL XL) 25 MG 24 hr tablet Take 1 tablet (25 mg total) by mouth daily. 04/27/22   Loel Dubonnet, NP  prochlorperazine (COMPAZINE) 10 MG tablet Take 1 tablet (10 mg total) by mouth every 6 (six) hours as needed for nausea or vomiting. Patient not taking: Reported on 05/10/2022 04/01/22   Curt Bears, MD  vitamin B-12 (CYANOCOBALAMIN) 1000 MCG tablet Take 1,000 mcg by mouth daily.    [provider]    Physical Exam: BP (!) 116/56   Pulse 61   Temp 98 F (36.7 C) (Oral)   Resp 16   SpO2 93%   General: 66 y.o. year-old male well developed well nourished in no acute distress.  Alert and oriented x3. Cardiovascular: Regular rate and rhythm with no rubs or  gallops.  No thyromegaly or JVD noted.  No lower extremity edema. 2/4 pulses in all 4 extremities. Respiratory: Clear to auscultation with no wheezes or rales. Good inspiratory effort. Abdomen: Soft nontender nondistended with normal bowel sounds x4 quadrants. Muskuloskeletal: No cyanosis, clubbing or edema noted bilaterally Neuro: CN II-XII intact, strength, sensation, reflexes Skin: No ulcerative lesions noted or rashes Psychiatry: Judgement and insight appear normal. Mood is appropriate for condition and setting          Labs on Admission:  Basic Metabolic Panel: Recent Labs  Lab 05/05/22 1436 05/10/22 1830  NA 135 130*  K 4.5 4.0  CL 94* 96*  CO2 28 24  GLUCOSE 108* 136*  BUN 24* 19  CREATININE 1.31* 1.18  CALCIUM 8.9 7.7*  MG  --  1.0*   Liver Function Tests: Recent Labs  Lab 05/05/22 1436 05/10/22 1830  AST 28 26  ALT 56* 27  ALKPHOS 75 96  BILITOT 0.6 0.6  PROT 7.9 5.8*  ALBUMIN 4.0 2.8*   No results for input(s): "LIPASE", "AMYLASE" in the last 168 hours. No results for input(s): "AMMONIA" in the last 168 hours. CBC: Recent Labs  Lab 05/05/22 1436 05/10/22 1830  WBC 15.9* 50.5*  NEUTROABS 10.7* 37.1*  HGB 12.5* 9.8*  HCT 36.7* 27.9*  MCV 98.4 98.6  PLT 423* 262   Cardiac Enzymes: No results for input(s): "CKTOTAL", "CKMB", "CKMBINDEX", "TROPONINI" in the last 168 hours.  BNP (last 3 results) Recent Labs    04/11/22 1428 04/13/22 0923 05/10/22 1830  BNP 552.8* 525.5* 300.4*    ProBNP (last 3 results) No results for input(s): "PROBNP" in the last 8760 hours.  CBG: No results for input(s): "GLUCAP" in the last 168 hours.  Radiological Exams on Admission: CT Angio Chest PE W and/or Wo Contrast  Result Date: 05/10/2022 CLINICAL DATA:  Pulmonary embolism suspected, high probability. Increasing shortness of breath. History of lung cancer and currently being treated. Left lung removed. EXAM: CT ANGIOGRAPHY CHEST WITH CONTRAST TECHNIQUE:  Multidetector CT imaging of the chest was performed using the standard protocol during bolus administration of intravenous contrast. Multiplanar CT image reconstructions and MIPs were obtained to evaluate the vascular anatomy. RADIATION DOSE REDUCTION: This exam was performed according to the departmental dose-optimization program which includes automated exposure control, adjustment of the mA and/or kV according to patient size and/or use of iterative reconstruction technique. CONTRAST:  27mL OMNIPAQUE IOHEXOL 350 MG/ML SOLN  COMPARISON:  04/11/2022. FINDINGS: Cardiovascular: Heart is enlarged and there is a small pericardial effusion. Multi-vessel coronary artery calcifications are noted. There is atherosclerotic calcification of the aorta without evidence aneurysm. Pulmonary trunk is distended suggesting underlying pulmonary artery hypertension. No evidence of pulmonary embolism. Evaluation of the subsegmental arteries is limited. Mediastinum/Nodes: A conglomeration of enlarged lymph nodes are present in the mediastinum and right hilum, measuring up to 0 4.6 x 2.5 cm in the precarinal space versus 2.8 x 4.2 cm on the previous exam. No axillary lymphadenopathy. The thyroid gland, trachea, and esophagus are within normal limits. Lungs/Pleura: Status post left pneumonectomy with chronic fibrothorax. There is a small right pleural effusion. Hazy attenuation is noted in the right lung. Multiple pulmonary nodules are seen in the right lung. An azygous lobe is noted. Patchy infiltrates are noted in the posterior aspect of the right upper lobe and right middle lobe. No pneumothorax. Upper Abdomen: Multiple ill-defined hypodensities are present in the liver measuring up to 2.4 cm. There is a left adrenal nodule measuring 2.1 cm, previously characterized as an adenoma. There is nodular thickening of the right adrenal gland. Scattered nodules are noted in the retroperitoneum bilaterally. Musculoskeletal: Scattered lucencies  are noted in the bones, compatible with known metastatic disease and increased in size and number from the previous exam. There is a stable compression deformity in the superior endplate at T9. Stable postsurgical changes in the left ribs. Stable fracture in the T9 rib on the left. Stable old healed rib fractures on the right. Review of the MIP images confirms the above findings. IMPRESSION: 1. No evidence of pulmonary embolism. 2. Hazy attenuation in the right lung with patchy infiltrates in the right upper and middle lobes. 3. Small right pleural effusion. 4. Multiple pulmonary nodules in the right lung with mediastinal and right hilar lymphadenopathy, possible metastasis. 5. Multiple hepatic lesions and scattered nodules in the retroperitoneum bilaterally, concerning for metastatic disease. 6. Stable postsurgical changes of left pneumonectomy. 7. Increasing lucencies in the bones, compatible with known metastatic disease. Stable rib fracture at T9 on the left. 8. Aortic atherosclerosis. Electronically Signed   By: Brett Fairy M.D.   On: 05/10/2022 20:24   DG Chest 2 View  Result Date: 05/10/2022 CLINICAL DATA:  Shortness of breath, lung carcinoma EXAM: CHEST - 2 VIEW COMPARISON:  Previous studies including the examination of 04/28/2022 FINDINGS: There is previous left pneumonectomy. There is a interval increase in interstitial markings throughout the right lung. There is 1.5 cm nodular density in the right apical region with no significant change. Azygous fissure is seen in right upper lung field. There is minimal blunting of right lateral CP angle. IMPRESSION: There is significant interval increase in interstitial markings in right lung suggesting interstitial pneumonia or interstitial edema. Status post left pneumonectomy. Small right pleural effusion. There is 1.5 cm nodule in right apical region with no significant change. Electronically Signed   By: Elmer Picker M.D.   On: 05/10/2022 15:48     EKG: I independently viewed the EKG done and my findings are as followed: ***   Assessment/Plan Present on Admission:  Acute on chronic respiratory failure with hypoxia ([place])  Principal Problem:   Acute on chronic respiratory failure with hypoxia (HCC)   DVT prophylaxis: ***   Code Status: ***   Family Communication: ***   Disposition Plan: ***   Consults called: ***   Admission status: ***    Status is: Inpatient {Inpatient:23812}   Kayleen Memos MD  [facility] Pager [phone]  If 7PM-7AM, please contact night-coverage [email or web address] Password [facility]  05/10/2022, 9:32 PM

## 2022-05-10 NOTE — Progress Notes (Addendum)
Pharmacy Antibiotic Note  Benjamin Case is a 66 y.o. male admitted on 05/10/2022 with pneumonia.  Pharmacy has been consulted for Vancomycin and cefepime dosing.  CrCl 60-70 mL/min which appears close to baseline.  Plan: Vancomycin 1500mg  Q24H (eAUC 479, Scr 1.18, Vd 0.72) Cefepime 2g Q8H  F/u cultures for de-escalation Monitor renal function for dose adjustment     Temp (24hrs), Avg:98.5 F (36.9 C), Min:98 F (36.7 C), Max:99 F (37.2 C)  Recent Labs  Lab 05/05/22 1436 05/10/22 1830  WBC 15.9* 50.5*  CREATININE 1.31* 1.18    Estimated Creatinine Clearance: 61.9 mL/min (by C-G formula based on SCr of 1.18 mg/dL).    No Known Allergies  Antimicrobials this admission: Vancomycin 12/4 >> Cefepime 12/4  Microbiology results: 12/4 MRSA: ordered  Thank you for allowing pharmacy to be a part of this patient's care.  Merrilee Jansky, PharmD Clinical Pharmacist 05/10/2022 9:14 PM

## 2022-05-10 NOTE — Telephone Encounter (Signed)
This nurse reached out to this patient and spoke with his wife related to low oxygen levels. Wife states that patient is normally on 2.5L of oxygen and his sats dropped down to 81% she then turned his tank up to 4L and he is now satting at 95%.  However his heart rate is now jumping up and down between 85-142 BPM. This nurse advised that [name] and Cassandra Heilingoetter, PA-C advised that patient go to the Emergency department to have himself assessed due to his unstable vital signs.  Patient and his wife refuse to go to ED.  States that patient does not appear to be in distress at this time and they do not want to sit in the waiting room full of Covid patients.  The wife states that they are just going to attend the scheduled appointment today and see what that doctor recommends.  This nurse acknowledged understanding and stated that the providers will be made aware.

## 2022-05-10 NOTE — Progress Notes (Signed)
[facility] [place] [place], [place] [address] [phone]   New Patient Evaluation  Date of Service: 05/10/22 Patient Name: Benjamin Case Patient MRN: [id number] Patient DOB: [birth year] Provider: Ventura Sellers, MD  Identifying Statement:  Benjamin Case is a 66 y.o. 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  Benjamin Case presents for evaluation following visual episode, abnormal brain MRI.  He and his wife describe episode of sudden visual loss (bilateral, total) on 04/19/22.  Episode lasted 10 minutes and resolved on its own, he then returned to baseline.  MRI brain demonstrates abnormal findings, he was started on daily baby aspirin due to stroke concerns.  Previously he had been treated with Eliquis for atrial fibrillation, but this led to GI bleed and was discontinued.  GI bleeding has not recurred.  He denies any other focal neurologic complaints at this time, remains on chemotherapy and neulasta with [name] for recurrent lung cancer.  Medications: Current Outpatient Medications on File  Prior to Visit  Medication Sig Dispense Refill   ALPRAZolam (XANAX) 1 MG tablet Take 1 mg by mouth 3 (three) times daily as needed for anxiety.  5   Ascorbic Acid (VITAMIN C) 1000 MG tablet Take 1,000 mg by mouth daily.     aspirin EC 81 MG tablet Take 81 mg by mouth daily.     Aspirin-Caffeine (BC FAST PAIN RELIEF) 845-65 MG PACK Take 1 packet by mouth daily as needed (pain.).     atorvastatin (LIPITOR) 10 MG tablet Take 10 mg by mouth at bedtime.      B Complex-C (SUPER B COMPLEX PO) Take 1 tablet by mouth daily.     diltiazem (CARDIZEM SR) 60 MG 12 hr capsule Take 60 mg by mouth 2 (two) times daily.     esomeprazole (NEXIUM) 40 MG capsule Take 40 mg by mouth daily as needed (for heartburn).      folic acid (FOLVITE) 1 MG tablet Take 1 tablet (1 mg total) by mouth daily. 30 tablet 1   guaiFENesin (MUCINEX) 600 MG 12 hr tablet Take 600 mg by mouth 2 (two) times daily.     ipratropium-albuterol (DUONEB) 0.5-2.5 (3) MG/3ML SOLN Take 3 mLs by nebulization 2 (two) times daily.     lidocaine-prilocaine (EMLA) cream Apply 1 Application topically as needed. 30 g 2   meloxicam (MOBIC) 15 MG tablet Take 15 mg by mouth daily.     methocarbamol (ROBAXIN) 500 MG tablet Take 500 mg by mouth 2 (two) times daily as needed for muscle spasms.     methylPREDNISolone (MEDROL DOSEPAK) 4 MG TBPK tablet Use as instructed 21 tablet 0  metoprolol succinate (TOPROL XL) 25 MG 24 hr tablet Take 1 tablet (25 mg total) by mouth daily. 90 tablet 1   Omega-3 Fatty Acids ([employer] FISH OIL) 1000 MG CAPS Take 1,000 mg by mouth every evening.     oxyCODONE-acetaminophen (PERCOCET) 10-325 MG tablet Take 1 tablet by mouth 5 (five) times daily as needed for pain.     pregabalin (LYRICA) 75 MG capsule Take 75 mg by mouth 2 (two) times daily.     prochlorperazine (COMPAZINE) 10 MG tablet Take 1 tablet (10 mg total) by mouth every 6 (six) hours as needed for nausea or vomiting. 30 tablet 0   thiamine (VITAMIN B-1) 100 MG tablet Take 1  tablet (100 mg total) by mouth daily. 30 tablet 1   thiamine (VITAMIN B1) 100 MG tablet Take 100 mg by mouth daily.     vitamin B-12 (CYANOCOBALAMIN) 1000 MCG tablet Take 1,000 mcg by mouth daily.     No current facility-administered medications on file prior to visit.    Allergies: No Known Allergies Past Medical History:  Past Medical History:  Diagnosis Date   Arthritis    back   COPD (chronic obstructive pulmonary disease) ([place])    Diabetes mellitus without complication (HCC)    no medications , monitors cbg at home with monitor, avg no more than 150s at home    Dyspnea    increased exertion; pt states can climb flight of stairs w/o having to stop prior to reaching top ; ongoing reports it is due to "having one lung"    GERD (gastroesophageal reflux disease)    Hepatitis    hepatitis C; pt states competed treatments, unsure what medication he was treated with    History of chemotherapy    Hypertension    reports its been over a year since he stopped taking bp medication, reports he made her medical doctor aware that he was topping it    lung ca dx'd 12/2007   lt lobectomy   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   and back - see note in [facility] 12/26/21   Numbness    FEET BILATERAL   Pneumonia 2009   Right upper lobe pulmonary nodule 04/29/2021   Rotator cuff tear    right    Past Surgical History:  Past Surgical History:  Procedure Laterality Date   ARTHROSCOPIC REPAIR ACL Right    BRONCHIAL BIOPSY  06/09/2021   Procedure: BRONCHIAL BIOPSIES;  Surgeon: Garner Nash, DO;  Location: [facility];  Service: Pulmonary;;   BRONCHIAL BRUSHINGS  06/09/2021   Procedure: BRONCHIAL BRUSHINGS;  Surgeon: Garner Nash, DO;  Location: [facility];  Service: Pulmonary;;   BRONCHIAL WASHINGS  06/09/2021   Procedure: BRONCHIAL WASHINGS;  Surgeon: Garner Nash, DO;  Location: [facility];  Service: Pulmonary;;   CATARACT EXTRACTION, BILATERAL  2019   DECOMPRESSIVE LUMBAR LAMINECTOMY  LEVEL 1 N/A 03/24/2016   Procedure: L3-4 CENTRAL DECOMPRESSION LUMBER LAMINECTOMY;  Surgeon: Latanya Maudlin, MD;  Location: [facility];  Service: Orthopedics;  Laterality: N/A;   FINE NEEDLE ASPIRATION  03/23/2022   Procedure: FINE NEEDLE ASPIRATION (FNA) LINEAR;  Surgeon: Garner Nash, DO;  Location: [facility];  Service: Pulmonary;;   FOOT SURGERY Bilateral    HAND SURGERY Bilateral    LOBECTOMY Left 2009   pneumonectomy Left 2009   [name]   SHOULDER ARTHROSCOPY WITH DISTAL CLAVICLE RESECTION Right 12/14/2018   Procedure: SHOULDER ARTHROSCOPY WITH DISTAL CLAVICLE RESECTION;  Surgeon: Tania Ade, MD;  Location:  [facility];  Service: Orthopedics;  Laterality: Right;   SHOULDER ARTHROSCOPY WITH ROTATOR CUFF REPAIR Right 12/14/2018   Procedure: SHOULDER ARTHROSCOPY WITH ROTATOR CUFF REPAIR;  Surgeon: Tania Ade, MD;  Location: [facility];  Service: Orthopedics;  Laterality: Right;   SHOULDER ARTHROSCOPY WITH SUBACROMIAL DECOMPRESSION Right 12/14/2018   Procedure: SHOULDER ARTHROSCOPY WITH SUBACROMIAL DECOMPRESSION;  Surgeon: Tania Ade, MD;  Location: [facility];  Service: Orthopedics;  Laterality: Right;   TRIGGER FINGER RELEASE     right hand, pointer finger    VIDEO BRONCHOSCOPY WITH ENDOBRONCHIAL ULTRASOUND Right 03/23/2022   Procedure: VIDEO BRONCHOSCOPY WITH ENDOBRONCHIAL ULTRASOUND;  Surgeon: Garner Nash, DO;  Location: [facility];  Service: Pulmonary;  Laterality: Right;   VIDEO BRONCHOSCOPY WITH RADIAL ENDOBRONCHIAL ULTRASOUND  06/09/2021   Procedure: VIDEO BRONCHOSCOPY WITH RADIAL ENDOBRONCHIAL ULTRASOUND;  Surgeon: Garner Nash, DO;  Location: [facility];  Service: Pulmonary;;   Social History:  Social History   Socioeconomic History   Marital status: Married    Spouse name: Vaughan Basta   Number of children: 2   Years of education: 11   Highest education level: Not on file  Occupational History    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, Probation officer  Tobacco Use   Smoking  status: Every Day    Packs/day: 0.50    Years: 45.00    Total pack years: 22.50    Types: Cigarettes   Smokeless tobacco: Never   Tobacco comments:    08/04/16 3-6 cigs daily 02/22/2022 [place]    1/2 -3/4 ppd as of 03/22/22  Vaping Use   Vaping Use: Never used  Substance and Sexual Activity   Alcohol use: Yes    Comment: whiskey 4 drinks/day   Drug use: No   Sexual activity: Yes  Other Topics Concern   Not on file  Social History Narrative   Lives with wife   Caffeine - coffee, 1 cup daily   Social Determinants of Health   Financial Resource Strain: Not on file  Food Insecurity: No Food Insecurity (04/11/2022)   Hunger Vital Sign    Worried About Running Out of Food in the Last Year: Never true    Ran Out of Food in the Last Year: Never true  Transportation Needs: No Transportation Needs (04/11/2022)   PRAPARE - Hydrologist (Medical): No    Lack of Transportation (Non-Medical): No  Physical Activity: Not on file  Stress: Not on file  Social Connections: Not on file  Intimate Partner Violence: Not At Risk (04/11/2022)   Humiliation, Afraid, Rape, and Kick questionnaire    Fear of Current or Ex-Partner: No    Emotionally Abused: No    Physically Abused: No    Sexually Abused: No   Family History:  Family History  Problem Relation Age of Onset   Cancer Mother     Review of Systems: Constitutional: Doesn't report fevers, chills or abnormal weight loss Eyes: Doesn't report blurriness of vision Ears, nose, mouth, throat, and face: Doesn't report sore throat Respiratory: Doesn't report cough, dyspnea or wheezes Cardiovascular: Doesn't report palpitation, chest discomfort  Gastrointestinal:  Doesn't report nausea, constipation, diarrhea GU: Doesn't report incontinence Skin: Doesn't report skin rashes Neurological: Per HPI Musculoskeletal: Doesn't report joint pain Behavioral/Psych: Doesn't report anxiety  Physical Exam: Vitals:   05/10/22  1422  BP: 104/64  Pulse: (!) 110  Resp: 17  Temp: 99 F (37.2 C)  SpO2: 94%   KPS: 70. General: Alert, cooperative, pleasant, in no acute distress.  On supplemental O2. Head: Normal EENT: No conjunctival injection or scleral icterus.  Lungs: Resp effort normal Cardiac: Regular rate Abdomen: Non-distended abdomen Skin: No rashes cyanosis or petechiae. Extremities: No clubbing or edema  Neurologic Exam: Mental Status: Awake, alert, attentive to examiner. Oriented to self and environment. Language is fluent with intact comprehension.  Cranial Nerves: Visual acuity is grossly normal. Visual fields are full. Extra-ocular movements intact. No ptosis. Face is symmetric Motor: Tone and bulk are normal. Power is full in both arms and legs. Reflexes are symmetric, no pathologic reflexes present.  Sensory: Intact to light touch Gait: Deferred   Labs: I have reviewed the data as listed    Component Value Date/Time   NA 135 05/05/2022 1436   NA 138 04/26/2016 1205   K 4.5 05/05/2022 1436   K 5.0 04/26/2016 1205   CL 94 (L) 05/05/2022 1436   CL 103 04/27/2012 1253   CO2 28 05/05/2022 1436   CO2 30 (H) 04/26/2016 1205   GLUCOSE 108 (H) 05/05/2022 1436   GLUCOSE 127 04/26/2016 1205   GLUCOSE 114 (H) 04/27/2012 1253   BUN 24 (H) 05/05/2022 1436   BUN 6.0 (L) 04/26/2016 1205   CREATININE 1.31 (H) 05/05/2022 1436   CREATININE 1.3 04/26/2016 1205   CALCIUM 8.9 05/05/2022 1436   CALCIUM 9.8 04/26/2016 1205   PROT 7.9 05/05/2022 1436   PROT 7.1 04/26/2016 1205   ALBUMIN 4.0 05/05/2022 1436   ALBUMIN 3.7 04/26/2016 1205   AST 28 05/05/2022 1436   AST 40 (H) 04/26/2016 1205   ALT 56 (H) 05/05/2022 1436   ALT 25 04/26/2016 1205   ALKPHOS 75 05/05/2022 1436   ALKPHOS 60 04/26/2016 1205   BILITOT 0.6 05/05/2022 1436   BILITOT 0.47 04/26/2016 1205   GFRNONAA 59 (L) 05/05/2022 1436   GFRAA >60 12/11/2018 1122   Lab Results  Component Value Date   WBC 15.9 (H) 05/05/2022    NEUTROABS 10.7 (H) 05/05/2022   HGB 12.5 (L) 05/05/2022   HCT 36.7 (L) 05/05/2022   MCV 98.4 05/05/2022   PLT 423 (H) 05/05/2022    Imaging:  MR BRAIN W WO CONTRAST  Result Date: 05/05/2022 CLINICAL DATA:  Non-small cell lung cancer.  Staging. EXAM: MRI HEAD WITHOUT AND WITH CONTRAST TECHNIQUE: Multiplanar, multiecho pulse sequences of the brain and surrounding structures were obtained without and with intravenous contrast. CONTRAST:  7mL GADAVIST GADOBUTROL 1 MMOL/ML IV SOLN COMPARISON:  02/28/2008 FINDINGS: Brain: The brainstem is normal. Single punctate focus of restricted diffusion in the posterior right cerebellum favored to represent an acute/subacute cerebellar infarction. There are a few old small vessel cerebellar infarctions but no evidence of enhancing lesion to indicate posterior fossa metastatic disease. Within the cerebral hemispheres, there is a constellation of findings that I think indicate subacute infarctions within both hemispheres, particularly in the occipital lobes but with other scattered punctate foci in the frontal and parietal regions left more than right. It is not possible to exclude the coexistence of small metastatic lesions at this time, and I would recommend repeat imaging in 6 weeks at which time the smaller lesions should have lost their enhancement if they represent punctate embolic infarctions. It is certainly possible that both processes could be present in this case. There is some petechial blood product deposition within the larger occipital infarctions. None of the lesions are associated with vasogenic edema or mass effect. No hydrocephalus or extra-axial collection. There is an incidental left frontal convexity meningioma measuring 17 x 17  x 12 mm without any mass-effect upon the brain. Vascular: Major vessels at the base of the brain show flow. Skull and upper cervical spine: Otherwise negative Sinuses/Orbits: Clear/normal Other: None IMPRESSION: 1. Numerous  punctate foci of abnormal enhancement in the cerebral hemispheres, particularly in the occipital lobes. Larger occipital lobe lesions that are fairly convincingly secondary to subacute infarction. There is some petechial blood product deposition within the larger occipital infarctions. It is not possible to exclude the coexistence of small metastatic lesions at this time, and I would recommend repeat imaging in 6 weeks at which time the smaller lesions should have lost their enhancement if they represent punctate embolic infarctions. It is certainly possible that both processes could be present in this case, thus the need for short-term follow-up. In the meantime, one might consider neurological evaluation for potential etiology of embolic infarctions particularly in the posterior circulation distribution. 2. Incidental 17 x 17 x 12 mm left frontal convexity meningioma without mass effect or edema. Electronically Signed   By: Nelson Chimes M.D.   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 Recent hospitalization for pneumonia. Known effusion. Increased shortness of breath today. EXAM: CHEST - 2 VIEW COMPARISON:  CTA chest 04/11/2022, AP chest 04/17/2022 and 04/13/2022 FINDINGS: Postsurgical changes are again seen of left pneumonectomy. There is again left-sided volume loss and diffuse homogeneous opacification of the left hemithorax. Leftward mediastinal shift. Moderate calcifications are again seen within aortic arch. The cardiac silhouette is not well evaluated. Slight interval increase in right lower lung interstitial thickening compared to 11/11 2023. The aeration in this region is improved from 04/13/2022 frontal radiograph, however. Normal variant right azygous fissure. Multiple right apical lung nodules are better seen on prior CT. No large right pleural effusion is seen. No pneumothorax is  seen. Moderate multilevel degenerative disc changes of the thoracic spine. IMPRESSION: 1. Slight interval increase in right lower lung interstitial thickening compared to 11/11 2023. Nevertheless, the aeration in this region is improved from 04/13/2022 frontal radiograph. 2. Postsurgical changes of left pneumonectomy with leftward mediastinal shift. 3. Please note that multiple right apical lung nodules described on prior CT 04/11/2022 are better visualized on CT. Electronically Signed   By: Yvonne Kendall M.D.   On: 04/28/2022 12:04   DG CHEST PORT 1 VIEW  Result Date: 04/17/2022 CLINICAL DATA:  Pneumonia.  Follow-up study. EXAM: PORTABLE CHEST 1 VIEW COMPARISON:  04/13/2022.  CT, 04/11/2022. FINDINGS: Left hemithorax remains opacified with volume loss reflected by medial shift to the left. Interstitial and hazy airspace opacities in the right lung have improved. Right lung remains hyperexpanded. No new areas of right lung opacity. No right pleural effusion.  No pneumothorax. IMPRESSION: 1. Interval improvement in right lung aeration with a decrease in interstitial and hazy airspace lung opacities. This is consistent with either improved edema or improved infection. 2. Persistent opacification of the left hemithorax: Patient is status post left pneumonectomy. Electronically Signed   By: Lajean Manes M.D.   On: 04/17/2022 10:25   ECHOCARDIOGRAM COMPLETE  Result Date: 04/13/2022    ECHOCARDIOGRAM REPORT   Patient Name:   Benjamin Case Date of Exam: 04/13/2022 Medical Rec #:  [id number]       Height:       70.0 in Accession #:    [id number]      Weight:       196.6 lb  Date of Birth:  [birth year]       BSA:          2.072 m Patient Age:    56 years        BP:           104/71 mmHg Patient Gender: M               HR:           120 bpm. Exam Location:  Inpatient Procedure: 2D Echo, Cardiac Doppler, Color Doppler and Intracardiac            Opacification Agent Indications:    R94.31 Abnormal EKG  History:         Patient has no prior history of Echocardiogram examinations.                 COPD, Signs/Symptoms:Shortness of Breath and Dyspnea; Risk                 Factors:Current Smoker. Lung cancer. ETOH.  Sonographer:    Roseanna Rainbow RDCS Referring Phys: [id number] [facility]  Sonographer Comments: Technically difficult study due to poor echo windows. Image acquisition challenging due to patient body habitus. IMPRESSIONS  1. Left ventricular ejection fraction, by estimation, is 55 to 60%. The left ventricle has normal function. The left ventricle has no regional wall motion abnormalities. Left ventricular diastolic function could not be evaluated.  2. Right ventricular systolic function is normal. The right ventricular size is severely enlarged. There is moderately elevated pulmonary artery systolic pressure.  3. Right atrial size was severely dilated.  4. The mitral valve is normal in structure. No evidence of mitral valve regurgitation. No evidence of mitral stenosis.  5. Tricuspid valve regurgitation is mild to moderate.  6. The aortic valve is tricuspid. Aortic valve regurgitation is not visualized. No aortic stenosis is present.  7. The inferior vena cava is dilated in size with >50% respiratory variability, suggesting right atrial pressure of 8 mmHg. FINDINGS  Left Ventricle: Left ventricular ejection fraction, by estimation, is 55 to 60%. The left ventricle has normal function. The left ventricle has no regional wall motion abnormalities. The left ventricular internal cavity size was normal in size. There is  no left ventricular hypertrophy. Left ventricular diastolic function could not be evaluated due to atrial fibrillation. Left ventricular diastolic function could not be evaluated. Right Ventricle: The right ventricular size is severely enlarged. No increase in right ventricular wall thickness. Right ventricular systolic function is normal. There is moderately elevated pulmonary artery systolic pressure. The tricuspid  regurgitant velocity is 3.47 m/s, and with an assumed right atrial pressure of 8 mmHg, the estimated right ventricular systolic pressure is 76.5 mmHg. Left Atrium: Left atrial size was normal in size. Right Atrium: Right atrial size was severely dilated. Pericardium: There is no evidence of pericardial effusion. Mitral Valve: The mitral valve is normal in structure. No evidence of mitral valve regurgitation. No evidence of mitral valve stenosis. Tricuspid Valve: The tricuspid valve is normal in structure. Tricuspid valve regurgitation is mild to moderate. No evidence of tricuspid stenosis. Aortic Valve: The aortic valve is tricuspid. Aortic valve regurgitation is not visualized. No aortic stenosis is present. Pulmonic Valve: The pulmonic valve was normal in structure. Pulmonic valve regurgitation is not visualized. No evidence of pulmonic stenosis. Aorta: The aortic root is normal in size and structure. Venous: The inferior vena cava is dilated in size with greater than 50% respiratory variability, suggesting right atrial pressure of  8 mmHg. IAS/Shunts: No atrial level shunt detected by color flow Doppler.  LEFT VENTRICLE PLAX 2D LVIDd:         4.20 cm LVIDs:         3.00 cm LV PW:         1.10 cm LV IVS:        0.96 cm LVOT diam:     2.40 cm LV SV:         53 LV SV Index:   26 LVOT Area:     4.52 cm  LV Volumes (MOD) LV vol d, MOD A4C: 58.8 ml LV vol s, MOD A4C: 18.5 ml LV SV MOD A4C:     58.8 ml RIGHT VENTRICLE             IVC RV S prime:     12.60 cm/s  IVC diam: 2.10 cm TAPSE (M-mode): 1.9 cm LEFT ATRIUM           Index        RIGHT ATRIUM           Index LA diam:      3.40 cm 1.64 cm/m   RA Area:     31.50 cm LA Vol (A2C): 29.1 ml 14.04 ml/m  RA Volume:   122.00 ml 58.87 ml/m LA Vol (A4C): 48.6 ml 23.45 ml/m  AORTIC VALVE LVOT Vmax:   106.10 cm/s LVOT Vmean:  68.350 cm/s LVOT VTI:    0.118 m  AORTA Ao Root diam: 3.70 cm Ao Asc diam:  3.30 cm MITRAL VALVE               TRICUSPID VALVE MV Area (PHT): 5.01  cm    TR Peak grad:   48.2 mmHg MV Decel Time: 152 msec    TR Vmax:        347.00 cm/s MV E velocity: 87.55 cm/s                            SHUNTS                            Systemic VTI:  0.12 m                            Systemic Diam: 2.40 cm Dani Gobble Croitoru MD Electronically signed by Sanda Klein MD Signature Date/Time: 04/13/2022/4:06:09 PM    Final    DG Chest Port 1 View  Result Date: 04/13/2022 CLINICAL DATA:  Dyspnea EXAM: PORTABLE CHEST 1 VIEW COMPARISON:  Previous studies including the examination of 04/11/2022 FINDINGS: There is opacification of left hemithorax consistent with left pneumonectomy. Increased interstitial and alveolar markings are seen in right lower lung field with interval worsening. There is improvement in the aeration of right upper lung field. Small right pleural effusion is seen. There is no pneumothorax. IMPRESSION: There is interval worsening of infiltrate in right lower lung fields suggesting worsening pneumonia. There is improvement in the aeration in right upper lung field suggesting decrease in interstitial edema or interstitial pneumonia. Small right pleural effusion. Electronically Signed   By: Elmer Picker M.D.   On: 04/13/2022 10:11   CT Angio Chest PE W and/or Wo Contrast  Result Date: 04/11/2022 CLINICAL DATA:  Pulmonary embolism (PE) suspected, unknown D-dimer Shortness of breath. Radiologic records indicates history of lung cancer, prior pneumonectomy. EXAM: CT ANGIOGRAPHY  CHEST WITH CONTRAST TECHNIQUE: Multidetector CT imaging of the chest was performed using the standard protocol during bolus administration of intravenous contrast. Multiplanar CT image reconstructions and MIPs were obtained to evaluate the vascular anatomy. RADIATION DOSE REDUCTION: This exam was performed according to the departmental dose-optimization program which includes automated exposure control, adjustment of the mA and/or kV according to patient size and/or use of iterative  reconstruction technique. CONTRAST:  51mL OMNIPAQUE IOHEXOL 350 MG/ML SOLN COMPARISON:  Chest radiograph earlier today. CT 03/17/2022. PET CT 03/12/2022 FINDINGS: Cardiovascular: There are no filling defects within the pulmonary arteries to suggest pulmonary embolus. Chronic central enlargement of the main pulmonary artery. Grossly normal heart size, shifted to the left due to pneumonectomy changes. There are coronary artery calcifications. Atherosclerosis of the thoracic aorta without aneurysm or dissection. Mediastinum/Nodes: Enlarging precarinal node measuring 2.8 x 4.2 cm, series 5, image 52, previously 3.6 x 3 cm. 19 mm subcarinal node series 5, image 65, previously 18 mm. There is a 2 x 4 cm right hilar node series 5, image 66, comparison with prior is limited due to lack contrast on that exam. Low right hilar nodes measure up to 12 mm. Scattered retained debris in the esophagus. Stable volume loss in the left hemithorax post pneumonectomy with mediastinal shift. 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 Small to moderate-sized right pleural effusion is new from prior exam. There are new areas of ground-glass and reticulonodular opacities throughout the right lung, predominantly involving the right upper and middle lobes. Previous right upper lobe cavitary nodule is not well assessed on the current exam due to motion, in the region of series 11, image 44. r medial right apical nodule has increased in size 13 x 9 mm series 11, image 34, previously 10 x 7 mm. Enlarging right apical nodule at 8 mm series 11, image 27, previously 4 mm. There is narrowing of the right mainstem bronchus with middle and lower lobe bronchial thickening. Occasional areas of mucoid impaction in the lower lobe. Breathing motion artifact limits detailed assessment. Upper Abdomen: Low-density liver lesions in the left lobe is unchanged from recent chest CT, series 5, image 125 there are  multiple additional low-density lesions in the right and left hepatic lobe. These are not definitively seen on recent CT under typical of metastatic disease. Musculoskeletal: Patient with known osseous metastatic disease. Lucency involving the posterior aspect of T3 vertebral body, series 8, image 111. Lucent lesion in the manubrium is again seen. Left lateral ninth rib fracture is new. There are multiple remote left rib fractures and postsurgical change in the left hemithorax. Review of the MIP images confirms the above findings. IMPRESSION: 1. No pulmonary embolus. 2. New areas of ground-glass and reticulonodular opacities throughout the right lung, predominantly involving the right upper and middle lobes. This is likely infectious, including atypical viral organisms. 3. Small to moderate right pleural effusion is new from prior exam. 4. Right hilar adenopathy causes narrowing of the right mainstem bronchus. 5. Enlarging mediastinal adenopathy consistent with worsening nodal disease. 6. Enlarging nodules in the right upper lobe. One of the nodules on prior chest CT is obscured on the current exam due to motion. 7. Left lateral ninth rib fracture is new. Osseous metastatic disease involving the sternum and T3 vertebral body, better demonstrated on prior PET again seen. 8. There are multiple low-density lesions in the liver that were not definitively seen on recent prior, and suspicious for metastatic disease. This is not well assessed  on this phase of contrast tailored to pulmonary artery assessment. Aortic Atherosclerosis (ICD10-I70.0). Electronically Signed   By: Keith Rake M.D.   On: 04/11/2022 16:25   DG Chest 2 View  Result Date: 04/11/2022 CLINICAL DATA:  Shortness of breath. History of LEFT pneumonectomy for LEFT lung cancer. EXAM: CHEST - 2 VIEW COMPARISON:  03/17/2022 CT, 06/09/2021 radiograph and prior studies FINDINGS: LEFT pneumonectomy changes again noted. Interstitial and airspace opacities  within the RIGHT lung are now noted. A trace RIGHT pleural effusions present. There is no evidence of pneumothorax. No acute bony abnormalities are identified. IMPRESSION: Interstitial and airspace opacities within the RIGHT lung with trace RIGHT pleural effusion. This may represent pulmonary edema or infection. Electronically Signed   By: Margarette Canada M.D.   On: 04/11/2022 14:23    [facility] Interpretation: I have personally reviewed the radiological images as listed.  My interpretation, in the context of the patient's clinical presentation, is  infarcts vs metastases or both   Assessment/Plan Cerebrovascular accident (CVA) due to bilateral embolism of posterior cerebral arteries ([place])  Benjamin Case presents with clinical and radiographic syndrome consistent with bilateral occipital lobe infarcts. Brain MRI also demonstrates diffuse tiny enhancing lesions consistent with additional infarcts vs novel metastases from lung cancer.  Etiology of stroke is cardio-embolism from known atrial fibrillation.  Unfortunately he was unable to tolerate anticoagulation.  He obtained an echocardiogram at the end of November through his cardiologist, no overt thrombus.    For secondary stroke prevention, we recommended dual antiplatelet therapy with Clopidogrel 75mg  daily in addition to aspirin 81mg  daily, if tolerated.  He understands he will remain at high risk for stroke, as well as increased risk of hemorrhage on this regimen.    Will remain on Lipitor, rate control for atrial fibrillation.    For smaller lesions distal from larger occipital infarcts, will recommend repeating MRI brain in ~1 month.  This will help differentiate vascular from neoplastic phenomena.  For dyspnea, increased oxygen requirement, lung team will assess today.      We spent twenty additional minutes teaching regarding the natural history, biology, and historical experience in the treatment of neurologic complications of cancer.    We appreciate the opportunity to participate in the care of Benjamin Case.  We ask that Benjamin Case return to clinic in 1 months following next brain MRI, or sooner as needed.  All questions were answered. The patient knows to call the clinic with any problems, questions or concerns. No barriers to learning were detected.  The total time spent in the encounter was 40 minutes and more than 50% was on counseling and review of test results   Ventura Sellers, MD Medical Director of Neuro-Oncology [facility] [place] 05/10/22 2:16 PM

## 2022-05-10 NOTE — ED Notes (Signed)
Provider at bedside

## 2022-05-10 NOTE — H&P (Incomplete)
History and Physical  COLONEL KRAUSER WIO:[id number] DOB: 02-06-1956 DOA: 05/10/2022  Referring physician: Spero Curb  PCP: [name], MD  Outpatient Specialists: Medical oncology. Patient coming from: Home.  Chief Complaint: Shortness of breath  HPI: Benjamin Case is a 66 y.o. male with medical history significant for Stage IV metastatic lung cancer, paroxysmal A-fib previously on Eliquis, stopped taking the DOAC on 05/02/22 after GI bleed, CVAs, chronic hypoxia on 2 L nasal cannula continuously, who presents with progressive worsening shortness of breath for several days.  Recently diagnosed and hospitalized for pneumonia.  In the ED, CT angio chest negative for pulmonary embolism however shows patchy infiltrates suggestive of bilateral pneumonia.  Started on IV vancomycin and cefepime in the ED, [facility] was asked to admit.  ED Course: Tmax 99.  BP 98/55, pulse 114, respiratory 15, O2 saturation 93% on room air.  Lab studies remarkable for BNP 300.  Serum sodium 130, glucose 136, magnesium 1.0, WBC 15.5, hemoglobin 9.8, neutrophil count 37.1.  Review of Systems: Review of systems as noted in the HPI. All other systems reviewed and are negative.   Past Medical History:  Diagnosis Date  . Arthritis    back  . COPD (chronic obstructive pulmonary disease) ([place])   . Diabetes mellitus without complication (HCC)    no medications , monitors cbg at home with monitor, avg no more than 150s at home   . Dyspnea    increased exertion; pt states can climb flight of stairs w/o having to stop prior to reaching top ; ongoing reports it is due to "having one lung"   . GERD (gastroesophageal reflux disease)   . Hepatitis    hepatitis C; pt states competed treatments, unsure what medication he was treated with   . History of chemotherapy   . Hypertension    reports its been over a year since he stopped taking bp medication, reports he made her medical doctor aware that he was topping it   .  lung ca dx'd 12/2007   lt lobectomy  . 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   and back - see note in [facility] 12/26/21  . Numbness    FEET BILATERAL  . Pneumonia 2009  . Right upper lobe pulmonary nodule 04/29/2021  . Rotator cuff tear    right    Past Surgical History:  Procedure Laterality Date  . ARTHROSCOPIC REPAIR ACL Right   . BRONCHIAL BIOPSY  06/09/2021   Procedure: BRONCHIAL BIOPSIES;  Surgeon: Garner Nash, DO;  Location: [facility];  Service: Pulmonary;;  . BRONCHIAL BRUSHINGS  06/09/2021   Procedure: BRONCHIAL BRUSHINGS;  Surgeon: Garner Nash, DO;  Location: [facility];  Service: Pulmonary;;  . BRONCHIAL WASHINGS  06/09/2021   Procedure: BRONCHIAL WASHINGS;  Surgeon: Garner Nash, DO;  Location: [facility];  Service: Pulmonary;;  . CATARACT EXTRACTION, BILATERAL  2019  . DECOMPRESSIVE LUMBAR LAMINECTOMY LEVEL 1 N/A 03/24/2016   Procedure: L3-4 CENTRAL DECOMPRESSION LUMBER LAMINECTOMY;  Surgeon: Latanya Maudlin, MD;  Location: [facility];  Service: Orthopedics;  Laterality: N/A;  . FINE NEEDLE ASPIRATION  03/23/2022   Procedure: FINE NEEDLE ASPIRATION (FNA) LINEAR;  Surgeon: Garner Nash, DO;  Location: [facility];  Service: Pulmonary;;  . FOOT SURGERY Bilateral   . HAND SURGERY Bilateral   . LOBECTOMY Left 2009  . pneumonectomy Left 2009   [name]  . SHOULDER ARTHROSCOPY WITH DISTAL CLAVICLE RESECTION Right 12/14/2018   Procedure: SHOULDER ARTHROSCOPY WITH DISTAL CLAVICLE RESECTION;  Surgeon:  Tania Ade, MD;  Location: [facility];  Service: Orthopedics;  Laterality: Right;  . SHOULDER ARTHROSCOPY WITH ROTATOR CUFF REPAIR Right 12/14/2018   Procedure: SHOULDER ARTHROSCOPY WITH ROTATOR CUFF REPAIR;  Surgeon: Tania Ade, MD;  Location: [facility];  Service: Orthopedics;  Laterality: Right;  . SHOULDER ARTHROSCOPY WITH SUBACROMIAL DECOMPRESSION Right 12/14/2018   Procedure: SHOULDER ARTHROSCOPY WITH SUBACROMIAL DECOMPRESSION;  Surgeon: Tania Ade, MD;   Location: [facility];  Service: Orthopedics;  Laterality: Right;  . TRIGGER FINGER RELEASE     right hand, pointer finger   . VIDEO BRONCHOSCOPY WITH ENDOBRONCHIAL ULTRASOUND Right 03/23/2022   Procedure: VIDEO BRONCHOSCOPY WITH ENDOBRONCHIAL ULTRASOUND;  Surgeon: Garner Nash, DO;  Location: [facility];  Service: Pulmonary;  Laterality: Right;  Marland Kitchen VIDEO BRONCHOSCOPY WITH RADIAL ENDOBRONCHIAL ULTRASOUND  06/09/2021   Procedure: VIDEO BRONCHOSCOPY WITH RADIAL ENDOBRONCHIAL ULTRASOUND;  Surgeon: Garner Nash, DO;  Location: [facility];  Service: Pulmonary;;    Social History:  reports that he has been smoking cigarettes. He has a 22.50 pack-year smoking history. He has never used smokeless tobacco. He reports current alcohol use. He reports that he does not use drugs.   No Known Allergies  Family History  Problem Relation Age of Onset  . Cancer Mother     ***  Prior to Admission medications   Medication Sig Start Date End Date Taking? Authorizing Provider  ALPRAZolam Duanne Moron) 1 MG tablet Take 1 mg by mouth 3 (three) times daily as needed for anxiety. 04/20/15  Yes [provider]  Ascorbic Acid (VITAMIN C) 1000 MG tablet Take 1,000 mg by mouth daily.   Yes [provider]  aspirin EC 81 MG tablet Take 81 mg by mouth daily.   Yes [provider]  Aspirin-Caffeine (BC FAST PAIN RELIEF) 845-65 MG PACK Take 1 packet by mouth daily as needed (pain.).   Yes [provider]  atorvastatin (LIPITOR) 10 MG tablet Take 10 mg by mouth at bedtime.    Yes [provider]  B Complex-C (SUPER B COMPLEX PO) Take 1 tablet by mouth daily.   Yes [provider]  diltiazem (CARDIZEM SR) 60 MG 12 hr capsule Take 60 mg by mouth 2 (two) times daily.   Yes [provider]  esomeprazole (NEXIUM) 40 MG capsule Take 40 mg by mouth daily as needed (for heartburn).    Yes [provider]  folic acid (FOLVITE) 1 MG tablet Take 1 tablet (1 mg  total) by mouth daily. 04/18/22  Yes Shawna Clamp, MD  guaiFENesin (MUCINEX) 600 MG 12 hr tablet Take 600 mg by mouth 2 (two) times daily.   Yes [provider]  ipratropium-albuterol (DUONEB) 0.5-2.5 (3) MG/3ML SOLN Take 3 mLs by nebulization 2 (two) times daily.   Yes [provider]  Omega-3 Fatty Acids ([employer] FISH OIL) 1000 MG CAPS Take 1,000 mg by mouth every evening.   Yes [provider]  oxyCODONE-acetaminophen (PERCOCET) 10-325 MG tablet Take 1 tablet by mouth 5 (five) times daily as needed for pain. 05/04/21  Yes [provider]  pregabalin (LYRICA) 75 MG capsule Take 75 mg by mouth 2 (two) times daily. 02/22/22  Yes [provider]  thiamine (VITAMIN B-1) 100 MG tablet Take 1 tablet (100 mg total) by mouth daily. 04/18/22  Yes Shawna Clamp, MD  clopidogrel (PLAVIX) 75 MG tablet Take 1 tablet (75 mg total) by mouth daily. Patient not taking: Reported on 05/10/2022 05/10/22   Ventura Sellers, MD  lidocaine-prilocaine (EMLA)  cream Apply 1 Application topically as needed. Patient not taking: Reported on 05/10/2022 05/06/22   Heilingoetter, Cassandra L, PA-C  meloxicam (MOBIC) 15 MG tablet Take 15 mg by mouth daily. Patient not taking: Reported on 05/10/2022 05/04/22   [provider]  metoprolol succinate (TOPROL XL) 25 MG 24 hr tablet Take 1 tablet (25 mg total) by mouth daily. 04/27/22   Loel Dubonnet, NP  prochlorperazine (COMPAZINE) 10 MG tablet Take 1 tablet (10 mg total) by mouth every 6 (six) hours as needed for nausea or vomiting. Patient not taking: Reported on 05/10/2022 04/01/22   Curt Bears, MD  vitamin B-12 (CYANOCOBALAMIN) 1000 MCG tablet Take 1,000 mcg by mouth daily.    [provider]    Physical Exam: BP (!) 116/56   Pulse 61   Temp 98 F (36.7 C) (Oral)   Resp 16   SpO2 93%   General: 66 y.o. year-old male well developed well nourished in no acute distress.  Alert and oriented  x3. Cardiovascular: Regular rate and rhythm with no rubs or gallops.  No thyromegaly or JVD noted.  No lower extremity edema. 2/4 pulses in all 4 extremities. Respiratory: Clear to auscultation with no wheezes or rales. Good inspiratory effort. Abdomen: Soft nontender nondistended with normal bowel sounds x4 quadrants. Muskuloskeletal: No cyanosis, clubbing or edema noted bilaterally Neuro: CN II-XII intact, strength, sensation, reflexes Skin: No ulcerative lesions noted or rashes Psychiatry: Judgement and insight appear normal. Mood is appropriate for condition and setting          Labs on Admission:  Basic Metabolic Panel: Recent Labs  Lab 05/05/22 1436 05/10/22 1830  NA 135 130*  K 4.5 4.0  CL 94* 96*  CO2 28 24  GLUCOSE 108* 136*  BUN 24* 19  CREATININE 1.31* 1.18  CALCIUM 8.9 7.7*  MG  --  1.0*   Liver Function Tests: Recent Labs  Lab 05/05/22 1436 05/10/22 1830  AST 28 26  ALT 56* 27  ALKPHOS 75 96  BILITOT 0.6 0.6  PROT 7.9 5.8*  ALBUMIN 4.0 2.8*   No results for input(s): "LIPASE", "AMYLASE" in the last 168 hours. No results for input(s): "AMMONIA" in the last 168 hours. CBC: Recent Labs  Lab 05/05/22 1436 05/10/22 1830  WBC 15.9* 50.5*  NEUTROABS 10.7* 37.1*  HGB 12.5* 9.8*  HCT 36.7* 27.9*  MCV 98.4 98.6  PLT 423* 262   Cardiac Enzymes: No results for input(s): "CKTOTAL", "CKMB", "CKMBINDEX", "TROPONINI" in the last 168 hours.  BNP (last 3 results) Recent Labs    04/11/22 1428 04/13/22 0923 05/10/22 1830  BNP 552.8* 525.5* 300.4*    ProBNP (last 3 results) No results for input(s): "PROBNP" in the last 8760 hours.  CBG: No results for input(s): "GLUCAP" in the last 168 hours.  Radiological Exams on Admission: CT Angio Chest PE W and/or Wo Contrast  Result Date: 05/10/2022 CLINICAL DATA:  Pulmonary embolism suspected, high probability. Increasing shortness of breath. History of lung cancer and currently being treated. Left lung  removed. EXAM: CT ANGIOGRAPHY CHEST WITH CONTRAST TECHNIQUE: Multidetector CT imaging of the chest was performed using the standard protocol during bolus administration of intravenous contrast. Multiplanar CT image reconstructions and MIPs were obtained to evaluate the vascular anatomy. RADIATION DOSE REDUCTION: This exam was performed according to the departmental dose-optimization program which includes automated exposure control, adjustment of the mA and/or kV according to patient size and/or use of iterative reconstruction technique. CONTRAST:  110mL OMNIPAQUE IOHEXOL 350 MG/ML  SOLN COMPARISON:  04/11/2022. FINDINGS: Cardiovascular: Heart is enlarged and there is a small pericardial effusion. Multi-vessel coronary artery calcifications are noted. There is atherosclerotic calcification of the aorta without evidence aneurysm. Pulmonary trunk is distended suggesting underlying pulmonary artery hypertension. No evidence of pulmonary embolism. Evaluation of the subsegmental arteries is limited. Mediastinum/Nodes: A conglomeration of enlarged lymph nodes are present in the mediastinum and right hilum, measuring up to 0 4.6 x 2.5 cm in the precarinal space versus 2.8 x 4.2 cm on the previous exam. No axillary lymphadenopathy. The thyroid gland, trachea, and esophagus are within normal limits. Lungs/Pleura: Status post left pneumonectomy with chronic fibrothorax. There is a small right pleural effusion. Hazy attenuation is noted in the right lung. Multiple pulmonary nodules are seen in the right lung. An azygous lobe is noted. Patchy infiltrates are noted in the posterior aspect of the right upper lobe and right middle lobe. No pneumothorax. Upper Abdomen: Multiple ill-defined hypodensities are present in the liver measuring up to 2.4 cm. There is a left adrenal nodule measuring 2.1 cm, previously characterized as an adenoma. There is nodular thickening of the right adrenal gland. Scattered nodules are noted in the  retroperitoneum bilaterally. Musculoskeletal: Scattered lucencies are noted in the bones, compatible with known metastatic disease and increased in size and number from the previous exam. There is a stable compression deformity in the superior endplate at T9. Stable postsurgical changes in the left ribs. Stable fracture in the T9 rib on the left. Stable old healed rib fractures on the right. Review of the MIP images confirms the above findings. IMPRESSION: 1. No evidence of pulmonary embolism. 2. Hazy attenuation in the right lung with patchy infiltrates in the right upper and middle lobes. 3. Small right pleural effusion. 4. Multiple pulmonary nodules in the right lung with mediastinal and right hilar lymphadenopathy, possible metastasis. 5. Multiple hepatic lesions and scattered nodules in the retroperitoneum bilaterally, concerning for metastatic disease. 6. Stable postsurgical changes of left pneumonectomy. 7. Increasing lucencies in the bones, compatible with known metastatic disease. Stable rib fracture at T9 on the left. 8. Aortic atherosclerosis. Electronically Signed   By: Brett Fairy M.D.   On: 05/10/2022 20:24   DG Chest 2 View  Result Date: 05/10/2022 CLINICAL DATA:  Shortness of breath, lung carcinoma EXAM: CHEST - 2 VIEW COMPARISON:  Previous studies including the examination of 04/28/2022 FINDINGS: There is previous left pneumonectomy. There is a interval increase in interstitial markings throughout the right lung. There is 1.5 cm nodular density in the right apical region with no significant change. Azygous fissure is seen in right upper lung field. There is minimal blunting of right lateral CP angle. IMPRESSION: There is significant interval increase in interstitial markings in right lung suggesting interstitial pneumonia or interstitial edema. Status post left pneumonectomy. Small right pleural effusion. There is 1.5 cm nodule in right apical region with no significant change. Electronically  Signed   By: Elmer Picker M.D.   On: 05/10/2022 15:48    EKG: I independently viewed the EKG done and my findings are as followed: ***   Assessment/Plan Present on Admission: . Acute on chronic respiratory failure with hypoxia (HCC)  Principal Problem:   Acute on chronic respiratory failure with hypoxia (HCC)   DVT prophylaxis: ***   Code Status: ***   Family Communication: ***   Disposition Plan: ***   Consults called: ***   Admission status: ***    Status is: Inpatient {Inpatient:[id number]}   [employer]  MD [facility] Pager [phone]  If 7PM-7AM, please contact night-coverage [email or web address] Password [facility]  05/10/2022, 9:32 PM

## 2022-05-10 NOTE — Progress Notes (Signed)
In summary, this patient is being treated for stage IV lung cancer. He has several comorbidities. Please see my note from 05/05/22 to review. He was recently hospitalized for pneumonia, newly diagnosed afib, and GI blood loss from anticoagulation. He also was found to have strokes from his afib. Due to all of his serious health conditions, he is often advised to go to the ER for evaluation but refuses. This morning, he woke up in the middle this morning and was short of breath. His oxygen was 81%. He generally wears 2.5 L of oxygen but is currently on 4L. He took two breathing treatments this morning. He called the clinic and was advised to go to the ER. The patient, again, is reluctant to go to the ER despite medical advice. He presented to the clinic today to establish with [name] for his recent embolic strokes, likely due to his atrial fibrillation who is staring him om plavix and aspirin.  His oxygen is 94% on 4 L of oxygen. Temp 99, BP 104/64. He is not tachypnic. His pulse was initially noted to be 110 but was no longer tachycardic on reassessment. He stated his breathing was "better" at this time. Denies fevers, cough, chest pain, lightheadedness, or chills. While his oxygen has improved with increased oxygen, this does not help [place] determine underlying cause. My nursing staff and I had a lengthy discussion about his condition. We discussed limitations with certain imaging such as chest xrays. He knows our strong recommendation would be ER visit to assess for underlying cause of hypoxia such as infection, COPD exacerbation, drug toxicity, PE, etc. The patient respectfully declined. The patient verbalized understanding with the risks associated with his condition which can cause significant morbidity and mortality.  We will arrange for CXR. The patient knows if he develops new or worsening symptoms, to seek ER evaluation.

## 2022-05-10 NOTE — Telephone Encounter (Signed)
This nurse reached out to patient and spoke with his wife.  Made her aware of the chest xray results of possible Pneumonia or Edema around his right lung.  Also advised that the provider strongly recommends patient going to the Emergency room. She acknowledges understanding and states that she will cal  EMS to transport him.  No further questions or concerns noted at this time.

## 2022-05-10 NOTE — ED Triage Notes (Signed)
Pt BIB [facility] for increased SOB.  Has had to increase his 2L 02 baseline to 3L. Recent Chest xray shows PNA.  PT is afebrile.  PT has hx of lung cancer and is currently being treated.  Has had left lung removed. Hx of afib. On eliquis but hasn't had it in about a week. Per EMs he was recently switched to Plavix.   EMS gave 434mL NS through an 18G IV  EMS vitals 112/46 97% 3L [place] RR-20, 97.5%

## 2022-05-10 NOTE — ED Provider Notes (Signed)
[facility] EMERGENCY DEPARTMENT Provider Note   CSN: [id number] Arrival date & time: 05/10/22  1757     History  No chief complaint on file.   Benjamin Case is a 66 y.o. male who presents urgency department with acute on chronic respiratory failure.  Patient has a history of metastatic stage IV lung cancer.  He is currently under treatment with [facility].  Patient has had progressively worsening shortness of breath for the past several days.  Patient was recently hospitalized with pneumonia and has been newly diagnosed with atrial fibrillation.  He was on Eliquis but stopped taking it about a week ago after he had GI blood loss from anticoagulation.  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  The patient normally is on about 2 L of oxygen but has had to bump his oxygen up to 4 L because this morning he was at 81% on his normal 2.5 L.  Patient has not taken any anticoagulation last several days.  He has not been running fevers or chills.  He is scheduled to begin taking Plavix tomorrow for his embolic strokes.  MS reports that he was sent in for his hypoxic respiratory and because he had an outpatient x-ray and was called to come in for current pneumonia.  Impression of the x-ray from today is as follows "IMPRESSION: There is significant interval increase in interstitial markings in right lung suggesting interstitial pneumonia or interstitial edema. Status post left pneumonectomy. Small right pleural effusion. There is 1.5 cm nodule in right apical region with no significant change."  HPI     Home Medications Prior to Admission medications   Medication Sig Start Date End Date Taking? Authorizing Provider  ALPRAZolam Duanne Moron) 1 MG tablet Take 1 mg by mouth 3 (three) times daily as needed for anxiety. 04/20/15   [provider]  Ascorbic Acid (VITAMIN C) 1000 MG tablet Take  1,000 mg by mouth daily.    [provider]  aspirin EC 81 MG tablet Take 81 mg by mouth daily.    [provider]  Aspirin-Caffeine (BC FAST PAIN RELIEF) 845-65 MG PACK Take 1 packet by mouth daily as needed (pain.).    [provider]  atorvastatin (LIPITOR) 10 MG tablet Take 10 mg by mouth at bedtime.     [provider]  B Complex-C (SUPER B COMPLEX PO) Take 1 tablet by mouth daily.    [provider]  clopidogrel (PLAVIX) 75 MG tablet Take 1 tablet (75 mg total) by mouth daily. 05/10/22   Ventura Sellers, MD  diltiazem (CARDIZEM SR) 60 MG 12 hr capsule Take 60 mg by mouth 2 (two) times daily.    [provider]  esomeprazole (NEXIUM) 40 MG capsule Take 40 mg by mouth daily as needed (for heartburn).     [provider]  folic acid (FOLVITE) 1 MG tablet Take 1 tablet (1 mg total) by mouth daily. 04/18/22   Shawna Clamp, MD  guaiFENesin (MUCINEX) 600 MG 12 hr tablet Take 600 mg by mouth 2 (two) times daily.    [provider]  ipratropium-albuterol (DUONEB) 0.5-2.5 (3) MG/3ML SOLN Take 3 mLs by nebulization 2 (two) times daily.    [provider]  lidocaine-prilocaine (EMLA) cream Apply 1 Application topically as needed. Patient not taking: Reported on 05/10/2022 05/06/22   Heilingoetter, Cassandra L, PA-C  meloxicam (MOBIC) 15 MG tablet Take 15  mg by mouth daily. 05/04/22   [provider]  methocarbamol (ROBAXIN) 500 MG tablet Take 500 mg by mouth 2 (two) times daily as needed for muscle spasms. 03/12/22   [provider]  metoprolol succinate (TOPROL XL) 25 MG 24 hr tablet Take 1 tablet (25 mg total) by mouth daily. Patient not taking: Reported on 05/10/2022 04/27/22   Loel Dubonnet, NP  Omega-3 Fatty Acids ([employer] FISH OIL) 1000 MG CAPS Take 1,000 mg by mouth every evening.    [provider]  oxyCODONE-acetaminophen (PERCOCET) 10-325 MG tablet Take 1 tablet by mouth 5 (five) times  daily as needed for pain. 05/04/21   [provider]  pregabalin (LYRICA) 75 MG capsule Take 75 mg by mouth 2 (two) times daily. 02/22/22   [provider]  prochlorperazine (COMPAZINE) 10 MG tablet Take 1 tablet (10 mg total) by mouth every 6 (six) hours as needed for nausea or vomiting. 04/01/22   Curt Bears, MD  thiamine (VITAMIN B-1) 100 MG tablet Take 1 tablet (100 mg total) by mouth daily. 04/18/22   Shawna Clamp, MD  thiamine (VITAMIN B1) 100 MG tablet Take 100 mg by mouth daily. 04/17/22   [provider]  vitamin B-12 (CYANOCOBALAMIN) 1000 MCG tablet Take 1,000 mcg by mouth daily.    [provider]      Allergies    Patient has no known allergies.    Review of Systems   Review of Systems  Physical Exam Updated Vital Signs BP (!) 116/56   Pulse 61   Temp 98 F (36.7 C) (Oral)   Resp 16   SpO2 93%  Physical Exam Vitals and nursing note reviewed.  Constitutional:      General: He is not in acute distress.    Appearance: He is well-developed. He is not diaphoretic.     Interventions: Nasal cannula in place.  HENT:     Head: Normocephalic and atraumatic.  Eyes:     General: No scleral icterus.    Conjunctiva/sclera: Conjunctivae normal.  Cardiovascular:     Rate and Rhythm: Normal rate and regular rhythm.     Heart sounds: Normal heart sounds.  Pulmonary:     Effort: Pulmonary effort is normal. No respiratory distress.     Breath sounds: Decreased air movement present. Examination of the right-middle field reveals wheezing. Examination of the left-middle field reveals wheezing. Examination of the right-lower field reveals decreased breath sounds. Examination of the left-lower field reveals decreased breath sounds. Decreased breath sounds and wheezing present.  Abdominal:     Palpations: Abdomen is soft.     Tenderness: There is no abdominal tenderness.  Musculoskeletal:     Cervical back: Normal range of motion and neck  supple.  Skin:    General: Skin is warm and dry.  Neurological:     Mental Status: He is alert.  Psychiatric:        Behavior: Behavior normal.     ED Results / Procedures / Treatments   Labs (all labs ordered are listed, but only abnormal results are displayed) Labs Reviewed  CBC WITH DIFFERENTIAL/PLATELET - Abnormal; Notable for the following components:      Result Value   WBC 50.5 (*)    RBC 2.83 (*)    Hemoglobin 9.8 (*)    HCT 27.9 (*)    MCH 34.6 (*)    Neutro Abs 37.1 (*)    Abs Immature Granulocytes 11.60 (*)    All other components within  normal limits  BRAIN NATRIURETIC PEPTIDE - Abnormal; Notable for the following components:   B Natriuretic Peptide 300.4 (*)    All other components within normal limits  COMPREHENSIVE METABOLIC PANEL - Abnormal; Notable for the following components:   Sodium 130 (*)    Chloride 96 (*)    Glucose, Bld 136 (*)    Calcium 7.7 (*)    Total Protein 5.8 (*)    Albumin 2.8 (*)    All other components within normal limits  MAGNESIUM - Abnormal; Notable for the following components:   Magnesium 1.0 (*)    All other components within normal limits  CBC WITH DIFFERENTIAL/PLATELET  BRAIN NATRIURETIC PEPTIDE    EKG None  Radiology CT Angio Chest PE W and/or Wo Contrast  Result Date: 05/10/2022 CLINICAL DATA:  Pulmonary embolism suspected, high probability. Increasing shortness of breath. History of lung cancer and currently being treated. Left lung removed. EXAM: CT ANGIOGRAPHY CHEST WITH CONTRAST TECHNIQUE: Multidetector CT imaging of the chest was performed using the standard protocol during bolus administration of intravenous contrast. Multiplanar CT image reconstructions and MIPs were obtained to evaluate the vascular anatomy. RADIATION DOSE REDUCTION: This exam was performed according to the departmental dose-optimization program which includes automated exposure control, adjustment of the mA and/or kV according to patient size  and/or use of iterative reconstruction technique. CONTRAST:  38mL OMNIPAQUE IOHEXOL 350 MG/ML SOLN COMPARISON:  04/11/2022. FINDINGS: Cardiovascular: Heart is enlarged and there is a small pericardial effusion. Multi-vessel coronary artery calcifications are noted. There is atherosclerotic calcification of the aorta without evidence aneurysm. Pulmonary trunk is distended suggesting underlying pulmonary artery hypertension. No evidence of pulmonary embolism. Evaluation of the subsegmental arteries is limited. Mediastinum/Nodes: A conglomeration of enlarged lymph nodes are present in the mediastinum and right hilum, measuring up to 0 4.6 x 2.5 cm in the precarinal space versus 2.8 x 4.2 cm on the previous exam. No axillary lymphadenopathy. The thyroid gland, trachea, and esophagus are within normal limits. Lungs/Pleura: Status post left pneumonectomy with chronic fibrothorax. There is a small right pleural effusion. Hazy attenuation is noted in the right lung. Multiple pulmonary nodules are seen in the right lung. An azygous lobe is noted. Patchy infiltrates are noted in the posterior aspect of the right upper lobe and right middle lobe. No pneumothorax. Upper Abdomen: Multiple ill-defined hypodensities are present in the liver measuring up to 2.4 cm. There is a left adrenal nodule measuring 2.1 cm, previously characterized as an adenoma. There is nodular thickening of the right adrenal gland. Scattered nodules are noted in the retroperitoneum bilaterally. Musculoskeletal: Scattered lucencies are noted in the bones, compatible with known metastatic disease and increased in size and number from the previous exam. There is a stable compression deformity in the superior endplate at T9. Stable postsurgical changes in the left ribs. Stable fracture in the T9 rib on the left. Stable old healed rib fractures on the right. Review of the MIP images confirms the above findings. IMPRESSION: 1. No evidence of pulmonary embolism.  2. Hazy attenuation in the right lung with patchy infiltrates in the right upper and middle lobes. 3. Small right pleural effusion. 4. Multiple pulmonary nodules in the right lung with mediastinal and right hilar lymphadenopathy, possible metastasis. 5. Multiple hepatic lesions and scattered nodules in the retroperitoneum bilaterally, concerning for metastatic disease. 6. Stable postsurgical changes of left pneumonectomy. 7. Increasing lucencies in the bones, compatible with known metastatic disease. Stable rib fracture at T9 on the left. 8. Aortic  atherosclerosis. Electronically Signed   By: Brett Fairy M.D.   On: 05/10/2022 20:24   DG Chest 2 View  Result Date: 05/10/2022 CLINICAL DATA:  Shortness of breath, lung carcinoma EXAM: CHEST - 2 VIEW COMPARISON:  Previous studies including the examination of 04/28/2022 FINDINGS: There is previous left pneumonectomy. There is a interval increase in interstitial markings throughout the right lung. There is 1.5 cm nodular density in the right apical region with no significant change. Azygous fissure is seen in right upper lung field. There is minimal blunting of right lateral CP angle. IMPRESSION: There is significant interval increase in interstitial markings in right lung suggesting interstitial pneumonia or interstitial edema. Status post left pneumonectomy. Small right pleural effusion. There is 1.5 cm nodule in right apical region with no significant change. Electronically Signed   By: Elmer Picker M.D.   On: 05/10/2022 15:48    Procedures .Critical Care  Performed by: Margarita Mail, PA-C Authorized by: Margarita Mail, PA-C   Critical care provider statement:    Critical care time (minutes):  45   Critical care time was exclusive of:  Separately billable procedures and treating other patients   Critical care was necessary to treat or prevent imminent or life-threatening deterioration of the following conditions:  Respiratory failure    Critical care was time spent personally by me on the following activities:  Development of treatment plan with patient or surrogate, discussions with consultants, evaluation of patient's response to treatment, examination of patient, ordering and review of laboratory studies, ordering and review of radiographic studies, ordering and performing treatments and interventions, pulse oximetry, re-evaluation of patient's condition, review of old charts, interpretation of cardiac output measurements and obtaining history from patient or surrogate     Medications Ordered in ED Medications  ceFEPIme (MAXIPIME) 1 g in sodium chloride 0.9 % 100 mL IVPB (has no administration in time range)  magnesium sulfate IVPB 2 g 50 mL (0 g Intravenous Stopped 05/10/22 2055)  iohexol (OMNIPAQUE) 350 MG/ML injection 75 mL (75 mLs Intravenous Contrast Given 05/10/22 1949)    ED Course/ Medical Decision Making/ A&P Clinical Course as of 05/10/22 2112  Mon May 10, 2022  1934 Magnesium(!): 1.0 I ordered IV magnesium for repletion [AH]  1934 WBC(!!): 50.5 Patient's white blood cell count is 50,000.  Patient had a Neulasta shot 2 days ago [AH]  2035 Glucose(!): 136 [AH]  2036 CT Angio Chest PE W and/or Wo Contrast I visualized and interpreted CT angiogram which shows pneumonia-. Hazy attenuation in the right lung with patchy infiltrates in the right upper and middle lobes.   [AH]  2036 CBC with Differential(!!) [AH]  2111 ED EKG A EKG shows atrial flutter rate about 130 [AH]    Clinical Course User Index [AH] Margarita Mail, PA-C                           Medical Decision Making This patient presents to the ED for concern of shortness of breath, this involves an extensive number of treatment options, and is a complaint that carries with it a high risk of complications and morbidity.  The emergent differential diagnosis for shortness of breath includes, but is not limited to, Pulmonary edema, bronchoconstriction,  Pneumonia, Pulmonary embolism, Pneumotherax/ Hemothorax, Dysrythmia, ACS.        Co morbidities that complicate the patient evaluation       Metastatic cancer, a flutter not on anticoagulation, history of GI bleeds  Additional history obtained:  Wife at bedside, review of EMR    Lab Tests:  I Ordered, and personally interpreted labs.  The pertinent results include: As per ED course   Imaging Studies ordered:  As per ED course  Cardiac Monitoring/ECG:       The patient was maintained on a cardiac monitor.  I personally viewed and interpreted the cardiac monitored which showed an underlying rhythm of: Atrial flutter      Medicines ordered and prescription drug management:  I ordered medication including Medications vancomycin (VANCOREADY) IVPB 1500 mg/300 mL (has no administration in time range) enoxaparin (LOVENOX) injection 40 mg (has no administration in time range) magnesium sulfate IVPB 4 g 100 mL (has no administration in time range) ALPRAZolam (XANAX) tablet 1 mg (1 mg Oral Given 05/10/22 2244) vancomycin (VANCOREADY) IVPB 1500 mg/300 mL (has no administration in time range) ceFEPIme (MAXIPIME) 2 g in sodium chloride 0.9 % 100 mL IVPB (has no administration in time range) magnesium sulfate IVPB 2 g 50 mL (0 g Intravenous Stopped 05/10/22 2055) iohexol (OMNIPAQUE) 350 MG/ML injection 75 mL (75 mLs Intravenous Contrast Given 05/10/22 1949) ceFEPIme (MAXIPIME) 1 g in sodium chloride 0.9 % 100 mL IVPB (1 g Intravenous New Bag/Given 05/10/22 2248) for hospital-acquired pneumonia Reevaluation of the patient after these medicines showed that the patient stayed the same I have reviewed the patients home medicines and have made adjustments as needed   Test Considered:          Critical Interventions:       Oxygen supplementation, antibiotics   Consultations Obtained:   {I discussed the case with Dr. Nevada Crane, they will admit   Problem List / ED  Course:       (J18.9,  Y95) HAP (hospital-acquired pneumonia)  (primary encounter diagnosis)  (J96.21) Acute on chronic respiratory failure with hypoxia ([place])     Reevaluation:  After the interventions noted above, I reevaluated the patient and found that they have :stayed the same   Social Determinants of Health:       strong family support   Dispostion:  After consideration of the diagnostic results and the patients response to treatment, I feel that the patent would benefit from admission.    Amount and/or Complexity of Data Reviewed Labs: ordered. Decision-making details documented in ED Course. Radiology: ordered and independent interpretation performed. Decision-making details documented in ED Course. ECG/medicine tests: ordered. Decision-making details documented in ED Course.  Risk Prescription drug management. Decision regarding hospitalization.           Final Clinical Impression(s) / ED Diagnoses Final diagnoses:  HAP (hospital-acquired pneumonia)  Acute on chronic respiratory failure with hypoxia [facility])    Rx / DC Orders ED Discharge Orders     None         Margarita Mail, PA-C 05/10/22 [place], [place], DO 05/13/22 [phone]

## 2022-05-11 ENCOUNTER — Other Ambulatory Visit: Payer: Self-pay

## 2022-05-11 ENCOUNTER — Telehealth: Payer: Self-pay | Admitting: Internal Medicine

## 2022-05-11 DIAGNOSIS — Y95 Nosocomial condition: Secondary | ICD-10-CM

## 2022-05-11 DIAGNOSIS — J9621 Acute and chronic respiratory failure with hypoxia: Secondary | ICD-10-CM | POA: Diagnosis not present

## 2022-05-11 DIAGNOSIS — Z7189 Other specified counseling: Secondary | ICD-10-CM | POA: Diagnosis not present

## 2022-05-11 DIAGNOSIS — J189 Pneumonia, unspecified organism: Secondary | ICD-10-CM | POA: Diagnosis not present

## 2022-05-11 LAB — COMPREHENSIVE METABOLIC PANEL
ALT: 25 U/L (ref 0–44)
AST: 25 U/L (ref 15–41)
Albumin: 2.7 g/dL — ABNORMAL LOW (ref 3.5–5.0)
Alkaline Phosphatase: 103 U/L (ref 38–126)
Anion gap: 9 (ref 5–15)
BUN: 19 mg/dL (ref 8–23)
CO2: 27 mmol/L (ref 22–32)
Calcium: 8 mg/dL — ABNORMAL LOW (ref 8.9–10.3)
Chloride: 96 mmol/L — ABNORMAL LOW (ref 98–111)
Creatinine, Ser: 1.16 mg/dL (ref 0.61–1.24)
GFR, Estimated: >60 mL/min (ref >60)
Glucose, Bld: 146 mg/dL — ABNORMAL HIGH (ref 70–99)
Potassium: 4.1 mmol/L (ref 3.5–5.1)
Sodium: 132 mmol/L — ABNORMAL LOW (ref 135–145)
Total Bilirubin: 0.4 mg/dL (ref 0.3–1.2)
Total Protein: 5.6 g/dL — ABNORMAL LOW (ref 6.5–8.1)

## 2022-05-11 LAB — CBC WITH DIFFERENTIAL/PLATELET
Abs Granulocyte: 32.4 10*3/uL — ABNORMAL HIGH (ref 1.5–6.5)
Abs Immature Granulocytes: 10.62 10*3/uL — ABNORMAL HIGH (ref 0.00–0.07)
Basophils Absolute: 0.1 10*3/uL (ref 0.0–0.1)
Basophils Relative: 0 %
Eosinophils Absolute: 0.3 10*3/uL (ref 0.0–0.5)
Eosinophils Relative: 1 %
HCT: 27.5 % — ABNORMAL LOW (ref 39.0–52.0)
Hemoglobin: 9.6 g/dL — ABNORMAL LOW (ref 13.0–17.0)
Immature Granulocytes: 0 %
Lymphocytes Relative: 2 %
Lymphs Abs: 0.7 10*3/uL (ref 0.7–4.0)
MCH: 34.8 pg — ABNORMAL HIGH (ref 26.0–34.0)
MCHC: 34.9 g/dL (ref 30.0–36.0)
MCV: 99.6 fL (ref 80.0–100.0)
Monocytes Absolute: 0.5 10*3/uL (ref 0.1–1.0)
Monocytes Relative: 1 %
Neutro Abs: 32.4 10*3/uL — ABNORMAL HIGH (ref 1.7–7.7)
Neutrophils Relative %: 96 %
Platelets: 247 10*3/uL (ref 150–400)
RBC: 2.76 MIL/uL — ABNORMAL LOW (ref 4.22–5.81)
RDW: 13.1 % (ref 11.5–15.5)
WBC: 44.5 10*3/uL — ABNORMAL HIGH (ref 4.0–10.5)
nRBC: 0 % (ref 0.0–0.2)

## 2022-05-11 LAB — PROCALCITONIN: Procalcitonin: 0.82 ng/mL

## 2022-05-11 LAB — C-REACTIVE PROTEIN: CRP: 8.6 mg/dL — ABNORMAL HIGH (ref <1.0)

## 2022-05-11 LAB — BRAIN NATRIURETIC PEPTIDE: B Natriuretic Peptide: 314.6 pg/mL — ABNORMAL HIGH (ref 0.0–100.0)

## 2022-05-11 LAB — MRSA NEXT GEN BY PCR, NASAL: MRSA by PCR Next Gen: NOT DETECTED

## 2022-05-11 LAB — MAGNESIUM: Magnesium: 2.7 mg/dL — ABNORMAL HIGH (ref 1.7–2.4)

## 2022-05-11 LAB — PHOSPHORUS: Phosphorus: 3.6 mg/dL (ref 2.5–4.6)

## 2022-05-11 MED ORDER — FUROSEMIDE 10 MG/ML IJ SOLN
40.0000 mg | Freq: Once | INTRAMUSCULAR | Status: AC
  Administered 2022-05-11: 40 mg via INTRAVENOUS
  Filled 2022-05-11: qty 4

## 2022-05-11 MED ORDER — VITAMIN B-12 1000 MCG PO TABS
1000.0000 ug | ORAL_TABLET | Freq: Every day | ORAL | Status: DC
  Administered 2022-05-11 – 2022-05-14 (×4): 1000 ug via ORAL
  Filled 2022-05-11 (×4): qty 1

## 2022-05-11 MED ORDER — PANTOPRAZOLE SODIUM 40 MG PO TBEC
40.0000 mg | DELAYED_RELEASE_TABLET | Freq: Every day | ORAL | Status: DC
  Administered 2022-05-11 – 2022-05-14 (×4): 40 mg via ORAL
  Filled 2022-05-11 (×4): qty 1

## 2022-05-11 MED ORDER — MIDODRINE HCL 5 MG PO TABS
5.0000 mg | ORAL_TABLET | Freq: Three times a day (TID) | ORAL | Status: DC
  Administered 2022-05-11: 5 mg via ORAL
  Filled 2022-05-11: qty 1

## 2022-05-11 MED ORDER — LORATADINE 10 MG PO TABS
10.0000 mg | ORAL_TABLET | Freq: Every day | ORAL | Status: DC
  Administered 2022-05-11 – 2022-05-14 (×4): 10 mg via ORAL
  Filled 2022-05-11 (×4): qty 1

## 2022-05-11 MED ORDER — OXYCODONE HCL 5 MG PO TABS
5.0000 mg | ORAL_TABLET | Freq: Every day | ORAL | Status: DC | PRN
  Administered 2022-05-11 – 2022-05-12 (×5): 5 mg via ORAL
  Filled 2022-05-11 (×5): qty 1

## 2022-05-11 MED ORDER — FOLIC ACID 1 MG PO TABS
1.0000 mg | ORAL_TABLET | Freq: Every day | ORAL | Status: DC
  Administered 2022-05-11 – 2022-05-14 (×4): 1 mg via ORAL
  Filled 2022-05-11 (×4): qty 1

## 2022-05-11 MED ORDER — MIDODRINE HCL 5 MG PO TABS: 10.0000 mg | ORAL_TABLET | ORAL | Status: DC

## 2022-05-11 MED ORDER — CLOPIDOGREL BISULFATE 75 MG PO TABS
75.0000 mg | ORAL_TABLET | Freq: Every day | ORAL | Status: DC
  Administered 2022-05-11 – 2022-05-14 (×4): 75 mg via ORAL
  Filled 2022-05-11 (×4): qty 1

## 2022-05-11 MED ORDER — IPRATROPIUM-ALBUTEROL 0.5-2.5 (3) MG/3ML IN SOLN
3.0000 mL | Freq: Four times a day (QID) | RESPIRATORY_TRACT | Status: DC
  Administered 2022-05-11 – 2022-05-12 (×8): 3 mL via RESPIRATORY_TRACT
  Filled 2022-05-11 (×8): qty 3

## 2022-05-11 MED ORDER — POLYETHYLENE GLYCOL 3350 17 G PO PACK: 17.0000 g | PACK | Freq: Every day | ORAL | Status: DC | PRN

## 2022-05-11 MED ORDER — OXYCODONE-ACETAMINOPHEN 10-325 MG PO TABS: 1.0000 | ORAL_TABLET | Freq: Every day | ORAL | Status: DC | PRN

## 2022-05-11 MED ORDER — ATORVASTATIN CALCIUM 10 MG PO TABS
10.0000 mg | ORAL_TABLET | Freq: Every day | ORAL | Status: DC
  Administered 2022-05-11 – 2022-05-13 (×4): 10 mg via ORAL
  Filled 2022-05-11 (×4): qty 1

## 2022-05-11 MED ORDER — OXYCODONE-ACETAMINOPHEN 5-325 MG PO TABS
1.0000 | ORAL_TABLET | Freq: Every day | ORAL | Status: DC | PRN
  Administered 2022-05-11 – 2022-05-12 (×5): 1 via ORAL
  Filled 2022-05-11 (×5): qty 1

## 2022-05-11 MED ORDER — METOPROLOL TARTRATE 25 MG PO TABS
25.0000 mg | ORAL_TABLET | Freq: Two times a day (BID) | ORAL | Status: DC
  Administered 2022-05-11: 25 mg via ORAL
  Filled 2022-05-11 (×2): qty 1

## 2022-05-11 MED ORDER — ACETAMINOPHEN 325 MG PO TABS
650.0000 mg | ORAL_TABLET | Freq: Four times a day (QID) | ORAL | Status: DC | PRN
  Administered 2022-05-11: 650 mg via ORAL
  Filled 2022-05-11: qty 2

## 2022-05-11 MED ORDER — ASPIRIN 81 MG PO TBEC
81.0000 mg | DELAYED_RELEASE_TABLET | Freq: Every day | ORAL | Status: DC
  Administered 2022-05-11 – 2022-05-14 (×4): 81 mg via ORAL
  Filled 2022-05-11 (×4): qty 1

## 2022-05-11 MED ORDER — CVS FISH OIL 1000 MG PO CAPS: 1000.0000 mg | ORAL_CAPSULE | Freq: Every evening | ORAL | Status: DC

## 2022-05-11 MED ORDER — SENNOSIDES-DOCUSATE SODIUM 8.6-50 MG PO TABS
1.0000 | ORAL_TABLET | Freq: Every day | ORAL | Status: DC
  Administered 2022-05-11 – 2022-05-13 (×3): 1 via ORAL
  Filled 2022-05-11 (×3): qty 1

## 2022-05-11 MED ORDER — PREGABALIN 75 MG PO CAPS
75.0000 mg | ORAL_CAPSULE | Freq: Two times a day (BID) | ORAL | Status: DC
  Administered 2022-05-11 – 2022-05-14 (×7): 75 mg via ORAL
  Filled 2022-05-11 (×2): qty 3
  Filled 2022-05-11 (×5): qty 1

## 2022-05-11 MED ORDER — MIDODRINE HCL 5 MG PO TABS
10.0000 mg | ORAL_TABLET | Freq: Three times a day (TID) | ORAL | Status: DC
  Administered 2022-05-11 – 2022-05-14 (×9): 10 mg via ORAL
  Filled 2022-05-11 (×9): qty 2

## 2022-05-11 MED ORDER — THIAMINE MONONITRATE 100 MG PO TABS
100.0000 mg | ORAL_TABLET | Freq: Every day | ORAL | Status: DC
  Administered 2022-05-11 – 2022-05-14 (×4): 100 mg via ORAL
  Filled 2022-05-11 (×4): qty 1

## 2022-05-11 MED ORDER — GUAIFENESIN ER 600 MG PO TB12
600.0000 mg | ORAL_TABLET | Freq: Two times a day (BID) | ORAL | Status: DC
  Administered 2022-05-11 – 2022-05-14 (×8): 600 mg via ORAL
  Filled 2022-05-11 (×8): qty 1

## 2022-05-11 MED ORDER — METOPROLOL TARTRATE 25 MG PO TABS: 50.0000 mg | ORAL_TABLET | Freq: Two times a day (BID) | ORAL | Status: DC

## 2022-05-11 MED ORDER — LEVOFLOXACIN 750 MG PO TABS
750.0000 mg | ORAL_TABLET | Freq: Every day | ORAL | Status: AC
  Administered 2022-05-11 – 2022-05-13 (×3): 750 mg via ORAL
  Filled 2022-05-11 (×3): qty 1

## 2022-05-11 MED ORDER — PROCHLORPERAZINE EDISYLATE 10 MG/2ML IJ SOLN: 5.0000 mg | Freq: Four times a day (QID) | INTRAMUSCULAR | Status: DC | PRN

## 2022-05-11 NOTE — ED Notes (Signed)
Floor RN states ready to receive patient at this time.

## 2022-05-11 NOTE — Progress Notes (Signed)
PROGRESS NOTE                                                                                                                                                                                                             Patient Demographics:    Benjamin Case, is a 66 y.o. male, DOB - [birth year], GSU:[id number]  Outpatient Primary MD for the patient is [name], MD    LOS - 1  Admit date - 05/10/2022    No chief complaint on file.      Brief Narrative (HPI from H&P)   66 y.o. male with medical history significant for Stage IV metastatic lung cancer, paroxysmal A-fib previously on Eliquis, stopped taking the DOAC on 05/02/22 after GI bleed, CVAs saw medical neurologist [name] on 05/10/2022 and was placed on dual antiplatelet therapy, chronic hypoxia on 2 L nasal cannula continuously, currently hospitalized for pneumonia, he now presents with progressive shortness of breath, workup in the ER suggestive of metastatic lung cancer with possibility of reoccurrence of pneumonia.  He was admitted to the hospital for further care.     Subjective:    Benjamin Case today has, No headache, No chest pain, No abdominal pain - No Nausea, No new weakness tingling or numbness, +ve SOB.   Assessment  & Plan :   Acute on chronic hypoxic respiratory failure in a patient with metastatic lung cancer recent admission for pneumonia underlying COPD and ongoing smoking. He will be admitted to the hospital, continue supportive care with oxygen and nebulizer treatments, he is MRSA negative on his nasal PCR, also has elevated BNP.  CTA negative for PE.  I think his presentation is a mixture of possible continued pneumonia along with some element of CHF, also has metastatic stage IV lung cancer.  He will be continued on antibiotics today will be titrated down as he is MRSA negative, will gently diurese.  Monitor closely long-term prognosis is extremely  guarded.   Acute on chronic diastolic CHF EF 85%.  Diuresis as tolerated.  Beta-blocker.    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  Supportive care under the care of [name], long-term prognosis again extremely poor.  Palliative care involved.  Paroxysmal atrial fibrillation with [place] vas 2 score of  5.  placed on beta-blocker for rate control, anticoagulation was discontinued due to GI bleed.  Continue to monitor.  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  Has seen [name], on dual antiplatelet therapy and statin for secondary prevention.  Chronic alcohol abuse and smoking.  Counseled to quit both.  Monitor for DTs.  Per patient and wife no alcohol in the last 30 days.  COPD.  Supportive care.  On home oxygen.  GERD and recent GI bleed.  On PPI.  Leukocytosis.  Somewhat chronic.  Ongoing chemotherapy, getting Neulasta.         Condition - Extremely Guarded  Family Communication  :  wife Vaughan Basta [phone]  on 05/11/22  Code Status :  Full  Consults  :  Pall care  PUD Prophylaxis :    Procedures  :     CTA - 1. No evidence of pulmonary embolism. 2. Hazy attenuation in the right lung with patchy infiltrates in the right upper and middle lobes. 3. Small right pleural effusion. 4. Multiple pulmonary nodules in the right lung with mediastinal and right hilar lymphadenopathy, possible metastasis. 5. Multiple hepatic lesions and scattered nodules in the retroperitoneum bilaterally, concerning for metastatic disease. 6. Stable postsurgical changes of left pneumonectomy. 7. Increasing lucencies in the bones, compatible with known metastatic disease. Stable rib fracture at T9 on the left. 8. Aortic atherosclerosis.       Disposition Plan  :    Status is: Inpatient  DVT Prophylaxis  :    enoxaparin  (LOVENOX) injection 40 mg Start: 05/11/22 1000    Lab Results  Component Value Date   PLT 247 05/11/2022    Diet :  Diet Order             DIET SOFT Room service appropriate? Yes; Fluid consistency: Thin  Diet effective now                    Inpatient Medications  Scheduled Meds:  aspirin EC  81 mg Oral Daily   atorvastatin  10 mg Oral QHS   clopidogrel  75 mg Oral Daily   cyanocobalamin  1,000 mcg Oral Daily   enoxaparin (LOVENOX) injection  40 mg Subcutaneous U98J   folic acid  1 mg Oral Daily   guaiFENesin  600 mg Oral BID   ipratropium-albuterol  3 mL Nebulization Q6H   metoprolol tartrate  25 mg Oral BID   midodrine  5 mg Oral TID WC   pantoprazole  40 mg Oral Daily   pregabalin  75 mg Oral BID   senna-docusate  1 tablet Oral QHS   thiamine  100 mg Oral Daily   Continuous Infusions:  ceFEPime (MAXIPIME) IV Stopped (05/11/22 0557)   vancomycin     PRN Meds:.acetaminophen, ALPRAZolam, oxyCODONE-acetaminophen **AND** oxyCODONE, polyethylene glycol, prochlorperazine   Objective:   Vitals:   05/11/22 0600 05/11/22 0630 05/11/22 0700 05/11/22 0805  BP: (!) 101/53 118/67 112/66 117/68  Pulse: (!) 107 (!) 103 98 95  Resp: 18 (!) 25 (!) 27 18  Temp:      TempSrc:      SpO2: 94% 95% 96% 98%    Wt Readings from Last 3 Encounters:  05/10/22 78.7 kg  05/07/22 78.7 kg  05/05/22 80.7 kg     Intake/Output Summary (Last 24 hours) at 05/11/2022 0854 Last data filed at 05/11/2022 0557 Gross per 24 hour  Intake 146.04 ml  Output --  Net 146.04 ml     Physical Exam  Awake Alert, No new F.N deficits, Normal affect Early.AT,PERRAL Supple Neck, No JVD,   Symmetrical Chest wall movement, Good air movement bilaterally, coarse B sounds RRR,No Gallops,Rubs or new Murmurs,  +ve B.Sounds, Abd Soft, No tenderness,   No Cyanosis, Clubbing or edema        Data Review:    Recent Labs  Lab 05/05/22 1436 05/10/22 1830 05/11/22 0552  WBC 15.9* 50.5* 44.5*  HGB  12.5* 9.8* 9.6*  HCT 36.7* 27.9* 27.5*  PLT 423* 262 247  MCV 98.4 98.6 99.6  MCH 33.5 34.6* 34.8*  MCHC 34.1 35.1 34.9  RDW 13.2 13.2 13.1  LYMPHSABS 2.6 1.3 0.7  MONOABS 1.6* 0.3 PENDING  EOSABS 0.8* 0.1 0.3  BASOSABS 0.1 0.1 0.1    Recent Labs  Lab 05/05/22 1436 05/10/22 1830 05/11/22 0340 05/11/22 0552 05/11/22 0557  NA 135 130* 132*  --   --   K 4.5 4.0 4.1  --   --   CL 94* 96* 96*  --   --   CO2 28 24 27   --   --   GLUCOSE 108* 136* 146*  --   --   BUN 24* 19 19  --   --   CREATININE 1.31* 1.18 1.16  --   --   AST 28 26 25   --   --   ALT 56* 27 25  --   --   ALKPHOS 75 96 103  --   --   BILITOT 0.6 0.6 0.4  --   --   ALBUMIN 4.0 2.8* 2.7*  --   --   CRP  --   --   --  8.6*  --   PROCALCITON  --   --   --  0.82  --   BNP  --  300.4*  --   --  314.6*  MG  --  1.0* 2.7*  --   --   CALCIUM 8.9 7.7* 8.0*  --   --    Radiology Reports CT Angio Chest PE W and/or Wo Contrast  Result Date: 05/10/2022 CLINICAL DATA:  Pulmonary embolism suspected, high probability. Increasing shortness of breath. History of lung cancer and currently being treated. Left lung removed. EXAM: CT ANGIOGRAPHY CHEST WITH CONTRAST TECHNIQUE: Multidetector CT imaging of the chest was performed using the standard protocol during bolus administration of intravenous contrast. Multiplanar CT image reconstructions and MIPs were obtained to evaluate the vascular anatomy. RADIATION DOSE REDUCTION: This exam was performed according to the departmental dose-optimization program which includes automated exposure control, adjustment of the mA and/or kV according to patient size and/or use of iterative reconstruction technique. CONTRAST:  70mL OMNIPAQUE IOHEXOL 350 MG/ML SOLN COMPARISON:  04/11/2022. FINDINGS: Cardiovascular: Heart is enlarged and there is a small pericardial effusion. Multi-vessel coronary artery calcifications are noted. There is atherosclerotic calcification of the aorta without evidence aneurysm.  Pulmonary trunk is distended suggesting underlying pulmonary artery hypertension. No evidence of pulmonary embolism. Evaluation of the subsegmental arteries is limited. Mediastinum/Nodes: A conglomeration of enlarged lymph nodes are present in the mediastinum and right hilum, measuring up to 0 4.6 x 2.5 cm in the precarinal space versus 2.8 x 4.2 cm on the previous exam. No axillary lymphadenopathy. The thyroid gland, trachea, and esophagus are within normal limits. Lungs/Pleura: Status post left pneumonectomy with chronic fibrothorax. There is a small right pleural effusion. Hazy attenuation is  noted in the right lung. Multiple pulmonary nodules are seen in the right lung. An azygous lobe is noted. Patchy infiltrates are noted in the posterior aspect of the right upper lobe and right middle lobe. No pneumothorax. Upper Abdomen: Multiple ill-defined hypodensities are present in the liver measuring up to 2.4 cm. There is a left adrenal nodule measuring 2.1 cm, previously characterized as an adenoma. There is nodular thickening of the right adrenal gland. Scattered nodules are noted in the retroperitoneum bilaterally. Musculoskeletal: Scattered lucencies are noted in the bones, compatible with known metastatic disease and increased in size and number from the previous exam. There is a stable compression deformity in the superior endplate at T9. Stable postsurgical changes in the left ribs. Stable fracture in the T9 rib on the left. Stable old healed rib fractures on the right. Review of the MIP images confirms the above findings. IMPRESSION: 1. No evidence of pulmonary embolism. 2. Hazy attenuation in the right lung with patchy infiltrates in the right upper and middle lobes. 3. Small right pleural effusion. 4. Multiple pulmonary nodules in the right lung with mediastinal and right hilar lymphadenopathy, possible metastasis. 5. Multiple hepatic lesions and scattered nodules in the retroperitoneum bilaterally,  concerning for metastatic disease. 6. Stable postsurgical changes of left pneumonectomy. 7. Increasing lucencies in the bones, compatible with known metastatic disease. Stable rib fracture at T9 on the left. 8. Aortic atherosclerosis. Electronically Signed   By: Brett Fairy M.D.   On: 05/10/2022 20:24   DG Chest 2 View  Result Date: 05/10/2022 CLINICAL DATA:  Shortness of breath, lung carcinoma EXAM: CHEST - 2 VIEW COMPARISON:  Previous studies including the examination of 04/28/2022 FINDINGS: There is previous left pneumonectomy. There is a interval increase in interstitial markings throughout the right lung. There is 1.5 cm nodular density in the right apical region with no significant change. Azygous fissure is seen in right upper lung field. There is minimal blunting of right lateral CP angle. IMPRESSION: There is significant interval increase in interstitial markings in right lung suggesting interstitial pneumonia or interstitial edema. Status post left pneumonectomy. Small right pleural effusion. There is 1.5 cm nodule in right apical region with no significant change. Electronically Signed   By: Elmer Picker M.D.   On: 05/10/2022 15:48      Signature  -   Lala Lund M.D on 05/11/2022 at 8:54 AM   -  To page go to [email or web address]

## 2022-05-11 NOTE — ED Notes (Signed)
Upon RN assessment of patient at bedside, pt has increased SOB with labored and tachypneic RR. Patient satting 87% on 4L [place] that he has been on all day. MD hall made aware. Neb orders placed.

## 2022-05-11 NOTE — Consult Note (Signed)
Consultation Note Date: 05/11/2022   Patient Name: Benjamin Case  DOB: [birth year]  MRN: [id number]  Age / Sex: 66 y.o., male  PCP: Benjamin Jordan, MD Referring Physician: Thurnell Lose, MD  Reason for Consultation: Establishing goals of care  HPI/Patient Profile: 66 y.o. male  with past medical history of  Stage IV metastatic lung cancer, paroxysmal A-fib previously on Eliquis, stopped taking the [place] on 05/02/22 after GI bleed, CVAs, chronic hypoxia on 2 L nasal cannula continuously  admitted on 05/10/2022 with dyspnea, worsening for several days.   Patient readmitted within 30 days of hospitalization for pneumonia, now for likely residual pneumonia, acute CHF exacerbation, acute on chronic hypoxic respiratory failure with underlying COPD and metastatic lung cancer.  PMT has been consulted to assist with goals of care conversation.  Clinical Assessment and Goals of Care:  I have reviewed medical records including EPIC notes, labs and imaging, assessed the patient and then met at the bedside with patient's wife Benjamin Case to discuss diagnosis prognosis, [place], EOL wishes, disposition and options.  I introduced Palliative Medicine as specialized medical care for people living with serious illness. It focuses on providing relief from the symptoms and stress of a serious illness. The goal is to improve quality of life for both the patient and the family.  We discussed a brief life review of the patient and then focused on their current illness.   I attempted to elicit values and goals of care important to the patient.    Medical History Review and Understanding:  We had a thorough discussion regarding patient's acute illness in the context of his chronic co-morbidities. Discussed palliative nature of ongoing chemotherapy and plans for 35 rounds of immunotherapy to follow this. Patient and his wife understand the  severity of his illness.  They are very surprised that patient's metastases have progressed so rapidly, however, and have further questions regarding this as related to the type of patient's cancer.  Social History: Patient and his wife have been married for nearly 26 years.  This is his fourth marriage.  He enjoys spending his time with his dog and his wife at home on their property in [place].  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  Palliative Symptoms: Pain, anxiety  Code Status: Concepts specific to code status, artifical feeding and hydration, and rehospitalization were considered and discussed.   Discussion: We had a lengthy conversation about patient's preferences such as avoiding hospitalizations or emergency room visits. He states this is because it is very uncomfortable and overall has been a poor experience in the past.  For example, last hospitalization he was asking to discharge home for several days and was advised against this.  He was then suddenly rushed out for discharge on the following day and reports being told that the bed was needed.  They are also frustrated about the care received, as they feel he was not completely over his pneumonia when sent home. He states "I am ready to die, just not quite yet." His wife is very involved and supportive. At her request, we reviewed CT findings of liver lesions and likelihood that this is metastatic disease.  She would like to discuss this further with patient's oncologist. We also discussed patient's quality of life in detail.  He states that chemotherapy does not bother him much, but he feels like "I have been beaten and stoned" both times after receiving his injection to improve white blood cell count.  We discussed ongoing symptom management including oxycodone and Xanax home.  His goal is to finish chemotherapy, start immunotherapy, and enjoy time at home with his wife as long as he can.  We  discussed options and importance of anticipatory care planning should his respiratory status decline.  Counseled patient and family that escalating care and aggressive interventions including transferred to the ICU, CPR, intubation, would most likely lead to substantial weakness if he survives, as well as increased likelihood that he would be unable to meet his goals of living at home independently.  They are agreeable to meeting again tomorrow for ongoing discussion.  Palliative Care services outpatient were explained and offered.   Discussed the importance of continued conversation with family and the medical providers regarding overall plan of care and treatment options, ensuring decisions are within the context of the patient's values and GOCs.   Questions and concerns were addressed.  Hard Choices booklet left for review. The family was encouraged to call with questions or concerns.  PMT will continue to support holistically.   SUMMARY OF RECOMMENDATIONS   -Continue full code/full scope treatment -Patient's goal is to live at home as long as possible -Ongoing goals of care discussions pending clinical course -Patient would benefit from outpatient palliative care follow-up if agreeable -Psychosocial and emotional support provided -PMT will continue to follow and support  Prognosis:  Unable to determine  Discharge Planning: To Be Determined      Primary Diagnoses: Present on Admission:  Acute on chronic respiratory failure with hypoxia [facility])   Physical Exam Vitals and nursing note reviewed.  Constitutional:      General: He is not in acute distress.    Appearance: He is ill-appearing.     Interventions: Nasal cannula in place.     Comments: 4L  Cardiovascular:     Rate and Rhythm: Normal rate.  Pulmonary:     Effort: Pulmonary effort is normal. No respiratory distress.  Skin:    General: Skin is warm and dry.  Neurological:     Mental Status: He is alert and oriented to  person, place, and time.    Vital Signs: BP 97/66   Pulse 88   Temp 97.9 F (36.6 C) (Oral)   Resp 11   SpO2 93%  Pain Scale: 0-10   Pain Score: 5    SpO2: SpO2: 93 % O2 Device:SpO2: 93 % O2 Flow Rate: .O2 Flow Rate (L/min): 4 L/min   Palliative Assessment/Data: TBD      Total time: I spent 111 minutes in the care of the patient today in the above activities and documenting the encounter.  MDM: High   Lonetta Blassingame Johnnette Litter, PA-C  Palliative Medicine Team Team phone # [phone]  Thank you for allowing the Palliative Medicine Team to assist in the care of this patient. Please utilize secure chat with additional questions, if there is no response within 30 minutes please call the above phone number.  Palliative Medicine Team providers are available by phone from 7am to 7pm daily and can be reached through the team cell phone.  Should this patient require assistance outside of these hours, please call the patient's attending physician.

## 2022-05-12 ENCOUNTER — Other Ambulatory Visit: Payer: Self-pay

## 2022-05-12 ENCOUNTER — Inpatient Hospital Stay (HOSPITAL_COMMUNITY): Payer: Medicare Other

## 2022-05-12 DIAGNOSIS — J189 Pneumonia, unspecified organism: Secondary | ICD-10-CM | POA: Diagnosis not present

## 2022-05-12 DIAGNOSIS — Y95 Nosocomial condition: Secondary | ICD-10-CM | POA: Diagnosis not present

## 2022-05-12 DIAGNOSIS — Z7189 Other specified counseling: Secondary | ICD-10-CM | POA: Diagnosis not present

## 2022-05-12 DIAGNOSIS — J9621 Acute and chronic respiratory failure with hypoxia: Secondary | ICD-10-CM | POA: Diagnosis not present

## 2022-05-12 LAB — TYPE AND SCREEN
ABO/RH(D): O POS
Antibody Screen: NEGATIVE

## 2022-05-12 LAB — COMPREHENSIVE METABOLIC PANEL
ALT: 20 U/L (ref 0–44)
AST: 21 U/L (ref 15–41)
Albumin: 2.5 g/dL — ABNORMAL LOW (ref 3.5–5.0)
Alkaline Phosphatase: 99 U/L (ref 38–126)
Anion gap: 11 (ref 5–15)
BUN: 22 mg/dL (ref 8–23)
CO2: 28 mmol/L (ref 22–32)
Calcium: 8.8 mg/dL — ABNORMAL LOW (ref 8.9–10.3)
Chloride: 92 mmol/L — ABNORMAL LOW (ref 98–111)
Creatinine, Ser: 1.35 mg/dL — ABNORMAL HIGH (ref 0.61–1.24)
GFR, Estimated: 57 mL/min — ABNORMAL LOW (ref >60)
Glucose, Bld: 138 mg/dL — ABNORMAL HIGH (ref 70–99)
Potassium: 4.4 mmol/L (ref 3.5–5.1)
Sodium: 131 mmol/L — ABNORMAL LOW (ref 135–145)
Total Bilirubin: 0.5 mg/dL (ref 0.3–1.2)
Total Protein: 5.1 g/dL — ABNORMAL LOW (ref 6.5–8.1)

## 2022-05-12 LAB — CBC WITH DIFFERENTIAL/PLATELET
Abs Immature Granulocytes: 2.81 10*3/uL — ABNORMAL HIGH (ref 0.00–0.07)
Basophils Absolute: 0.1 10*3/uL (ref 0.0–0.1)
Basophils Relative: 0 %
Eosinophils Absolute: 0.3 10*3/uL (ref 0.0–0.5)
Eosinophils Relative: 1 %
HCT: 24 % — ABNORMAL LOW (ref 39.0–52.0)
Hemoglobin: 8.1 g/dL — ABNORMAL LOW (ref 13.0–17.0)
Immature Granulocytes: 10 %
Lymphocytes Relative: 4 %
Lymphs Abs: 1 10*3/uL (ref 0.7–4.0)
MCH: 33.8 pg (ref 26.0–34.0)
MCHC: 33.8 g/dL (ref 30.0–36.0)
MCV: 100 fL (ref 80.0–100.0)
Monocytes Absolute: 1.1 10*3/uL — ABNORMAL HIGH (ref 0.1–1.0)
Monocytes Relative: 4 %
Neutro Abs: 22.3 10*3/uL — ABNORMAL HIGH (ref 1.7–7.7)
Neutrophils Relative %: 81 %
Platelets: 212 10*3/uL (ref 150–400)
RBC: 2.4 MIL/uL — ABNORMAL LOW (ref 4.22–5.81)
RDW: 13.1 % (ref 11.5–15.5)
WBC: 27.5 10*3/uL — ABNORMAL HIGH (ref 4.0–10.5)
nRBC: 0 % (ref 0.0–0.2)

## 2022-05-12 LAB — HEMOGLOBIN AND HEMATOCRIT, BLOOD
HCT: 27 % — ABNORMAL LOW (ref 39.0–52.0)
Hemoglobin: 9.4 g/dL — ABNORMAL LOW (ref 13.0–17.0)

## 2022-05-12 LAB — MAGNESIUM: Magnesium: 1.7 mg/dL (ref 1.7–2.4)

## 2022-05-12 LAB — BRAIN NATRIURETIC PEPTIDE: B Natriuretic Peptide: 329.7 pg/mL — ABNORMAL HIGH (ref 0.0–100.0)

## 2022-05-12 MED ORDER — DIGOXIN 0.25 MG/ML IJ SOLN
0.2500 mg | Freq: Once | INTRAMUSCULAR | Status: AC
  Administered 2022-05-12: 0.25 mg via INTRAVENOUS
  Filled 2022-05-12: qty 1

## 2022-05-12 MED ORDER — TRAMADOL HCL 50 MG PO TABS: 50.0000 mg | ORAL_TABLET | Freq: Four times a day (QID) | ORAL | Status: DC | PRN

## 2022-05-12 MED ORDER — OXYCODONE-ACETAMINOPHEN 5-325 MG PO TABS
1.0000 | ORAL_TABLET | ORAL | Status: DC | PRN
  Administered 2022-05-12 – 2022-05-14 (×10): 1 via ORAL
  Filled 2022-05-12 (×10): qty 1

## 2022-05-12 MED ORDER — FUROSEMIDE 10 MG/ML IJ SOLN
40.0000 mg | Freq: Once | INTRAMUSCULAR | Status: AC
  Administered 2022-05-12: 40 mg via INTRAVENOUS
  Filled 2022-05-12: qty 4

## 2022-05-12 MED ORDER — METOPROLOL TARTRATE 50 MG PO TABS
50.0000 mg | ORAL_TABLET | Freq: Two times a day (BID) | ORAL | Status: DC
  Administered 2022-05-12 – 2022-05-14 (×5): 50 mg via ORAL
  Filled 2022-05-12 (×5): qty 1

## 2022-05-12 MED ORDER — ALPRAZOLAM 0.5 MG PO TABS
1.0000 mg | ORAL_TABLET | Freq: Two times a day (BID) | ORAL | Status: DC | PRN
  Administered 2022-05-12 – 2022-05-13 (×2): 1 mg via ORAL
  Filled 2022-05-12 (×2): qty 2

## 2022-05-12 MED ORDER — OXYCODONE HCL 5 MG PO TABS
5.0000 mg | ORAL_TABLET | ORAL | Status: DC | PRN
  Administered 2022-05-12 – 2022-05-14 (×10): 5 mg via ORAL
  Filled 2022-05-12 (×10): qty 1

## 2022-05-12 MED ORDER — DIGOXIN 0.25 MG/ML IJ SOLN
0.1250 mg | Freq: Four times a day (QID) | INTRAMUSCULAR | Status: DC
  Administered 2022-05-12: 0.125 mg via INTRAVENOUS
  Filled 2022-05-12 (×2): qty 0.5

## 2022-05-12 MED ORDER — MAGNESIUM SULFATE 2 GM/50ML IV SOLN
2.0000 g | Freq: Once | INTRAVENOUS | Status: AC
  Administered 2022-05-12: 2 g via INTRAVENOUS
  Filled 2022-05-12: qty 50

## 2022-05-12 MED ORDER — ALPRAZOLAM 0.5 MG PO TABS
1.0000 mg | ORAL_TABLET | Freq: Every day | ORAL | Status: DC
  Administered 2022-05-12 – 2022-05-13 (×2): 1 mg via ORAL
  Filled 2022-05-12 (×2): qty 2

## 2022-05-12 MED ORDER — IPRATROPIUM-ALBUTEROL 0.5-2.5 (3) MG/3ML IN SOLN
3.0000 mL | Freq: Four times a day (QID) | RESPIRATORY_TRACT | Status: DC
  Administered 2022-05-13 – 2022-05-14 (×6): 3 mL via RESPIRATORY_TRACT
  Filled 2022-05-12 (×6): qty 3

## 2022-05-12 MED ORDER — METOPROLOL TARTRATE 5 MG/5ML IV SOLN
5.0000 mg | Freq: Once | INTRAVENOUS | Status: AC
  Administered 2022-05-12: 5 mg via INTRAVENOUS
  Filled 2022-05-12: qty 5

## 2022-05-12 NOTE — Progress Notes (Signed)
Patient given ordered medications. Patient has converted back into SR. Patient is up in chair. Patient is A&o X4, 97% on  4L [place].

## 2022-05-12 NOTE — Progress Notes (Signed)
Daily Progress Note   Patient Name: Benjamin Case       Date: 05/12/2022 DOB: Sep 21, 1955  Age: 66 y.o. MRN#: [id number] Attending Physician: Thurnell Lose, MD Primary Care Physician: [name], MD Admit Date: 05/10/2022  Reason for Consultation/Follow-up: Establishing goals of care  Subjective: Medical records reviewed including progress notes, labs, imaging. Patient assessed at the bedside. Discussed with RN.  Patient's wife is present at the bedside.  Created space and opportunity for patient and family's thoughts and feelings on his current illness.  Reviewed overnight A-fib with RVR and resulting treatment/resolution.  Patient's wife wonders if his A-fib could be due to chemotherapy or his cancer and we also discussed that this could be due to his acute illness overall.  Emphasized the importance of planning for all possible outcomes, as there may be a time where arrhythmias worsen rather than improve and they are faced with difficult decisions including whether to perform CPR. Recommended consideration of DNR status, understanding evidenced-based poor outcomes in similar hospitalized patients, as the cause of the arrest is likely associated with chronic/terminal disease rather than a reversible acute cardio-pulmonary event.  Patient's wife shares that she will have to reflect on this further before making any decisions.    We then discussed difficulties with pain management since admission.  Apparently he has been unable to take his 5 daily doses every 2 hours as he usually does at home.  He is also frustrated that he has to wait for medications and that he cannot hold onto them until he is ready.  Discussed the risks and benefits of this being scheduled every 2 hours as needed and he  is agreeable.  Discussed anxiety management as well.  Reviewed PDMP thoroughly.  Patient's current MME per day is 75 with an average of 55 MME per day over the last 30 days.  Patient and family agreeable to meeting again on Friday when this PA is back on service.  Questions and concerns addressed. PMT will continue to support holistically.   Length of Stay: 2  Physical Exam Vitals and nursing note reviewed.  Constitutional:      General: He is not in acute distress.    Interventions: Nasal cannula in place.     Comments: 5L  Cardiovascular:     Rate and Rhythm: Tachycardia  present.  Pulmonary:     Effort: Pulmonary effort is normal.  Neurological:     Mental Status: He is alert and oriented to person, place, and time.  Psychiatric:        Mood and Affect: Mood normal.        Behavior: Behavior normal.            Vital Signs: BP 119/66 (BP Location: Left Arm)   Pulse (!) 123   Temp 98 F (36.7 C) (Oral)   Resp 16   SpO2 96%  SpO2: SpO2: 96 % O2 Device: O2 Device: Nasal Cannula O2 Flow Rate: O2 Flow Rate (L/min): 5 L/min      Palliative Assessment/Data:   Palliative Care Assessment & Plan   Patient Profile: 66 y.o. male  with past medical history of  Stage IV metastatic lung cancer, paroxysmal A-fib previously on Eliquis, stopped taking the DOAC on 05/02/22 after GI bleed, CVAs, chronic hypoxia on 2 L nasal cannula continuously  admitted on 05/10/2022 with dyspnea, worsening for several days.    Patient readmitted within 30 days of hospitalization for pneumonia, now for likely residual pneumonia, acute CHF exacerbation, acute on chronic hypoxic respiratory failure with underlying COPD and metastatic lung cancer.  PMT has been consulted to assist with goals of care conversation.  Assessment: Goals of care conversation Metastatic lung cancer Pneumonia COPD History of CVAs A-fib  Recommendations/Plan: Continue full code/full scope treatment Patient and his wife will  consider DNR further but not ready to decide  Adjusted oxycodone 10-325mg  order to every 2 hours as needed Xanax 1mg  PO scheduled at bedtime with 2 other doses daily as needed (patient typically takes twice in the afternoon) Psychosocial and emotional support provided PMT will continue to follow and support   Prognosis:  Unable to determine  Discharge Planning: To Be Determined  Care plan was discussed with patient, patient's wife, RN   MDM high         [place], PA-C  Palliative Medicine Team Team phone # [phone]  Thank you for allowing the Palliative Medicine Team to assist in the care of this patient. Please utilize secure chat with additional questions, if there is no response within 30 minutes please call the above phone number.  Palliative Medicine Team providers are available by phone from 7am to 7pm daily and can be reached through the team cell phone.  Should this patient require assistance outside of these hours, please call the patient's attending physician.

## 2022-05-12 NOTE — Evaluation (Signed)
Occupational Therapy Evaluation Patient Details Name: Benjamin Case MRN: [id number] DOB: 12-16-1955 Today's Date: 05/12/2022   History of Present Illness Pt is a 66 y.o. M who presents 05/10/2022 with acute on chronic hypoxic respiratory failure in a patient with metastatic lung CA. Recent admission for PNA underlying COPD and ongoing smoking. Significant PMH: stage IV metastatic lung CA, paroxysmal A-fib, previously on Eliquis, stopped taking DOAC on 05/02/22 after GIB, CVAs, chronic hypoxia on 2L O2 continuously.   Clinical Impression   Pt reports independence at baseline with ADLs and functional mobility, lives with spouse who can assist at d/c. Pt currently on 5L O2, SpO2 89% at lowest with chair transfer, HR ranging from 110-150bpm during session. Pt set up -min A for ADLs, mod I for bed mobility and min guard for transfers without AD. Pt presenting with impairments listed below, will follow acutely to address energy conservation strategies and activity tolerance with ADLs and mobility. Anticipate no OT follow up needs at d/c.     Recommendations for follow up therapy are one component of a multi-disciplinary discharge planning process, led by the attending physician.  Recommendations may be updated based on patient status, additional functional criteria and insurance authorization.   Follow Up Recommendations  No OT follow up     Assistance Recommended at Discharge Set up Supervision/Assistance  Patient can return home with the following A little help with bathing/dressing/bathroom;Assistance with cooking/housework;Assist for transportation    Functional Status Assessment  Patient has had a recent decline in their functional status and demonstrates the ability to make significant improvements in function in a reasonable and predictable amount of time.  Equipment Recommendations  None recommended by OT (pt has all needed DME)    Recommendations for Other Services PT consult      Precautions / Restrictions Precautions Precautions: Other (comment) Precaution Comments: watch O2 and HR Restrictions Weight Bearing Restrictions: No      Mobility Bed Mobility Overal bed mobility: Modified Independent             General bed mobility comments: HOB elevated    Transfers Overall transfer level: Needs assistance Equipment used: None Transfers: Sit to/from Stand, Bed to chair/wheelchair/BSC Sit to Stand: Min guard     Step pivot transfers: Min guard            Balance Overall balance assessment: No apparent balance deficits (not formally assessed)                                         ADL either performed or assessed with clinical judgement   ADL Overall ADL's : Needs assistance/impaired Eating/Feeding: Set up;Sitting   Grooming: Set up;Sitting   Upper Body Bathing: Minimal assistance;Standing   Lower Body Bathing: Set up;Sitting/lateral leans   Upper Body Dressing : Sitting;Minimal assistance   Lower Body Dressing: Minimal assistance;Sitting/lateral leans   Toilet Transfer: Supervision/safety;Stand-pivot;BSC/3in1   Toileting- Clothing Manipulation and Hygiene: Min guard       Functional mobility during ADLs: Min guard       Vision   Vision Assessment?: No apparent visual deficits     Perception Perception Perception Tested?: No   Praxis Praxis Praxis tested?: Not tested    Pertinent Vitals/Pain Pain Assessment Pain Assessment: No/denies pain Faces Pain Scale: Hurts a little bit Pain Location: low back Pain Descriptors / Indicators: Discomfort Pain Intervention(s): Limited activity within patient's tolerance, Monitored  during session, Repositioned     Hand Dominance     Extremity/Trunk Assessment Upper Extremity Assessment Upper Extremity Assessment: Overall WFL for tasks assessed   Lower Extremity Assessment Lower Extremity Assessment: Overall WFL for tasks assessed   Cervical / Trunk  Assessment Cervical / Trunk Assessment: Normal   Communication Communication Communication: No difficulties   Cognition Arousal/Alertness: Awake/alert Behavior During Therapy: WFL for tasks assessed/performed Overall Cognitive Status: Within Functional Limits for tasks assessed                                       General Comments  HR up to 150 with bed mobility/step pivot transfer, between 110-150 during session, RN notified    Exercises     Shoulder Instructions      Home Living Family/patient expects to be discharged to:: Private residence Living Arrangements: Spouse/significant other Available Help at Discharge: Family Type of Home: House Home Access: Stairs to enter Technical brewer of Steps: 4 Entrance Stairs-Rails: Right;Left Home Layout: One level     Bathroom Shower/Tub: Occupational psychologist: Standard     Home Equipment: Conservation officer, nature (2 wheels);Cane - single point;Shower seat;BSC/3in1          Prior Functioning/Environment Prior Level of Function : Independent/Modified Independent;Driving             Mobility Comments: On 2.5L O2 at home, no AD ADLs Comments: indep ADL's, pt wife mainly performing IADL's        OT Problem List: Decreased strength;Decreased range of motion;Decreased activity tolerance;Impaired balance (sitting and/or standing);Cardiopulmonary status limiting activity      OT Treatment/Interventions: Self-care/ADL training;Therapeutic exercise;DME and/or AE instruction;Energy conservation;Therapeutic activities;Patient/family education;Balance training    OT Goals(Current goals can be found in the care plan section) Acute Rehab OT Goals Patient Stated Goal: noen stated OT Goal Formulation: With patient Time For Goal Achievement: 05/26/22 Potential to Achieve Goals: Good ADL Goals Pt Will Perform Upper Body Dressing: Independently;sitting;standing Pt Will Perform Lower Body Dressing:  Independently;sitting/lateral leans;sit to/from stand Pt Will Perform Tub/Shower Transfer: Tub transfer;Shower transfer;Independently;ambulating;shower seat Additional ADL Goal #1: pt will be able to stand x10 min for functional task in order to improve activity tolerance for ADLs Additional ADL Goal #2: pt will verbalize 3 energy conservation strategies in prep for home  OT Frequency: Min 2X/week    Co-evaluation              AM-PAC OT "6 Clicks" Daily Activity     Outcome Measure Help from another person eating meals?: None Help from another person taking care of personal grooming?: None Help from another person toileting, which includes using toliet, bedpan, or urinal?: A Little Help from another person bathing (including washing, rinsing, drying)?: A Little Help from another person to put on and taking off regular upper body clothing?: A Little Help from another person to put on and taking off regular lower body clothing?: A Little 6 Click Score: 20   End of Session Equipment Utilized During Treatment: Oxygen Nurse Communication: Mobility status  Activity Tolerance: Patient tolerated treatment well Patient left: in chair;with call bell/phone within reach  OT Visit Diagnosis: Unsteadiness on feet (R26.81);Other abnormalities of gait and mobility (R26.89);Muscle weakness (generalized) (M62.81)                Time: 3825-0539 OT Time Calculation (min): 15 min Charges:  OT General Charges $OT Visit: 1  Visit OT Evaluation $OT Eval Low Complexity: 1 Low  Renaye Rakers, OTD, OTR/L SecureChat Preferred Acute Rehab [phone]  Renaye Rakers Koonce 05/12/2022, 11:32 AM

## 2022-05-12 NOTE — Evaluation (Signed)
Clinical/Bedside Swallow Evaluation Patient Details  Name: Benjamin Case MRN: [id number] Date of Birth: [birth year]  Today's Date: 05/12/2022 Time: SLP Start Time (ACUTE ONLY): 63 SLP Stop Time (ACUTE ONLY): 8416 SLP Time Calculation (min) (ACUTE ONLY): 20 min  Past Medical History:  Past Medical History:  Diagnosis Date   Arthritis    back   COPD (chronic obstructive pulmonary disease) ([place])    Diabetes mellitus without complication (HCC)    no medications , monitors cbg at home with monitor, avg no more than 150s at home    Dyspnea    increased exertion; pt states can climb flight of stairs w/o having to stop prior to reaching top ; ongoing reports it is due to "having one lung"    GERD (gastroesophageal reflux disease)    Hepatitis    hepatitis C; pt states competed treatments, unsure what medication he was treated with    History of chemotherapy    Hypertension    reports its been over a year since he stopped taking bp medication, reports he made her medical doctor aware that he was topping it    lung ca dx'd 12/2007   lt lobectomy   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   and back - see note in [facility] 12/26/21   Numbness    FEET BILATERAL   Pneumonia 2009   Right upper lobe pulmonary nodule 04/29/2021   Rotator cuff tear    right    Past Surgical History:  Past Surgical History:  Procedure Laterality Date   ARTHROSCOPIC REPAIR ACL Right    BRONCHIAL BIOPSY  06/09/2021   Procedure: BRONCHIAL BIOPSIES;  Surgeon: Garner Nash, DO;  Location: [facility];  Service: Pulmonary;;   BRONCHIAL BRUSHINGS  06/09/2021   Procedure: BRONCHIAL BRUSHINGS;  Surgeon: Garner Nash, DO;  Location: [facility];  Service: Pulmonary;;   BRONCHIAL WASHINGS  06/09/2021   Procedure: BRONCHIAL WASHINGS;  Surgeon: Garner Nash, DO;  Location: [facility];  Service: Pulmonary;;   CATARACT EXTRACTION, BILATERAL  2019   DECOMPRESSIVE LUMBAR LAMINECTOMY LEVEL 1 N/A 03/24/2016   Procedure:  L3-4 CENTRAL DECOMPRESSION LUMBER LAMINECTOMY;  Surgeon: Latanya Maudlin, MD;  Location: [facility];  Service: Orthopedics;  Laterality: N/A;   FINE NEEDLE ASPIRATION  03/23/2022   Procedure: FINE NEEDLE ASPIRATION (FNA) LINEAR;  Surgeon: Garner Nash, DO;  Location: [facility];  Service: Pulmonary;;   FOOT SURGERY Bilateral    HAND SURGERY Bilateral    LOBECTOMY Left 2009   pneumonectomy Left 2009   [name]   SHOULDER ARTHROSCOPY WITH DISTAL CLAVICLE RESECTION Right 12/14/2018   Procedure: SHOULDER ARTHROSCOPY WITH DISTAL CLAVICLE RESECTION;  Surgeon: Tania Ade, MD;  Location: [facility];  Service: Orthopedics;  Laterality: Right;   SHOULDER ARTHROSCOPY WITH ROTATOR CUFF REPAIR Right 12/14/2018   Procedure: SHOULDER ARTHROSCOPY WITH ROTATOR CUFF REPAIR;  Surgeon: Tania Ade, MD;  Location: [facility];  Service: Orthopedics;  Laterality: Right;   SHOULDER ARTHROSCOPY WITH SUBACROMIAL DECOMPRESSION Right 12/14/2018   Procedure: SHOULDER ARTHROSCOPY WITH SUBACROMIAL DECOMPRESSION;  Surgeon: Tania Ade, MD;  Location: [facility];  Service: Orthopedics;  Laterality: Right;   TRIGGER FINGER RELEASE     right hand, pointer finger    VIDEO BRONCHOSCOPY WITH ENDOBRONCHIAL ULTRASOUND Right 03/23/2022   Procedure: VIDEO BRONCHOSCOPY WITH ENDOBRONCHIAL ULTRASOUND;  Surgeon: Garner Nash, DO;  Location: [facility];  Service: Pulmonary;  Laterality: Right;   VIDEO BRONCHOSCOPY WITH RADIAL ENDOBRONCHIAL ULTRASOUND  06/09/2021   Procedure: VIDEO BRONCHOSCOPY WITH RADIAL  ENDOBRONCHIAL ULTRASOUND;  Surgeon: Garner Nash, DO;  Location: [facility];  Service: Pulmonary;;   HPI:  66yo male admitted 05/10/22 with progressive worsening SOB. PMH: Stage4 metastatic lung cancer s/p chemo and left lobectomy, HTN,  PNA (2009), PAFib, GI Bleed, CVAs, chronic hypoxia on 2LNC, Arthritis, COPD, DN, dyspnea, GERD, hepatitis CTChest = patchy infiltrates suggestive of bilateral PNA.    Assessment / Plan /  Recommendation  Clinical Impression  Pt seen at bedside for assessment of swallow function and safety. Pt reports no difficulty swallowing prior to admit. Pt has had CVAs in the past, however, CN exam is unremarkable. He presents with adequate natural dentition, missing only a few teeth per pt. Pt accepted trials of puree, solid, and thin liquid consistencies. Pt passed 3oz water challenge without difficulty, indicating low risk of aspiration.   Pt was receptive to education regarding increased risk of silent aspiration with COPD, which could be ruled out via MBS. Pt declined instrumental study at this time, and reiterated that he has no difficulty swallowing.   Recommend regular diet with thin liquids, to allow pt full range of choices for PO intake. RN and MD informed. No further ST intervention recommended at this time. Please reconsult if needs arise. Pt was encouraged to notify MD/RN if he decides he does want to proceed with MBS in the future.  SLP Visit Diagnosis: Dysphagia, unspecified (R13.10)    Aspiration Risk  Moderate aspiration risk;Mild aspiration risk    Diet Recommendation Thin liquid;Regular   Supervision: Patient able to self feed Compensations: Slow rate;Small sips/bites Postural Changes: Seated upright at 90 degrees;Remain upright for at least 30 minutes after po intake    Other  Recommendations Oral Care Recommendations: Oral care BID    Recommendations for follow up therapy are one component of a multi-disciplinary discharge planning process, led by the attending physician.  Recommendations may be updated based on patient status, additional functional criteria and insurance authorization.  Follow up Recommendations No SLP follow up      Functional Status Assessment Patient has not had a recent decline in their functional status      Prognosis Prognosis for Safe Diet Advancement: Good      Swallow Study   General Date of Onset: 05/10/22 HPI: 66yo male  admitted 05/10/22 with progressive worsening SOB. PMH: Stage4 metastatic lung cancer s/p chemo and left lobectomy, HTN,  PNA (2009), PAFib, GI Bleed, CVAs, chronic hypoxia on 2LNC, Arthritis, COPD, DN, dyspnea, GERD, hepatitis CTChest = patchy infiltrates suggestive of bilateral PNA. Type of Study: Bedside Swallow Evaluation Previous Swallow Assessment: none found Diet Prior to this Study: Dysphagia 3 (soft);Thin liquids Temperature Spikes Noted: No Respiratory Status: Nasal cannula History of Recent Intubation: No Behavior/Cognition: Alert;Cooperative Oral Cavity Assessment: Within Functional Limits Oral Care Completed by SLP: No Oral Cavity - Dentition: Adequate natural dentition Vision: Functional for self-feeding Self-Feeding Abilities: Able to feed self Patient Positioning: Upright in bed Baseline Vocal Quality: Normal Volitional Cough: Strong;Congested Volitional Swallow: Able to elicit    Oral/Motor/Sensory Function Overall Oral Motor/Sensory Function: Within functional limits   Ice Chips Ice chips: Not tested   Thin Liquid Thin Liquid: Within functional limits Presentation: Straw    Nectar Thick Nectar Thick Liquid: Not tested   Honey Thick Honey Thick Liquid: Not tested   Puree Puree: Within functional limits Presentation: Self Fed;Spoon   Solid     Solid: Within functional limits Presentation: Benjamin Case, MSP, CCC-SLP Speech Language  Pathologist Office: [phone]  Shonna Chock 05/12/2022,10:50 AM

## 2022-05-12 NOTE — Plan of Care (Signed)
  Problem: Education: Goal: Knowledge of General Education information will improve Description: Including pain rating scale, medication(s)/side effects and non-pharmacologic comfort measures Outcome: Progressing   Problem: Clinical Measurements: Goal: Will remain free from infection Outcome: Progressing Goal: Respiratory complications will improve Outcome: Progressing Goal: Cardiovascular complication will be avoided Outcome: Progressing   Problem: Activity: Goal: Risk for activity intolerance will decrease Outcome: Progressing   Problem: Nutrition: Goal: Adequate nutrition will be maintained Outcome: Progressing   Problem: Coping: Goal: Level of anxiety will decrease Outcome: Progressing   Problem: Elimination: Goal: Will not experience complications related to bowel motility Outcome: Progressing Goal: Will not experience complications related to urinary retention Outcome: Progressing

## 2022-05-12 NOTE — Progress Notes (Signed)
Pt moved from chair to bed and O2 Sats dropped to 78%.  O2 increased to 5 liters due to dyspnea.  Sats now 97%, and dyspnea has resolved.

## 2022-05-12 NOTE — Evaluation (Signed)
Physical Therapy Evaluation Patient Details Name: Benjamin Case MRN: [id number] DOB: August 17, 1955 Today's Date: 05/12/2022  History of Present Illness  Pt is a 66 y.o. M who presents 05/10/2022 with acute on chronic hypoxic respiratory failure in a patient with metastatic lung CA. Recent admission for PNA underlying COPD and ongoing smoking. Significant PMH: stage IV metastatic lung CA, paroxysmal A-fib, previously on Eliquis, stopped taking DOAC on 05/02/22 after GIB, CVAs, chronic hypoxia on 2L O2 continuously.  Clinical Impression  PTA, pt lives with his wife and is independent with mobility and ADL's. Pt presents with decreased cardiopulmonary endurance and activity tolerance in comparison to his baseline. Pt also reporting chronic low back pain, with noted erector spinae tightness L > R. Tolerated manual stretching BLE's prior to mobilizing. Pt ambulating room distances with no assistive device or physical assist. SpO2 93-100% on 5L O2, HR 80-110 bpm. Initiated education on energy conservation and activity progression. Pt overall has fair awareness of deficits/limitations. Will continue to follow acutely to progress mobility as tolerated. Don't anticipate need for PT follow up.     Recommendations for follow up therapy are one component of a multi-disciplinary discharge planning process, led by the attending physician.  Recommendations may be updated based on patient status, additional functional criteria and insurance authorization.  Follow Up Recommendations No PT follow up      Assistance Recommended at Discharge PRN  Patient can return home with the following  Assistance with cooking/housework;Assist for transportation;Help with stairs or ramp for entrance    Equipment Recommendations None recommended by PT  Recommendations for Other Services       Functional Status Assessment Patient has had a recent decline in their functional status and demonstrates the ability to make significant  improvements in function in a reasonable and predictable amount of time.     Precautions / Restrictions Precautions Precautions: Other (comment) Precaution Comments: watch O2 Restrictions Weight Bearing Restrictions: No      Mobility  Bed Mobility Overal bed mobility: Modified Independent             General bed mobility comments: HOB elevated    Transfers Overall transfer level: Independent Equipment used: None                    Ambulation/Gait Ambulation/Gait assistance: Supervision Gait Distance (Feet): 20 Feet Assistive device: None Gait Pattern/deviations: WFL(Within Functional Limits)       General Gait Details: Supervision for line management only; no evidence of imbalance with dynamic balance i.e. marching in place  Stairs            Wheelchair Mobility    Modified Rankin (Stroke Patients Only)       Balance Overall balance assessment: No apparent balance deficits (not formally assessed)                                           Pertinent Vitals/Pain Pain Assessment Pain Assessment: Faces Faces Pain Scale: Hurts a little bit Pain Location: low back Pain Descriptors / Indicators: Discomfort Pain Intervention(s): Monitored during session    Home Living Family/patient expects to be discharged to:: Private residence Living Arrangements: Spouse/significant other Available Help at Discharge: Family Type of Home: House Home Access: Stairs to enter Entrance Stairs-Rails: Psychiatric nurse of Steps: 4   Home Layout: One level Home Equipment: Conservation officer, nature (2 wheels);Cane - single point  Prior Function Prior Level of Function : Independent/Modified Independent             Mobility Comments: On 2.5L O2 at home, no AD ADLs Comments: indep ADL's, pt wife mainly performing IADL's     Hand Dominance        Extremity/Trunk Assessment   Upper Extremity Assessment Upper Extremity  Assessment: Overall WFL for tasks assessed    Lower Extremity Assessment Lower Extremity Assessment: Overall WFL for tasks assessed    Cervical / Trunk Assessment Cervical / Trunk Assessment: Normal  Communication   Communication: No difficulties  Cognition Arousal/Alertness: Awake/alert Behavior During Therapy: WFL for tasks assessed/performed Overall Cognitive Status: Within Functional Limits for tasks assessed                                          General Comments      Exercises General Exercises - Lower Extremity Long Arc Quad: Both, 10 reps, Seated Other Exercises Other Exercises: Supine: BLE manual stretching hamstrings, piriformis, hip extensors x ~45 s each   Assessment/Plan    PT Assessment Patient needs continued PT services  PT Problem List Decreased activity tolerance;Decreased mobility;Cardiopulmonary status limiting activity       PT Treatment Interventions Gait training;Stair training;Functional mobility training;Therapeutic activities;Therapeutic exercise;Balance training;Patient/family education    PT Goals (Current goals can be found in the Care Plan section)  Acute Rehab PT Goals Patient Stated Goal: to return home when ready PT Goal Formulation: With patient Time For Goal Achievement: 05/26/22 Potential to Achieve Goals: Good    Frequency Min 3X/week     Co-evaluation               AM-PAC PT "6 Clicks" Mobility  Outcome Measure Help needed turning from your back to your side while in a flat bed without using bedrails?: None Help needed moving from lying on your back to sitting on the side of a flat bed without using bedrails?: None Help needed moving to and from a bed to a chair (including a wheelchair)?: None Help needed standing up from a chair using your arms (e.g., wheelchair or bedside chair)?: None Help needed to walk in hospital room?: A Little Help needed climbing 3-5 steps with a railing? : A Little 6 Click  Score: 22    End of Session Equipment Utilized During Treatment: Oxygen Activity Tolerance: Patient tolerated treatment well Patient left: in bed;with call bell/phone within reach Nurse Communication: Mobility status PT Visit Diagnosis: Difficulty in walking, not elsewhere classified (R26.2)    Time: 0822-0850 PT Time Calculation (min) (ACUTE ONLY): 28 min   Charges:   PT Evaluation $PT Eval Moderate Complexity: 1 Mod PT Treatments $Therapeutic Activity: 8-22 mins        Wyona Almas, PT, DPT Acute Rehabilitation Services Office [phone]   Deno Etienne 05/12/2022, 9:27 AM

## 2022-05-12 NOTE — Progress Notes (Signed)
[facility] night cross cover note:   I was notified by RN that the patient is experiencing some increasing shortness of breath associated with increasing supplemental oxygen demands.  He had previously been on 4 L nasal cannula with oxygen saturations in the low to mid 90s, but is desaturated into the 70s on 4 L nasal cannula, now in the low to mid 90s on 6 L nasal cannula.  This appears concomitant with going into atrial fibrillation with RVR, with heart rates sustained in the 130s mmHg.  He has a known history of atrial fibrillation with RVR, on oral metoprolol.  Stop blood pressures in the low 100s mmHg  Per brief chart review, patient with acute on chronic hyproxic respiratory failure which appears multifactorial in nature, noting underlying COPD as well as acute on chronic diastolic heart failure.  Also appears that he experienced a recent gastrointestinal bleed.  Today CBC shows hemoglobin 8.1, compared to most recent prior value 9.6 yesterday.  Given this decline in hemoglobin in spite of interval IV diuresis, I have ordered repeat hemoglobin level to be checked at 10 AM this morning.   Checking stat EKG, stat chest x-ray.  This morning's her magnesium level is 1.7.  I have ordered Lopressor 5 mg IV x 1, Lasix 40 mg IV x 1, as well as 2 g of IV magnesium sulfate over 2 hours x 1 now.    Babs Bertin, DO Hospitalist

## 2022-05-12 NOTE — Progress Notes (Signed)
PROGRESS NOTE                                                                                                                                                                                                             Patient Demographics:    Benjamin Case, is a 66 y.o. male, DOB - August 04, 1955, GYJ:[id number]  Outpatient Primary MD for the patient is [name], MD    LOS - 2  Admit date - 05/10/2022    No chief complaint on file.      Brief Narrative (HPI from H&P)   66 y.o. male with medical history significant for Stage IV metastatic lung cancer, paroxysmal A-fib previously on Eliquis, stopped taking the DOAC on 05/02/22 after GI bleed, CVAs saw medical neurologist [name] on 05/10/2022 and was placed on dual antiplatelet therapy, chronic hypoxia on 2 L nasal cannula continuously, currently hospitalized for pneumonia, he now presents with progressive shortness of breath, workup in the ER suggestive of metastatic lung cancer with possibility of reoccurrence of pneumonia.  He was admitted to the hospital for further care.     Subjective:    Patient in bed, appears comfortable, denies any headache, no fever, no chest pain or pressure, no shortness of breath , no abdominal pain. No new focal weakness.    Assessment  & Plan :   Acute on chronic hypoxic respiratory failure in a patient with metastatic lung cancer recent admission for pneumonia underlying COPD and ongoing smoking. He will be admitted to the hospital, continue supportive care with oxygen and nebulizer treatments, he is MRSA negative on his nasal PCR, also has elevated BNP.  CTA negative for PE.  I think his presentation is a mixture of possible continued pneumonia along with some element of CHF, also has metastatic stage IV lung cancer.  He will be continued on antibiotics today will be titrated down as he is MRSA negative, will gently diurese.  Monitor closely  long-term prognosis is extremely guarded.  Acute on chronic diastolic CHF EF 26%.  New beta-blocker as tolerated by blood pressure along with Lasix for diuresis.  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  Supportive care under the care of [name], long-term prognosis again extremely poor.  Palliative care involved.  Paroxysmal atrial fibrillation with [place] vas 2 score of 5.  placed on beta-blocker for rate control, 2 doses of digoxin on 05/12/2022, anticoagulation was discontinued due to GI bleed.  Continue to monitor.  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  Has seen [name], on dual antiplatelet therapy and statin for secondary prevention.  Chronic alcohol abuse and smoking.  Counseled to quit both.  Monitor for DTs.  Per patient and wife no alcohol in the last 30 days.  COPD.  Supportive care.  On home oxygen.  GERD and recent GI bleed.  On PPI.  Leukocytosis.  Somewhat chronic.  Ongoing chemotherapy, getting Neulasta.         Condition - Extremely Guarded  Family Communication  :  wife Vaughan Basta [phone]  on 05/11/22  Code Status :  Full  Consults  :  Pall care  PUD Prophylaxis :    Procedures  :     CTA - 1. No evidence of pulmonary embolism. 2. Hazy attenuation in the right lung with patchy infiltrates in the right upper and middle lobes. 3. Small right pleural effusion. 4. Multiple pulmonary nodules in the right lung with mediastinal and right hilar lymphadenopathy, possible metastasis. 5. Multiple hepatic lesions and scattered nodules in the retroperitoneum bilaterally, concerning for metastatic disease. 6. Stable postsurgical changes of left pneumonectomy. 7. Increasing lucencies in the bones, compatible with known metastatic disease. Stable rib fracture at T9 on the left. 8. Aortic  atherosclerosis.       Disposition Plan  :    Status is: Inpatient  DVT Prophylaxis  :    enoxaparin (LOVENOX) injection 40 mg Start: 05/11/22 1000    Lab Results  Component Value Date   PLT 212 05/12/2022    Diet :  Diet Order             Diet Heart Room service appropriate? Yes; Fluid consistency: Thin; Fluid restriction: 1500 mL Fluid  Diet effective now                    Inpatient Medications  Scheduled Meds:  aspirin EC  81 mg Oral Daily   atorvastatin  10 mg Oral QHS   clopidogrel  75 mg Oral Daily   cyanocobalamin  1,000 mcg Oral Daily   digoxin  0.25 mg Intravenous Once   enoxaparin (LOVENOX) injection  40 mg Subcutaneous W41L   folic acid  1 mg Oral Daily   guaiFENesin  600 mg Oral BID   ipratropium-albuterol  3 mL Nebulization Q6H   levofloxacin  750 mg Oral Daily   loratadine  10 mg Oral Daily   metoprolol tartrate  50 mg Oral BID   midodrine  10 mg Oral TID WC   pantoprazole  40 mg Oral Daily   pregabalin  75 mg Oral BID   senna-docusate  1 tablet Oral QHS   thiamine  100 mg Oral Daily   Continuous Infusions:   PRN Meds:.acetaminophen, ALPRAZolam, oxyCODONE-acetaminophen **AND** oxyCODONE, polyethylene glycol, prochlorperazine   Objective:   Vitals:   05/12/22 0809 05/12/22 0847 05/12/22 0952 05/12/22 0953  BP: 119/66     Pulse: 92  (!) 114 (!) 123  Resp: 16     Temp: 98 F (36.7 C)     TempSrc: Oral     SpO2: 98% 96% (!) 88% 96%    Wt Readings from Last 3 Encounters:  05/10/22  78.7 kg  05/07/22 78.7 kg  05/05/22 80.7 kg     Intake/Output Summary (Last 24 hours) at 05/12/2022 1120 Last data filed at 05/12/2022 0956 Gross per 24 hour  Intake --  Output 550 ml  Net -550 ml     Physical Exam  Awake Alert, No new F.N deficits, Normal affect [place].AT,PERRAL Supple Neck, No JVD,   Symmetrical Chest wall movement, Good air movement bilaterally, CTAB RRR,No Gallops, Rubs or new Murmurs,  +ve B.Sounds, Abd Soft, No  tenderness,   No Cyanosis, Clubbing or edema         Data Review:    Recent Labs  Lab 05/05/22 1436 05/10/22 1830 05/11/22 0552 05/12/22 0206 05/12/22 1002  WBC 15.9* 50.5* 44.5* 27.5*  --   HGB 12.5* 9.8* 9.6* 8.1* 9.4*  HCT 36.7* 27.9* 27.5* 24.0* 27.0*  PLT 423* 262 247 212  --   MCV 98.4 98.6 99.6 100.0  --   MCH 33.5 34.6* 34.8* 33.8  --   MCHC 34.1 35.1 34.9 33.8  --   RDW 13.2 13.2 13.1 13.1  --   LYMPHSABS 2.6 1.3 0.7 1.0  --   MONOABS 1.6* 0.3 0.5 1.1*  --   EOSABS 0.8* 0.1 0.3 0.3  --   BASOSABS 0.1 0.1 0.1 0.1  --     Recent Labs  Lab 05/05/22 1436 05/10/22 1830 05/11/22 0340 05/11/22 0552 05/11/22 0557 05/12/22 0206  NA 135 130* 132*  --   --  131*  K 4.5 4.0 4.1  --   --  4.4  CL 94* 96* 96*  --   --  92*  CO2 28 24 27   --   --  28  GLUCOSE 108* 136* 146*  --   --  138*  BUN 24* 19 19  --   --  22  CREATININE 1.31* 1.18 1.16  --   --  1.35*  AST 28 26 25   --   --  21  ALT 56* 27 25  --   --  20  ALKPHOS 75 96 103  --   --  99  BILITOT 0.6 0.6 0.4  --   --  0.5  ALBUMIN 4.0 2.8* 2.7*  --   --  2.5*  CRP  --   --   --  8.6*  --   --   PROCALCITON  --   --   --  0.82  --   --   BNP  --  300.4*  --   --  314.6* 329.7*  MG  --  1.0* 2.7*  --   --  1.7  CALCIUM 8.9 7.7* 8.0*  --   --  8.8*   Radiology Reports DG Chest Port 1 View  Result Date: 05/12/2022 CLINICAL DATA:  66 year old male with hypoxia. History of lung cancer, left pneumonectomy. EXAM: PORTABLE CHEST 1 VIEW COMPARISON:  Chest CTA 05/10/2022 and earlier. FINDINGS: Portable AP semi upright view at 0631 hours. Coarse reticulonodular opacity in the right lung does appear progressed since last month and corresponds to abnormal lung opacity on the CTA 2 days ago. Layering small to moderate right pleural effusion at that time better demonstrated on CT. Surgically absent left lung with stable leftward shift of the mediastinum. Calcified aortic atherosclerosis. Stable visualized osseous structures.  IMPRESSION: 1. Coarse reticulonodular opacity in the right lung corresponding to abnormal opacity on CTA 2 days ago more suspicious for acute infection than pulmonary edema. Right pleural effusion better  demonstrated on CT. 2. Left pneumonectomy. Electronically Signed   By: Genevie Ann M.D.   On: 05/12/2022 06:56   CT Angio Chest PE W and/or Wo Contrast  Result Date: 05/10/2022 CLINICAL DATA:  Pulmonary embolism suspected, high probability. Increasing shortness of breath. History of lung cancer and currently being treated. Left lung removed. EXAM: CT ANGIOGRAPHY CHEST WITH CONTRAST TECHNIQUE: Multidetector CT imaging of the chest was performed using the standard protocol during bolus administration of intravenous contrast. Multiplanar CT image reconstructions and MIPs were obtained to evaluate the vascular anatomy. RADIATION DOSE REDUCTION: This exam was performed according to the departmental dose-optimization program which includes automated exposure control, adjustment of the mA and/or kV according to patient size and/or use of iterative reconstruction technique. CONTRAST:  73mL OMNIPAQUE IOHEXOL 350 MG/ML SOLN COMPARISON:  04/11/2022. FINDINGS: Cardiovascular: Heart is enlarged and there is a small pericardial effusion. Multi-vessel coronary artery calcifications are noted. There is atherosclerotic calcification of the aorta without evidence aneurysm. Pulmonary trunk is distended suggesting underlying pulmonary artery hypertension. No evidence of pulmonary embolism. Evaluation of the subsegmental arteries is limited. Mediastinum/Nodes: A conglomeration of enlarged lymph nodes are present in the mediastinum and right hilum, measuring up to 0 4.6 x 2.5 cm in the precarinal space versus 2.8 x 4.2 cm on the previous exam. No axillary lymphadenopathy. The thyroid gland, trachea, and esophagus are within normal limits. Lungs/Pleura: Status post left pneumonectomy with chronic fibrothorax. There is a small right  pleural effusion. Hazy attenuation is noted in the right lung. Multiple pulmonary nodules are seen in the right lung. An azygous lobe is noted. Patchy infiltrates are noted in the posterior aspect of the right upper lobe and right middle lobe. No pneumothorax. Upper Abdomen: Multiple ill-defined hypodensities are present in the liver measuring up to 2.4 cm. There is a left adrenal nodule measuring 2.1 cm, previously characterized as an adenoma. There is nodular thickening of the right adrenal gland. Scattered nodules are noted in the retroperitoneum bilaterally. Musculoskeletal: Scattered lucencies are noted in the bones, compatible with known metastatic disease and increased in size and number from the previous exam. There is a stable compression deformity in the superior endplate at T9. Stable postsurgical changes in the left ribs. Stable fracture in the T9 rib on the left. Stable old healed rib fractures on the right. Review of the MIP images confirms the above findings. IMPRESSION: 1. No evidence of pulmonary embolism. 2. Hazy attenuation in the right lung with patchy infiltrates in the right upper and middle lobes. 3. Small right pleural effusion. 4. Multiple pulmonary nodules in the right lung with mediastinal and right hilar lymphadenopathy, possible metastasis. 5. Multiple hepatic lesions and scattered nodules in the retroperitoneum bilaterally, concerning for metastatic disease. 6. Stable postsurgical changes of left pneumonectomy. 7. Increasing lucencies in the bones, compatible with known metastatic disease. Stable rib fracture at T9 on the left. 8. Aortic atherosclerosis. Electronically Signed   By: Brett Fairy M.D.   On: 05/10/2022 20:24   DG Chest 2 View  Result Date: 05/10/2022 CLINICAL DATA:  Shortness of breath, lung carcinoma EXAM: CHEST - 2 VIEW COMPARISON:  Previous studies including the examination of 04/28/2022 FINDINGS: There is previous left pneumonectomy. There is a interval increase  in interstitial markings throughout the right lung. There is 1.5 cm nodular density in the right apical region with no significant change. Azygous fissure is seen in right upper lung field. There is minimal blunting of right lateral CP angle. IMPRESSION: There is significant  interval increase in interstitial markings in right lung suggesting interstitial pneumonia or interstitial edema. Status post left pneumonectomy. Small right pleural effusion. There is 1.5 cm nodule in right apical region with no significant change. Electronically Signed   By: Elmer Picker M.D.   On: 05/10/2022 15:48      Signature  -   Lala Lund M.D on 05/12/2022 at 11:20 AM   -  To page go to [email or web address]

## 2022-05-13 ENCOUNTER — Inpatient Hospital Stay (HOSPITAL_COMMUNITY): Payer: Medicare Other

## 2022-05-13 ENCOUNTER — Inpatient Hospital Stay: Payer: Medicare Other

## 2022-05-13 DIAGNOSIS — J9621 Acute and chronic respiratory failure with hypoxia: Secondary | ICD-10-CM | POA: Diagnosis not present

## 2022-05-13 LAB — OSMOLALITY, URINE: Osmolality, Ur: 324 mOsm/kg (ref 300–900)

## 2022-05-13 LAB — CBC WITH DIFFERENTIAL/PLATELET
Abs Immature Granulocytes: 0 10*3/uL (ref 0.00–0.07)
Basophils Absolute: 0 10*3/uL (ref 0.0–0.1)
Basophils Relative: 0 %
Eosinophils Absolute: 0.5 10*3/uL (ref 0.0–0.5)
Eosinophils Relative: 3 %
HCT: 24.9 % — ABNORMAL LOW (ref 39.0–52.0)
Hemoglobin: 8.6 g/dL — ABNORMAL LOW (ref 13.0–17.0)
Lymphocytes Relative: 7 %
Lymphs Abs: 1.1 10*3/uL (ref 0.7–4.0)
MCH: 33.3 pg (ref 26.0–34.0)
MCHC: 34.5 g/dL (ref 30.0–36.0)
MCV: 96.5 fL (ref 80.0–100.0)
Monocytes Absolute: 1.1 10*3/uL — ABNORMAL HIGH (ref 0.1–1.0)
Monocytes Relative: 7 %
Neutro Abs: 12.9 10*3/uL — ABNORMAL HIGH (ref 1.7–7.7)
Neutrophils Relative %: 83 %
Platelets: 214 10*3/uL (ref 150–400)
RBC: 2.58 MIL/uL — ABNORMAL LOW (ref 4.22–5.81)
RDW: 13 % (ref 11.5–15.5)
WBC: 15.5 10*3/uL — ABNORMAL HIGH (ref 4.0–10.5)
nRBC: 0 % (ref 0.0–0.2)
nRBC: 0 /100 WBC

## 2022-05-13 LAB — COMPREHENSIVE METABOLIC PANEL
ALT: 16 U/L (ref 0–44)
AST: 19 U/L (ref 15–41)
Albumin: 2.5 g/dL — ABNORMAL LOW (ref 3.5–5.0)
Alkaline Phosphatase: 97 U/L (ref 38–126)
Anion gap: 9 (ref 5–15)
BUN: 21 mg/dL (ref 8–23)
CO2: 30 mmol/L (ref 22–32)
Calcium: 8.6 mg/dL — ABNORMAL LOW (ref 8.9–10.3)
Chloride: 90 mmol/L — ABNORMAL LOW (ref 98–111)
Creatinine, Ser: 1.64 mg/dL — ABNORMAL HIGH (ref 0.61–1.24)
GFR, Estimated: 45 mL/min — ABNORMAL LOW (ref >60)
Glucose, Bld: 136 mg/dL — ABNORMAL HIGH (ref 70–99)
Potassium: 4.2 mmol/L (ref 3.5–5.1)
Sodium: 129 mmol/L — ABNORMAL LOW (ref 135–145)
Total Bilirubin: 0.4 mg/dL (ref 0.3–1.2)
Total Protein: 5.4 g/dL — ABNORMAL LOW (ref 6.5–8.1)

## 2022-05-13 LAB — SODIUM, URINE, RANDOM: Sodium, Ur: 64 mmol/L

## 2022-05-13 LAB — BRAIN NATRIURETIC PEPTIDE: B Natriuretic Peptide: 528.8 pg/mL — ABNORMAL HIGH (ref 0.0–100.0)

## 2022-05-13 LAB — CREATININE, URINE, RANDOM: Creatinine, Urine: 59 mg/dL

## 2022-05-13 LAB — URIC ACID: Uric Acid, Serum: 7.2 mg/dL (ref 3.7–8.6)

## 2022-05-13 LAB — MAGNESIUM: Magnesium: 1.4 mg/dL — ABNORMAL LOW (ref 1.7–2.4)

## 2022-05-13 LAB — OSMOLALITY: Osmolality: 285 mOsm/kg (ref 275–295)

## 2022-05-13 MED ORDER — MAGNESIUM SULFATE 2 GM/50ML IV SOLN
2.0000 g | Freq: Once | INTRAVENOUS | Status: AC
  Administered 2022-05-13: 2 g via INTRAVENOUS
  Filled 2022-05-13: qty 50

## 2022-05-13 MED ORDER — MAGNESIUM SULFATE 4 GM/100ML IV SOLN
4.0000 g | Freq: Once | INTRAVENOUS | Status: AC
  Administered 2022-05-13: 4 g via INTRAVENOUS
  Filled 2022-05-13: qty 100

## 2022-05-13 MED ORDER — IPRATROPIUM-ALBUTEROL 0.5-2.5 (3) MG/3ML IN SOLN: 3.0000 mL | Freq: Four times a day (QID) | RESPIRATORY_TRACT | Status: DC

## 2022-05-13 MED ORDER — FUROSEMIDE 10 MG/ML IJ SOLN
20.0000 mg | Freq: Once | INTRAMUSCULAR | Status: AC
  Administered 2022-05-13: 20 mg via INTRAVENOUS
  Filled 2022-05-13: qty 2

## 2022-05-13 NOTE — Progress Notes (Signed)
Physical Therapy Treatment Patient Details Name: Benjamin Case MRN: [id number] DOB: 07/25/1955 Today's Date: 05/13/2022   History of Present Illness Pt is a 65 y.o. M who presents 05/10/2022 with acute on chronic hypoxic respiratory failure in a patient with metastatic lung CA. Recent admission for PNA underlying COPD and ongoing smoking. Significant PMH: stage IV metastatic lung CA, paroxysmal A-fib, previously on Eliquis, stopped taking DOAC on 05/02/22 after GIB, CVAs, chronic hypoxia on 2L O2 continuously.    PT Comments    Pt was seen for progression of mobility on hallway with O2 and sat monitor in close observation.  Pt is demonstrating a low tolerance for sats with mobility as just standing dropped him to 89%.  Increased flow to 4L for mobility on hallway and reminded pursed lip breathing the entire way.  Pt is minimally able to have conversation without dropping so will recommend him to follow up with PCP and pulm care for management of this.  While PT is not needed, may be a good candidate for pulm rehab program to progress this activity management with his O2.   Recommendations for follow up therapy are one component of a multi-disciplinary discharge planning process, led by the attending physician.  Recommendations may be updated based on patient status, additional functional criteria and insurance authorization.  Follow Up Recommendations  No PT follow up     Assistance Recommended at Discharge PRN  Patient can return home with the following Assistance with cooking/housework;Assist for transportation;Help with stairs or ramp for entrance   Equipment Recommendations  None recommended by PT    Recommendations for Other Services       Precautions / Restrictions Precautions Precautions: Other (comment) Precaution Comments: watch O2 and HR Restrictions Weight Bearing Restrictions: No     Mobility  Bed Mobility Overal bed mobility: Modified Independent                   Transfers Overall transfer level: Needs assistance Equipment used: None Transfers: Sit to/from Stand Sit to Stand: Supervision           General transfer comment: monitored sats with exertion to stand and titrated to 4L to maintain effort    Ambulation/Gait Ambulation/Gait assistance: Min guard Gait Distance (Feet): 90 Feet Assistive device: None Gait Pattern/deviations: Decreased stride length Gait velocity: reduced Gait velocity interpretation: <1.31 ft/sec, indicative of household ambulator Pre-gait activities: standing sat ck General Gait Details: minimal effort to control balance but desats without cues for pursed lip breathing and minimal conversation   Stairs             Wheelchair Mobility    Modified Rankin (Stroke Patients Only)       Balance Overall balance assessment: Needs assistance Sitting-balance support: Feet supported Sitting balance-Leahy Scale: Normal     Standing balance support: No upper extremity supported Standing balance-Leahy Scale: Good                              Cognition Arousal/Alertness: Awake/alert Behavior During Therapy: WFL for tasks assessed/performed Overall Cognitive Status: Within Functional Limits for tasks assessed                                          Exercises      General Comments General comments (skin integrity, edema, etc.): HR was up to  122 and sats down to 88% with pursed lip breathing and 4L keeping to 90 or greater %      Pertinent Vitals/Pain Pain Assessment Pain Assessment: No/denies pain    Home Living                          Prior Function            PT Goals (current goals can now be found in the care plan section) Acute Rehab PT Goals Patient Stated Goal: to return home when ready Progress towards PT goals: Progressing toward goals    Frequency    Min 3X/week      PT Plan Current plan remains appropriate     Co-evaluation              AM-PAC PT "6 Clicks" Mobility   Outcome Measure  Help needed turning from your back to your side while in a flat bed without using bedrails?: None Help needed moving from lying on your back to sitting on the side of a flat bed without using bedrails?: None Help needed moving to and from a bed to a chair (including a wheelchair)?: None Help needed standing up from a chair using your arms (e.g., wheelchair or bedside chair)?: None Help needed to walk in hospital room?: A Little Help needed climbing 3-5 steps with a railing? : A Little 6 Click Score: 22    End of Session Equipment Utilized During Treatment: Oxygen Activity Tolerance: Patient tolerated treatment well Patient left: in bed;with call bell/phone within reach Nurse Communication: Mobility status PT Visit Diagnosis: Difficulty in walking, not elsewhere classified (R26.2)     Time: 7371-0626 PT Time Calculation (min) (ACUTE ONLY): 20 min  Charges:  $Gait Training: 8-22 mins       Ramond Dial 05/13/2022, 5:08 PM  Mee Hives, PT PhD Acute Rehab Dept. Number: [facility]

## 2022-05-13 NOTE — Plan of Care (Signed)

## 2022-05-13 NOTE — Progress Notes (Signed)
PROGRESS NOTE                                                                                                                                                                                                             Patient Demographics:    Madsen Riddle, is a 66 y.o. male, DOB - 1955-10-24, DGU:[id number]  Outpatient Primary MD for the patient is [name], MD    LOS - 3  Admit date - 05/10/2022    No chief complaint on file.      Brief Narrative (HPI from H&P)   66 y.o. male with medical history significant for Stage IV metastatic lung cancer, paroxysmal A-fib previously on Eliquis, stopped taking the DOAC on 05/02/22 after GI bleed, CVAs saw medical neurologist [name] on 05/10/2022 and was placed on dual antiplatelet therapy, chronic hypoxia on 2 L nasal cannula continuously, currently hospitalized for pneumonia, he now presents with progressive shortness of breath, workup in the ER suggestive of metastatic lung cancer with possibility of reoccurrence of pneumonia.  He was admitted to the hospital for further care.     Subjective:    Patient in bed, appears comfortable, denies any headache, no fever, no chest pain or pressure, no shortness of breath , no abdominal pain. No new focal weakness.    Assessment  & Plan :   Acute on chronic hypoxic respiratory failure in a patient with metastatic lung cancer recent admission for pneumonia underlying COPD and ongoing smoking. He will be admitted to the hospital, continue supportive care with oxygen and nebulizer treatments, he is MRSA negative on his nasal PCR, also has elevated BNP.  CTA negative for PE.  I think his presentation is a mixture of possible continued pneumonia along with some element of CHF, also has metastatic stage IV lung cancer.  He will be continued on antibiotics today will be titrated down as he is MRSA negative, will gently diurese.  Monitor closely  long-term prognosis is extremely guarded.  Acute on chronic diastolic CHF EF 95%.  New beta-blocker as tolerated by blood pressure along with Lasix for diuresis.  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  Supportive care under the care of [name], long-term prognosis again extremely poor.  Palliative care involved.  Paroxysmal atrial fibrillation with [place] vas 2 score of 5.  placed on beta-blocker for rate control, 2 doses of digoxin on 05/12/2022, anticoagulation was discontinued due to GI bleed.  Continue to monitor.  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  Has seen [name], on dual antiplatelet therapy and statin for secondary prevention.  Chronic alcohol abuse and smoking.  Counseled to quit both.  Monitor for DTs.  Per patient and wife no alcohol in the last 30 days.  COPD.  Supportive care.  On home oxygen.  GERD and recent GI bleed.  On PPI.  Leukocytosis.  Somewhat chronic.  Ongoing chemotherapy, getting Neulasta.  Hyponatremia with AKI.  Initially fluid overload due to CHF, CHF symptoms much improved with diuresis, will obtain urine electrolytes request nephrology to evaluate and decide further fluid management.  Hypomagnesemia. Replaced.      Condition - Extremely Guarded  Family Communication  :  wife Vaughan Basta [phone]  on 05/11/22, bedside 05/13/22  Code Status :  Full  Consults  :  Pall care, nephrology  PUD Prophylaxis : PPI   Procedures  :     CTA - 1. No evidence of pulmonary embolism. 2. Hazy attenuation in the right lung with patchy infiltrates in the right upper and middle lobes. 3. Small right pleural effusion. 4. Multiple pulmonary nodules in the right lung with mediastinal and right hilar lymphadenopathy, possible metastasis. 5. Multiple hepatic lesions and scattered nodules in  the retroperitoneum bilaterally, concerning for metastatic disease. 6. Stable postsurgical changes of left pneumonectomy. 7. Increasing lucencies in the bones, compatible with known metastatic disease. Stable rib fracture at T9 on the left. 8. Aortic atherosclerosis.       Disposition Plan  :    Status is: Inpatient  DVT Prophylaxis  :    enoxaparin (LOVENOX) injection 40 mg Start: 05/11/22 1000    Lab Results  Component Value Date   PLT 214 05/13/2022    Diet :  Diet Order             Diet Heart Room service appropriate? Yes; Fluid consistency: Thin; Fluid restriction: 1200 mL Fluid  Diet effective now                    Inpatient Medications  Scheduled Meds:  ALPRAZolam  1 mg Oral QHS   aspirin EC  81 mg Oral Daily   atorvastatin  10 mg Oral QHS   clopidogrel  75 mg Oral Daily   cyanocobalamin  1,000 mcg Oral Daily   enoxaparin (LOVENOX) injection  40 mg Subcutaneous [id number]   folic acid  1 mg Oral Daily   guaiFENesin  600 mg Oral BID   ipratropium-albuterol  3 mL Nebulization QID   loratadine  10 mg Oral Daily   metoprolol tartrate  50 mg Oral BID   midodrine  10 mg Oral TID WC   pantoprazole  40 mg Oral Daily   pregabalin  75 mg Oral BID   senna-docusate  1 tablet Oral QHS   thiamine  100 mg Oral Daily   Continuous Infusions:  magnesium sulfate bolus IVPB     magnesium sulfate bolus IVPB      PRN Meds:.acetaminophen, ALPRAZolam, oxyCODONE-acetaminophen **AND** oxyCODONE, polyethylene glycol, prochlorperazine, traMADol   Objective:   Vitals:   05/13/22 0313 05/13/22 0440 05/13/22 0732 05/13/22 0810  BP: (!) 99/58 109/68  123/67  Pulse: 80 79  89  Resp:  18  18  Temp:  97.6 F (36.4 C)  98.5 F (36.9 C)  TempSrc:  Oral  Oral  SpO2: 96% 97% 94% 99%    Wt Readings from Last 3 Encounters:  05/10/22 78.7 kg  05/07/22 78.7 kg  05/05/22 80.7 kg     Intake/Output Summary (Last 24 hours) at 05/13/2022 0940 Last data filed at 05/13/2022 0100 Gross  per 24 hour  Intake 600 ml  Output 1075 ml  Net -475 ml     Physical Exam  Awake Alert, No new F.N deficits, Normal affect [place].AT,PERRAL Supple Neck, No JVD,   Symmetrical Chest wall movement, Good air movement bilaterally, few rales RRR,No Gallops, Rubs or new Murmurs,  +ve B.Sounds, Abd Soft, No tenderness,   No Cyanosis, Clubbing or edema        Data Review:    Recent Labs  Lab 05/10/22 1830 05/11/22 0552 05/12/22 0206 05/12/22 1002 05/13/22 0253  WBC 50.5* 44.5* 27.5*  --  15.5*  HGB 9.8* 9.6* 8.1* 9.4* 8.6*  HCT 27.9* 27.5* 24.0* 27.0* 24.9*  PLT 262 247 212  --  214  MCV 98.6 99.6 100.0  --  96.5  MCH 34.6* 34.8* 33.8  --  33.3  MCHC 35.1 34.9 33.8  --  34.5  RDW 13.2 13.1 13.1  --  13.0  LYMPHSABS 1.3 0.7 1.0  --  1.1  MONOABS 0.3 0.5 1.1*  --  1.1*  EOSABS 0.1 0.3 0.3  --  0.5  BASOSABS 0.1 0.1 0.1  --  0.0    Recent Labs  Lab 05/10/22 1830 05/11/22 0340 05/11/22 0552 05/11/22 0557 05/12/22 0206 05/13/22 0253  NA 130* 132*  --   --  131* 129*  K 4.0 4.1  --   --  4.4 4.2  CL 96* 96*  --   --  92* 90*  CO2 24 27  --   --  28 30  GLUCOSE 136* 146*  --   --  138* 136*  BUN 19 19  --   --  22 21  CREATININE 1.18 1.16  --   --  1.35* 1.64*  AST 26 25  --   --  21 19  ALT 27 25  --   --  20 16  ALKPHOS 96 103  --   --  99 97  BILITOT 0.6 0.4  --   --  0.5 0.4  ALBUMIN 2.8* 2.7*  --   --  2.5* 2.5*  CRP  --   --  8.6*  --   --   --   PROCALCITON  --   --  0.82  --   --   --   BNP 300.4*  --   --  314.6* 329.7* 528.8*  MG 1.0* 2.7*  --   --  1.7 1.4*  CALCIUM 7.7* 8.0*  --   --  8.8* 8.6*   Radiology Reports DG Chest Port 1 View  Result Date: 05/12/2022 CLINICAL DATA:  66 year old male with hypoxia. History of lung cancer, left pneumonectomy. EXAM: PORTABLE CHEST 1 VIEW COMPARISON:  Chest CTA 05/10/2022 and earlier. FINDINGS: Portable AP semi upright view at 0631 hours. Coarse reticulonodular opacity in the right lung does appear progressed since  last month and corresponds to abnormal lung opacity on the CTA 2 days ago. Layering small to moderate right pleural effusion at that time better demonstrated on CT. Surgically absent left lung with stable leftward shift of  the mediastinum. Calcified aortic atherosclerosis. Stable visualized osseous structures. IMPRESSION: 1. Coarse reticulonodular opacity in the right lung corresponding to abnormal opacity on CTA 2 days ago more suspicious for acute infection than pulmonary edema. Right pleural effusion better demonstrated on CT. 2. Left pneumonectomy. Electronically Signed   By: Genevie Ann M.D.   On: 05/12/2022 06:56   CT Angio Chest PE W and/or Wo Contrast  Result Date: 05/10/2022 CLINICAL DATA:  Pulmonary embolism suspected, high probability. Increasing shortness of breath. History of lung cancer and currently being treated. Left lung removed. EXAM: CT ANGIOGRAPHY CHEST WITH CONTRAST TECHNIQUE: Multidetector CT imaging of the chest was performed using the standard protocol during bolus administration of intravenous contrast. Multiplanar CT image reconstructions and MIPs were obtained to evaluate the vascular anatomy. RADIATION DOSE REDUCTION: This exam was performed according to the departmental dose-optimization program which includes automated exposure control, adjustment of the mA and/or kV according to patient size and/or use of iterative reconstruction technique. CONTRAST:  31mL OMNIPAQUE IOHEXOL 350 MG/ML SOLN COMPARISON:  04/11/2022. FINDINGS: Cardiovascular: Heart is enlarged and there is a small pericardial effusion. Multi-vessel coronary artery calcifications are noted. There is atherosclerotic calcification of the aorta without evidence aneurysm. Pulmonary trunk is distended suggesting underlying pulmonary artery hypertension. No evidence of pulmonary embolism. Evaluation of the subsegmental arteries is limited. Mediastinum/Nodes: A conglomeration of enlarged lymph nodes are present in the mediastinum  and right hilum, measuring up to 0 4.6 x 2.5 cm in the precarinal space versus 2.8 x 4.2 cm on the previous exam. No axillary lymphadenopathy. The thyroid gland, trachea, and esophagus are within normal limits. Lungs/Pleura: Status post left pneumonectomy with chronic fibrothorax. There is a small right pleural effusion. Hazy attenuation is noted in the right lung. Multiple pulmonary nodules are seen in the right lung. An azygous lobe is noted. Patchy infiltrates are noted in the posterior aspect of the right upper lobe and right middle lobe. No pneumothorax. Upper Abdomen: Multiple ill-defined hypodensities are present in the liver measuring up to 2.4 cm. There is a left adrenal nodule measuring 2.1 cm, previously characterized as an adenoma. There is nodular thickening of the right adrenal gland. Scattered nodules are noted in the retroperitoneum bilaterally. Musculoskeletal: Scattered lucencies are noted in the bones, compatible with known metastatic disease and increased in size and number from the previous exam. There is a stable compression deformity in the superior endplate at T9. Stable postsurgical changes in the left ribs. Stable fracture in the T9 rib on the left. Stable old healed rib fractures on the right. Review of the MIP images confirms the above findings. IMPRESSION: 1. No evidence of pulmonary embolism. 2. Hazy attenuation in the right lung with patchy infiltrates in the right upper and middle lobes. 3. Small right pleural effusion. 4. Multiple pulmonary nodules in the right lung with mediastinal and right hilar lymphadenopathy, possible metastasis. 5. Multiple hepatic lesions and scattered nodules in the retroperitoneum bilaterally, concerning for metastatic disease. 6. Stable postsurgical changes of left pneumonectomy. 7. Increasing lucencies in the bones, compatible with known metastatic disease. Stable rib fracture at T9 on the left. 8. Aortic atherosclerosis. Electronically Signed   By: Brett Fairy M.D.   On: 05/10/2022 20:24   DG Chest 2 View  Result Date: 05/10/2022 CLINICAL DATA:  Shortness of breath, lung carcinoma EXAM: CHEST - 2 VIEW COMPARISON:  Previous studies including the examination of 04/28/2022 FINDINGS: There is previous left pneumonectomy. There is a interval increase in interstitial markings throughout the  right lung. There is 1.5 cm nodular density in the right apical region with no significant change. Azygous fissure is seen in right upper lung field. There is minimal blunting of right lateral CP angle. IMPRESSION: There is significant interval increase in interstitial markings in right lung suggesting interstitial pneumonia or interstitial edema. Status post left pneumonectomy. Small right pleural effusion. There is 1.5 cm nodule in right apical region with no significant change. Electronically Signed   By: Elmer Picker M.D.   On: 05/10/2022 15:48      Signature  -   Lala Lund M.D on 05/13/2022 at 9:40 AM   -  To page go to [email or web address]

## 2022-05-13 NOTE — Plan of Care (Signed)

## 2022-05-13 NOTE — Care Management Important Message (Signed)
Important Message  Patient Details  Name: Benjamin Case MRN: [id number] Date of Birth: [birth year]   Medicare Important Message Given:  Yes     Shelda Altes 05/13/2022, 1:03 PM

## 2022-05-13 NOTE — Consult Note (Addendum)
Reason for Consult: Acute kidney injury Referring Physician: Lala Lund, MD [facility])  HPI:  66 year old man with past medical history significant for stage IV metastatic lung cancer, paroxysmal atrial fibrillation (off Eliquis after developing GI bleed), chronic hypoxic respiratory failure with ongoing oxygen supplementation and recent recognition of CVA for which she was started on dual antiplatelet therapy by neurology.  His baseline creatinine appears to be 1.1-1.2.  He was admitted to the hospital 3 days ago with progressively worsening shortness of breath after being discharged around 3 weeks ago for sepsis/pneumonia.  He had a CT angiogram of the chest on 12/4 that did not show any PE but showed evidence of metastatic lung cancer.  He has had intermittent furosemide doses for increasing shortness of breath suspected to be from CHF exacerbation.  Chart review shows relative hypotension on 12/6 with blood pressures in the 90s over 60s.  Concern is raised with progressive rise of his creatinine from 1.2 on 12/5 to 1.4 yesterday and 1.6 today.  Denies any prior history of acute kidney injury, recurrent nephrolithiasis or recurrent urinary tract infections.  He has been seen by palliative care given his burden of comorbidities and poor long-term prognosis of his metastatic stage IV lung cancer.  Past Medical History:  Diagnosis Date   Arthritis    back   COPD (chronic obstructive pulmonary disease) ([place])    Diabetes mellitus without complication (HCC)    no medications , monitors cbg at home with monitor, avg no more than 150s at home    Dyspnea    increased exertion; pt states can climb flight of stairs w/o having to stop prior to reaching top ; ongoing reports it is due to "having one lung"    GERD (gastroesophageal reflux disease)    Hepatitis    hepatitis C; pt states competed treatments, unsure what medication he was treated with    History of chemotherapy    Hypertension    reports  its been over a year since he stopped taking bp medication, reports he made her medical doctor aware that he was topping it    lung ca dx'd 12/2007   lt lobectomy   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   and back - see note in [facility] 12/26/21   Numbness    FEET BILATERAL   Pneumonia 2009   Right upper lobe pulmonary nodule 04/29/2021   Rotator cuff tear    right     Past Surgical History:  Procedure Laterality Date   ARTHROSCOPIC REPAIR ACL Right    BRONCHIAL BIOPSY  06/09/2021   Procedure: BRONCHIAL BIOPSIES;  Surgeon: Garner Nash, DO;  Location: [place] [facility];  Service: Pulmonary;;   BRONCHIAL BRUSHINGS  06/09/2021   Procedure: BRONCHIAL BRUSHINGS;  Surgeon: Garner Nash, DO;  Location: [place] [facility];  Service: Pulmonary;;   BRONCHIAL WASHINGS  06/09/2021   Procedure: BRONCHIAL WASHINGS;  Surgeon: Garner Nash, DO;  Location: [place];  Service: Pulmonary;;   CATARACT EXTRACTION, BILATERAL  2019   DECOMPRESSIVE LUMBAR LAMINECTOMY LEVEL 1 N/A 03/24/2016   Procedure: L3-4 CENTRAL DECOMPRESSION LUMBER LAMINECTOMY;  Surgeon: Latanya Maudlin, MD;  Location: [facility];  Service: Orthopedics;  Laterality: N/A;   FINE NEEDLE ASPIRATION  03/23/2022   Procedure: FINE NEEDLE ASPIRATION (FNA) LINEAR;  Surgeon: Garner Nash, DO;  Location: [place] [facility];  Service: Pulmonary;;   FOOT SURGERY Bilateral    HAND SURGERY Bilateral    LOBECTOMY Left 2009   pneumonectomy Left 2009   [name]  SHOULDER ARTHROSCOPY WITH DISTAL CLAVICLE RESECTION Right 12/14/2018   Procedure: SHOULDER ARTHROSCOPY WITH DISTAL CLAVICLE RESECTION;  Surgeon: Tania Ade, MD;  Location: [facility];  Service: Orthopedics;  Laterality: Right;   SHOULDER ARTHROSCOPY WITH ROTATOR CUFF REPAIR Right 12/14/2018   Procedure: SHOULDER ARTHROSCOPY WITH ROTATOR CUFF REPAIR;  Surgeon: Tania Ade, MD;  Location: [facility];  Service: Orthopedics;  Laterality: Right;   SHOULDER ARTHROSCOPY WITH SUBACROMIAL DECOMPRESSION Right  12/14/2018   Procedure: SHOULDER ARTHROSCOPY WITH SUBACROMIAL DECOMPRESSION;  Surgeon: Tania Ade, MD;  Location: [facility];  Service: Orthopedics;  Laterality: Right;   TRIGGER FINGER RELEASE     right hand, pointer finger    VIDEO BRONCHOSCOPY WITH ENDOBRONCHIAL ULTRASOUND Right 03/23/2022   Procedure: VIDEO BRONCHOSCOPY WITH ENDOBRONCHIAL ULTRASOUND;  Surgeon: Garner Nash, DO;  Location: [place];  Service: Pulmonary;  Laterality: Right;   VIDEO BRONCHOSCOPY WITH RADIAL ENDOBRONCHIAL ULTRASOUND  06/09/2021   Procedure: VIDEO BRONCHOSCOPY WITH RADIAL ENDOBRONCHIAL ULTRASOUND;  Surgeon: Garner Nash, DO;  Location: [facility];  Service: Pulmonary;;    Family History  Problem Relation Age of Onset   Cancer Mother     Social History:  reports that he has been smoking cigarettes. He has a 22.50 pack-year smoking history. He has never used smokeless tobacco. He reports current alcohol use. He reports that he does not use drugs.  Allergies: No Known Allergies  Medications: I have reviewed the patient's current medications. Scheduled:  ALPRAZolam  1 mg Oral QHS   aspirin EC  81 mg Oral Daily   atorvastatin  10 mg Oral QHS   clopidogrel  75 mg Oral Daily   cyanocobalamin  1,000 mcg Oral Daily   enoxaparin (LOVENOX) injection  40 mg Subcutaneous J62G   folic acid  1 mg Oral Daily   guaiFENesin  600 mg Oral BID   ipratropium-albuterol  3 mL Nebulization QID   loratadine  10 mg Oral Daily   metoprolol tartrate  50 mg Oral BID   midodrine  10 mg Oral TID WC   pantoprazole  40 mg Oral Daily   pregabalin  75 mg Oral BID   senna-docusate  1 tablet Oral QHS   thiamine  100 mg Oral Daily       Latest Ref Rng & Units 05/13/2022    2:53 AM 05/12/2022    2:06 AM 05/11/2022    3:40 AM  BMP  Glucose 70 - 99 mg/dL 136  138  146   BUN 8 - 23 mg/dL 21  22  19    Creatinine 0.61 - 1.24 mg/dL 1.64  1.35  1.16   Sodium 135 - 145 mmol/L 129  131  132   Potassium 3.5 - 5.1 mmol/L  4.2  4.4  4.1   Chloride 98 - 111 mmol/L 90  92  96   CO2 22 - 32 mmol/L 30  28  27    Calcium 8.9 - 10.3 mg/dL 8.6  8.8  8.0       Latest Ref Rng & Units 05/13/2022    2:53 AM 05/12/2022   10:02 AM 05/12/2022    2:06 AM  CBC  WBC 4.0 - 10.5 K/uL 15.5   27.5   Hemoglobin 13.0 - 17.0 g/dL 8.6  9.4  8.1   Hematocrit 39.0 - 52.0 % 24.9  27.0  24.0   Platelets 150 - 400 K/uL 214   212     DG Chest Port 1 View  Result Date: 05/12/2022 CLINICAL DATA:  66 year old  male with hypoxia. History of lung cancer, left pneumonectomy. EXAM: PORTABLE CHEST 1 VIEW COMPARISON:  Chest CTA 05/10/2022 and earlier. FINDINGS: Portable AP semi upright view at 0631 hours. Coarse reticulonodular opacity in the right lung does appear progressed since last month and corresponds to abnormal lung opacity on the CTA 2 days ago. Layering small to moderate right pleural effusion at that time better demonstrated on CT. Surgically absent left lung with stable leftward shift of the mediastinum. Calcified aortic atherosclerosis. Stable visualized osseous structures. IMPRESSION: 1. Coarse reticulonodular opacity in the right lung corresponding to abnormal opacity on CTA 2 days ago more suspicious for acute infection than pulmonary edema. Right pleural effusion better demonstrated on CT. 2. Left pneumonectomy. Electronically Signed   By: Genevie Ann M.D.   On: 05/12/2022 06:56    Review of Systems  Constitutional:  Positive for appetite change and fatigue. Negative for chills and fever.  HENT:  Negative for sore throat and trouble swallowing.   Eyes:  Negative for photophobia and visual disturbance.  Respiratory:  Positive for shortness of breath. Negative for chest tightness.   Cardiovascular:  Negative for chest pain and leg swelling.  Gastrointestinal:  Negative for abdominal pain, diarrhea, nausea and vomiting.  Endocrine: Negative for polydipsia and polyuria.  Genitourinary:  Negative for dysuria, frequency, hematuria and  urgency.  Musculoskeletal:  Negative for arthralgias, back pain and myalgias.  Skin:  Negative for rash and wound.  Neurological:  Negative for dizziness, weakness and headaches.   Blood pressure 123/67, pulse 89, temperature 98.5 F (36.9 C), temperature source Oral, resp. rate 18, SpO2 99 %. Physical Exam Vitals reviewed.  Constitutional:      General: He is not in acute distress.    Appearance: Normal appearance. He is normal weight. He is not ill-appearing.  HENT:     Head: Normocephalic and atraumatic.     Right Ear: Tympanic membrane normal.     Left Ear: Tympanic membrane normal.     Nose: Nose normal. No congestion.     Mouth/Throat:     Mouth: Mucous membranes are moist.     Pharynx: Oropharynx is clear.  Eyes:     General: No scleral icterus.    Extraocular Movements: Extraocular movements intact.     Conjunctiva/sclera: Conjunctivae normal.  Neck:     Comments: JVP around 6 cm Cardiovascular:     Rate and Rhythm: Normal rate. Rhythm irregular.     Pulses: Normal pulses.     Heart sounds: Normal heart sounds.  Pulmonary:     Effort: Pulmonary effort is normal.     Breath sounds: Rales present. No wheezing.     Comments: Fine rales right base Abdominal:     General: Abdomen is flat. Bowel sounds are normal.     Palpations: Abdomen is soft.     Tenderness: There is no abdominal tenderness. There is no guarding.  Musculoskeletal:     Cervical back: Normal range of motion and neck supple.     Right lower leg: No edema.     Left lower leg: No edema.  Skin:    General: Skin is warm and dry.     Findings: No bruising or lesion.  Neurological:     General: No focal deficit present.     Mental Status: He is alert and oriented to person, place, and time.     Assessment/Plan: 1.  Acute kidney injury: Nonoliguric.  This appears to be predominantly hemodynamically mediated in the setting of  relative hypotension, recent contrast exposure and what appears to be likely  intravascular volume contraction versus ineffective arterial blood volume with some third spacing.  Based on the most recent chest x-ray findings, there is not convincing evidence that he has significant pulmonary edema to undertake additional diuresis at this time and I would recommend holding off on diuretics overnight and allowing reequilibration of his intravascular volume.  He does not have any high risk/nephrotoxic medications seen on review of his MAR at this time.  He does not have any symptoms or signs suggestive of uremia and does not have any critical electrolyte abnormality prompting intervention. Avoid nephrotoxic medications including NSAIDs and iodinated intravenous contrast exposure unless the latter is absolutely indicated.  Preferred narcotic agents for pain control are hydromorphone, fentanyl, and methadone. Morphine should not be used. Avoid Baclofen and avoid oral sodium phosphate and magnesium citrate based laxatives / bowel preps. Continue strict Input and Output monitoring. Will monitor the patient closely with you and intervene or adjust therapy as indicated by changes in clinical status/labs. 2.  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  The acute component is likely from acute kidney injury and impaired free water handling.  Will check serum osmolality/urine osmolality to corroborate clinical suspicion. 3.  Metastatic stage IV lung cancer: Continue supportive management at this time along with assistance from palliative care team.  To follow-up with oncology as an outpatient. 4.  Diastolic congestive heart failure: With earlier evidence of CHF exacerbation and status post administration of diuretics with some clinical improvement.  Chest x-ray/clinical findings at this time indicate that he is compensated from CHF standpoint and would recommend avoiding furosemide at this time. 5.  Acute  on chronic hypoxic respiratory failure: On treatment for atypical pneumonia and status post intermittent diuretics.   Garwood Wentzell K. 05/13/2022, 1:52 PM

## 2022-05-13 NOTE — TOC Initial Note (Addendum)
Transition of Care [facility]) - Initial/Assessment Note    Patient Details  Name: Benjamin Case MRN: [id number] Date of Birth: [birth year]  Transition of Care [facility]) CM/SW Contact:    Bethena Roys, RN Phone Number: 05/13/2022, 12:05 PM  Clinical Narrative:  Patient presented for shortness of breath. PTA patient was from home with spouse. Patient reports that he has oxygen 2.5 liters in the home from [employer]-concentrator is a 5 Liter. Patient is currently on 4 liters here at the hospital. Patient may benefit from an ambulatory sat prior to discharge- [employer] will need new orders if the liter flow increases. Case Manager will continue to follow for transition of care needs as the patient progresses.      1322 05-13-22 [employer] states patient has an order on file for liter flow of 2 Liters at home. Case Manager will continue to follow.          Expected Discharge Plan: Home/Self Care Barriers to Discharge: No Barriers Identified   Patient Goals and CMS Choice Patient states their goals for this hospitalization and ongoing recovery are:: plan to return home.   Expected Discharge Plan and Services Expected Discharge Plan: Home/Self Care In-house Referral: NA Discharge Planning Services: CM Consult Post Acute Care Choice: NA Living arrangements for the past 2 months: Single Family Home  HH Arranged: NA   Prior Living Arrangements/Services Living arrangements for the past 2 months: Single Family Home Lives with:: Spouse Patient language and need for interpreter reviewed:: Yes Do you feel safe going back to the place where you live?: Yes      Need for Family Participation in Patient Care: No (Comment) Care giver support system in place?: No (comment)   Criminal Activity/Legal Involvement Pertinent to Current Situation/Hospitalization: No - Comment as needed  Activities of Daily Living Home Assistive Devices/Equipment: None ADL Screening (condition at time of admission) Patient's  cognitive ability adequate to safely complete daily activities?: Yes Is the patient deaf or have difficulty hearing?: No Does the patient have difficulty seeing, even when wearing glasses/contacts?: No Does the patient have difficulty concentrating, remembering, or making decisions?: No Patient able to express need for assistance with ADLs?: Yes Does the patient have difficulty dressing or bathing?: No Independently performs ADLs?: Yes (appropriate for developmental age) Does the patient have difficulty walking or climbing stairs?: No Weakness of Legs: Both Weakness of Arms/Hands: None  Permission Sought/Granted Permission sought to share information with : Family Supports, Case Manager   Emotional Assessment Appearance:: Appears stated age Attitude/Demeanor/Rapport: Engaged Affect (typically observed): Appropriate Orientation: : Oriented to Situation, Oriented to  Time, Oriented to Place, Oriented to Self Alcohol / Substance Use: Not Applicable Psych Involvement: No (comment)  Admission diagnosis:  HAP (hospital-acquired pneumonia) [J18.9, Y95] Acute on chronic respiratory failure with hypoxia ([place]) [J96.21] Patient Active Problem List   Diagnosis Date Noted   Stroke (cerebrum) ([place]) 05/10/2022   Shortness of breath 05/10/2022   Acute on chronic respiratory failure with hypoxia ([place]) 05/10/2022   Persistent atrial fibrillation ([place]) 05/07/2022   History of embolic stroke 83/38/2505   History of GI bleed 05/07/2022   Dyspnea 04/15/2022   Hypoxia 04/15/2022   Paroxysmal atrial fibrillation ([place]) 04/15/2022   Pneumonia 04/11/2022   Metastatic primary lung cancer, right ([place]) 04/11/2022   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[place]) 04/01/2022   Adenopathy    Right upper lobe pulmonary nodule 02/22/2022   Hx of cancer of lung 02/22/2022   Lung nodules 05/11/2021  S/P arthroscopy of right shoulder 12/14/2018   Spinal stenosis, 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 [place] [address] Phone: [phone] Fax: [phone]  Readmission Risk Interventions    05/13/2022   11:58 AM 04/17/2022   11:29 AM  Readmission Risk Prevention Plan  Transportation Screening Complete Complete  PCP or Specialist Appt within 3-5 Days  Complete  HRI or Home Care Consult Complete Complete  Social Work Consult for [place] Planning/Counseling Complete Complete  Palliative Care Screening Not Applicable Not Applicable  Medication Review Press photographer) Referral to Pharmacy Complete

## 2022-05-14 ENCOUNTER — Other Ambulatory Visit (HOSPITAL_COMMUNITY): Payer: Self-pay

## 2022-05-14 DIAGNOSIS — Z7189 Other specified counseling: Secondary | ICD-10-CM | POA: Diagnosis not present

## 2022-05-14 DIAGNOSIS — Y95 Nosocomial condition: Secondary | ICD-10-CM | POA: Diagnosis not present

## 2022-05-14 DIAGNOSIS — J9621 Acute and chronic respiratory failure with hypoxia: Secondary | ICD-10-CM | POA: Diagnosis not present

## 2022-05-14 DIAGNOSIS — J189 Pneumonia, unspecified organism: Secondary | ICD-10-CM | POA: Diagnosis not present

## 2022-05-14 LAB — CBC WITH DIFFERENTIAL/PLATELET
Abs Immature Granulocytes: 0 10*3/uL (ref 0.00–0.07)
Basophils Absolute: 0 10*3/uL (ref 0.0–0.1)
Basophils Relative: 0 %
Eosinophils Absolute: 0.4 10*3/uL (ref 0.0–0.5)
Eosinophils Relative: 2 %
HCT: 25 % — ABNORMAL LOW (ref 39.0–52.0)
Hemoglobin: 8.7 g/dL — ABNORMAL LOW (ref 13.0–17.0)
Lymphocytes Relative: 17 %
Lymphs Abs: 3 10*3/uL (ref 0.7–4.0)
MCH: 33.9 pg (ref 26.0–34.0)
MCHC: 34.8 g/dL (ref 30.0–36.0)
MCV: 97.3 fL (ref 80.0–100.0)
Monocytes Absolute: 1.4 10*3/uL — ABNORMAL HIGH (ref 0.1–1.0)
Monocytes Relative: 8 %
Neutro Abs: 12.8 10*3/uL — ABNORMAL HIGH (ref 1.7–7.7)
Neutrophils Relative %: 73 %
Platelets: 210 10*3/uL (ref 150–400)
RBC: 2.57 MIL/uL — ABNORMAL LOW (ref 4.22–5.81)
RDW: 12.9 % (ref 11.5–15.5)
WBC: 17.5 10*3/uL — ABNORMAL HIGH (ref 4.0–10.5)
nRBC: 0 % (ref 0.0–0.2)
nRBC: 0 /100 WBC

## 2022-05-14 LAB — COMPREHENSIVE METABOLIC PANEL
ALT: 22 U/L (ref 0–44)
AST: 25 U/L (ref 15–41)
Albumin: 2.5 g/dL — ABNORMAL LOW (ref 3.5–5.0)
Alkaline Phosphatase: 102 U/L (ref 38–126)
Anion gap: 10 (ref 5–15)
BUN: 16 mg/dL (ref 8–23)
CO2: 30 mmol/L (ref 22–32)
Calcium: 8.6 mg/dL — ABNORMAL LOW (ref 8.9–10.3)
Chloride: 90 mmol/L — ABNORMAL LOW (ref 98–111)
Creatinine, Ser: 1.3 mg/dL — ABNORMAL HIGH (ref 0.61–1.24)
GFR, Estimated: 60 mL/min — ABNORMAL LOW (ref >60)
Glucose, Bld: 105 mg/dL — ABNORMAL HIGH (ref 70–99)
Potassium: 3.8 mmol/L (ref 3.5–5.1)
Sodium: 130 mmol/L — ABNORMAL LOW (ref 135–145)
Total Bilirubin: 0.5 mg/dL (ref 0.3–1.2)
Total Protein: 5.6 g/dL — ABNORMAL LOW (ref 6.5–8.1)

## 2022-05-14 LAB — UREA NITROGEN, URINE: Urea Nitrogen, Ur: 314 mg/dL

## 2022-05-14 LAB — BRAIN NATRIURETIC PEPTIDE: B Natriuretic Peptide: 386.3 pg/mL — ABNORMAL HIGH (ref 0.0–100.0)

## 2022-05-14 LAB — MAGNESIUM: Magnesium: 2 mg/dL (ref 1.7–2.4)

## 2022-05-14 MED ORDER — CLOPIDOGREL BISULFATE 75 MG PO TABS
75.0000 mg | ORAL_TABLET | Freq: Every day | ORAL | 1 refills | Status: AC
  Filled 2022-05-14: qty 30, 30d supply, fill #0

## 2022-05-14 MED ORDER — FUROSEMIDE 40 MG PO TABS
40.0000 mg | ORAL_TABLET | Freq: Every day | ORAL | 0 refills | Status: AC
  Filled 2022-05-14: qty 30, 30d supply, fill #0

## 2022-05-14 MED ORDER — POTASSIUM CHLORIDE CRYS ER 10 MEQ PO TBCR
10.0000 meq | EXTENDED_RELEASE_TABLET | Freq: Every day | ORAL | 0 refills | Status: AC
  Filled 2022-05-14: qty 30, 30d supply, fill #0

## 2022-05-14 NOTE — Discharge Summary (Signed)
Benjamin Case KKX:[id number] DOB: [birth year] DOA: 05/10/2022  PCP: [name], MD  Admit date: 05/10/2022  Discharge date: 05/14/2022  Admitted From: Home   Disposition:  Home   Recommendations for Outpatient Follow-up:   Follow up with PCP in 1-2 weeks  PCP Please obtain BMP/CBC, 2 view CXR in 1week,  (see Discharge instructions)   PCP Please follow up on the following pending results: Monitor electrolytes and diuretic dose closely, needs continued palliative care discussions on ongoing basis.   Home Health: None   Equipment/Devices: o2  Consultations: Renal, Pall Care Discharge Condition: Stable    CODE STATUS: Full    Diet Recommendation: Heart Healthy with strict 1.5 L fluid restriction  CC - shortness of breath    Brief history of present illness from the day of admission and additional interim summary     66 y.o. male with medical history significant for Stage IV metastatic lung cancer, paroxysmal A-fib previously on Eliquis, stopped taking the DOAC on 05/02/22 after GI bleed, CVAs saw medical neurologist [name] on 05/10/2022 and was placed on dual antiplatelet therapy, chronic hypoxia on 2 L nasal cannula continuously, currently hospitalized for pneumonia, he now presents with progressive shortness of breath, workup in the ER suggestive of metastatic lung cancer with possibility of reoccurrence of pneumonia.  He was admitted to the hospital for further care.                                                                    Hospital Course   Acute on chronic hypoxic respiratory failure in a patient with metastatic lung cancer recent admission for pneumonia underlying COPD and ongoing smoking. He will be admitted to the hospital, continue supportive care with oxygen and nebulizer treatments, he is  MRSA negative on his nasal PCR, also has elevated BNP.  CTA negative for PE.   I think his presentation is a mixture of possible continued pneumonia along with some element of CHF, also has metastatic stage IV lung cancer.  Given few days of antibiotics however his major improvement came from diuresis with IV Lasix, much improved and at baseline.  Cultures negative MRSA nasal PCR negative.  Will be discharged home with fluid restriction along with Lasix which will be started on 05/16/2022.  Currently symptom-free wife bedside agrees.  Eager to go home.   Acute on chronic diastolic CHF EF 16%.  Much improved after diuresis, now symptom-free eager to go home states he feels as good as he feels at home on a good day, if qualifies for home oxygen and daytime will receive, continue home dose beta-blocker, requested to be on 1.5 L fluid restriction daily, low-dose Lasix started from 05/16/2022 PCP to monitor electrolytes and diuretic dose closely.   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  Supportive care under the care of [name], long-term prognosis again extremely poor.  Palliative care involved.  He has extremely poor understanding of his health situation, somewhat unrealistic.   Paroxysmal atrial fibrillation with [place] vas 2 score of 5.  placed on beta-blocker for rate control, 2 doses of digoxin on 05/12/2022, anticoagulation was discontinued due to GI bleed.  Rate stable.   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  Has seen [name], on dual antiplatelet therapy and statin for secondary prevention.  Eliquis was discontinued recently due to GI bleed.   Chronic alcohol abuse and smoking.  Counseled to quit both.  Monitor for DTs.  Per patient and wife no alcohol in the last 30 days.   COPD.  Supportive care.  On home  oxygen.   GERD and recent GI bleed.  On PPI.   Leukocytosis.  Somewhat chronic.  Ongoing chemotherapy, getting Neulasta.   Hyponatremia with AKI.  Initially fluid overload due to CHF, AKI likely due to transient hypotension, improved, Lasix was held seen by nephrology.  Renal ultrasound stable.  Renal function much improved will be discharged home will be given oral Lasix but will give 2-day holiday.  PCP to monitor electrolytes and diuretic dose closely.   Hypomagnesemia. Replaced.    Discharge diagnosis     Principal Problem:   Acute on chronic respiratory failure with hypoxia [facility])    Discharge instructions    Discharge Instructions     Discharge instructions   Complete by: As directed    Follow with Primary MD [name], MD in 7 days   Get CBC, CMP, 2 view Chest X ray -  checked next visit with your primary MD   Activity: As tolerated with Full fall precautions use walker/cane & assistance as needed  Disposition Home    Diet: Heart Healthy with strict 1.5 L fluid restriction per day  Check your Weight same time everyday, if you gain over 2 pounds, or you develop in leg swelling, experience more shortness of breath or chest pain, call your Primary MD immediately. Follow Cardiac Low Salt Diet and 1.5 lit/day fluid restriction.  Special Instructions: If you have smoked or chewed Tobacco  in the last 2 yrs please stop smoking, stop any regular Alcohol  and or any Recreational drug use.  On your next visit with your primary care physician please Get Medicines reviewed and adjusted.  Please request your Prim.MD to go over all Hospital Tests and Procedure/Radiological results at the follow up, please get all Hospital records sent to your Prim MD by signing hospital release before you go home.  If you experience worsening of your admission symptoms, develop shortness of breath, life threatening emergency, suicidal or homicidal thoughts you must seek medical attention  immediately by calling 911 or calling your MD immediately  if symptoms less severe.  You Must read complete instructions/literature along with all the possible adverse reactions/side effects for all the Medicines you take and that have been prescribed to you. Take any new Medicines after you have completely understood and accpet all the possible adverse reactions/side effects.   For home use only DME oxygen   Complete by: As directed    Length of Need: 6 Months   Mode or (Route): Nasal cannula   Liters per Minute: 2   Frequency:  Continuous (stationary and portable oxygen unit needed)   Oxygen conserving device: Yes   Oxygen delivery system: Gas   Increase activity slowly   Complete by: As directed        Discharge Medications   Allergies as of 05/14/2022   No Known Allergies      Medication List     STOP taking these medications    BC Fast Pain Relief 845-65 MG Pack Generic drug: Aspirin-Caffeine   Eliquis 5 MG Tabs tablet Generic drug: apixaban   meloxicam 15 MG tablet Commonly known as: MOBIC   prochlorperazine 10 MG tablet Commonly known as: COMPAZINE       TAKE these medications    acetaminophen 650 MG CR tablet Commonly known as: TYLENOL Take 1,300 mg by mouth every 8 (eight) hours as needed for pain.   ALPRAZolam 1 MG tablet Commonly known as: XANAX Take 1 mg by mouth 3 (three) times daily as needed for anxiety.   aspirin EC 81 MG tablet Take 81 mg by mouth daily.   atorvastatin 10 MG tablet Commonly known as: LIPITOR Take 10 mg by mouth at bedtime.   clopidogrel 75 MG tablet Commonly known as: Plavix Take 1 tablet (75 mg total) by mouth daily.   [facility] Fish Oil 1000 MG Caps Take 1,000 mg by mouth every evening.   cyanocobalamin 1000 MCG tablet Commonly known as: VITAMIN B12 Take 1,000 mcg by mouth daily.   diltiazem 60 MG 12 hr capsule Commonly known as: CARDIZEM SR Take 60 mg by mouth 2 (two) times daily.   esomeprazole 40 MG  capsule Commonly known as: NEXIUM Take 40 mg by mouth daily as needed (for heartburn).   folic acid 1 MG tablet Commonly known as: FOLVITE Take 1 tablet (1 mg total) by mouth daily.   furosemide 40 MG tablet Commonly known as: Lasix Take 1 tablet (40 mg total) by mouth daily. Start taking on: May 16, 2022   guaiFENesin 600 MG 12 hr tablet Commonly known as: MUCINEX Take 600 mg by mouth 2 (two) times daily.   ipratropium-albuterol 0.5-2.5 (3) MG/3ML Soln Commonly known as: DUONEB Take 3 mLs by nebulization 2 (two) times daily.   lidocaine-prilocaine cream Commonly known as: EMLA Apply 1 Application topically as needed.   metoprolol succinate 25 MG 24 hr tablet Commonly known as: Toprol XL Take 1 tablet (25 mg total) by mouth daily.   oxyCODONE-acetaminophen 10-325 MG tablet Commonly known as: PERCOCET Take 1 tablet by mouth 5 (five) times daily as needed for pain.   potassium chloride 10 MEQ tablet Commonly known as: KLOR-CON Take 1 tablet (10 mEq total) by mouth daily. Start taking on: May 16, 2022   pregabalin 75 MG capsule Commonly known as: LYRICA Take 75 mg by mouth 2 (two) times daily.   SUPER B COMPLEX PO Take 1 tablet by mouth daily.   thiamine 100 MG tablet Commonly known as: Vitamin B-1 Take 1 tablet (100 mg total) by mouth daily.   vitamin C 1000 MG tablet Take 1,000 mg by mouth daily.               Durable Medical Equipment  (From admission, onward)           Start     Ordered   05/14/22 0000  For home use only DME oxygen       Question Answer Comment  Length of Need 6 Months   Mode or (Route) Nasal cannula   Liters per Minute  2   Frequency Continuous (stationary and portable oxygen unit needed)   Oxygen conserving device Yes   Oxygen delivery system Gas      05/14/22 0901             Follow-up Information     [name], MD. Schedule an appointment as soon as possible for a visit in 1 week(s).    Specialty: Family Medicine Why: Along with your cardiologist within a week Contact information: [address] [place] [address] [phone]                 Major procedures and Radiology Reports - PLEASE review detailed and final reports thoroughly  -      US RENAL  Result Date: 05/13/2022 CLINICAL DATA:  Acute kidney injury EXAM: RENAL / URINARY TRACT ULTRASOUND COMPLETE COMPARISON:  PET CT 03/12/2022 FINDINGS: Right Kidney: Renal measurements: 10.6 x 5.1 x 4.1 cm = volume: 116 mL. Echogenicity within normal limits. No mass or hydronephrosis visualized. Left Kidney: Renal measurements: 11.5 x 6.1 x 4.2 cm = volume: 155 2.9 cm simple cyst mL. Echogenicity within normal limits. No mass or hydronephrosis visualized. Is noted within the lower pole, best characterized as a Bosniak class 1 cyst. No follow-up imaging is recommended for this lesion. Bladder: Appears normal for degree of bladder distention. Other: Multiple solid nodules are seen scattered throughout the visualized liver measuring up to 2.8 cm, concerning for development of multiple hepatic metastases since prior PET CT examination. Small right pleural effusion noted. IMPRESSION: 1. Normal renal sonogram. 2. Multiple solid nodules scattered throughout the visualized liver, concerning for development of multiple hepatic metastases since prior PET CT examination. 3. Small right pleural effusion. Electronically Signed   By: Fidela Salisbury M.D.   On: 05/13/2022 21:16   DG Chest Port 1 View  Result Date: 05/12/2022 CLINICAL DATA:  66 year old male with hypoxia. History of lung cancer, left pneumonectomy. EXAM: PORTABLE CHEST 1 VIEW COMPARISON:  Chest CTA 05/10/2022 and earlier. FINDINGS: Portable AP semi upright view at 0631 hours. Coarse reticulonodular opacity in the right lung does appear progressed since last month and corresponds to abnormal lung opacity on the CTA 2 days ago. Layering small to moderate  right pleural effusion at that time better demonstrated on CT. Surgically absent left lung with stable leftward shift of the mediastinum. Calcified aortic atherosclerosis. Stable visualized osseous structures. IMPRESSION: 1. Coarse reticulonodular opacity in the right lung corresponding to abnormal opacity on CTA 2 days ago more suspicious for acute infection than pulmonary edema. Right pleural effusion better demonstrated on CT. 2. Left pneumonectomy. Electronically Signed   By: Genevie Ann M.D.   On: 05/12/2022 06:56   CT Angio Chest PE W and/or Wo Contrast  Result Date: 05/10/2022 CLINICAL DATA:  Pulmonary embolism suspected, high probability. Increasing shortness of breath. History of lung cancer and currently being treated. Left lung removed. EXAM: CT ANGIOGRAPHY CHEST WITH CONTRAST TECHNIQUE: Multidetector CT imaging of the chest was performed using the standard protocol during bolus administration of intravenous contrast. Multiplanar CT image reconstructions and MIPs were obtained to evaluate the vascular anatomy. RADIATION DOSE REDUCTION: This exam was performed according to the departmental dose-optimization program which includes automated exposure control, adjustment of the mA and/or kV according to patient size and/or use of iterative reconstruction technique. CONTRAST:  42mL OMNIPAQUE IOHEXOL 350 MG/ML SOLN COMPARISON:  04/11/2022. FINDINGS: Cardiovascular: Heart is enlarged and there is a small pericardial effusion. Multi-vessel coronary artery calcifications are noted.  There is atherosclerotic calcification of the aorta without evidence aneurysm. Pulmonary trunk is distended suggesting underlying pulmonary artery hypertension. No evidence of pulmonary embolism. Evaluation of the subsegmental arteries is limited. Mediastinum/Nodes: A conglomeration of enlarged lymph nodes are present in the mediastinum and right hilum, measuring up to 0 4.6 x 2.5 cm in the precarinal space versus 2.8 x 4.2 cm on the  previous exam. No axillary lymphadenopathy. The thyroid gland, trachea, and esophagus are within normal limits. Lungs/Pleura: Status post left pneumonectomy with chronic fibrothorax. There is a small right pleural effusion. Hazy attenuation is noted in the right lung. Multiple pulmonary nodules are seen in the right lung. An azygous lobe is noted. Patchy infiltrates are noted in the posterior aspect of the right upper lobe and right middle lobe. No pneumothorax. Upper Abdomen: Multiple ill-defined hypodensities are present in the liver measuring up to 2.4 cm. There is a left adrenal nodule measuring 2.1 cm, previously characterized as an adenoma. There is nodular thickening of the right adrenal gland. Scattered nodules are noted in the retroperitoneum bilaterally. Musculoskeletal: Scattered lucencies are noted in the bones, compatible with known metastatic disease and increased in size and number from the previous exam. There is a stable compression deformity in the superior endplate at T9. Stable postsurgical changes in the left ribs. Stable fracture in the T9 rib on the left. Stable old healed rib fractures on the right. Review of the MIP images confirms the above findings. IMPRESSION: 1. No evidence of pulmonary embolism. 2. Hazy attenuation in the right lung with patchy infiltrates in the right upper and middle lobes. 3. Small right pleural effusion. 4. Multiple pulmonary nodules in the right lung with mediastinal and right hilar lymphadenopathy, possible metastasis. 5. Multiple hepatic lesions and scattered nodules in the retroperitoneum bilaterally, concerning for metastatic disease. 6. Stable postsurgical changes of left pneumonectomy. 7. Increasing lucencies in the bones, compatible with known metastatic disease. Stable rib fracture at T9 on the left. 8. Aortic atherosclerosis. Electronically Signed   By: Brett Fairy M.D.   On: 05/10/2022 20:24   DG Chest 2 View  Result Date: 05/10/2022 CLINICAL DATA:   Shortness of breath, lung carcinoma EXAM: CHEST - 2 VIEW COMPARISON:  Previous studies including the examination of 04/28/2022 FINDINGS: There is previous left pneumonectomy. There is a interval increase in interstitial markings throughout the right lung. There is 1.5 cm nodular density in the right apical region with no significant change. Azygous fissure is seen in right upper lung field. There is minimal blunting of right lateral CP angle. IMPRESSION: There is significant interval increase in interstitial markings in right lung suggesting interstitial pneumonia or interstitial edema. Status post left pneumonectomy. Small right pleural effusion. There is 1.5 cm nodule in right apical region with no significant change. Electronically Signed   By: Elmer Picker M.D.   On: 05/10/2022 15:48   MR BRAIN W WO CONTRAST  Result Date: 05/05/2022 CLINICAL DATA:  Non-small cell lung cancer.  Staging. EXAM: MRI HEAD WITHOUT AND WITH CONTRAST TECHNIQUE: Multiplanar, multiecho pulse sequences of the brain and surrounding structures were obtained without and with intravenous contrast. CONTRAST:  56mL GADAVIST GADOBUTROL 1 MMOL/ML IV SOLN COMPARISON:  02/28/2008 FINDINGS: Brain: The brainstem is normal. Single punctate focus of restricted diffusion in the posterior right cerebellum favored to represent an acute/subacute cerebellar infarction. There are a few old small vessel cerebellar infarctions but no evidence of enhancing lesion to indicate posterior fossa metastatic disease. Within the cerebral hemispheres, there is a  constellation of findings that I think indicate subacute infarctions within both hemispheres, particularly in the occipital lobes but with other scattered punctate foci in the frontal and parietal regions left more than right. It is not possible to exclude the coexistence of small metastatic lesions at this time, and I would recommend repeat imaging in 6 weeks at which time the smaller lesions should  have lost their enhancement if they represent punctate embolic infarctions. It is certainly possible that both processes could be present in this case. There is some petechial blood product deposition within the larger occipital infarctions. None of the lesions are associated with vasogenic edema or mass effect. No hydrocephalus or extra-axial collection. There is an incidental left frontal convexity meningioma measuring 17 x 17 x 12 mm without any mass-effect upon the brain. Vascular: Major vessels at the base of the brain show flow. Skull and upper cervical spine: Otherwise negative Sinuses/Orbits: Clear/normal Other: None IMPRESSION: 1. Numerous punctate foci of abnormal enhancement in the cerebral hemispheres, particularly in the occipital lobes. Larger occipital lobe lesions that are fairly convincingly secondary to subacute infarction. There is some petechial blood product deposition within the larger occipital infarctions. It is not possible to exclude the coexistence of small metastatic lesions at this time, and I would recommend repeat imaging in 6 weeks at which time the smaller lesions should have lost their enhancement if they represent punctate embolic infarctions. It is certainly possible that both processes could be present in this case, thus the need for short-term follow-up. In the meantime, one might consider neurological evaluation for potential etiology of embolic infarctions particularly in the posterior circulation distribution. 2. Incidental 17 x 17 x 12 mm left frontal convexity meningioma without mass effect or edema. Electronically Signed   By: Nelson Chimes M.D.   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 Recent hospitalization for pneumonia. Known effusion. Increased shortness of breath today. EXAM: CHEST - 2 VIEW COMPARISON:  CTA chest 04/11/2022, AP chest 04/17/2022 and  04/13/2022 FINDINGS: Postsurgical changes are again seen of left pneumonectomy. There is again left-sided volume loss and diffuse homogeneous opacification of the left hemithorax. Leftward mediastinal shift. Moderate calcifications are again seen within aortic arch. The cardiac silhouette is not well evaluated. Slight interval increase in right lower lung interstitial thickening compared to 11/11 2023. The aeration in this region is improved from 04/13/2022 frontal radiograph, however. Normal variant right azygous fissure. Multiple right apical lung nodules are better seen on prior CT. No large right pleural effusion is seen. No pneumothorax is seen. Moderate multilevel degenerative disc changes of the thoracic spine. IMPRESSION: 1. Slight interval increase in right lower lung interstitial thickening compared to 11/11 2023. Nevertheless, the aeration in this region is improved from 04/13/2022 frontal radiograph. 2. Postsurgical changes of left pneumonectomy with leftward mediastinal shift. 3. Please note that multiple right apical lung nodules described on prior CT 04/11/2022 are better visualized on CT. Electronically Signed   By: Yvonne Kendall M.D.   On: 04/28/2022 12:04   DG CHEST PORT 1 VIEW  Result Date: 04/17/2022 CLINICAL DATA:  Pneumonia.  Follow-up study. EXAM: PORTABLE CHEST 1 VIEW COMPARISON:  04/13/2022.  CT, 04/11/2022. FINDINGS: Left hemithorax remains opacified with volume loss reflected by medial shift to the left. Interstitial and hazy airspace opacities in the right lung have improved. Right lung remains hyperexpanded. No new areas of right lung opacity.  No right pleural effusion.  No pneumothorax. IMPRESSION: 1. Interval improvement in right lung aeration with a decrease in interstitial and hazy airspace lung opacities. This is consistent with either improved edema or improved infection. 2. Persistent opacification of the left hemithorax: Patient is status post left pneumonectomy.  Electronically Signed   By: Lajean Manes M.D.   On: 04/17/2022 10:25    Micro Results    Recent Results (from the past 240 hour(s))  MRSA Next Gen by PCR, Nasal     Status: None   Collection Time: 05/10/22 10:05 PM   Specimen: Nasal Mucosa; Nasal Swab  Result Value Ref Range Status   MRSA by PCR Next Gen NOT DETECTED NOT DETECTED Final    Comment: (NOTE) The GeneXpert MRSA Assay (FDA approved for NASAL specimens only), is one component of a comprehensive MRSA colonization surveillance program. It is not intended to diagnose MRSA infection nor to guide or monitor treatment for MRSA infections. Test performance is not FDA approved in patients less than 42 years old. Performed at [facility], [place] [address], [place], [place] [address]     Today   Subjective    Benjamin Case today has no headache,no chest abdominal pain,no new weakness tingling or numbness, feels much better wants to go home today.    Objective   Blood pressure 115/64, pulse 88, temperature 98.1 F (36.7 C), temperature source Oral, resp. rate 19, SpO2 97 %.   Intake/Output Summary (Last 24 hours) at 05/14/2022 0903 Last data filed at 05/14/2022 0700 Gross per 24 hour  Intake 952.32 ml  Output 1025 ml  Net -72.68 ml    Exam  Awake Alert, No new F.N deficits,    .AT,PERRAL Supple Neck,   Symmetrical Chest wall movement, Good air movement bilaterally, few rales RRR,No Gallops,   +ve B.Sounds, Abd Soft, Non tender,  No Cyanosis, Clubbing or edema    Data Review   Recent Labs  Lab 05/10/22 1830 05/11/22 0552 05/12/22 0206 05/12/22 1002 05/13/22 0253 05/14/22 0231  WBC 50.5* 44.5* 27.5*  --  15.5* 17.5*  HGB 9.8* 9.6* 8.1* 9.4* 8.6* 8.7*  HCT 27.9* 27.5* 24.0* 27.0* 24.9* 25.0*  PLT 262 247 212  --  214 210  MCV 98.6 99.6 100.0  --  96.5 97.3  MCH 34.6* 34.8* 33.8  --  33.3 33.9  MCHC 35.1 34.9 33.8  --  34.5 34.8  RDW 13.2 13.1 13.1  --  13.0 12.9  LYMPHSABS 1.3 0.7 1.0  --  1.1  3.0  MONOABS 0.3 0.5 1.1*  --  1.1* 1.4*  EOSABS 0.1 0.3 0.3  --  0.5 0.4  BASOSABS 0.1 0.1 0.1  --  0.0 0.0    Recent Labs  Lab 05/10/22 1830 05/11/22 0340 05/11/22 0552 05/11/22 0557 05/12/22 0206 05/13/22 0253 05/14/22 0231  NA 130* 132*  --   --  131* 129* 130*  K 4.0 4.1  --   --  4.4 4.2 3.8  CL 96* 96*  --   --  92* 90* 90*  CO2 24 27  --   --  28 30 30   GLUCOSE 136* 146*  --   --  138* 136* 105*  BUN 19 19  --   --  22 21 16   CREATININE 1.18 1.16  --   --  1.35* 1.64* 1.30*  AST 26 25  --   --  21 19 25   ALT 27 25  --   --  20 16 22   ALKPHOS 96 103  --   --  99 97 102  BILITOT 0.6 0.4  --   --  0.5 0.4 0.5  ALBUMIN 2.8* 2.7*  --   --  2.5* 2.5* 2.5*  CRP  --   --  8.6*  --   --   --   --   PROCALCITON  --   --  0.82  --   --   --   --   BNP 300.4*  --   --  314.6* 329.7* 528.8* 386.3*  MG 1.0* 2.7*  --   --  1.7 1.4* 2.0  CALCIUM 7.7* 8.0*  --   --  8.8* 8.6* 8.6*     Total Time in preparing paper work, data evaluation and todays exam - 35 minutes  Signature  -    Lala Lund M.D on 05/14/2022 at 9:03 AM   -  To page go to [email or web address]

## 2022-05-14 NOTE — Progress Notes (Signed)
Occupational Therapy Treatment Patient Details Name: Benjamin Case MRN: [id number] DOB: 1955/12/17 Today's Date: 05/14/2022   History of present illness Pt is a 66 y.o. M who presents 05/10/2022 with acute on chronic hypoxic respiratory failure in a patient with metastatic lung CA. Recent admission for PNA underlying COPD and ongoing smoking. Significant PMH: stage IV metastatic lung CA, paroxysmal A-fib, previously on Eliquis, stopped taking DOAC on 05/02/22 after GIB, CVAs, chronic hypoxia on 2L O2 continuously.   OT comments  Pt eager to go home today. Educated in energy conservation strategies and pursed lip breathing. Recommended seated showering with 02 donned. Pt asking for humidified 02 at home, case management notified.    Recommendations for follow up therapy are one component of a multi-disciplinary discharge planning process, led by the attending physician.  Recommendations may be updated based on patient status, additional functional criteria and insurance authorization.    Follow Up Recommendations  No OT follow up     Assistance Recommended at Discharge PRN  Patient can return home with the following  Assistance with cooking/housework;Assist for transportation   Equipment Recommendations  None recommended by OT    Recommendations for Other Services      Precautions / Restrictions Precautions Precautions: Other (comment) Precaution Comments: watch O2 and HR Restrictions Weight Bearing Restrictions: No       Mobility Bed Mobility               General bed mobility comments: in chair    Transfers Overall transfer level: Modified independent Equipment used: None                     Balance Overall balance assessment: Modified Independent                                         ADL either performed or assessed with clinical judgement   ADL Overall ADL's : Modified independent                                        General ADL Comments: Focus of session on educating pt in pursed lip breathing and energy conservation strategies, written handout provided.    Extremity/Trunk Assessment              Vision       Perception     Praxis      Cognition Arousal/Alertness: Awake/alert Behavior During Therapy: WFL for tasks assessed/performed Overall Cognitive Status: Within Functional Limits for tasks assessed                                          Exercises      Shoulder Instructions       General Comments      Pertinent Vitals/ Pain       Pain Assessment Pain Assessment: No/denies pain  Home Living                                          Prior Functioning/Environment              Frequency  Min 2X/week        Progress Toward Goals  OT Goals(current goals can now be found in the care plan section)  Progress towards OT goals: Progressing toward goals  Acute Rehab OT Goals OT Goal Formulation: With patient Time For Goal Achievement: 05/26/22 Potential to Achieve Goals: Good  Plan Discharge plan remains appropriate    Co-evaluation                 AM-PAC OT "6 Clicks" Daily Activity     Outcome Measure   Help from another person eating meals?: None Help from another person taking care of personal grooming?: None Help from another person toileting, which includes using toliet, bedpan, or urinal?: None Help from another person bathing (including washing, rinsing, drying)?: None Help from another person to put on and taking off regular upper body clothing?: None Help from another person to put on and taking off regular lower body clothing?: None 6 Click Score: 24    End of Session Equipment Utilized During Treatment: Oxygen  OT Visit Diagnosis: Other (comment) (decreased activity tolerance)   Activity Tolerance Patient tolerated treatment well   Patient Left in chair;with call bell/phone within reach    Nurse Communication          Time: 9528-4132 OT Time Calculation (min): 24 min  Charges: OT General Charges $OT Visit: 1 Visit OT Treatments $Self Care/Home Management : 23-37 mins  Cleta Alberts, OTR/L Acute Rehabilitation Services Office: [phone]   Malka So 05/14/2022, 11:22 AM

## 2022-05-14 NOTE — Progress Notes (Signed)
Physical Therapy Treatment Patient Details Name: Benjamin Case MRN: [id number] DOB: [birth year] Today's Date: 05/14/2022   History of Present Illness Pt is a 66 y.o. M who presents 05/10/2022 with acute on chronic hypoxic respiratory failure in a patient with metastatic lung CA. Recent admission for PNA underlying COPD and ongoing smoking. Significant PMH: stage IV metastatic lung CA, paroxysmal A-fib, previously on Eliquis, stopped taking DOAC on 05/02/22 after GIB, CVAs, chronic hypoxia on 2L O2 continuously.    PT Comments    Pt eager to d/c home, agreeable to session focused on functional mobility. Pt ambulated hallway distance with x3 standing rest breaks as needed, pt consistently stopping to recover dyspnea when satting 90%. pt had a brief dip to upper 70s on 4LO2 post-gait when sitting and recovering, PT increased him to 6LO2 with recovery to 90% within a minute. Otherwise during gait pt was 88% and above on 4LO2.  Pt with good understanding of O2 requirements and recovery, PT encouraged use of pulse oximeter at home to self-monitor as well.     Recommendations for follow up therapy are one component of a multi-disciplinary discharge planning process, led by the attending physician.  Recommendations may be updated based on patient status, additional functional criteria and insurance authorization.  Follow Up Recommendations  No PT follow up     Assistance Recommended at Discharge PRN  Patient can return home with the following Assistance with cooking/housework;Assist for transportation;Help with stairs or ramp for entrance   Equipment Recommendations  None recommended by PT    Recommendations for Other Services       Precautions / Restrictions Precautions Precautions: Other (comment) Precaution Comments: watch O2 and HR Restrictions Weight Bearing Restrictions: No     Mobility  Bed Mobility Overal bed mobility: Modified Independent                   Transfers Overall transfer level: Modified independent Equipment used: None Transfers: Sit to/from Stand Sit to Stand: Modified independent (Device/Increase time)           General transfer comment: increased time    Ambulation/Gait Ambulation/Gait assistance: Min guard Gait Distance (Feet): 200 Feet Assistive device: None Gait Pattern/deviations: Step-through pattern, Decreased stride length, Trunk flexed Gait velocity: decr     General Gait Details: close guard for safety, pt reaching for environment several times to self-steady. x3 standing rest breaks to recover DOE 2/4, SpO2 90% and greater on 4LO2 during gait. Brief dip to upper70s/80s post-gait requiring PT increasing O2 to 6LO2   Stairs             Wheelchair Mobility    Modified Rankin (Stroke Patients Only)       Balance Overall balance assessment: Needs assistance Sitting-balance support: Feet supported Sitting balance-Leahy Scale: Normal     Standing balance support: No upper extremity supported Standing balance-Leahy Scale: Good                              Cognition Arousal/Alertness: Awake/alert Behavior During Therapy: WFL for tasks assessed/performed Overall Cognitive Status: Within Functional Limits for tasks assessed                                          Exercises Other Exercises Other Exercises: Discussed: pursed lip breathing technique, rest breaks as needed during gait with  self-monitoring cues, frequent mobility at d/c    General Comments        Pertinent Vitals/Pain Pain Assessment Pain Assessment: Faces Faces Pain Scale: No hurt Pain Intervention(s): Monitored during session    Home Living                          Prior Function            PT Goals (current goals can now be found in the care plan section) Acute Rehab PT Goals Patient Stated Goal: to return home when ready PT Goal Formulation: With patient Time For  Goal Achievement: 05/26/22 Potential to Achieve Goals: Good Progress towards PT goals: Progressing toward goals    Frequency    Min 3X/week      PT Plan Current plan remains appropriate    Co-evaluation              AM-PAC PT "6 Clicks" Mobility   Outcome Measure  Help needed turning from your back to your side while in a flat bed without using bedrails?: None Help needed moving from lying on your back to sitting on the side of a flat bed without using bedrails?: None Help needed moving to and from a bed to a chair (including a wheelchair)?: None Help needed standing up from a chair using your arms (e.g., wheelchair or bedside chair)?: None Help needed to walk in hospital room?: A Little Help needed climbing 3-5 steps with a railing? : A Little 6 Click Score: 22    End of Session Equipment Utilized During Treatment: Oxygen Activity Tolerance: Patient tolerated treatment well Patient left: in bed;with call bell/phone within reach Nurse Communication: Mobility status PT Visit Diagnosis: Difficulty in walking, not elsewhere classified (R26.2)     Time: 2094-7096 PT Time Calculation (min) (ACUTE ONLY): 20 min  Charges:  $Therapeutic Activity: 8-22 mins                    Stacie Glaze, PT DPT Acute Rehabilitation Services Pager [phone]  Office [phone]    Roxine Caddy E Ruffin Pyo 05/14/2022, 10:49 AM

## 2022-05-14 NOTE — Discharge Instructions (Signed)
Follow with Primary MD [name], MD in 7 days   Get CBC, CMP, 2 view Chest X ray -  checked next visit with your primary MD   Activity: As tolerated with Full fall precautions use walker/cane & assistance as needed  Disposition Home    Diet: Heart Healthy with strict 1.5 L fluid restriction per day  Check your Weight same time everyday, if you gain over 2 pounds, or you develop in leg swelling, experience more shortness of breath or chest pain, call your Primary MD immediately. Follow Cardiac Low Salt Diet and 1.5 lit/day fluid restriction.  Special Instructions: If you have smoked or chewed Tobacco  in the last 2 yrs please stop smoking, stop any regular Alcohol  and or any Recreational drug use.  On your next visit with your primary care physician please Get Medicines reviewed and adjusted.  Please request your Prim.MD to go over all Hospital Tests and Procedure/Radiological results at the follow up, please get all Hospital records sent to your Prim MD by signing hospital release before you go home.  If you experience worsening of your admission symptoms, develop shortness of breath, life threatening emergency, suicidal or homicidal thoughts you must seek medical attention immediately by calling 911 or calling your MD immediately  if symptoms less severe.  You Must read complete instructions/literature along with all the possible adverse reactions/side effects for all the Medicines you take and that have been prescribed to you. Take any new Medicines after you have completely understood and accpet all the possible adverse reactions/side effects.

## 2022-05-14 NOTE — Progress Notes (Signed)
Discharge instructions reviewed with pt and wife.  Copy of instructions given to pt. Scripts filled by [facility] and delivered to pt's room. Pt taken out on his O2 tank from home.  Questions answered and Dr Candiss Norse was contacted to clarify some of pt's questions, he called into the pt's room and spoke with pt's wife on the phone.  Pt d/c'd via wheelchair with belongings, with wife.           Escorted by staff.

## 2022-05-14 NOTE — Progress Notes (Signed)
Daily Progress Note   Patient Name: Benjamin Case       Date: 05/14/2022 DOB: 04-14-1956  Age: 66 y.o. MRN#: [id number] Attending Physician: Thurnell Lose, MD Primary Care Physician: [name], MD Admit Date: 05/10/2022  Reason for Consultation/Follow-up: Establishing goals of care  Subjective: Medical records reviewed including progress notes, labs, imaging. Patient assessed at the bedside.  He is sitting up comfortably in bedside chair.  No family present during my visit.  Created space and opportunity for patient's thoughts and feelings on his current illness, following up on his discussions with his wife regarding CODE STATUS. He shares that they did review the hard choices for loving people booklet and discussed together. At this time, they would like to see how he does at home and how his disease progresses before making any decisions on DNR.    Outpatient palliative care at the [facility] was explained and offered for assistance with further conversations.  Patient politely declined at this time.  Encouraged him to reach out to cancer center staff or PMT should he change his mind.  Questions and concerns addressed. PMT will continue to support holistically.   Length of Stay: 4  Physical Exam Vitals and nursing note reviewed.  Constitutional:      General: He is not in acute distress.    Interventions: Nasal cannula in place.     Comments: 5L  Cardiovascular:     Rate and Rhythm: Tachycardia present.  Pulmonary:     Effort: Pulmonary effort is normal.  Neurological:     Mental Status: He is alert and oriented to person, place, and time.  Psychiatric:        Mood and Affect: Mood normal.        Behavior: Behavior normal.            Vital Signs: BP  115/64   Pulse 88   Temp 98.1 F (36.7 C) (Oral)   Resp 19   SpO2 97%  SpO2: SpO2: 97 % O2 Device: O2 Device: Nasal Cannula O2 Flow Rate: O2 Flow Rate (L/min): 4 L/min      Palliative Assessment/Data: 50%   Palliative Care Assessment & Plan   Patient Profile: 66 y.o. male  with past medical history of  Stage IV metastatic lung cancer, paroxysmal A-fib  previously on Eliquis, stopped taking the [place] on 05/02/22 after GI bleed, CVAs, chronic hypoxia on 2 L nasal cannula continuously  admitted on 05/10/2022 with dyspnea, worsening for several days.    Patient readmitted within 30 days of hospitalization for pneumonia, now for likely residual pneumonia, acute CHF exacerbation, acute on chronic hypoxic respiratory failure with underlying COPD and metastatic lung cancer.  PMT has been consulted to assist with goals of care conversation.  Assessment: Goals of care conversation Metastatic lung cancer Pneumonia COPD History of CVAs A-fib  Recommendations/Plan: Continue full code/full scope treatment Patient declines outpatient palliative care follow-up PMT remains available as needed  Prognosis: Guarded  Discharge Planning: Home  Care plan was discussed with patient   MDM high         Adalynd Donahoe Johnnette Litter, PA-C  Palliative Medicine Team Team phone # [phone]  Thank you for allowing the Palliative Medicine Team to assist in the care of this patient. Please utilize secure chat with additional questions, if there is no response within 30 minutes please call the above phone number.  Palliative Medicine Team providers are available by phone from 7am to 7pm daily and can be reached through the team cell phone.  Should this patient require assistance outside of these hours, please call the patient's attending physician.

## 2022-05-19 ENCOUNTER — Other Ambulatory Visit: Payer: Self-pay

## 2022-05-19 ENCOUNTER — Encounter (HOSPITAL_COMMUNITY): Payer: Self-pay | Admitting: Family Medicine

## 2022-05-19 ENCOUNTER — Telehealth: Payer: Self-pay

## 2022-05-19 DIAGNOSIS — J90 Pleural effusion, not elsewhere classified: Secondary | ICD-10-CM

## 2022-05-19 DIAGNOSIS — R0602 Shortness of breath: Secondary | ICD-10-CM

## 2022-05-19 NOTE — Telephone Encounter (Signed)
This nurse returned call related to patient being discharged from the hospital with a recommendation to have a 2-view chest xray completed by his primary care.  Patient's wife asked if the xray could be completed on 12/14 we he comes in for labs.  This nurse spoke with provider who gave the order for the xray.  Advised to arrive at the registration desk at the main entrance of [facility] and they will complete the xray.  She acknowledged understanding.  No further questions or concerns noted at this time.

## 2022-05-20 ENCOUNTER — Other Ambulatory Visit: Payer: Self-pay

## 2022-05-20 ENCOUNTER — Ambulatory Visit (HOSPITAL_COMMUNITY)
Admission: RE | Admit: 2022-05-20 | Discharge: 2022-05-20 | Disposition: A | Discharge status: Home / Self Care | Payer: Medicare Other | Source: Ambulatory Visit | Attending: Internal Medicine | Admitting: Internal Medicine

## 2022-05-20 ENCOUNTER — Other Ambulatory Visit: Payer: Self-pay | Admitting: Medical Oncology

## 2022-05-20 ENCOUNTER — Ambulatory Visit: Payer: Medicare Other

## 2022-05-20 ENCOUNTER — Ambulatory Visit: Payer: Medicare Other | Admitting: Internal Medicine

## 2022-05-20 ENCOUNTER — Other Ambulatory Visit: Payer: Medicare Other

## 2022-05-20 ENCOUNTER — Inpatient Hospital Stay: Payer: Medicare Other

## 2022-05-20 DIAGNOSIS — C3411 Malignant neoplasm of upper lobe, right bronchus or lung: Secondary | ICD-10-CM

## 2022-05-20 DIAGNOSIS — J90 Pleural effusion, not elsewhere classified: Secondary | ICD-10-CM | POA: Insufficient documentation

## 2022-05-20 DIAGNOSIS — R0602 Shortness of breath: Secondary | ICD-10-CM | POA: Insufficient documentation

## 2022-05-20 LAB — CMP (CANCER CENTER ONLY)
ALT: 17 U/L (ref 0–44)
AST: 23 U/L (ref 15–41)
Albumin: 3.7 g/dL (ref 3.5–5.0)
Alkaline Phosphatase: 139 U/L — ABNORMAL HIGH (ref 38–126)
Anion gap: 10 (ref 5–15)
BUN: 18 mg/dL (ref 8–23)
CO2: 38 mmol/L — ABNORMAL HIGH (ref 22–32)
Calcium: 8.5 mg/dL — ABNORMAL LOW (ref 8.9–10.3)
Chloride: 88 mmol/L — ABNORMAL LOW (ref 98–111)
Creatinine: 1.29 mg/dL — ABNORMAL HIGH (ref 0.61–1.24)
GFR, Estimated: >60 mL/min (ref >60)
Glucose, Bld: 126 mg/dL — ABNORMAL HIGH (ref 70–99)
Potassium: 4.4 mmol/L (ref 3.5–5.1)
Sodium: 136 mmol/L (ref 135–145)
Total Bilirubin: 0.3 mg/dL (ref 0.3–1.2)
Total Protein: 6.6 g/dL (ref 6.5–8.1)

## 2022-05-20 LAB — CBC WITH DIFFERENTIAL (CANCER CENTER ONLY)
Abs Immature Granulocytes: 1.11 10*3/uL — ABNORMAL HIGH (ref 0.00–0.07)
Basophils Absolute: 0.2 10*3/uL — ABNORMAL HIGH (ref 0.0–0.1)
Basophils Relative: 1 %
Eosinophils Absolute: 0.2 10*3/uL (ref 0.0–0.5)
Eosinophils Relative: 1 %
HCT: 33.2 % — ABNORMAL LOW (ref 39.0–52.0)
Hemoglobin: 11.1 g/dL — ABNORMAL LOW (ref 13.0–17.0)
Immature Granulocytes: 4 %
Lymphocytes Relative: 8 %
Lymphs Abs: 2.6 10*3/uL (ref 0.7–4.0)
MCH: 33.8 pg (ref 26.0–34.0)
MCHC: 33.4 g/dL (ref 30.0–36.0)
MCV: 101.2 fL — ABNORMAL HIGH (ref 80.0–100.0)
Monocytes Absolute: 1.7 10*3/uL — ABNORMAL HIGH (ref 0.1–1.0)
Monocytes Relative: 5 %
Neutro Abs: 26.1 10*3/uL — ABNORMAL HIGH (ref 1.7–7.7)
Neutrophils Relative %: 81 %
Platelet Count: 283 10*3/uL (ref 150–400)
RBC: 3.28 MIL/uL — ABNORMAL LOW (ref 4.22–5.81)
RDW: 13.6 % (ref 11.5–15.5)
WBC Count: 31.8 10*3/uL — ABNORMAL HIGH (ref 4.0–10.5)
nRBC: 0 % (ref 0.0–0.2)

## 2022-05-22 ENCOUNTER — Ambulatory Visit: Payer: Medicare Other

## 2022-05-26 MED FILL — Dexamethasone Sodium Phosphate Inj 100 MG/10ML: INTRAMUSCULAR | Qty: 1 | Status: AC

## 2022-05-26 MED FILL — Fosaprepitant Dimeglumine For IV Infusion 150 MG (Base Eq): INTRAVENOUS | Qty: 5 | Status: AC

## 2022-05-26 NOTE — Progress Notes (Unsigned)
[facility] OFFICE PROGRESS NOTE  Benjamin Jordan, MD [address] [place] [address]  DIAGNOSIS: Stage IV (T1a, N2, M1 C) non-small cell lung cancer, squamous cell carcinoma presented with right upper lobe lung nodule in addition to right hilar and mediastinal lymphadenopathy and metastatic disease to several bone areas as well as musculoskeletal muscles diagnosed in October 2023. The patient also has a history of a stage IIIa non-small cell lung cancer, squamous cell carcinoma diagnosed in June 2009    Detected Alteration(s) / Biomarker(s)         Associated FDA-approved therapies Clinical Trial Availability          % cfDNA or Amplification KRAS G12C approved by FDA Adagrasib, Sotorasib Yes    6.8%   TP53 S241C None Yes          0.9%   TP53 Q136E None Yes          0.5%  PRIOR THERAPY: 1) status post left pneumonectomy in 2009 2) 4 cycles of adjuvant chemotherapy completed in April 29, 2008   CURRENT THERAPY: Palliative systemic chemotherapy with carboplatin for an AUC of 5, paclitaxel 175 mg per metered square, and converted to 100 mg IV every 3 weeks with Neulasta support.  Status post 2 cycles  First dose of treatment on 04/08/22    INTERVAL HISTORY: Benjamin Case 66 y.o. male returns to the clinic today for follow-up visit accompanied by his wife.  The patient was last seen on 05/05/2022.  The patient has a complicated medical history.  The patient was recently hospitalized from 12/4 to 12/8 for hypoxic respiratory failure secondary to pneumonia, COPD exacerbation, and some element of congestive heart failure.  He is feeling***at this time.  The patient is followed closely by cardiology for his atrial fibrillation/atrial flutter.  The patient is not able to take Eliquis secondary to GI bleeding.  He is currently taking Plavix and denies any known abnormal bleeding bruising epistaxis, gingival bleeding, hemoptysis, hematemesis, melena, or  hematochezia.  Sees cardiology on 1/9.  He also follows closely with [name] from neuro-oncology for possible embolic strokes.  He has a follow-up brain MRI in January 2024.  Regarding his chemotherapy for his stage IV lung cancer, the patient thus far has been tolerating that fairly well.  Today he denies any fevers, chills, night sweats, or weight loss.  Breathing?  The patient has on ***years of supplemental oxygen at baseline.  Cough?  Denies any nausea, vomiting, diarrhea, or constipation.  Denies any headache or visual changes.  Denies any rashes or skin changes.  He is here today for evaluation and repeat blood work before undergoing cycle #3.  MEDICAL HISTORY: Past Medical History:  Diagnosis Date   Arthritis    back   COPD (chronic obstructive pulmonary disease) (HCC)    Diabetes mellitus without complication (HCC)    no medications , monitors cbg at home with monitor, avg no more than 150s at home    Dyspnea    increased exertion; pt states can climb flight of stairs w/o having to stop prior to reaching top ; ongoing reports it is due to "having one lung"    GERD (gastroesophageal reflux disease)    Hepatitis    hepatitis C; pt states competed treatments, unsure what medication he was treated with    History of chemotherapy    Hypertension    reports its been over a year since he stopped taking bp medication, reports  he made her medical doctor aware that he was topping it    lung ca dx'd 12/2007   lt lobectomy   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   and back - see note in [facility] 12/26/21   Numbness    FEET BILATERAL   Pneumonia 2009   Right upper lobe pulmonary nodule 04/29/2021   Rotator cuff tear    right     ALLERGIES:  has No Known Allergies.  MEDICATIONS:  Current Outpatient Medications  Medication Sig Dispense Refill   acetaminophen (TYLENOL) 650 MG CR tablet Take 1,300 mg by mouth every 8 (eight) hours as needed for pain.     ALPRAZolam (XANAX) 1 MG tablet Take 1 mg  by mouth 3 (three) times daily as needed for anxiety.  5   Ascorbic Acid (VITAMIN C) 1000 MG tablet Take 1,000 mg by mouth daily.     aspirin EC 81 MG tablet Take 81 mg by mouth daily.     atorvastatin (LIPITOR) 10 MG tablet Take 10 mg by mouth at bedtime.      B Complex-C (SUPER B COMPLEX PO) Take 1 tablet by mouth daily.     clopidogrel (PLAVIX) 75 MG tablet Take 1 tablet (75 mg total) by mouth daily. 30 tablet 1   diltiazem (CARDIZEM SR) 60 MG 12 hr capsule Take 60 mg by mouth 2 (two) times daily.     esomeprazole (NEXIUM) 40 MG capsule Take 40 mg by mouth daily as needed (for heartburn).      folic acid (FOLVITE) 1 MG tablet Take 1 tablet (1 mg total) by mouth daily. 30 tablet 1   furosemide (LASIX) 40 MG tablet Take 1 tablet (40 mg total) by mouth daily. 30 tablet 0   guaiFENesin (MUCINEX) 600 MG 12 hr tablet Take 600 mg by mouth 2 (two) times daily.     ipratropium-albuterol (DUONEB) 0.5-2.5 (3) MG/3ML SOLN Take 3 mLs by nebulization 2 (two) times daily.     lidocaine-prilocaine (EMLA) cream Apply 1 Application topically as needed. (Patient not taking: Reported on 05/10/2022) 30 g 2   metoprolol succinate (TOPROL XL) 25 MG 24 hr tablet Take 1 tablet (25 mg total) by mouth daily. 90 tablet 1   Omega-3 Fatty Acids ([employer] FISH OIL) 1000 MG CAPS Take 1,000 mg by mouth every evening.     oxyCODONE-acetaminophen (PERCOCET) 10-325 MG tablet Take 1 tablet by mouth 5 (five) times daily as needed for pain.     potassium chloride (KLOR-CON M) 10 MEQ tablet Take 1 tablet (10 mEq total) by mouth daily. 30 tablet 0   pregabalin (LYRICA) 75 MG capsule Take 75 mg by mouth 2 (two) times daily.     thiamine (VITAMIN B-1) 100 MG tablet Take 1 tablet (100 mg total) by mouth daily. 30 tablet 1   vitamin B-12 (CYANOCOBALAMIN) 1000 MCG tablet Take 1,000 mcg by mouth daily.     No current facility-administered medications for this visit.    SURGICAL HISTORY:  Past Surgical History:  Procedure Laterality Date    ARTHROSCOPIC REPAIR ACL Right    BRONCHIAL BIOPSY  06/09/2021   Procedure: BRONCHIAL BIOPSIES;  Surgeon: Garner Nash, DO;  Location: [facility];  Service: Pulmonary;;   BRONCHIAL BRUSHINGS  06/09/2021   Procedure: BRONCHIAL BRUSHINGS;  Surgeon: Garner Nash, DO;  Location: [facility];  Service: Pulmonary;;   BRONCHIAL WASHINGS  06/09/2021   Procedure: BRONCHIAL WASHINGS;  Surgeon: Garner Nash, DO;  Location: [facility];  Service: Pulmonary;;  CATARACT EXTRACTION, BILATERAL  2019   DECOMPRESSIVE LUMBAR LAMINECTOMY LEVEL 1 N/A 03/24/2016   Procedure: L3-4 CENTRAL DECOMPRESSION LUMBER LAMINECTOMY;  Surgeon: Latanya Maudlin, MD;  Location: [facility];  Service: Orthopedics;  Laterality: N/A;   FINE NEEDLE ASPIRATION  03/23/2022   Procedure: FINE NEEDLE ASPIRATION (FNA) LINEAR;  Surgeon: Garner Nash, DO;  Location: [facility];  Service: Pulmonary;;   FOOT SURGERY Bilateral    HAND SURGERY Bilateral    LOBECTOMY Left 2009   pneumonectomy Left 2009   [name]   SHOULDER ARTHROSCOPY WITH DISTAL CLAVICLE RESECTION Right 12/14/2018   Procedure: SHOULDER ARTHROSCOPY WITH DISTAL CLAVICLE RESECTION;  Surgeon: Tania Ade, MD;  Location: [facility];  Service: Orthopedics;  Laterality: Right;   SHOULDER ARTHROSCOPY WITH ROTATOR CUFF REPAIR Right 12/14/2018   Procedure: SHOULDER ARTHROSCOPY WITH ROTATOR CUFF REPAIR;  Surgeon: Tania Ade, MD;  Location: [facility];  Service: Orthopedics;  Laterality: Right;   SHOULDER ARTHROSCOPY WITH SUBACROMIAL DECOMPRESSION Right 12/14/2018   Procedure: SHOULDER ARTHROSCOPY WITH SUBACROMIAL DECOMPRESSION;  Surgeon: Tania Ade, MD;  Location: [facility];  Service: Orthopedics;  Laterality: Right;   TRIGGER FINGER RELEASE     right hand, pointer finger    VIDEO BRONCHOSCOPY WITH ENDOBRONCHIAL ULTRASOUND Right 03/23/2022   Procedure: VIDEO BRONCHOSCOPY WITH ENDOBRONCHIAL ULTRASOUND;  Surgeon: Garner Nash, DO;  Location: [facility];   Service: Pulmonary;  Laterality: Right;   VIDEO BRONCHOSCOPY WITH RADIAL ENDOBRONCHIAL ULTRASOUND  06/09/2021   Procedure: VIDEO BRONCHOSCOPY WITH RADIAL ENDOBRONCHIAL ULTRASOUND;  Surgeon: Garner Nash, DO;  Location: [facility];  Service: Pulmonary;;    REVIEW OF SYSTEMS:   Review of Systems  Constitutional: Negative for appetite change, chills, fatigue, fever and unexpected weight change.  HENT:   Negative for mouth sores, nosebleeds, sore throat and trouble swallowing.   Eyes: Negative for eye problems and icterus.  Respiratory: Negative for cough, hemoptysis, shortness of breath and wheezing.   Cardiovascular: Negative for chest pain and leg swelling.  Gastrointestinal: Negative for abdominal pain, constipation, diarrhea, nausea and vomiting.  Genitourinary: Negative for bladder incontinence, difficulty urinating, dysuria, frequency and hematuria.   Musculoskeletal: Negative for back pain, gait problem, neck pain and neck stiffness.  Skin: Negative for itching and rash.  Neurological: Negative for dizziness, extremity weakness, gait problem, headaches, light-headedness and seizures.  Hematological: Negative for adenopathy. Does not bruise/bleed easily.  Psychiatric/Behavioral: Negative for confusion, depression and sleep disturbance. The patient is not nervous/anxious.     PHYSICAL EXAMINATION:  There were no vitals taken for this visit.  ECOG PERFORMANCE STATUS: {CHL ONC ECOG [id number]  Physical Exam  Constitutional: Oriented to person, place, and time and well-developed, well-nourished, and in no distress. No distress.  HENT:  Head: Normocephalic and atraumatic.  Mouth/Throat: Oropharynx is clear and moist. No oropharyngeal exudate.  Eyes: Conjunctivae are normal. Right eye exhibits no discharge. Left eye exhibits no discharge. No scleral icterus.  Neck: Normal range of motion. Neck supple.  Cardiovascular: Normal rate, regular rhythm, normal heart sounds and intact  distal pulses.   Pulmonary/Chest: Effort normal and breath sounds normal. No respiratory distress. No wheezes. No rales.  Abdominal: Soft. Bowel sounds are normal. Exhibits no distension and no mass. There is no tenderness.  Musculoskeletal: Normal range of motion. Exhibits no edema.  Lymphadenopathy:    No cervical adenopathy.  Neurological: Alert and oriented to person, place, and time. Exhibits normal muscle tone. Gait normal. Coordination normal.  Skin: Skin is warm and dry. No rash noted. Not diaphoretic. No  erythema. No pallor.  Psychiatric: Mood, memory and judgment normal.  Vitals reviewed.  LABORATORY DATA: Lab Results  Component Value Date   WBC 31.8 (H) 05/20/2022   HGB 11.1 (L) 05/20/2022   HCT 33.2 (L) 05/20/2022   MCV 101.2 (H) 05/20/2022   PLT 283 05/20/2022      Chemistry      Component Value Date/Time   NA 136 05/20/2022 1031   NA 138 04/26/2016 1205   K 4.4 05/20/2022 1031   K 5.0 04/26/2016 1205   CL 88 (L) 05/20/2022 1031   CL 103 04/27/2012 1253   CO2 38 (H) 05/20/2022 1031   CO2 30 (H) 04/26/2016 1205   BUN 18 05/20/2022 1031   BUN 6.0 (L) 04/26/2016 1205   CREATININE 1.29 (H) 05/20/2022 1031   CREATININE 1.3 04/26/2016 1205      Component Value Date/Time   CALCIUM 8.5 (L) 05/20/2022 1031   CALCIUM 9.8 04/26/2016 1205   ALKPHOS 139 (H) 05/20/2022 1031   ALKPHOS 60 04/26/2016 1205   AST 23 05/20/2022 1031   AST 40 (H) 04/26/2016 1205   ALT 17 05/20/2022 1031   ALT 25 04/26/2016 1205   BILITOT 0.3 05/20/2022 1031   BILITOT 0.47 04/26/2016 1205       RADIOGRAPHIC STUDIES:  DG Chest 2 View  Result Date: 05/21/2022 CLINICAL DATA:  Shortness of breath and pleural effusion. EXAM: CHEST - 2 VIEW COMPARISON:  May 12, 2022 FINDINGS: The heart size and mediastinal contours are stable. Complete opacity of the left hemithorax unchanged. Increased pulmonary interstitium with ground-glass opacities are identified throughout the right lung. Minimal  right pleural effusion is noted. The visualized skeletal structures are stable. IMPRESSION: 1. Complete opacity of the left hemithorax unchanged. 2. Increased pulmonary interstitium with ground-glass opacities throughout the right lung consistent with pulmonary edema. Electronically Signed   By: Abelardo Diesel M.D.   On: 05/21/2022 13:39   US RENAL  Result Date: 05/13/2022 CLINICAL DATA:  Acute kidney injury EXAM: RENAL / URINARY TRACT ULTRASOUND COMPLETE COMPARISON:  PET CT 03/12/2022 FINDINGS: Right Kidney: Renal measurements: 10.6 x 5.1 x 4.1 cm = volume: 116 mL. Echogenicity within normal limits. No mass or hydronephrosis visualized. Left Kidney: Renal measurements: 11.5 x 6.1 x 4.2 cm = volume: 155 2.9 cm simple cyst mL. Echogenicity within normal limits. No mass or hydronephrosis visualized. Is noted within the lower pole, best characterized as a Bosniak class 1 cyst. No follow-up imaging is recommended for this lesion. Bladder: Appears normal for degree of bladder distention. Other: Multiple solid nodules are seen scattered throughout the visualized liver measuring up to 2.8 cm, concerning for development of multiple hepatic metastases since prior PET CT examination. Small right pleural effusion noted. IMPRESSION: 1. Normal renal sonogram. 2. Multiple solid nodules scattered throughout the visualized liver, concerning for development of multiple hepatic metastases since prior PET CT examination. 3. Small right pleural effusion. Electronically Signed   By: Fidela Salisbury M.D.   On: 05/13/2022 21:16   DG Chest Port 1 View  Result Date: 05/12/2022 CLINICAL DATA:  66 year old male with hypoxia. History of lung cancer, left pneumonectomy. EXAM: PORTABLE CHEST 1 VIEW COMPARISON:  Chest CTA 05/10/2022 and earlier. FINDINGS: Portable AP semi upright view at 0631 hours. Coarse reticulonodular opacity in the right lung does appear progressed since last month and corresponds to abnormal lung opacity on the CTA 2  days ago. Layering small to moderate right pleural effusion at that time better demonstrated on CT. Surgically absent  left lung with stable leftward shift of the mediastinum. Calcified aortic atherosclerosis. Stable visualized osseous structures. IMPRESSION: 1. Coarse reticulonodular opacity in the right lung corresponding to abnormal opacity on CTA 2 days ago more suspicious for acute infection than pulmonary edema. Right pleural effusion better demonstrated on CT. 2. Left pneumonectomy. Electronically Signed   By: Genevie Ann M.D.   On: 05/12/2022 06:56   CT Angio Chest PE W and/or Wo Contrast  Result Date: 05/10/2022 CLINICAL DATA:  Pulmonary embolism suspected, high probability. Increasing shortness of breath. History of lung cancer and currently being treated. Left lung removed. EXAM: CT ANGIOGRAPHY CHEST WITH CONTRAST TECHNIQUE: Multidetector CT imaging of the chest was performed using the standard protocol during bolus administration of intravenous contrast. Multiplanar CT image reconstructions and MIPs were obtained to evaluate the vascular anatomy. RADIATION DOSE REDUCTION: This exam was performed according to the departmental dose-optimization program which includes automated exposure control, adjustment of the mA and/or kV according to patient size and/or use of iterative reconstruction technique. CONTRAST:  76m OMNIPAQUE IOHEXOL 350 MG/ML SOLN COMPARISON:  04/11/2022. FINDINGS: Cardiovascular: Heart is enlarged and there is a small pericardial effusion. Multi-vessel coronary artery calcifications are noted. There is atherosclerotic calcification of the aorta without evidence aneurysm. Pulmonary trunk is distended suggesting underlying pulmonary artery hypertension. No evidence of pulmonary embolism. Evaluation of the subsegmental arteries is limited. Mediastinum/Nodes: A conglomeration of enlarged lymph nodes are present in the mediastinum and right hilum, measuring up to 0 4.6 x 2.5 cm in the  precarinal space versus 2.8 x 4.2 cm on the previous exam. No axillary lymphadenopathy. The thyroid gland, trachea, and esophagus are within normal limits. Lungs/Pleura: Status post left pneumonectomy with chronic fibrothorax. There is a small right pleural effusion. Hazy attenuation is noted in the right lung. Multiple pulmonary nodules are seen in the right lung. An azygous lobe is noted. Patchy infiltrates are noted in the posterior aspect of the right upper lobe and right middle lobe. No pneumothorax. Upper Abdomen: Multiple ill-defined hypodensities are present in the liver measuring up to 2.4 cm. There is a left adrenal nodule measuring 2.1 cm, previously characterized as an adenoma. There is nodular thickening of the right adrenal gland. Scattered nodules are noted in the retroperitoneum bilaterally. Musculoskeletal: Scattered lucencies are noted in the bones, compatible with known metastatic disease and increased in size and number from the previous exam. There is a stable compression deformity in the superior endplate at T9. Stable postsurgical changes in the left ribs. Stable fracture in the T9 rib on the left. Stable old healed rib fractures on the right. Review of the MIP images confirms the above findings. IMPRESSION: 1. No evidence of pulmonary embolism. 2. Hazy attenuation in the right lung with patchy infiltrates in the right upper and middle lobes. 3. Small right pleural effusion. 4. Multiple pulmonary nodules in the right lung with mediastinal and right hilar lymphadenopathy, possible metastasis. 5. Multiple hepatic lesions and scattered nodules in the retroperitoneum bilaterally, concerning for metastatic disease. 6. Stable postsurgical changes of left pneumonectomy. 7. Increasing lucencies in the bones, compatible with known metastatic disease. Stable rib fracture at T9 on the left. 8. Aortic atherosclerosis. Electronically Signed   By: LBrett FairyM.D.   On: 05/10/2022 20:24   DG Chest 2  View  Result Date: 05/10/2022 CLINICAL DATA:  Shortness of breath, lung carcinoma EXAM: CHEST - 2 VIEW COMPARISON:  Previous studies including the examination of 04/28/2022 FINDINGS: There is previous left pneumonectomy. There is a  interval increase in interstitial markings throughout the right lung. There is 1.5 cm nodular density in the right apical region with no significant change. Azygous fissure is seen in right upper lung field. There is minimal blunting of right lateral CP angle. IMPRESSION: There is significant interval increase in interstitial markings in right lung suggesting interstitial pneumonia or interstitial edema. Status post left pneumonectomy. Small right pleural effusion. There is 1.5 cm nodule in right apical region with no significant change. Electronically Signed   By: Elmer Picker M.D.   On: 05/10/2022 15:48   MR BRAIN W WO CONTRAST  Result Date: 05/05/2022 CLINICAL DATA:  Non-small cell lung cancer.  Staging. EXAM: MRI HEAD WITHOUT AND WITH CONTRAST TECHNIQUE: Multiplanar, multiecho pulse sequences of the brain and surrounding structures were obtained without and with intravenous contrast. CONTRAST:  105m GADAVIST GADOBUTROL 1 MMOL/ML IV SOLN COMPARISON:  02/28/2008 FINDINGS: Brain: The brainstem is normal. Single punctate focus of restricted diffusion in the posterior right cerebellum favored to represent an acute/subacute cerebellar infarction. There are a few old small vessel cerebellar infarctions but no evidence of enhancing lesion to indicate posterior fossa metastatic disease. Within the cerebral hemispheres, there is a constellation of findings that I think indicate subacute infarctions within both hemispheres, particularly in the occipital lobes but with other scattered punctate foci in the frontal and parietal regions left more than right. It is not possible to exclude the coexistence of small metastatic lesions at this time, and I would recommend repeat imaging in 6  weeks at which time the smaller lesions should have lost their enhancement if they represent punctate embolic infarctions. It is certainly possible that both processes could be present in this case. There is some petechial blood product deposition within the larger occipital infarctions. None of the lesions are associated with vasogenic edema or mass effect. No hydrocephalus or extra-axial collection. There is an incidental left frontal convexity meningioma measuring 17 x 17 x 12 mm without any mass-effect upon the brain. Vascular: Major vessels at the base of the brain show flow. Skull and upper cervical spine: Otherwise negative Sinuses/Orbits: Clear/normal Other: None IMPRESSION: 1. Numerous punctate foci of abnormal enhancement in the cerebral hemispheres, particularly in the occipital lobes. Larger occipital lobe lesions that are fairly convincingly secondary to subacute infarction. There is some petechial blood product deposition within the larger occipital infarctions. It is not possible to exclude the coexistence of small metastatic lesions at this time, and I would recommend repeat imaging in 6 weeks at which time the smaller lesions should have lost their enhancement if they represent punctate embolic infarctions. It is certainly possible that both processes could be present in this case, thus the need for short-term follow-up. In the meantime, one might consider neurological evaluation for potential etiology of embolic infarctions particularly in the posterior circulation distribution. 2. Incidental 17 x 17 x 12 mm left frontal convexity meningioma without mass effect or edema. Electronically Signed   By: MNelson ChimesM.D.   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 Recent hospitalization for pneumonia. Known effusion. Increased shortness of breath today. EXAM: CHEST - 2 VIEW COMPARISON:   CTA chest 04/11/2022, AP chest 04/17/2022 and 04/13/2022 FINDINGS: Postsurgical changes are again seen of left pneumonectomy. There is again left-sided volume loss and diffuse homogeneous opacification of the left hemithorax. Leftward mediastinal shift. Moderate calcifications are again seen within aortic arch.  The cardiac silhouette is not well evaluated. Slight interval increase in right lower lung interstitial thickening compared to 11/11 2023. The aeration in this region is improved from 04/13/2022 frontal radiograph, however. Normal variant right azygous fissure. Multiple right apical lung nodules are better seen on prior CT. No large right pleural effusion is seen. No pneumothorax is seen. Moderate multilevel degenerative disc changes of the thoracic spine. IMPRESSION: 1. Slight interval increase in right lower lung interstitial thickening compared to 11/11 2023. Nevertheless, the aeration in this region is improved from 04/13/2022 frontal radiograph. 2. Postsurgical changes of left pneumonectomy with leftward mediastinal shift. 3. Please note that multiple right apical lung nodules described on prior CT 04/11/2022 are better visualized on CT. Electronically Signed   By: Yvonne Kendall M.D.   On: 04/28/2022 12:04     ASSESSMENT/PLAN:  This is a very pleasant 66 years old Caucasian male with stage IV (T1a, N2, M1 C) non-small cell lung cancer, squamous cell carcinoma. He presented with right upper lobe lung nodule in addition to right hilar and mediastinal lymphadenopathy and metastatic disease to several bone areas as well as musculoskeletal muscles diagnosed in October 2023. The patient also has a history of a stage IIIa non-small cell lung cancer, squamous cell carcinoma diagnosed in June 2009 status post left pneumonectomy followed by 4 cycles of adjuvant systemic chemotherapy completed in April 29, 2008. The patient is positive for K-ras G12 C mutation which could be targeted in the second line  setting.   The patient is currently undergoing palliative systemic chemotherapy with carboplatin for an AUC of 5 and paclitaxel 175 mg per metered square, and Keytruda 200 mg IV every 3 weeks with Neulasta support. He is status post 2 cycles of treatment.    He was found to have new onset atrial fibrillation while in the hospital in November 2023.  He had intolerance to Eliquis due to black tarry stools and anemia.  The patient's PCP discontinued his Eliquis due to intolerance. He is currently on plavix.    Patient was recently hospitalized again toxic respiratory failure secondary to pneumonia, COPD, and some element of CHF.  The patient is CT angiogram performed on 12/4.  The patient was seen with [name] today.  [name] personally and independently reviewed his scan results and discussed results with the patient today.  The patient's scan showed evidence of disease progression but this may be secondary to having only undergone 1 cycle of chemotherapy.  [name] recommends ***  He will ***with cycle #3 today scheduled.  We will see him back for follow-up visit in 3 weeks for evaluation and repeat blood work before undergoing cycle #4.  He will continue follow closely with cardiology for his atrial fibrillation, a flutter, and congestive heart failure.  He will continue to follow with neuro-oncology regarding his history of strokes.  The patient was advised to call immediately if he has any concerning symptoms in the interval. The patient voices understanding of current disease status and treatment options and is in agreement with the current care plan. All questions were answered. The patient knows to call the clinic with any problems, questions or concerns. We can certainly see the patient much sooner if necessary         Sees cardiology on 1/9  No orders of the defined types were placed in this encounter.    I spent {CHL ONC TIME VISIT - OXBDZ:[id number]} counseling  the patient face to face. The total time spent  in the appointment was {CHL ONC TIME VISIT - QSXQK:[id number]}.  Siyon Linck L Siarra Gilkerson, PA-C 05/26/22

## 2022-05-27 ENCOUNTER — Inpatient Hospital Stay: Payer: Medicare Other

## 2022-05-27 ENCOUNTER — Inpatient Hospital Stay (HOSPITAL_BASED_OUTPATIENT_CLINIC_OR_DEPARTMENT_OTHER): Payer: Medicare Other | Admitting: Physician Assistant

## 2022-05-27 ENCOUNTER — Telehealth: Payer: Self-pay

## 2022-05-27 ENCOUNTER — Encounter: Payer: Self-pay | Admitting: Internal Medicine

## 2022-05-27 VITALS — BP 123/77 | HR 101 | Temp 97.9°F | Resp 20 | Wt 168.8 lb

## 2022-05-27 DIAGNOSIS — F17210 Nicotine dependence, cigarettes, uncomplicated: Secondary | ICD-10-CM | POA: Diagnosis not present

## 2022-05-27 DIAGNOSIS — C3492 Malignant neoplasm of unspecified part of left bronchus or lung: Secondary | ICD-10-CM | POA: Diagnosis not present

## 2022-05-27 DIAGNOSIS — C7951 Secondary malignant neoplasm of bone: Secondary | ICD-10-CM | POA: Insufficient documentation

## 2022-05-27 DIAGNOSIS — C3411 Malignant neoplasm of upper lobe, right bronchus or lung: Secondary | ICD-10-CM

## 2022-05-27 DIAGNOSIS — I4891 Unspecified atrial fibrillation: Secondary | ICD-10-CM | POA: Diagnosis not present

## 2022-05-27 DIAGNOSIS — C787 Secondary malignant neoplasm of liver and intrahepatic bile duct: Secondary | ICD-10-CM | POA: Diagnosis not present

## 2022-05-27 DIAGNOSIS — Z7189 Other specified counseling: Secondary | ICD-10-CM

## 2022-05-27 DIAGNOSIS — Z79899 Other long term (current) drug therapy: Secondary | ICD-10-CM | POA: Diagnosis not present

## 2022-05-27 LAB — CMP (CANCER CENTER ONLY)
ALT: 14 U/L (ref 0–44)
AST: 25 U/L (ref 15–41)
Albumin: 3.8 g/dL (ref 3.5–5.0)
Alkaline Phosphatase: 96 U/L (ref 38–126)
Anion gap: 10 (ref 5–15)
BUN: 17 mg/dL (ref 8–23)
CO2: 31 mmol/L (ref 22–32)
Calcium: 8.4 mg/dL — ABNORMAL LOW (ref 8.9–10.3)
Chloride: 88 mmol/L — ABNORMAL LOW (ref 98–111)
Creatinine: 1.25 mg/dL — ABNORMAL HIGH (ref 0.61–1.24)
GFR, Estimated: >60 mL/min (ref >60)
Glucose, Bld: 120 mg/dL — ABNORMAL HIGH (ref 70–99)
Potassium: 3.9 mmol/L (ref 3.5–5.1)
Sodium: 129 mmol/L — ABNORMAL LOW (ref 135–145)
Total Bilirubin: 0.3 mg/dL (ref 0.3–1.2)
Total Protein: 6.9 g/dL (ref 6.5–8.1)

## 2022-05-27 LAB — CBC WITH DIFFERENTIAL (CANCER CENTER ONLY)
Abs Immature Granulocytes: 0.12 10*3/uL — ABNORMAL HIGH (ref 0.00–0.07)
Basophils Absolute: 0.2 10*3/uL — ABNORMAL HIGH (ref 0.0–0.1)
Basophils Relative: 1 %
Eosinophils Absolute: 0.3 10*3/uL (ref 0.0–0.5)
Eosinophils Relative: 2 %
HCT: 30.2 % — ABNORMAL LOW (ref 39.0–52.0)
Hemoglobin: 10.6 g/dL — ABNORMAL LOW (ref 13.0–17.0)
Immature Granulocytes: 1 %
Lymphocytes Relative: 12 %
Lymphs Abs: 2.1 10*3/uL (ref 0.7–4.0)
MCH: 33.1 pg (ref 26.0–34.0)
MCHC: 35.1 g/dL (ref 30.0–36.0)
MCV: 94.4 fL (ref 80.0–100.0)
Monocytes Absolute: 1.9 10*3/uL — ABNORMAL HIGH (ref 0.1–1.0)
Monocytes Relative: 11 %
Neutro Abs: 13.1 10*3/uL — ABNORMAL HIGH (ref 1.7–7.7)
Neutrophils Relative %: 73 %
Platelet Count: 460 10*3/uL — ABNORMAL HIGH (ref 150–400)
RBC: 3.2 MIL/uL — ABNORMAL LOW (ref 4.22–5.81)
RDW: 13.1 % (ref 11.5–15.5)
WBC Count: 17.6 10*3/uL — ABNORMAL HIGH (ref 4.0–10.5)
nRBC: 0 % (ref 0.0–0.2)

## 2022-05-27 LAB — TSH: TSH: 2.834 u[IU]/mL (ref 0.350–4.500)

## 2022-05-27 NOTE — Telephone Encounter (Signed)
This nurse received a message from this patients wife stating that patient states he does not want to change his treatment.  He would like to know if that is possible.  Wife states that she does not think the patient completely understands what is going on and why the change in treatment may be necessary.  The request a call from the provider.   This nurse forwarded information to the provider.   No further questions or concerns.

## 2022-05-27 NOTE — Progress Notes (Signed)
DISCONTINUE ON PATHWAY REGIMEN - Non-Small Cell Lung     A cycle is every 21 days:     Pembrolizumab      Paclitaxel      Carboplatin   **Always confirm dose/schedule in your pharmacy ordering system**  REASON: Disease Progression PRIOR TREATMENT: [id number]: Pembrolizumab 200 mg + Carboplatin AUC=6 + Paclitaxel 200 mg/m2 q21 Days x 4 Cycles TREATMENT RESPONSE: Progressive Disease (PD)  START OFF PATHWAY REGIMEN - Non-Small Cell Lung   [id number]:Gemcitabine 1,000 mg/m2 IV D1,8 q21 Days:   A cycle is every 21 days:     Gemcitabine   **Always confirm dose/schedule in your pharmacy ordering system**  Patient Characteristics: Stage IV Metastatic, Squamous, Molecular Analysis Completed, Alteration Present and Targeted Therapy Exhausted or EGFR Exon 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 or KRAS G12C or HER2 Present, and No Prior Chemo/Immunotherapy or No Alteration Present, PS = 0, 1, Second Line -  Chemotherapy/Immunotherapy, Prior PD-1/PD-L1 Inhibitor + Chemotherapy or No Prior PD-1/PD-L1 Inhibitor, and Not a Candidate for Immunotherapy Therapeutic Status: Stage IV Metastatic Histology: Squamous 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

## 2022-05-29 ENCOUNTER — Inpatient Hospital Stay: Payer: Medicare Other

## 2022-05-30 ENCOUNTER — Encounter: Payer: Self-pay | Admitting: Physician Assistant

## 2022-06-02 ENCOUNTER — Inpatient Hospital Stay (HOSPITAL_BASED_OUTPATIENT_CLINIC_OR_DEPARTMENT_OTHER): Payer: Medicare Other | Admitting: Internal Medicine

## 2022-06-02 ENCOUNTER — Encounter: Payer: Self-pay | Admitting: Internal Medicine

## 2022-06-02 DIAGNOSIS — C3411 Malignant neoplasm of upper lobe, right bronchus or lung: Secondary | ICD-10-CM | POA: Diagnosis not present

## 2022-06-02 NOTE — Progress Notes (Signed)
[place] [phone]   [phone]  PROGRESS NOTE FOR TELEMEDICINE VISITS  [name], MD [place] [address] [address]  I connected withNAME@ on 06/02/22 at  9:45 AM EST by telephone visit and verified that I am speaking with the correct person using two identifiers.   I discussed the limitations, risks, security and privacy concerns of performing an evaluation and management service by telemedicine and the availability of in-person appointments. I also discussed with the patient that there may be a patient responsible charge related to this service. The patient expressed understanding and agreed to proceed.  Other persons participating in the visit and their role in the encounter: Wife  Patient's location: Home Provider's location: [place]  DIAGNOSIS: Stage IV (T1a, N2, M1 C) non-small cell lung cancer, squamous cell carcinoma presented with right upper lobe lung nodule in addition to right hilar and mediastinal lymphadenopathy and metastatic disease to several bone areas as well as musculoskeletal muscles diagnosed in October 2023. The patient also has a history of a stage IIIa non-small cell lung cancer, squamous cell carcinoma diagnosed in June 2009    Detected Alteration(s) / Biomarker(s)         Associated FDA-approved therapies Clinical Trial Availability          % cfDNA or Amplification KRAS G12C approved by FDA Adagrasib, Sotorasib Yes    6.8%   TP53 [id number] None Yes          0.9%   TP53 Q136E None Yes          0.5%   PRIOR THERAPY: 1) status post left pneumonectomy in 2009 2) 4 cycles of adjuvant chemotherapy completed in April 29, 2008  3) Palliative systemic chemotherapy with carboplatin for an AUC of 5, paclitaxel 175 mg per metered square, and converted to 100 mg IV every 3 weeks with Neulasta support.  Status post 2 cycles  First dose of treatment on 04/08/22.  This treatment was  discontinued secondary to disease progression in the liver   CURRENT THERAPY: Second line systemic chemotherapy with gemcitabine 1000 Mg/M2 on days 1 and 8 every 3 weeks.  First dose June 03, 2022.  INTERVAL HISTORY: Benjamin Case 66 y.o. male has a telephone virtual visit with me today for evaluation and more discussion of his treatment options.  The patient send few messages after the last visit asking about the change in his chemotherapy and if he can stay on the previous regimen.  He is feeling fine today with no concerning complaints except for fatigue and mild shortness of breath with exertion.  He has no current chest pain or hemoptysis.  He has no nausea, vomiting, diarrhea or constipation.  He has no headache or visual changes.  We are having the visit today for more discussion of his new treatment option.  MEDICAL HISTORY: Past Medical History:  Diagnosis Date   Arthritis    back   COPD (chronic obstructive pulmonary disease) ([facility])    Diabetes mellitus without complication (HCC)    no medications , monitors cbg at home with monitor, avg no more than 150s at home    Dyspnea    increased exertion; pt states can climb flight of stairs w/o having to stop prior to reaching top ; ongoing reports it is due to "having one lung"    GERD (gastroesophageal reflux disease)    Hepatitis    hepatitis C; pt states competed treatments, unsure what  medication he was treated with    History of chemotherapy    Hypertension    reports its been over a year since he stopped taking bp medication, reports he made her medical doctor aware that he was topping it    lung ca dx'd 12/2007   lt lobectomy   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   and back - see note in [facility] 12/26/21   Numbness    FEET BILATERAL   Pneumonia 2009   Right upper lobe pulmonary nodule 04/29/2021   Rotator cuff tear    right     ALLERGIES:  has No Known Allergies.  MEDICATIONS:  Current Outpatient Medications  Medication Sig  Dispense Refill   acetaminophen (TYLENOL) 650 MG CR tablet Take 1,300 mg by mouth every 8 (eight) hours as needed for pain.     ALPRAZolam (XANAX) 1 MG tablet Take 1 mg by mouth 3 (three) times daily as needed for anxiety.  5   Ascorbic Acid (VITAMIN C) 1000 MG tablet Take 1,000 mg by mouth daily.     aspirin EC 81 MG tablet Take 81 mg by mouth daily.     atorvastatin (LIPITOR) 10 MG tablet Take 10 mg by mouth at bedtime.      B Complex-C (SUPER B COMPLEX PO) Take 1 tablet by mouth daily.     clopidogrel (PLAVIX) 75 MG tablet Take 1 tablet (75 mg total) by mouth daily. 30 tablet 1   diltiazem (CARDIZEM SR) 60 MG 12 hr capsule Take 60 mg by mouth 2 (two) times daily.     esomeprazole (NEXIUM) 40 MG capsule Take 40 mg by mouth daily as needed (for heartburn).      folic acid (FOLVITE) 1 MG tablet Take 1 tablet (1 mg total) by mouth daily. 30 tablet 1   furosemide (LASIX) 40 MG tablet Take 1 tablet (40 mg total) by mouth daily. 30 tablet 0   guaiFENesin (MUCINEX) 600 MG 12 hr tablet Take 600 mg by mouth 2 (two) times daily.     ipratropium-albuterol (DUONEB) 0.5-2.5 (3) MG/3ML SOLN Take 3 mLs by nebulization 2 (two) times daily.     lidocaine-prilocaine (EMLA) cream Apply 1 Application topically as needed. (Patient not taking: Reported on 05/10/2022) 30 g 2   metoprolol succinate (TOPROL XL) 25 MG 24 hr tablet Take 1 tablet (25 mg total) by mouth daily. 90 tablet 1   Omega-3 Fatty Acids ([employer] FISH OIL) 1000 MG CAPS Take 1,000 mg by mouth every evening.     oxyCODONE-acetaminophen (PERCOCET) 10-325 MG tablet Take 1 tablet by mouth 5 (five) times daily as needed for pain.     potassium chloride (KLOR-CON M) 10 MEQ tablet Take 1 tablet (10 mEq total) by mouth daily. 30 tablet 0   pregabalin (LYRICA) 75 MG capsule Take 75 mg by mouth 2 (two) times daily.     thiamine (VITAMIN B-1) 100 MG tablet Take 1 tablet (100 mg total) by mouth daily. 30 tablet 1   vitamin B-12 (CYANOCOBALAMIN) 1000 MCG tablet Take  1,000 mcg by mouth daily.     No current facility-administered medications for this visit.    SURGICAL HISTORY:  Past Surgical History:  Procedure Laterality Date   ARTHROSCOPIC REPAIR ACL Right    BRONCHIAL BIOPSY  06/09/2021   Procedure: BRONCHIAL BIOPSIES;  Surgeon: Garner Nash, DO;  Location: [facility];  Service: Pulmonary;;   BRONCHIAL BRUSHINGS  06/09/2021   Procedure: BRONCHIAL BRUSHINGS;  Surgeon: Garner Nash, DO;  Location: [facility];  Service: Pulmonary;;   BRONCHIAL WASHINGS  06/09/2021   Procedure: BRONCHIAL WASHINGS;  Surgeon: Garner Nash, DO;  Location: [facility];  Service: Pulmonary;;   CATARACT EXTRACTION, BILATERAL  2019   DECOMPRESSIVE LUMBAR LAMINECTOMY LEVEL 1 N/A 03/24/2016   Procedure: L3-4 CENTRAL DECOMPRESSION LUMBER LAMINECTOMY;  Surgeon: Latanya Maudlin, MD;  Location: [facility];  Service: Orthopedics;  Laterality: N/A;   FINE NEEDLE ASPIRATION  03/23/2022   Procedure: FINE NEEDLE ASPIRATION (FNA) LINEAR;  Surgeon: Garner Nash, DO;  Location: [facility];  Service: Pulmonary;;   FOOT SURGERY Bilateral    HAND SURGERY Bilateral    LOBECTOMY Left 2009   pneumonectomy Left 2009   [name]   SHOULDER ARTHROSCOPY WITH DISTAL CLAVICLE RESECTION Right 12/14/2018   Procedure: SHOULDER ARTHROSCOPY WITH DISTAL CLAVICLE RESECTION;  Surgeon: Tania Ade, MD;  Location: [facility];  Service: Orthopedics;  Laterality: Right;   SHOULDER ARTHROSCOPY WITH ROTATOR CUFF REPAIR Right 12/14/2018   Procedure: SHOULDER ARTHROSCOPY WITH ROTATOR CUFF REPAIR;  Surgeon: Tania Ade, MD;  Location: [facility];  Service: Orthopedics;  Laterality: Right;   SHOULDER ARTHROSCOPY WITH SUBACROMIAL DECOMPRESSION Right 12/14/2018   Procedure: SHOULDER ARTHROSCOPY WITH SUBACROMIAL DECOMPRESSION;  Surgeon: Tania Ade, MD;  Location: [facility];  Service: Orthopedics;  Laterality: Right;   TRIGGER FINGER RELEASE     right hand, pointer finger    VIDEO BRONCHOSCOPY  WITH ENDOBRONCHIAL ULTRASOUND Right 03/23/2022   Procedure: VIDEO BRONCHOSCOPY WITH ENDOBRONCHIAL ULTRASOUND;  Surgeon: Garner Nash, DO;  Location: [facility];  Service: Pulmonary;  Laterality: Right;   VIDEO BRONCHOSCOPY WITH RADIAL ENDOBRONCHIAL ULTRASOUND  06/09/2021   Procedure: VIDEO BRONCHOSCOPY WITH RADIAL ENDOBRONCHIAL ULTRASOUND;  Surgeon: Garner Nash, DO;  Location: [facility];  Service: Pulmonary;;    REVIEW OF SYSTEMS:  A comprehensive review of systems was negative except for: Constitutional: positive for fatigue Respiratory: positive for dyspnea on exertion    LABORATORY DATA: Lab Results  Component Value Date   WBC 17.6 (H) 05/27/2022   HGB 10.6 (L) 05/27/2022   HCT 30.2 (L) 05/27/2022   MCV 94.4 05/27/2022   PLT 460 (H) 05/27/2022      Chemistry      Component Value Date/Time   NA 129 (L) 05/27/2022 0809   NA 138 04/26/2016 1205   K 3.9 05/27/2022 0809   K 5.0 04/26/2016 1205   CL 88 (L) 05/27/2022 0809   CL 103 04/27/2012 1253   CO2 31 05/27/2022 0809   CO2 30 (H) 04/26/2016 1205   BUN 17 05/27/2022 0809   BUN 6.0 (L) 04/26/2016 1205   CREATININE 1.25 (H) 05/27/2022 0809   CREATININE 1.3 04/26/2016 1205      Component Value Date/Time   CALCIUM 8.4 (L) 05/27/2022 0809   CALCIUM 9.8 04/26/2016 1205   ALKPHOS 96 05/27/2022 0809   ALKPHOS 60 04/26/2016 1205   AST 25 05/27/2022 0809   AST 40 (H) 04/26/2016 1205   ALT 14 05/27/2022 0809   ALT 25 04/26/2016 1205   BILITOT 0.3 05/27/2022 0809   BILITOT 0.47 04/26/2016 1205       RADIOGRAPHIC STUDIES: DG Chest 2 View  Result Date: 05/21/2022 CLINICAL DATA:  Shortness of breath and pleural effusion. EXAM: CHEST - 2 VIEW COMPARISON:  May 12, 2022 FINDINGS: The heart size and mediastinal contours are stable. Complete opacity of the left hemithorax unchanged. Increased pulmonary interstitium with ground-glass opacities are identified throughout the right lung. Minimal right pleural effusion  is noted.  The visualized skeletal structures are stable. IMPRESSION: 1. Complete opacity of the left hemithorax unchanged. 2. Increased pulmonary interstitium with ground-glass opacities throughout the right lung consistent with pulmonary edema. Electronically Signed   By: Abelardo Diesel M.D.   On: 05/21/2022 13:39   US RENAL  Result Date: 05/13/2022 CLINICAL DATA:  Acute kidney injury EXAM: RENAL / URINARY TRACT ULTRASOUND COMPLETE COMPARISON:  PET CT 03/12/2022 FINDINGS: Right Kidney: Renal measurements: 10.6 x 5.1 x 4.1 cm = volume: 116 mL. Echogenicity within normal limits. No mass or hydronephrosis visualized. Left Kidney: Renal measurements: 11.5 x 6.1 x 4.2 cm = volume: 155 2.9 cm simple cyst mL. Echogenicity within normal limits. No mass or hydronephrosis visualized. Is noted within the lower pole, best characterized as a Bosniak class 1 cyst. No follow-up imaging is recommended for this lesion. Bladder: Appears normal for degree of bladder distention. Other: Multiple solid nodules are seen scattered throughout the visualized liver measuring up to 2.8 cm, concerning for development of multiple hepatic metastases since prior PET CT examination. Small right pleural effusion noted. IMPRESSION: 1. Normal renal sonogram. 2. Multiple solid nodules scattered throughout the visualized liver, concerning for development of multiple hepatic metastases since prior PET CT examination. 3. Small right pleural effusion. Electronically Signed   By: Fidela Salisbury M.D.   On: 05/13/2022 21:16   DG Chest Port 1 View  Result Date: 05/12/2022 CLINICAL DATA:  66 year old male with hypoxia. History of lung cancer, left pneumonectomy. EXAM: PORTABLE CHEST 1 VIEW COMPARISON:  Chest CTA 05/10/2022 and earlier. FINDINGS: Portable AP semi upright view at 0631 hours. Coarse reticulonodular opacity in the right lung does appear progressed since last month and corresponds to abnormal lung opacity on the CTA 2 days ago. Layering  small to moderate right pleural effusion at that time better demonstrated on CT. Surgically absent left lung with stable leftward shift of the mediastinum. Calcified aortic atherosclerosis. Stable visualized osseous structures. IMPRESSION: 1. Coarse reticulonodular opacity in the right lung corresponding to abnormal opacity on CTA 2 days ago more suspicious for acute infection than pulmonary edema. Right pleural effusion better demonstrated on CT. 2. Left pneumonectomy. Electronically Signed   By: Genevie Ann M.D.   On: 05/12/2022 06:56   CT Angio Chest PE W and/or Wo Contrast  Result Date: 05/10/2022 CLINICAL DATA:  Pulmonary embolism suspected, high probability. Increasing shortness of breath. History of lung cancer and currently being treated. Left lung removed. EXAM: CT ANGIOGRAPHY CHEST WITH CONTRAST TECHNIQUE: Multidetector CT imaging of the chest was performed using the standard protocol during bolus administration of intravenous contrast. Multiplanar CT image reconstructions and MIPs were obtained to evaluate the vascular anatomy. RADIATION DOSE REDUCTION: This exam was performed according to the departmental dose-optimization program which includes automated exposure control, adjustment of the mA and/or kV according to patient size and/or use of iterative reconstruction technique. CONTRAST:  43m OMNIPAQUE IOHEXOL 350 MG/ML SOLN COMPARISON:  04/11/2022. FINDINGS: Cardiovascular: Heart is enlarged and there is a small pericardial effusion. Multi-vessel coronary artery calcifications are noted. There is atherosclerotic calcification of the aorta without evidence aneurysm. Pulmonary trunk is distended suggesting underlying pulmonary artery hypertension. No evidence of pulmonary embolism. Evaluation of the subsegmental arteries is limited. Mediastinum/Nodes: A conglomeration of enlarged lymph nodes are present in the mediastinum and right hilum, measuring up to 0 4.6 x 2.5 cm in the precarinal space versus 2.8  x 4.2 cm on the previous exam. No axillary lymphadenopathy. The thyroid gland, trachea, and esophagus are within normal  limits. Lungs/Pleura: Status post left pneumonectomy with chronic fibrothorax. There is a small right pleural effusion. Hazy attenuation is noted in the right lung. Multiple pulmonary nodules are seen in the right lung. An azygous lobe is noted. Patchy infiltrates are noted in the posterior aspect of the right upper lobe and right middle lobe. No pneumothorax. Upper Abdomen: Multiple ill-defined hypodensities are present in the liver measuring up to 2.4 cm. There is a left adrenal nodule measuring 2.1 cm, previously characterized as an adenoma. There is nodular thickening of the right adrenal gland. Scattered nodules are noted in the retroperitoneum bilaterally. Musculoskeletal: Scattered lucencies are noted in the bones, compatible with known metastatic disease and increased in size and number from the previous exam. There is a stable compression deformity in the superior endplate at T9. Stable postsurgical changes in the left ribs. Stable fracture in the T9 rib on the left. Stable old healed rib fractures on the right. Review of the MIP images confirms the above findings. IMPRESSION: 1. No evidence of pulmonary embolism. 2. Hazy attenuation in the right lung with patchy infiltrates in the right upper and middle lobes. 3. Small right pleural effusion. 4. Multiple pulmonary nodules in the right lung with mediastinal and right hilar lymphadenopathy, possible metastasis. 5. Multiple hepatic lesions and scattered nodules in the retroperitoneum bilaterally, concerning for metastatic disease. 6. Stable postsurgical changes of left pneumonectomy. 7. Increasing lucencies in the bones, compatible with known metastatic disease. Stable rib fracture at T9 on the left. 8. Aortic atherosclerosis. Electronically Signed   By: Brett Fairy M.D.   On: 05/10/2022 20:24   DG Chest 2 View  Result Date:  05/10/2022 CLINICAL DATA:  Shortness of breath, lung carcinoma EXAM: CHEST - 2 VIEW COMPARISON:  Previous studies including the examination of 04/28/2022 FINDINGS: There is previous left pneumonectomy. There is a interval increase in interstitial markings throughout the right lung. There is 1.5 cm nodular density in the right apical region with no significant change. Azygous fissure is seen in right upper lung field. There is minimal blunting of right lateral CP angle. IMPRESSION: There is significant interval increase in interstitial markings in right lung suggesting interstitial pneumonia or interstitial edema. Status post left pneumonectomy. Small right pleural effusion. There is 1.5 cm nodule in right apical region with no significant change. Electronically Signed   By: Elmer Picker M.D.   On: 05/10/2022 15:48   MR BRAIN W WO CONTRAST  Result Date: 05/05/2022 CLINICAL DATA:  Non-small cell lung cancer.  Staging. EXAM: MRI HEAD WITHOUT AND WITH CONTRAST TECHNIQUE: Multiplanar, multiecho pulse sequences of the brain and surrounding structures were obtained without and with intravenous contrast. CONTRAST:  30m GADAVIST GADOBUTROL 1 MMOL/ML IV SOLN COMPARISON:  02/28/2008 FINDINGS: Brain: The brainstem is normal. Single punctate focus of restricted diffusion in the posterior right cerebellum favored to represent an acute/subacute cerebellar infarction. There are a few old small vessel cerebellar infarctions but no evidence of enhancing lesion to indicate posterior fossa metastatic disease. Within the cerebral hemispheres, there is a constellation of findings that I think indicate subacute infarctions within both hemispheres, particularly in the occipital lobes but with other scattered punctate foci in the frontal and parietal regions left more than right. It is not possible to exclude the coexistence of small metastatic lesions at this time, and I would recommend repeat imaging in 6 weeks at which time  the smaller lesions should have lost their enhancement if they represent punctate embolic infarctions. It is certainly possible that  both processes could be present in this case. There is some petechial blood product deposition within the larger occipital infarctions. None of the lesions are associated with vasogenic edema or mass effect. No hydrocephalus or extra-axial collection. There is an incidental left frontal convexity meningioma measuring 17 x 17 x 12 mm without any mass-effect upon the brain. Vascular: Major vessels at the base of the brain show flow. Skull and upper cervical spine: Otherwise negative Sinuses/Orbits: Clear/normal Other: None IMPRESSION: 1. Numerous punctate foci of abnormal enhancement in the cerebral hemispheres, particularly in the occipital lobes. Larger occipital lobe lesions that are fairly convincingly secondary to subacute infarction. There is some petechial blood product deposition within the larger occipital infarctions. It is not possible to exclude the coexistence of small metastatic lesions at this time, and I would recommend repeat imaging in 6 weeks at which time the smaller lesions should have lost their enhancement if they represent punctate embolic infarctions. It is certainly possible that both processes could be present in this case, thus the need for short-term follow-up. In the meantime, one might consider neurological evaluation for potential etiology of embolic infarctions particularly in the posterior circulation distribution. 2. Incidental 17 x 17 x 12 mm left frontal convexity meningioma without mass effect or edema. Electronically Signed   By: Nelson Chimes M.D.   On: 05/05/2022 13:46    ASSESSMENT AND PLAN: This is a very pleasant 66 year old Caucasian male with stage IV (T1a, N2, M1 C) non-small cell lung cancer, squamous cell carcinoma. He presented with right upper lobe lung nodule in addition to right hilar and mediastinal lymphadenopathy and metastatic  disease to several bone areas as well as musculoskeletal muscles diagnosed in October 2023. The patient also has a history of a stage IIIa non-small cell lung cancer, squamous cell carcinoma diagnosed in June 2009 status post left pneumonectomy followed by 4 cycles of adjuvant systemic chemotherapy completed in April 29, 2008. The patient is positive for K-ras G12 C mutation which could be targeted in the second line setting.  The patient underwent palliative systemic chemotherapy with carboplatin for an AUC of 5 and paclitaxel 175 mg per metered square, and Keytruda 200 mg IV every 3 weeks with Neulasta support. He is status post 2 cycles of treatment.  This treatment was discontinued secondary to disease progression in the liver. I had a lengthy discussion with the patient and his wife today again about his current condition and treatment options. I explained to the patient that it will not be advisable for him to stay on the previous chemotherapy when he has evidence for disease progression especially in the liver.  I discussed with the patient his treatment options again and I recommended for him to proceed with the treatment with single agent gemcitabine 1000 mg/M2 on days 1 and 8 every 3 weeks and he is expected to start cycle #1 tomorrow.  The patient was also given the option of palliative care and hospice given  his poor prognosis but he is interested in treatment. I also discussed with the patient the potential other treatment options in the future including treatment with Alfred Levins (Adagrasib) or Lumakras (Sotorasib) for the KRAS G12C mutation but unfortunately with his cardiac condition and QT prolongation I would hold on this option at least for now. Other treatment option will be docetaxel and Cyramza but this will be a heart regimen on this patient especially with his current condition and frequent hospitalization with pneumonia and congestive heart failure.  I  will keep this option to be used  in the future if absolutely needed. After the discussion the patient agreed to proceed with his treatment with gemcitabine tomorrow as planned. He will come back for follow-up visit next week for reevaluation before starting day 8 of cycle #1. The patient was advised to call immediately if he has any other concerning symptoms in the interval.  I discussed the assessment and treatment plan with the patient. The patient was provided an opportunity to ask questions and all were answered. The patient agreed with the plan and demonstrated an understanding of the instructions.   The patient was advised to call back or seek an in-person evaluation if the symptoms worsen or if the condition fails to improve as anticipated.  I provided 15 minutes of non face-to-face telephone visit time during this encounter, and > 50% was spent counseling as documented under my assessment & plan.  Eilleen Kempf, MD 06/02/2022 9:29 AM  Disclaimer: This note was dictated with voice recognition software. Similar sounding words can inadvertently be transcribed and may not be corrected upon review.

## 2022-06-03 ENCOUNTER — Other Ambulatory Visit: Payer: Self-pay

## 2022-06-03 ENCOUNTER — Inpatient Hospital Stay (HOSPITAL_COMMUNITY): Payer: Medicare Other

## 2022-06-03 ENCOUNTER — Telehealth: Payer: Self-pay | Admitting: Internal Medicine

## 2022-06-03 ENCOUNTER — Encounter (HOSPITAL_COMMUNITY): Payer: Self-pay | Admitting: Internal Medicine

## 2022-06-03 ENCOUNTER — Inpatient Hospital Stay: Payer: Medicare Other

## 2022-06-03 ENCOUNTER — Encounter: Payer: Self-pay | Admitting: Internal Medicine

## 2022-06-03 ENCOUNTER — Inpatient Hospital Stay (HOSPITAL_COMMUNITY)
Admission: EM | Admit: 2022-06-03 | Discharge: 2022-07-08 | DRG: 377 | Disposition: E | Payer: Medicare Other | Attending: Internal Medicine | Admitting: Internal Medicine

## 2022-06-03 ENCOUNTER — Ambulatory Visit: Payer: Medicare Other

## 2022-06-03 ENCOUNTER — Other Ambulatory Visit: Payer: Medicare Other

## 2022-06-03 DIAGNOSIS — I480 Paroxysmal atrial fibrillation: Secondary | ICD-10-CM | POA: Diagnosis present

## 2022-06-03 DIAGNOSIS — Z79899 Other long term (current) drug therapy: Secondary | ICD-10-CM | POA: Diagnosis not present

## 2022-06-03 DIAGNOSIS — J441 Chronic obstructive pulmonary disease with (acute) exacerbation: Secondary | ICD-10-CM | POA: Diagnosis present

## 2022-06-03 DIAGNOSIS — E785 Hyperlipidemia, unspecified: Secondary | ICD-10-CM | POA: Diagnosis present

## 2022-06-03 DIAGNOSIS — Z7902 Long term (current) use of antithrombotics/antiplatelets: Secondary | ICD-10-CM

## 2022-06-03 DIAGNOSIS — I5032 Chronic diastolic (congestive) heart failure: Secondary | ICD-10-CM | POA: Insufficient documentation

## 2022-06-03 DIAGNOSIS — Z8673 Personal history of transient ischemic attack (TIA), and cerebral infarction without residual deficits: Secondary | ICD-10-CM | POA: Diagnosis not present

## 2022-06-03 DIAGNOSIS — J9621 Acute and chronic respiratory failure with hypoxia: Secondary | ICD-10-CM

## 2022-06-03 DIAGNOSIS — J440 Chronic obstructive pulmonary disease with acute lower respiratory infection: Secondary | ICD-10-CM | POA: Diagnosis present

## 2022-06-03 DIAGNOSIS — K922 Gastrointestinal hemorrhage, unspecified: Secondary | ICD-10-CM | POA: Diagnosis present

## 2022-06-03 DIAGNOSIS — C7931 Secondary malignant neoplasm of brain: Secondary | ICD-10-CM | POA: Diagnosis present

## 2022-06-03 DIAGNOSIS — R0602 Shortness of breath: Secondary | ICD-10-CM | POA: Diagnosis not present

## 2022-06-03 DIAGNOSIS — F17210 Nicotine dependence, cigarettes, uncomplicated: Secondary | ICD-10-CM | POA: Diagnosis present

## 2022-06-03 DIAGNOSIS — C3411 Malignant neoplasm of upper lobe, right bronchus or lung: Secondary | ICD-10-CM | POA: Diagnosis not present

## 2022-06-03 DIAGNOSIS — J449 Chronic obstructive pulmonary disease, unspecified: Secondary | ICD-10-CM | POA: Diagnosis present

## 2022-06-03 DIAGNOSIS — D72829 Elevated white blood cell count, unspecified: Secondary | ICD-10-CM | POA: Diagnosis not present

## 2022-06-03 DIAGNOSIS — N179 Acute kidney failure, unspecified: Secondary | ICD-10-CM | POA: Diagnosis present

## 2022-06-03 DIAGNOSIS — Z66 Do not resuscitate: Secondary | ICD-10-CM | POA: Diagnosis present

## 2022-06-03 DIAGNOSIS — F419 Anxiety disorder, unspecified: Secondary | ICD-10-CM | POA: Insufficient documentation

## 2022-06-03 DIAGNOSIS — J9611 Chronic respiratory failure with hypoxia: Secondary | ICD-10-CM | POA: Diagnosis not present

## 2022-06-03 DIAGNOSIS — J180 Bronchopneumonia, unspecified organism: Secondary | ICD-10-CM | POA: Diagnosis present

## 2022-06-03 DIAGNOSIS — Z8701 Personal history of pneumonia (recurrent): Secondary | ICD-10-CM

## 2022-06-03 DIAGNOSIS — E1140 Type 2 diabetes mellitus with diabetic neuropathy, unspecified: Secondary | ICD-10-CM | POA: Diagnosis present

## 2022-06-03 DIAGNOSIS — I959 Hypotension, unspecified: Secondary | ICD-10-CM | POA: Diagnosis present

## 2022-06-03 DIAGNOSIS — Z902 Acquired absence of lung [part of]: Secondary | ICD-10-CM

## 2022-06-03 DIAGNOSIS — C3492 Malignant neoplasm of unspecified part of left bronchus or lung: Secondary | ICD-10-CM | POA: Diagnosis present

## 2022-06-03 DIAGNOSIS — I071 Rheumatic tricuspid insufficiency: Secondary | ICD-10-CM | POA: Diagnosis present

## 2022-06-03 DIAGNOSIS — I272 Pulmonary hypertension, unspecified: Secondary | ICD-10-CM | POA: Diagnosis present

## 2022-06-03 DIAGNOSIS — Z9221 Personal history of antineoplastic chemotherapy: Secondary | ICD-10-CM

## 2022-06-03 DIAGNOSIS — Z9981 Dependence on supplemental oxygen: Secondary | ICD-10-CM

## 2022-06-03 DIAGNOSIS — C3491 Malignant neoplasm of unspecified part of right bronchus or lung: Secondary | ICD-10-CM | POA: Diagnosis present

## 2022-06-03 DIAGNOSIS — M545 Low back pain, unspecified: Secondary | ICD-10-CM | POA: Diagnosis present

## 2022-06-03 DIAGNOSIS — J9622 Acute and chronic respiratory failure with hypercapnia: Secondary | ICD-10-CM | POA: Diagnosis present

## 2022-06-03 DIAGNOSIS — C787 Secondary malignant neoplasm of liver and intrahepatic bile duct: Secondary | ICD-10-CM | POA: Diagnosis present

## 2022-06-03 DIAGNOSIS — Z7982 Long term (current) use of aspirin: Secondary | ICD-10-CM

## 2022-06-03 DIAGNOSIS — E8729 Other acidosis: Secondary | ICD-10-CM | POA: Diagnosis present

## 2022-06-03 DIAGNOSIS — Z7189 Other specified counseling: Secondary | ICD-10-CM | POA: Diagnosis not present

## 2022-06-03 DIAGNOSIS — C771 Secondary and unspecified malignant neoplasm of intrathoracic lymph nodes: Secondary | ICD-10-CM | POA: Diagnosis present

## 2022-06-03 DIAGNOSIS — Z809 Family history of malignant neoplasm, unspecified: Secondary | ICD-10-CM

## 2022-06-03 DIAGNOSIS — Z515 Encounter for palliative care: Secondary | ICD-10-CM

## 2022-06-03 DIAGNOSIS — K219 Gastro-esophageal reflux disease without esophagitis: Secondary | ICD-10-CM | POA: Insufficient documentation

## 2022-06-03 DIAGNOSIS — D62 Acute posthemorrhagic anemia: Secondary | ICD-10-CM | POA: Diagnosis present

## 2022-06-03 DIAGNOSIS — I110 Hypertensive heart disease with heart failure: Secondary | ICD-10-CM | POA: Diagnosis present

## 2022-06-03 DIAGNOSIS — D75839 Thrombocytosis, unspecified: Secondary | ICD-10-CM | POA: Diagnosis present

## 2022-06-03 HISTORY — DX: Unspecified atrial fibrillation: I48.91

## 2022-06-03 HISTORY — DX: Cerebral infarction, unspecified: I63.9

## 2022-06-03 LAB — PROTIME-INR
INR: 1.1 (ref 0.8–1.2)
Prothrombin Time: 14 seconds (ref 11.4–15.2)

## 2022-06-03 LAB — COMPREHENSIVE METABOLIC PANEL WITH GFR
ALT: 34 U/L (ref 0–44)
AST: 32 U/L (ref 15–41)
Albumin: 3.2 g/dL — ABNORMAL LOW (ref 3.5–5.0)
Alkaline Phosphatase: 65 U/L (ref 38–126)
Anion gap: 9 (ref 5–15)
BUN: 41 mg/dL — ABNORMAL HIGH (ref 8–23)
CO2: 30 mmol/L (ref 22–32)
Calcium: 8.1 mg/dL — ABNORMAL LOW (ref 8.9–10.3)
Chloride: 98 mmol/L (ref 98–111)
Creatinine, Ser: 1.64 mg/dL — ABNORMAL HIGH (ref 0.61–1.24)
GFR, Estimated: 45 mL/min — ABNORMAL LOW
Glucose, Bld: 144 mg/dL — ABNORMAL HIGH (ref 70–99)
Potassium: 4 mmol/L (ref 3.5–5.1)
Sodium: 137 mmol/L (ref 135–145)
Total Bilirubin: 0.6 mg/dL (ref 0.3–1.2)
Total Protein: 6.6 g/dL (ref 6.5–8.1)

## 2022-06-03 LAB — CBC WITH DIFFERENTIAL/PLATELET
Abs Immature Granulocytes: 0.17 10*3/uL — ABNORMAL HIGH (ref 0.00–0.07)
Basophils Absolute: 0.1 10*3/uL (ref 0.0–0.1)
Basophils Relative: 1 %
Eosinophils Absolute: 0.2 10*3/uL (ref 0.0–0.5)
Eosinophils Relative: 1 %
HCT: 18.2 % — ABNORMAL LOW (ref 39.0–52.0)
Hemoglobin: 5.8 g/dL — CL (ref 13.0–17.0)
Immature Granulocytes: 1 %
Lymphocytes Relative: 7 %
Lymphs Abs: 1.4 10*3/uL (ref 0.7–4.0)
MCH: 33.9 pg (ref 26.0–34.0)
MCHC: 31.9 g/dL (ref 30.0–36.0)
MCV: 106.4 fL — ABNORMAL HIGH (ref 80.0–100.0)
Monocytes Absolute: 1.5 10*3/uL — ABNORMAL HIGH (ref 0.1–1.0)
Monocytes Relative: 8 %
Neutro Abs: 17 10*3/uL — ABNORMAL HIGH (ref 1.7–7.7)
Neutrophils Relative %: 82 %
Platelets: 542 10*3/uL — ABNORMAL HIGH (ref 150–400)
RBC: 1.71 MIL/uL — ABNORMAL LOW (ref 4.22–5.81)
RDW: 16.7 % — ABNORMAL HIGH (ref 11.5–15.5)
WBC: 20.3 10*3/uL — ABNORMAL HIGH (ref 4.0–10.5)
nRBC: 0.1 % (ref 0.0–0.2)

## 2022-06-03 LAB — POC OCCULT BLOOD, ED: Fecal Occult Bld: POSITIVE — AB

## 2022-06-03 LAB — PREPARE RBC (CROSSMATCH)

## 2022-06-03 MED ORDER — SODIUM CHLORIDE 0.9 % IV BOLUS
1000.0000 mL | Freq: Once | INTRAVENOUS | Status: AC
  Administered 2022-06-03: 1000 mL via INTRAVENOUS

## 2022-06-03 MED ORDER — SODIUM CHLORIDE 0.9 % IV SOLN: INTRAVENOUS | Status: DC

## 2022-06-03 MED ORDER — PANTOPRAZOLE 80MG IVPB - SIMPLE MED
80.0000 mg | Freq: Once | INTRAVENOUS | Status: AC
  Administered 2022-06-03: 80 mg via INTRAVENOUS
  Filled 2022-06-03: qty 80

## 2022-06-03 MED ORDER — ONDANSETRON HCL 4 MG/2ML IJ SOLN: 4.0000 mg | Freq: Four times a day (QID) | INTRAMUSCULAR | Status: DC | PRN

## 2022-06-03 MED ORDER — IPRATROPIUM-ALBUTEROL 0.5-2.5 (3) MG/3ML IN SOLN
3.0000 mL | Freq: Four times a day (QID) | RESPIRATORY_TRACT | Status: DC | PRN
  Administered 2022-06-03 – 2022-06-04 (×2): 3 mL via RESPIRATORY_TRACT
  Filled 2022-06-03: qty 3

## 2022-06-03 MED ORDER — CALCIUM CARBONATE ANTACID 500 MG PO CHEW
1.0000 | CHEWABLE_TABLET | Freq: Two times a day (BID) | ORAL | Status: DC
  Administered 2022-06-04 – 2022-06-06 (×6): 200 mg via ORAL
  Filled 2022-06-03 (×6): qty 1

## 2022-06-03 MED ORDER — ALPRAZOLAM 0.5 MG PO TABS
1.0000 mg | ORAL_TABLET | Freq: Every evening | ORAL | Status: AC | PRN
  Administered 2022-06-03: 1 mg via ORAL
  Filled 2022-06-03: qty 2

## 2022-06-03 MED ORDER — PANTOPRAZOLE INFUSION (NEW) - SIMPLE MED
8.0000 mg/h | INTRAVENOUS | Status: DC
  Administered 2022-06-03 – 2022-06-04 (×3): 8 mg/h via INTRAVENOUS
  Filled 2022-06-03: qty 80
  Filled 2022-06-03: qty 100
  Filled 2022-06-03 (×2): qty 80

## 2022-06-03 MED ORDER — ONDANSETRON HCL 4 MG PO TABS: 4.0000 mg | ORAL_TABLET | Freq: Four times a day (QID) | ORAL | Status: DC | PRN

## 2022-06-03 MED ORDER — OXYCODONE-ACETAMINOPHEN 5-325 MG PO TABS
1.0000 | ORAL_TABLET | Freq: Four times a day (QID) | ORAL | Status: DC | PRN
  Administered 2022-06-03 – 2022-06-04 (×2): 1 via ORAL
  Filled 2022-06-03 (×2): qty 1

## 2022-06-03 MED ORDER — SODIUM CHLORIDE 0.9% IV SOLUTION: Freq: Once | INTRAVENOUS | Status: AC

## 2022-06-03 NOTE — H&P (Signed)
History and Physical    Patient: Benjamin Case CZY:[id number] DOB: 06-Sep-1955 DOA: 05/30/2022 DOS: the patient was seen and examined on 06/04/2022 PCP: [name], MD  Patient coming from: Home  Chief Complaint: No chief complaint on file.  HPI: Benjamin Case is a 66 y.o. male with medical history significant of S4 SCC of lung, s/p left pneumonectomy, PAF, CVA, chronic hypoxia on 2L [place]. Presenting with dark stools. He was in his normal state of health until a couple of days ago. He had a couple episodes of dark stools. He has not been using NSAIDs per his account. He is on plavix, but not full anticoagulation.These episodes continued through this morning. Early this morning he felt very weak and short of breath. He didn't have any chest pain or syncopal episodes. When his symptoms did not improve, he decided to come to the ED for evaluation. He denies any other aggravating or alleviating factors.   Review of Systems: As mentioned in the history of present illness. All other systems reviewed and are negative. Past Medical History:  Diagnosis Date   Arthritis    back   COPD (chronic obstructive pulmonary disease) ([place])    Diabetes mellitus without complication (HCC)    no medications , monitors cbg at home with monitor, avg no more than 150s at home    Dyspnea    increased exertion; pt states can climb flight of stairs w/o having to stop prior to reaching top ; ongoing reports it is due to "having one lung"    GERD (gastroesophageal reflux disease)    Hepatitis    hepatitis C; pt states competed treatments, unsure what medication he was treated with    History of chemotherapy    Hypertension    reports its been over a year since he stopped taking bp medication, reports he made her medical doctor aware that he was topping it    lung ca dx'd 12/2007   lt lobectomy   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   and back - see note in [facility] 12/26/21   Numbness    FEET BILATERAL   Pneumonia 2009    Right upper lobe pulmonary nodule 04/29/2021   Rotator cuff tear    right    Past Surgical History:  Procedure Laterality Date   ARTHROSCOPIC REPAIR ACL Right    BRONCHIAL BIOPSY  06/09/2021   Procedure: BRONCHIAL BIOPSIES;  Surgeon: Garner Nash, DO;  Location: [facility];  Service: Pulmonary;;   BRONCHIAL BRUSHINGS  06/09/2021   Procedure: BRONCHIAL BRUSHINGS;  Surgeon: Garner Nash, DO;  Location: [facility];  Service: Pulmonary;;   BRONCHIAL WASHINGS  06/09/2021   Procedure: BRONCHIAL WASHINGS;  Surgeon: Garner Nash, DO;  Location: [facility];  Service: Pulmonary;;   CATARACT EXTRACTION, BILATERAL  2019   DECOMPRESSIVE LUMBAR LAMINECTOMY LEVEL 1 N/A 03/24/2016   Procedure: L3-4 CENTRAL DECOMPRESSION LUMBER LAMINECTOMY;  Surgeon: Latanya Maudlin, MD;  [facility];  Service: Orthopedics;  Laterality: N/A;   FINE NEEDLE ASPIRATION  03/23/2022   Procedure: FINE NEEDLE ASPIRATION (FNA) LINEAR;  Surgeon: Garner Nash, DO;  Location: [facility];  Service: Pulmonary;;   FOOT SURGERY Bilateral    HAND SURGERY Bilateral    LOBECTOMY Left 2009   pneumonectomy Left 2009   [name]   SHOULDER ARTHROSCOPY WITH DISTAL CLAVICLE RESECTION Right 12/14/2018   Procedure: SHOULDER ARTHROSCOPY WITH DISTAL CLAVICLE RESECTION;  Surgeon: Tania Ade, MD;  [facility];  Service: Orthopedics;  Laterality:  Right;   SHOULDER ARTHROSCOPY WITH ROTATOR CUFF REPAIR Right 12/14/2018   Procedure: SHOULDER ARTHROSCOPY WITH ROTATOR CUFF REPAIR;  Surgeon: Tania Ade, MD;  [facility];  Service: Orthopedics;  Laterality: Right;   SHOULDER ARTHROSCOPY WITH SUBACROMIAL DECOMPRESSION Right 12/14/2018   Procedure: SHOULDER ARTHROSCOPY WITH SUBACROMIAL DECOMPRESSION;  Surgeon: Tania Ade, MD;  [facility];  Service: Orthopedics;  Laterality: Right;   TRIGGER FINGER RELEASE     right hand, pointer finger    VIDEO BRONCHOSCOPY WITH ENDOBRONCHIAL ULTRASOUND Right  03/23/2022   Procedure: VIDEO BRONCHOSCOPY WITH ENDOBRONCHIAL ULTRASOUND;  Surgeon: Garner Nash, DO;  Location: [facility];  Service: Pulmonary;  Laterality: Right;   VIDEO BRONCHOSCOPY WITH RADIAL ENDOBRONCHIAL ULTRASOUND  06/09/2021   Procedure: VIDEO BRONCHOSCOPY WITH RADIAL ENDOBRONCHIAL ULTRASOUND;  Surgeon: Garner Nash, DO;  Location: [facility];  Service: Pulmonary;;   Social History:  reports that he has been smoking cigarettes. He has a 22.50 pack-year smoking history. He has never used smokeless tobacco. He reports current alcohol use. He reports that he does not use drugs.  No Known Allergies  Family History  Problem Relation Age of Onset   Cancer Mother     Prior to Admission medications   Medication Sig Start Date End Date Taking? Authorizing Provider  acetaminophen (TYLENOL) 650 MG CR tablet Take 1,300 mg by mouth every 8 (eight) hours as needed for pain.    [provider]  ALPRAZolam Duanne Moron) 1 MG tablet Take 1 mg by mouth 3 (three) times daily as needed for anxiety. 04/20/15   [provider]  Ascorbic Acid (VITAMIN C) 1000 MG tablet Take 1,000 mg by mouth daily.    [provider]  aspirin EC 81 MG tablet Take 81 mg by mouth daily.    [provider]  atorvastatin (LIPITOR) 10 MG tablet Take 10 mg by mouth at bedtime.     [provider]  B Complex-C (SUPER B COMPLEX PO) Take 1 tablet by mouth daily.    [provider]  clopidogrel (PLAVIX) 75 MG tablet Take 1 tablet (75 mg total) by mouth daily. 05/14/22   Thurnell Lose, MD  diltiazem (CARDIZEM SR) 60 MG 12 hr capsule Take 60 mg by mouth 2 (two) times daily.    [provider]  esomeprazole (NEXIUM) 40 MG capsule Take 40 mg by mouth daily as needed (for heartburn).     [provider]  folic acid (FOLVITE) 1 MG tablet Take 1 tablet (1 mg total) by mouth daily. 04/18/22   Shawna Clamp, MD  furosemide (LASIX) 40 MG tablet Take 1 tablet  (40 mg total) by mouth daily. 05/16/22 06/15/22  Thurnell Lose, MD  guaiFENesin (MUCINEX) 600 MG 12 hr tablet Take 600 mg by mouth 2 (two) times daily.    [provider]  ipratropium-albuterol (DUONEB) 0.5-2.5 (3) MG/3ML SOLN Take 3 mLs by nebulization 2 (two) times daily.    [provider]  lidocaine-prilocaine (EMLA) cream Apply 1 Application topically as needed. Patient not taking: Reported on 05/10/2022 05/06/22   Heilingoetter, Cassandra L, PA-C  metoprolol succinate (TOPROL XL) 25 MG 24 hr tablet Take 1 tablet (25 mg total) by mouth daily. 04/27/22   Loel Dubonnet, NP  Omega-3 Fatty Acids ([employer] FISH OIL) 1000 MG CAPS Take 1,000 mg by mouth every evening.    [provider]  oxyCODONE-acetaminophen (PERCOCET) 10-325 MG tablet Take 1 tablet by mouth 5 (five) times daily as needed for pain. 05/04/21  [provider]  potassium chloride (KLOR-CON M) 10 MEQ tablet Take 1 tablet (10 mEq total) by mouth daily. 05/16/22   Thurnell Lose, MD  pregabalin (LYRICA) 75 MG capsule Take 75 mg by mouth 2 (two) times daily. 02/22/22   [provider]  thiamine (VITAMIN B-1) 100 MG tablet Take 1 tablet (100 mg total) by mouth daily. 04/18/22   Shawna Clamp, MD  vitamin B-12 (CYANOCOBALAMIN) 1000 MCG tablet Take 1,000 mcg by mouth daily.    [provider]    Physical Exam: Vitals:   05/13/2022 1345 05/18/2022 1400 05/08/2022 1415 05/22/2022 1425  BP: (!) 91/57  110/67   Pulse: 92 (!) 113 (!) 109 (!) 102  Resp: 13 (!) 22 15 16   Temp:      SpO2: 100% (!) 88% 100% 100%  Weight:      Height:       General: 66 y.o. male resting in bed in NAD Eyes: PERRL, normal sclera ENMT: Nares patent w/o discharge, orophaynx clear, dentition normal, ears w/o discharge/lesions/ulcers Neck: Supple, trachea midline Cardiovascular: tachy, +S1, S2, no m/g/r, equal pulses throughout Respiratory: decreased on left, clear otherwise, no w/r/r, normal WOB GI: BS+, NDNT,  no masses noted, no organomegaly noted MSK: No e/c/c; pallor Neuro: A&O x 3, no focal deficits Psyc: Appropriate interaction and affect, calm/cooperative  Data Reviewed:  Results for orders placed or performed during the hospital encounter of 05/09/2022 (from the past 24 hour(s))  Comprehensive metabolic panel     Status: Abnormal   Collection Time: 05/26/2022  1:15 PM  Result Value Ref Range   Sodium 137 135 - 145 mmol/L   Potassium 4.0 3.5 - 5.1 mmol/L   Chloride 98 98 - 111 mmol/L   CO2 30 22 - 32 mmol/L   Glucose, Bld 144 (H) 70 - 99 mg/dL   BUN 41 (H) 8 - 23 mg/dL   Creatinine, Ser 1.64 (H) 0.61 - 1.24 mg/dL   Calcium 8.1 (L) 8.9 - 10.3 mg/dL   Total Protein 6.6 6.5 - 8.1 g/dL   Albumin 3.2 (L) 3.5 - 5.0 g/dL   AST 32 15 - 41 U/L   ALT 34 0 - 44 U/L   Alkaline Phosphatase 65 38 - 126 U/L   Total Bilirubin 0.6 0.3 - 1.2 mg/dL   GFR, Estimated 45 (L) >60 mL/min   Anion gap 9 5 - 15  CBC with Differential     Status: Abnormal   Collection Time: 05/08/2022  1:15 PM  Result Value Ref Range   WBC 20.3 (H) 4.0 - 10.5 K/uL   RBC 1.71 (L) 4.22 - 5.81 MIL/uL   Hemoglobin 5.8 (LL) 13.0 - 17.0 g/dL   HCT 18.2 (L) 39.0 - 52.0 %   MCV 106.4 (H) 80.0 - 100.0 fL   MCH 33.9 26.0 - 34.0 pg   MCHC 31.9 30.0 - 36.0 g/dL   RDW 16.7 (H) 11.5 - 15.5 %   Platelets 542 (H) 150 - 400 K/uL   nRBC 0.1 0.0 - 0.2 %   Neutrophils Relative % 82 %   Neutro Abs 17.0 (H) 1.7 - 7.7 K/uL   Lymphocytes Relative 7 %   Lymphs Abs 1.4 0.7 - 4.0 K/uL   Monocytes Relative 8 %   Monocytes Absolute 1.5 (H) 0.1 - 1.0 K/uL   Eosinophils Relative 1 %   Eosinophils Absolute 0.2 0.0 - 0.5 K/uL   Basophils Relative 1 %   Basophils Absolute 0.1 0.0 - 0.1 K/uL  Immature Granulocytes 1 %   Abs Immature Granulocytes 0.17 (H) 0.00 - 0.07 K/uL  Protime-INR     Status: None   Collection Time: 05/08/2022  1:15 PM  Result Value Ref Range   Prothrombin Time 14.0 11.4 - 15.2 seconds   INR 1.1 0.8 - 1.2  Type and screen  [place]     Status: None (Preliminary result)   Collection Time: 05/17/2022  1:15 PM  Result Value Ref Range   ABO/RH(D) PENDING    Antibody Screen PENDING    Sample Expiration      06/06/2022,2359 Performed at [facility], [place] [address], [place], [place] [address]   POC occult blood, ED     Status: Abnormal   Collection Time: 05/28/2022  1:58 PM  Result Value Ref Range   Fecal Occult Bld POSITIVE (A) NEGATIVE  Prepare RBC (crossmatch)     Status: None   Collection Time: 05/19/2022  3:00 PM  Result Value Ref Range   Order Confirmation      ORDER PROCESSED BY BLOOD BANK Performed at [facility] [address] [place], [place] [address]    Assessment and Plan: GIB     - admit to inpt, progressive     - trend q6H H&H; transfuse for Hgb < 7; 2 units ordered in ED     - [facility] to see; NPO til they evaluated; if no procedure tonight, can add clear liquids     - protonix  AKI     - likely secondary to GI losses and hypotension     - renal US     - watch nephrotoxins     - hold BP affecting meds for tonight  PAF Chronic diastolic HF     - hold BP affecting meds for tonight     - will be receiving 2 units pRBCs     - watch fluid status  HLD     - continue home regimen when confirmed  Hx of CVA     - hold plavix d/t GIB  S4 SCC of lung S/p left pneumonectomy Chronic hypoxic respiratory failure on 2L at baseline COPD     - on palliative chemo w/ [name]. He has been added to the team     - continue home COPD regimen when confirmed  Leukocytosis     - appears chronic     - on neulasta     - trend for now  GERD     - protonix  Anxiety     - continue home regimen when confirmed  Hypocalcemia     - corrects to 8.7     - add TUMS  Advance Care Planning:   Code Status: DNI, confirmed through multiple lines of questioning  Consults: [facility]  Family Communication: None at bedside  Severity of  Illness: The appropriate patient status for this patient is INPATIENT. Inpatient status is judged to be reasonable and necessary in order to provide the required intensity of service to ensure the patient's safety. The patient's presenting symptoms, physical exam findings, and initial radiographic and laboratory data in the context of their chronic comorbidities is felt to place them at high risk for further clinical deterioration. Furthermore, it is not anticipated that the patient will be medically stable for discharge from the hospital within 2 midnights of admission.   * I certify that at the point of admission it is my clinical judgment that the  patient will require inpatient hospital care spanning beyond 2 midnights from the point of admission due to high intensity of service, high risk for further deterioration and high frequency of surveillance required.*  Author: Jonnie Finner, DO 06/04/2022 2:36 PM  For on call review [email or web address].

## 2022-06-03 NOTE — ED Notes (Signed)
Blood administer started. JRPRN

## 2022-06-03 NOTE — Consult Note (Signed)
Reason for Consult: GI bleed Referring Physician: Hospital team  Benjamin Case is an 66 y.o. male.  HPI: Patient seen and examined and our office computer chart reviewed and his hospital computer chart reviewed and his case discussed with his wife as well and he is familiar to me for hepatitis C treatment years ago as well as a colonoscopy in the fall and 1 over 10 years before that and he has not been having any GI complaints although did have some black stools about a month ago on Eliquis no workup was done and his medicines were changed and has been on an aspirin a day and Plavix but has had 3 days of formed black bowel movements but is eating fine and wants to eat now and other than occasional shortness of breath has no other complaints and has been on chemo and immunotherapy for about a month and we answered all of their questions  Past Medical History:  Diagnosis Date   Arthritis    back   COPD (chronic obstructive pulmonary disease) ([place])    Diabetes mellitus without complication ([place])    no medications , monitors cbg at home with monitor, avg no more than 150s at home    Dyspnea    increased exertion; pt states can climb flight of stairs w/o having to stop prior to reaching top ; ongoing reports it is due to "having one lung"    GERD (gastroesophageal reflux disease)    Hepatitis    hepatitis C; pt states competed treatments, unsure what medication he was treated with    History of chemotherapy    Hypertension    reports its been over a year since he stopped taking bp medication, reports he made her medical doctor aware that he was topping it    lung ca dx'd 12/2007   lt lobectomy   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   and back - see note in [facility] 12/26/21   Numbness    FEET BILATERAL   Pneumonia 2009   Right upper lobe pulmonary nodule 04/29/2021   Rotator cuff tear    right     Past Surgical History:  Procedure Laterality Date   ARTHROSCOPIC REPAIR ACL Right    BRONCHIAL  BIOPSY  06/09/2021   Procedure: BRONCHIAL BIOPSIES;  Surgeon: Garner Nash, DO;  Location: [facility];  Service: Pulmonary;;   BRONCHIAL BRUSHINGS  06/09/2021   Procedure: BRONCHIAL BRUSHINGS;  Surgeon: Garner Nash, DO;  Location: [facility];  Service: Pulmonary;;   BRONCHIAL WASHINGS  06/09/2021   Procedure: BRONCHIAL WASHINGS;  Surgeon: Garner Nash, DO;  Location: [facility];  Service: Pulmonary;;   CATARACT EXTRACTION, BILATERAL  2019   DECOMPRESSIVE LUMBAR LAMINECTOMY LEVEL 1 N/A 03/24/2016   Procedure: L3-4 CENTRAL DECOMPRESSION LUMBER LAMINECTOMY;  Surgeon: Latanya Maudlin, MD;  [facility];  Service: Orthopedics;  Laterality: N/A;   FINE NEEDLE ASPIRATION  03/23/2022   Procedure: FINE NEEDLE ASPIRATION (FNA) LINEAR;  Surgeon: Garner Nash, DO;  Location: [facility];  Service: Pulmonary;;   FOOT SURGERY Bilateral    HAND SURGERY Bilateral    LOBECTOMY Left 2009   pneumonectomy Left 2009   [name]   SHOULDER ARTHROSCOPY WITH DISTAL CLAVICLE RESECTION Right 12/14/2018   Procedure: SHOULDER ARTHROSCOPY WITH DISTAL CLAVICLE RESECTION;  Surgeon: Tania Ade, MD;  [facility];  Service: Orthopedics;  Laterality: Right;   SHOULDER ARTHROSCOPY WITH ROTATOR CUFF REPAIR Right 12/14/2018   Procedure: SHOULDER ARTHROSCOPY WITH ROTATOR CUFF  REPAIR;  Surgeon: Tania Ade, MD;  [facility];  Service: Orthopedics;  Laterality: Right;   SHOULDER ARTHROSCOPY WITH SUBACROMIAL DECOMPRESSION Right 12/14/2018   Procedure: SHOULDER ARTHROSCOPY WITH SUBACROMIAL DECOMPRESSION;  Surgeon: Tania Ade, MD;  [facility];  Service: Orthopedics;  Laterality: Right;   TRIGGER FINGER RELEASE     right hand, pointer finger    VIDEO BRONCHOSCOPY WITH ENDOBRONCHIAL ULTRASOUND Right 03/23/2022   Procedure: VIDEO BRONCHOSCOPY WITH ENDOBRONCHIAL ULTRASOUND;  Surgeon: Garner Nash, DO;  Location: [facility];  Service: Pulmonary;  Laterality: Right;   VIDEO  BRONCHOSCOPY WITH RADIAL ENDOBRONCHIAL ULTRASOUND  06/09/2021   Procedure: VIDEO BRONCHOSCOPY WITH RADIAL ENDOBRONCHIAL ULTRASOUND;  Surgeon: Garner Nash, DO;  Location: [facility];  Service: Pulmonary;;    Family History  Problem Relation Age of Onset   Cancer Mother     Social History:  reports that he has been smoking cigarettes. He has a 22.50 pack-year smoking history. He has never used smokeless tobacco. He reports current alcohol use. He reports that he does not use drugs.  Allergies: No Known Allergies  Medications: I have reviewed the patient's current medications.  Results for orders placed or performed during the hospital encounter of 06/01/2022 (from the past 48 hour(s))  Comprehensive metabolic panel     Status: Abnormal   Collection Time: 05/14/2022  1:15 PM  Result Value Ref Range   Sodium 137 135 - 145 mmol/L   Potassium 4.0 3.5 - 5.1 mmol/L   Chloride 98 98 - 111 mmol/L   CO2 30 22 - 32 mmol/L   Glucose, Bld 144 (H) 70 - 99 mg/dL    Comment: Glucose reference range applies only to samples taken after fasting for at least 8 hours.   BUN 41 (H) 8 - 23 mg/dL   Creatinine, Ser 1.64 (H) 0.61 - 1.24 mg/dL   Calcium 8.1 (L) 8.9 - 10.3 mg/dL   Total Protein 6.6 6.5 - 8.1 g/dL   Albumin 3.2 (L) 3.5 - 5.0 g/dL   AST 32 15 - 41 U/L   ALT 34 0 - 44 U/L   Alkaline Phosphatase 65 38 - 126 U/L   Total Bilirubin 0.6 0.3 - 1.2 mg/dL   GFR, Estimated 45 (L) >60 mL/min    Comment: (NOTE) Calculated using the CKD-EPI Creatinine Equation (2021)    Anion gap 9 5 - 15    Comment: Performed at [facility], [place] [address], [place], [place] [address]  CBC with Differential     Status: Abnormal   Collection Time: 05/15/2022  1:15 PM  Result Value Ref Range   WBC 20.3 (H) 4.0 - 10.5 K/uL   RBC 1.71 (L) 4.22 - 5.81 MIL/uL   Hemoglobin 5.8 (LL) 13.0 - 17.0 g/dL    Comment: REPEATED TO VERIFY THIS CRITICAL RESULT HAS VERIFIED AND BEEN CALLED TO LEWIS,A. RN BY  NICOLE MCCOY ON 12 28 2023 AT 37, AND HAS BEEN READ BACK. CRITICAL RESULT VERIFIED    HCT 18.2 (L) 39.0 - 52.0 %   MCV 106.4 (H) 80.0 - 100.0 fL   MCH 33.9 26.0 - 34.0 pg   MCHC 31.9 30.0 - 36.0 g/dL   RDW 16.7 (H) 11.5 - 15.5 %   Platelets 542 (H) 150 - 400 K/uL   nRBC 0.1 0.0 - 0.2 %   Neutrophils Relative % 82 %   Neutro Abs 17.0 (H) 1.7 - 7.7 K/uL   Lymphocytes Relative 7 %   Lymphs Abs 1.4  0.7 - 4.0 K/uL   Monocytes Relative 8 %   Monocytes Absolute 1.5 (H) 0.1 - 1.0 K/uL   Eosinophils Relative 1 %   Eosinophils Absolute 0.2 0.0 - 0.5 K/uL   Basophils Relative 1 %   Basophils Absolute 0.1 0.0 - 0.1 K/uL   Immature Granulocytes 1 %   Abs Immature Granulocytes 0.17 (H) 0.00 - 0.07 K/uL    Comment: Performed at [facility], [place] [address], [place], [place] [address]  Protime-INR     Status: None   Collection Time: 05/11/2022  1:15 PM  Result Value Ref Range   Prothrombin Time 14.0 11.4 - 15.2 seconds   INR 1.1 0.8 - 1.2    Comment: (NOTE) INR goal varies based on device and disease states. Performed at [facility], [place] [address], [place], [place] [address]   Type and screen [place]     Status: None (Preliminary result)   Collection Time: 06/02/2022  1:15 PM  Result Value Ref Range   ABO/RH(D) O POS    Antibody Screen NEG    Sample Expiration 06/06/2022,2359    Unit Number [id number]    Blood Component Type RED CELLS,LR    Unit division 00    Status of Unit ALLOCATED    Transfusion Status OK TO TRANSFUSE    Crossmatch Result Compatible    Unit Number [id number]    Blood Component Type RED CELLS,LR    Unit division 00    Status of Unit ISSUED    Transfusion Status OK TO TRANSFUSE    Crossmatch Result      Compatible Performed at [facility], [place] [address], [place], [place] [address]   POC occult blood, ED     Status: Abnormal   Collection Time: 05/20/2022  1:58 PM  Result  Value Ref Range   Fecal Occult Bld POSITIVE (A) NEGATIVE  Prepare RBC (crossmatch)     Status: None   Collection Time: 05/31/2022  3:00 PM  Result Value Ref Range   Order Confirmation      ORDER PROCESSED BY BLOOD BANK Performed at [place] [address] [place], [place] [address]     No results found.  Review of Systems negative except above Blood pressure 103/73, pulse 100, temperature 97.8 F (36.6 C), temperature source Oral, resp. rate 19, height 5\' 10"  (1.778 m), weight 76.6 kg, SpO2 93 %. Physical Exam vital signs stable afebrile no acute distress in okay spirits abdomen is soft nontender labs and multiple CT PET scans etc. all reviewed increased BUN and creatinine significant decreased hemoglobin INR and platelets okay  Assessment/Plan: GI bleeding and patient on aspirin and Plavix with multiple medical issues Plan: Will allow clear liquids so no signs of active bleeding and perfect world would proceed with an endoscopy in 3 to 5 days off of Plavix and aspirin and I am not sure patient will agree to stay in the hospital that long and if no signs of bleeding any tolerates slow diet advancement and we continue pump inhibitors possibly we can set this up early next week as an outpatient please call us sooner if signs of active bleeding or GI question or problem otherwise we will check on tomorrow and the procedure endoscopy was discussed with the patient and his wife and compared to the colonoscopies he has had in the past  [place] E 05/09/2022, 5:49 PM

## 2022-06-03 NOTE — ED Triage Notes (Signed)
Patient BIB [facility] for SOB, dark tarry stool and hypotension. Patient has a hx of lung cancer, left lung has been removed. 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 Patient is actively getting Chemo and was to start a new chemo today but due to shortness of breath they refused and sent to ER. Hx of A Fib, on low dose of Plavix. Takes Metoprolol, Cardizem and Lasix.  Patient is on oxygen at home, 50 feet and keeps at 2.5 liters, states his breathing is better now he is here on 2 liters. Alert and oriented x 4, last bp 98/60 per EMS, blood sugar 261 per EMS, no hx of diabetes.

## 2022-06-03 NOTE — Telephone Encounter (Signed)
Patients wife called in about patient showing signs of a GI bleed and have difficulty breathing. Talked with Fritz Pickerel, Dr.Mohamed and Sandi RN about this patient. Dr.Mohamed instructed that the patient cancel today's appointments and head to the ER. Patients wife was notified of this.

## 2022-06-03 NOTE — Plan of Care (Signed)
?  Problem: Nutrition: ?Goal: Adequate nutrition will be maintained ?Outcome: Progressing ?  ?Problem: Elimination: ?Goal: Will not experience complications related to bowel motility ?Outcome: Progressing ?Goal: Will not experience complications related to urinary retention ?Outcome: Progressing ?  ?Problem: Pain Managment: ?Goal: General experience of comfort will improve ?Outcome: Progressing ?  ?  ?

## 2022-06-03 NOTE — ED Notes (Signed)
During Ortho VS, pt states they felt fine during the laying and sitting portion. As for the standing portion, pt started to feel slightly weak and SHOB. As the pt continued to stand the weakness and SHOB increased and the pts SpO2 went from 96 to 88 on a good pleth.

## 2022-06-03 NOTE — Progress Notes (Signed)
Patient  admitted from ED to 1434, patient alert/oriented, PRBC already infusing, started in the ED. Patient oriented to room/unit.

## 2022-06-03 NOTE — ED Provider Notes (Signed)
[facility] DEPT Provider Note   CSN: [id number] Arrival date & time: 06/05/2022  1155     History  No chief complaint on file.   Benjamin Case is a 66 y.o. male.  HPI  patient presents with fatigue and black tarry stools.  Some hypotension.  History of lung cancer due to start new chemotherapy today.  Last seen in the today.  States has had black stool. no blood in the stool.   black stool began around Christmas. feels more fatigued.  Has shortness of breath at baseline but states has been more short of breath. is on Plavix. previous history of GI bleeds.   Past Medical History:  Diagnosis Date   Arthritis    back   COPD (chronic obstructive pulmonary disease) ([place])    Diabetes mellitus without complication (HCC)    no medications , monitors cbg at home with monitor, avg no more than 150s at home    Dyspnea    increased exertion; pt states can climb flight of stairs w/o having to stop prior to reaching top ; ongoing reports it is due to "having one lung"    GERD (gastroesophageal reflux disease)    Hepatitis    hepatitis C; pt states competed treatments, unsure what medication he was treated with    History of chemotherapy    Hypertension    reports its been over a year since he stopped taking bp medication, reports he made her medical doctor aware that he was topping it    lung ca dx'd 12/2007   lt lobectomy   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   and back - see note in [facility] 12/26/21   Numbness    FEET BILATERAL   Pneumonia 2009   Right upper lobe pulmonary nodule 04/29/2021   Rotator cuff tear    right     Home Medications Prior to Admission medications   Medication Sig Start Date End Date Taking? Authorizing Provider  acetaminophen (TYLENOL) 650 MG CR tablet Take 1,300 mg by mouth every 8 (eight) hours as needed for pain.    [provider]  ALPRAZolam Duanne Moron) 1 MG tablet Take 1 mg by mouth 3 (three) times daily as needed for  anxiety. 04/20/15   [provider]  Ascorbic Acid (VITAMIN C) 1000 MG tablet Take 1,000 mg by mouth daily.    [provider]  aspirin EC 81 MG tablet Take 81 mg by mouth daily.    [provider]  atorvastatin (LIPITOR) 10 MG tablet Take 10 mg by mouth at bedtime.     [provider]  B Complex-C (SUPER B COMPLEX PO) Take 1 tablet by mouth daily.    [provider]  clopidogrel (PLAVIX) 75 MG tablet Take 1 tablet (75 mg total) by mouth daily. 05/14/22   Thurnell Lose, MD  diltiazem (CARDIZEM SR) 60 MG 12 hr capsule Take 60 mg by mouth 2 (two) times daily.    [provider]  esomeprazole (NEXIUM) 40 MG capsule Take 40 mg by mouth daily as needed (for heartburn).     [provider]  folic acid (FOLVITE) 1 MG tablet Take 1 tablet (1 mg total) by mouth daily. 04/18/22   Shawna Clamp, MD  furosemide (LASIX) 40 MG tablet Take 1 tablet (40 mg total) by mouth daily. 05/16/22 06/15/22  Thurnell Lose, MD  guaiFENesin (MUCINEX) 600 MG 12 hr tablet Take 600 mg by mouth 2 (two) times daily.  [provider]  ipratropium-albuterol (DUONEB) 0.5-2.5 (3) MG/3ML SOLN Take 3 mLs by nebulization 2 (two) times daily.    [provider]  lidocaine-prilocaine (EMLA) cream Apply 1 Application topically as needed. Patient not taking: Reported on 05/10/2022 05/06/22   Heilingoetter, Cassandra L, PA-C  metoprolol succinate (TOPROL XL) 25 MG 24 hr tablet Take 1 tablet (25 mg total) by mouth daily. 04/27/22   Loel Dubonnet, NP  Omega-3 Fatty Acids ([facility] FISH OIL) 1000 MG CAPS Take 1,000 mg by mouth every evening.    [provider]  oxyCODONE-acetaminophen (PERCOCET) 10-325 MG tablet Take 1 tablet by mouth 5 (five) times daily as needed for pain. 05/04/21   [provider]  potassium chloride (KLOR-CON M) 10 MEQ tablet Take 1 tablet (10 mEq total) by mouth daily. 05/16/22   Thurnell Lose, MD  pregabalin  (LYRICA) 75 MG capsule Take 75 mg by mouth 2 (two) times daily. 02/22/22   [provider]  thiamine (VITAMIN B-1) 100 MG tablet Take 1 tablet (100 mg total) by mouth daily. 04/18/22   Shawna Clamp, MD  vitamin B-12 (CYANOCOBALAMIN) 1000 MCG tablet Take 1,000 mcg by mouth daily.    [provider]      Allergies    Patient has no known allergies.    Review of Systems   Review of Systems  Physical Exam Updated Vital Signs BP (!) 93/55   Pulse (!) 101   Temp 98.4 F (36.9 C)   Resp 16   Ht 5\' 10"  (1.778 m)   Wt 76.6 kg   SpO2 100%   BMI 24.22 kg/m  Physical Exam Vitals reviewed.  Eyes:     Pupils: Pupils are equal, round, and reactive to light.  Cardiovascular:     Rate and Rhythm: Regular rhythm.  Pulmonary:     Comments: Some tachypnea. Abdominal:     Tenderness: There is no abdominal tenderness.  Musculoskeletal:        General: No tenderness.  Skin:    General: Skin is warm.     Capillary Refill: Capillary refill takes less than 2 seconds.  Neurological:     Mental Status: He is alert and oriented to person, place, and time.     ED Results / Procedures / Treatments   Labs (all labs ordered are listed, but only abnormal results are displayed) Labs Reviewed  COMPREHENSIVE METABOLIC PANEL - Abnormal; Notable for the following components:      Result Value   Glucose, Bld 144 (*)    BUN 41 (*)    Creatinine, Ser 1.64 (*)    Calcium 8.1 (*)    Albumin 3.2 (*)    GFR, Estimated 45 (*)    All other components within normal limits  CBC WITH DIFFERENTIAL/PLATELET - Abnormal; Notable for the following components:   WBC 20.3 (*)    RBC 1.71 (*)    Hemoglobin 5.8 (*)    HCT 18.2 (*)    MCV 106.4 (*)    RDW 16.7 (*)    Platelets 542 (*)    Neutro Abs 17.0 (*)    Monocytes Absolute 1.5 (*)    Abs Immature Granulocytes 0.17 (*)    All other components within normal limits  POC OCCULT BLOOD, ED - Abnormal; Notable for the following components:    Fecal Occult Bld POSITIVE (*)    All other components within normal limits  PROTIME-INR  TYPE AND SCREEN  PREPARE RBC (CROSSMATCH)    EKG  None  Radiology No results found.  Procedures Procedures    Medications Ordered in ED Medications  sodium chloride 0.9 % bolus 1,000 mL (0 mLs Intravenous Stopped 05/22/2022 1416)    And  0.9 %  sodium chloride infusion ( Intravenous New Bag/Given 05/30/2022 1416)  pantoprozole (PROTONIX) 80 mg /NS 100 mL infusion (has no administration in time range)  0.9 %  sodium chloride infusion (Manually program via Guardrails IV Fluids) (has no administration in time range)  pantoprazole (PROTONIX) 80 mg /NS 100 mL IVPB (80 mg Intravenous New Bag/Given 05/18/2022 1345)    ED Course/ Medical Decision Making/ A&P                           Medical Decision Making Amount and/or Complexity of Data Reviewed Labs: ordered.  Risk Prescription drug management. Decision regarding hospitalization.   Patient with lightheadedness dizziness black stool.  History of previous GI bleeds.  Reportedly has seen [facility].  Is on Plavix.  Black stool for last couple days.  High suspicion for GI bleed.  With hypotension and tachycardia worried that could be significant.  Will give fluid bolus and empirically started on Protonix.  Will check Hemoccult.  Reviewed oncology note from yesterday and reviewed chart looking for GI involvement.  Patient does have hemoglobin of 5.8.  Down from baseline around 10.  White count is elevated and platelets are also elevated.  Mild hypotension.  Will transfuse 2 units at this time.  Will discuss with [facility].  And will admit to internal medicine.  CRITICAL CARE Performed by: Davonna Belling Total critical care time: 30  minutes Critical care time was exclusive of separately billable procedures and treating other patients. Critical care was necessary to treat or prevent imminent or life-threatening deterioration. Critical care was  time spent personally by me on the following activities: development of treatment plan with patient and/or surrogate as well as nursing, discussions with consultants, evaluation of patient's response to treatment, examination of patient, obtaining history from patient or surrogate, ordering and performing treatments and interventions, ordering and review of laboratory studies, ordering and review of radiographic studies, pulse oximetry and re-evaluation of patient's condition.         Final Clinical Impression(s) / ED Diagnoses Final diagnoses:  Gastrointestinal hemorrhage, unspecified gastrointestinal hemorrhage type  Acute blood loss anemia    Rx / DC Orders ED Discharge Orders     None         Davonna Belling, MD 05/24/2022 1418

## 2022-06-03 NOTE — Telephone Encounter (Signed)
[name] spoke with patient by phone 06/02/22 and responded to patient's questions in mychart message 05/27/22.

## 2022-06-03 NOTE — Progress Notes (Addendum)
       CROSS COVER NOTE  NAME: Benjamin Case MRN: [id number] DOB : February 10, 1956   Cross coverage   Notified by bedside RN the patient wishes to clarify CODE STATUS.    At bedside, patient is alert and oriented x 4.  Wife is at also at bedside. Based on various questions asked, patient is clear about his CODE STATUS wishes.    In case of an emergency where the patient is unable to breathe on his own, he does not wish to be intubated or mechanically ventilated (DNI).  However, if he were to become pulseless, the patient would like to be receive CPR, DC shock, ACLS drugs, IV medications/fluids, and/or cardiac monitoring as indicated.   CODE STATUS changed to full code, with DO NOT INTUBATE (DNI) or mechanically ventilated.  Nursing staff has been verbally notified of this change.  Raenette Rover, DNP, [place]

## 2022-06-03 NOTE — Progress Notes (Signed)
Patient C/O increase SOB, already on 3L, lung diminished,  increased oxygen to 4L without any change, on call provider A. Chavez-NP notified, on the way to assess patient, breathing treatment given and effective per patient. Reported off to night shift nurse to F/U with plan of care.

## 2022-06-04 ENCOUNTER — Inpatient Hospital Stay (HOSPITAL_COMMUNITY): Payer: Medicare Other

## 2022-06-04 ENCOUNTER — Other Ambulatory Visit: Payer: Self-pay

## 2022-06-04 ENCOUNTER — Other Ambulatory Visit: Payer: Self-pay | Admitting: Gastroenterology

## 2022-06-04 DIAGNOSIS — C3411 Malignant neoplasm of upper lobe, right bronchus or lung: Secondary | ICD-10-CM | POA: Diagnosis not present

## 2022-06-04 DIAGNOSIS — K922 Gastrointestinal hemorrhage, unspecified: Secondary | ICD-10-CM | POA: Diagnosis not present

## 2022-06-04 LAB — TYPE AND SCREEN
ABO/RH(D): O POS
Antibody Screen: NEGATIVE
Unit division: 0
Unit division: 0

## 2022-06-04 LAB — CBC
HCT: 27.3 % — ABNORMAL LOW (ref 39.0–52.0)
Hemoglobin: 8.9 g/dL — ABNORMAL LOW (ref 13.0–17.0)
MCH: 31.9 pg (ref 26.0–34.0)
MCHC: 32.6 g/dL (ref 30.0–36.0)
MCV: 97.8 fL (ref 80.0–100.0)
Platelets: 494 K/uL — ABNORMAL HIGH (ref 150–400)
RBC: 2.79 MIL/uL — ABNORMAL LOW (ref 4.22–5.81)
RDW: 18.2 % — ABNORMAL HIGH (ref 11.5–15.5)
WBC: 19.5 K/uL — ABNORMAL HIGH (ref 4.0–10.5)
nRBC: 0.2 % (ref 0.0–0.2)

## 2022-06-04 LAB — BPAM RBC
Blood Product Expiration Date: 202401262359
Blood Product Expiration Date: 202401262359
ISSUE DATE / TIME: 202312281508
ISSUE DATE / TIME: 202312281905
Unit Type and Rh: 5100
Unit Type and Rh: 5100

## 2022-06-04 LAB — COMPREHENSIVE METABOLIC PANEL
ALT: 28 U/L (ref 0–44)
AST: 29 U/L (ref 15–41)
Albumin: 3.1 g/dL — ABNORMAL LOW (ref 3.5–5.0)
Alkaline Phosphatase: 63 U/L (ref 38–126)
Anion gap: 9 (ref 5–15)
BUN: 26 mg/dL — ABNORMAL HIGH (ref 8–23)
CO2: 28 mmol/L (ref 22–32)
Calcium: 7.9 mg/dL — ABNORMAL LOW (ref 8.9–10.3)
Chloride: 99 mmol/L (ref 98–111)
Creatinine, Ser: 1.26 mg/dL — ABNORMAL HIGH (ref 0.61–1.24)
GFR, Estimated: >60 mL/min (ref >60)
Glucose, Bld: 118 mg/dL — ABNORMAL HIGH (ref 70–99)
Potassium: 3.7 mmol/L (ref 3.5–5.1)
Sodium: 136 mmol/L (ref 135–145)
Total Bilirubin: 0.6 mg/dL (ref 0.3–1.2)
Total Protein: 6 g/dL — ABNORMAL LOW (ref 6.5–8.1)

## 2022-06-04 LAB — MAGNESIUM: Magnesium: 1.2 mg/dL — ABNORMAL LOW (ref 1.7–2.4)

## 2022-06-04 MED ORDER — OXYCODONE HCL 5 MG PO TABS
5.0000 mg | ORAL_TABLET | Freq: Every day | ORAL | Status: DC | PRN
  Administered 2022-06-04 – 2022-06-07 (×10): 5 mg via ORAL
  Filled 2022-06-04 (×10): qty 1

## 2022-06-04 MED ORDER — VITAMIN B-12 1000 MCG PO TABS
1000.0000 ug | ORAL_TABLET | Freq: Every day | ORAL | Status: DC
  Administered 2022-06-04 – 2022-06-06 (×3): 1000 ug via ORAL
  Filled 2022-06-04 (×3): qty 1

## 2022-06-04 MED ORDER — FOLIC ACID 1 MG PO TABS
1.0000 mg | ORAL_TABLET | Freq: Every day | ORAL | Status: DC
  Administered 2022-06-04 – 2022-06-06 (×3): 1 mg via ORAL
  Filled 2022-06-04 (×3): qty 1

## 2022-06-04 MED ORDER — OMEGA-3-ACID ETHYL ESTERS 1 G PO CAPS
1.0000 g | ORAL_CAPSULE | Freq: Every evening | ORAL | Status: DC
  Administered 2022-06-04 – 2022-06-06 (×3): 1 g via ORAL
  Filled 2022-06-04 (×4): qty 1

## 2022-06-04 MED ORDER — CVS FISH OIL 1000 MG PO CAPS: 1000.0000 mg | ORAL_CAPSULE | Freq: Every evening | ORAL | Status: DC

## 2022-06-04 MED ORDER — MAGNESIUM SULFATE 4 GM/100ML IV SOLN
4.0000 g | Freq: Once | INTRAVENOUS | Status: AC
  Administered 2022-06-04: 4 g via INTRAVENOUS
  Filled 2022-06-04: qty 100

## 2022-06-04 MED ORDER — ALPRAZOLAM 0.5 MG PO TABS
1.0000 mg | ORAL_TABLET | Freq: Every day | ORAL | Status: DC | PRN
  Administered 2022-06-04 – 2022-06-05 (×2): 1 mg via ORAL
  Filled 2022-06-04 (×3): qty 2

## 2022-06-04 MED ORDER — PANTOPRAZOLE SODIUM 40 MG PO TBEC
40.0000 mg | DELAYED_RELEASE_TABLET | Freq: Two times a day (BID) | ORAL | Status: DC
  Administered 2022-06-04 – 2022-06-06 (×5): 40 mg via ORAL
  Filled 2022-06-04 (×5): qty 1

## 2022-06-04 MED ORDER — PREGABALIN 75 MG PO CAPS
75.0000 mg | ORAL_CAPSULE | Freq: Two times a day (BID) | ORAL | Status: DC
  Administered 2022-06-04 – 2022-06-06 (×5): 75 mg via ORAL
  Filled 2022-06-04 (×6): qty 1

## 2022-06-04 MED ORDER — OXYCODONE-ACETAMINOPHEN 10-325 MG PO TABS: 1.0000 | ORAL_TABLET | Freq: Every day | ORAL | Status: DC | PRN

## 2022-06-04 MED ORDER — GUAIFENESIN ER 600 MG PO TB12
600.0000 mg | ORAL_TABLET | Freq: Two times a day (BID) | ORAL | Status: DC
  Administered 2022-06-04 – 2022-06-06 (×6): 600 mg via ORAL
  Filled 2022-06-04 (×6): qty 1

## 2022-06-04 MED ORDER — ATORVASTATIN CALCIUM 10 MG PO TABS
10.0000 mg | ORAL_TABLET | Freq: Every day | ORAL | Status: DC
  Administered 2022-06-04 – 2022-06-06 (×3): 10 mg via ORAL
  Filled 2022-06-04 (×3): qty 1

## 2022-06-04 MED ORDER — ALPRAZOLAM 0.5 MG PO TABS: 1.0000 mg | ORAL_TABLET | Freq: Three times a day (TID) | ORAL | Status: DC | PRN

## 2022-06-04 MED ORDER — OXYCODONE-ACETAMINOPHEN 5-325 MG PO TABS
1.0000 | ORAL_TABLET | Freq: Every day | ORAL | Status: DC | PRN
  Administered 2022-06-04 – 2022-06-07 (×11): 1 via ORAL
  Filled 2022-06-04 (×11): qty 1

## 2022-06-04 MED ORDER — DILTIAZEM HCL-DEXTROSE 125-5 MG/125ML-% IV SOLN (PREMIX)
5.0000 mg/h | INTRAVENOUS | Status: DC
  Administered 2022-06-04: 5 mg/h via INTRAVENOUS
  Filled 2022-06-04 (×2): qty 125

## 2022-06-04 MED ORDER — THIAMINE MONONITRATE 100 MG PO TABS
100.0000 mg | ORAL_TABLET | Freq: Every day | ORAL | Status: DC
  Administered 2022-06-04 – 2022-06-06 (×3): 100 mg via ORAL
  Filled 2022-06-04 (×3): qty 1

## 2022-06-04 MED ORDER — FUROSEMIDE 10 MG/ML IJ SOLN
40.0000 mg | Freq: Once | INTRAMUSCULAR | Status: AC
  Administered 2022-06-04: 40 mg via INTRAVENOUS
  Filled 2022-06-04: qty 4

## 2022-06-04 MED ORDER — METOPROLOL SUCCINATE ER 25 MG PO TB24
25.0000 mg | ORAL_TABLET | Freq: Every day | ORAL | Status: DC
  Administered 2022-06-04 – 2022-06-05 (×2): 25 mg via ORAL
  Filled 2022-06-04 (×2): qty 1

## 2022-06-04 MED ORDER — DILTIAZEM HCL 25 MG/5ML IV SOLN
15.0000 mg | INTRAVENOUS | Status: AC
  Administered 2022-06-04: 15 mg via INTRAVENOUS
  Filled 2022-06-04: qty 5

## 2022-06-04 MED ORDER — FUROSEMIDE 10 MG/ML IJ SOLN
40.0000 mg | Freq: Two times a day (BID) | INTRAMUSCULAR | Status: DC
  Administered 2022-06-04 – 2022-06-06 (×4): 40 mg via INTRAVENOUS
  Filled 2022-06-04 (×4): qty 4

## 2022-06-04 MED ORDER — LEVALBUTEROL HCL 0.63 MG/3ML IN NEBU
0.6300 mg | INHALATION_SOLUTION | Freq: Four times a day (QID) | RESPIRATORY_TRACT | Status: AC | PRN
  Administered 2022-06-04 (×2): 0.63 mg via RESPIRATORY_TRACT
  Filled 2022-06-04 (×2): qty 3

## 2022-06-04 MED ORDER — DILTIAZEM LOAD VIA INFUSION
10.0000 mg | Freq: Once | INTRAVENOUS | Status: AC
  Administered 2022-06-04: 10 mg via INTRAVENOUS
  Filled 2022-06-04: qty 10

## 2022-06-04 MED ORDER — IPRATROPIUM-ALBUTEROL 0.5-2.5 (3) MG/3ML IN SOLN
3.0000 mL | Freq: Two times a day (BID) | RESPIRATORY_TRACT | Status: DC
  Administered 2022-06-04 – 2022-06-06 (×5): 3 mL via RESPIRATORY_TRACT
  Filled 2022-06-04 (×5): qty 3

## 2022-06-04 MED ORDER — OXYCODONE-ACETAMINOPHEN 5-325 MG PO TABS
2.0000 | ORAL_TABLET | Freq: Four times a day (QID) | ORAL | Status: AC | PRN
  Administered 2022-06-04: 2 via ORAL
  Filled 2022-06-04: qty 2

## 2022-06-04 MED ORDER — VITAMIN C 500 MG PO TABS
1000.0000 mg | ORAL_TABLET | Freq: Every day | ORAL | Status: DC
  Administered 2022-06-04 – 2022-06-06 (×3): 1000 mg via ORAL
  Filled 2022-06-04 (×3): qty 2

## 2022-06-04 MED ORDER — METOPROLOL SUCCINATE ER 25 MG PO TB24: 25.0000 mg | ORAL_TABLET | Freq: Every day | ORAL | Status: DC

## 2022-06-04 MED ORDER — ALPRAZOLAM 0.5 MG PO TABS
2.0000 mg | ORAL_TABLET | Freq: Every evening | ORAL | Status: AC | PRN
  Administered 2022-06-05: 1 mg via ORAL
  Filled 2022-06-04 (×2): qty 4

## 2022-06-04 NOTE — Progress Notes (Signed)
[facility] Progress Note  Benjamin Case 66 y.o. [birth year]  CC:  GI bleed   Subjective: Patient states his last bowel movement was yesterday. BM was black, soft and formed. No further bowel movements. Denies abdominal pain, nausea, vomiting. Patient is not interested in increased diet at this time as he has no appetite.  ROS : Review of Systems  Constitutional:  Negative for chills, fever and weight loss.  Gastrointestinal:  Positive for melena. Negative for abdominal pain, blood in stool, constipation, diarrhea, heartburn, nausea and vomiting.      Objective: Vital signs in last 24 hours: Vitals:   06/04/22 0645 06/04/22 0922  BP:    Pulse:    Resp:    Temp:  97.9 F (36.6 C)  SpO2: 93%     Physical Exam:  General:  Alert, cooperative, no distress, ill appearing  Head:  Normocephalic, without obvious abnormality, atraumatic  Eyes:  Anicteric sclera, EOM's intact  Lungs:   respirations labored  Heart:  Regular rate and rhythm, S1, S2 normal  Abdomen:   Soft, non-tender, bowel sounds active all four quadrants,  no masses,   Extremities: Extremities normal, atraumatic, no  edema  Pulses: 2+ and symmetric    Lab Results: Recent Labs    05/23/2022 1315 06/04/22 0104  NA 137 136  K 4.0 3.7  CL 98 99  CO2 30 28  GLUCOSE 144* 118*  BUN 41* 26*  CREATININE 1.64* 1.26*  CALCIUM 8.1* 7.9*  MG  --  1.2*   Recent Labs    05/14/2022 1315 06/04/22 0104  AST 32 29  ALT 34 28  ALKPHOS 65 63  BILITOT 0.6 0.6  PROT 6.6 6.0*  ALBUMIN 3.2* 3.1*   Recent Labs    05/15/2022 1315 06/04/22 0104  WBC 20.3* 19.5*  NEUTROABS 17.0*  --   HGB 5.8* 8.9*  HCT 18.2* 27.3*  MCV 106.4* 97.8  PLT 542* 494*   Recent Labs    05/22/2022 1315  LABPROT 14.0  INR 1.1      Assessment GI bleed - 8.9 (5.8) - BUN 26 (41), cr. 1.26   Plan: Continue to hold blood thinner. Patient would prefer to not stay in hospital and would like to go home. Will do EGD as an  outpatient at hospital Tuesday Jan 2nd after being off blood thinner for 3-5 days. Continue PPI BID [facility] will sign off. Please contact us if we can be of any further assistance during this hospital stay.   Garnette Scheuermann PA-C 06/04/2022, 11:09 AM  Contact #  [phone]

## 2022-06-04 NOTE — Progress Notes (Addendum)
       CROSS COVER NOTE  NAME: Benjamin Case MRN: [id number] DOB : 23-Feb-1956    Date of Service   06/04/2022   HPI/Events of Note   0150- notified by bedside RN of respiratory change, patient is currently tachypneic with expiratory wheezing and is complaining of SOB.   Patient is currently receiving DuoNeb treatment.   X-ray has also been ordered.   IVF on hold.  While getting vital signs, RN noticed HR 120s to 140s.  Patient denies chest pain, palpitations, nausea, vomiting, abdominal pain.  Poor quality EKG obtained, but underlying cardiac rhythm is atrial fibrillation with RVR.  Pt has hx of PAF. Based on cardiac monitoring data, patient appears to have been tachycardic for the past hour.  Current potassium 3.7.  Patient was given 15 mg IV Cardizem.   Magnesium level ordered.  Some HR movement noted after Cardizem, HR  ~100-120.  Improvement also noted in respiratory rate.  0230-patient sustaining HR~ 120-140's.  Patient denies chest pain, palpitations, and shortness of breath.   Cardizem bolus and infusion ordered. Discontinue DuoNeb, Xopenex ordered PRN  0400-RN reports critical lab, magnesium 1.2.  IV magnesium ordered    Interventions/ Plan   Above       Raenette Rover, DNP, [place]

## 2022-06-04 NOTE — TOC Initial Note (Signed)
Transition of Care [facility] - Initial/Assessment Note    Patient Details  Name: Benjamin Case MRN: [id number] Date of Birth: 16-Mar-1956  Transition of Care [facility] CM/SW Contact:    Leeroy Cha, RN Phone Number: 06/04/2022, 7:52 AM  Clinical Narrative:                 Smoking cessation information and etoh effects on the body added to the dc instructions  Expected Discharge Plan: Home/Self Care Barriers to Discharge: Continued Medical Work up   Patient Goals and CMS Choice Patient states their goals for this hospitalization and ongoing recovery are:: to return to home CMS Medicare.gov Compare Post Acute Care list provided to:: Patient        Expected Discharge Plan and Services   Discharge Planning Services: CM Consult   Living arrangements for the past 2 months: Single Family Home                                      Prior Living Arrangements/Services Living arrangements for the past 2 months: Single Family Home Lives with:: Spouse Patient language and need for interpreter reviewed:: Yes Do you feel safe going back to the place where you live?: Yes            Criminal Activity/Legal Involvement Pertinent to Current Situation/Hospitalization: No - Comment as needed  Activities of Daily Living Home Assistive Devices/Equipment: None ADL Screening (condition at time of admission) Patient's cognitive ability adequate to safely complete daily activities?: Yes Is the patient deaf or have difficulty hearing?: No Does the patient have difficulty seeing, even when wearing glasses/contacts?: No Does the patient have difficulty concentrating, remembering, or making decisions?: No Patient able to express need for assistance with ADLs?: Yes Does the patient have difficulty dressing or bathing?: No Independently performs ADLs?: Yes (appropriate for developmental age) Does the patient have difficulty walking or climbing stairs?: Yes Weakness of Legs: Both Weakness  of Arms/Hands: None  Permission Sought/Granted                  Emotional Assessment Appearance:: Appears stated age Attitude/Demeanor/Rapport: Engaged Affect (typically observed): Calm Orientation: : Oriented to Self, Oriented to Place, Oriented to  Time Alcohol / Substance Use: Tobacco Use, Alcohol Use (current smoker and occasional etoh intake) Psych Involvement: No (comment)  Admission diagnosis:  Acute blood loss anemia [D62] GIB (gastrointestinal bleeding) [K92.2] Gastrointestinal hemorrhage, unspecified gastrointestinal hemorrhage type [K92.2] Patient Active Problem List   Diagnosis Date Noted   GIB (gastrointestinal bleeding) 05/16/2022   Chronic hypoxic respiratory failure ([place]) 06/02/2022   AKI (acute kidney injury) ([place]) 05/09/2022   Chronic diastolic CHF (congestive heart failure) ([place]) 06/02/2022   HLD (hyperlipidemia) 05/09/2022   Leukocytosis 05/10/2022   GERD (gastroesophageal reflux disease) 06/02/2022   Anxiety 05/31/2022   Hypocalcemia 05/20/2022   Goals of care, counseling/discussion 05/27/2022   Stroke (cerebrum) ([place]) 05/10/2022   Shortness of breath 05/10/2022   Acute on chronic respiratory failure with hypoxia ([place]) 05/10/2022   Persistent atrial fibrillation ([place]) 05/07/2022   History of embolic stroke 49/17/9150   History of GI bleed 05/07/2022   Dyspnea 04/15/2022   Hypoxia 04/15/2022   Paroxysmal atrial fibrillation ([place]) 04/15/2022   Pneumonia 04/11/2022   Metastatic primary lung cancer, right ([place]) 04/11/2022   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[place]) 04/01/2022   Adenopathy    Right upper lobe  pulmonary nodule 02/22/2022   Hx of cancer of lung 02/22/2022   Lung nodules 05/11/2021   S/P arthroscopy of right shoulder 12/14/2018   Spinal stenosis, lumbar region with neurogenic claudication 03/24/2016   COPD GOLD III  03/14/2016   Cigarette smoker 03/14/2016   Status post lobectomy of lung 03/12/2016   BACK  PAIN, LUMBAR, CHRONIC 02/14/2009   PERIPHERAL NEUROPATHY 10/24/2008   COLONIC POLYPS, HX OF 10/24/2008   Cancer of left lung parenchyma ([place]) 11/27/2007   HEMOPTYSIS 11/20/2007   Nonspecific (abnormal) findings on radiological and other examination of body structure 11/20/2007   CT, CHEST, ABNORMAL 11/20/2007   PCP:  [name], MD Pharmacy:   [id number] [place], [place] - [address] [place] [address] Phone: [phone] Fax: [phone]  [facility] [address] [place] [address] Phone: [phone] Fax: [phone]     Social Determinants of Health (SDOH) Social History: [place]: No Food Insecurity (06/05/2022)  Housing: Low Risk  (06/02/2022)  Transportation Needs: No Transportation Needs (05/26/2022)  Utilities: Not At Risk (05/07/2022)  Tobacco Use: High Risk (05/21/2022)   SDOH Interventions:     Readmission Risk Interventions   Row Labels 05/13/2022   11:58 AM 04/17/2022   11:29 AM  Readmission Risk Prevention Plan   Section Header. No data exists in this row.    Transportation Screening   Complete Complete  PCP or Specialist Appt within 3-5 Days    Complete  HRI or Home Care Consult   Complete Complete  Social Work Consult for [place] Planning/Counseling   Complete Complete  Palliative Care Screening   Not Applicable Not Applicable  Medication Review Press photographer)   Referral to Pharmacy Complete

## 2022-06-04 NOTE — Progress Notes (Signed)
Patient sat in chair for about 15 min then requested return to bed. Once in bed, SpO2 dropped to 70% on 4 L HFNC, HFNC increased to 6.  SpO2 returned to 96% after about 3 min.  Breathing treatment administered per patient request.  MD aware.  Angie Fava, RN

## 2022-06-04 NOTE — Progress Notes (Signed)
Mobility Specialist - Progress Note  Pre-mobility: 87 bpm HR, 94% SpO2 During mobility: 114 bpm HR, 70% SpO2 Post-mobility: 95 bpm HR, 95% SPO2  06/04/22 1500  Oxygen Therapy  O2 Device HFNC  O2 Flow Rate (L/min) 4 L/min  Mobility  Activity Transferred from bed to chair  Level of Assistance Contact guard assist, steadying assist  Assistive Device Front wheel walker  Activity Response Tolerated fair  $Mobility charge 1 Mobility   Pt was found in bed and agreeable to try ambulation or at least sitting up in the chair. Pt SPO2 dropped to 70% sitting EOB and brought up SPO2 up to 90% within ~ 3 min. Afterwards pt was able to transfer to recliner chair with chair alarm on and family in room.  Ferd Hibbs Mobility Specialist

## 2022-06-04 NOTE — Progress Notes (Signed)
At [place] Patient reported having trouble breathing. Respiratory called to give breathing treatment. RN arrived to bedside to assess patient. Patient using accessory muscles to breathe and wheezing. Patient oxygen sats 93 % on 5 liters nasal cannula. RN notified on call provider at 0119. Received instruction to stop fluids per provider. While receiving breathing treatment RN noticed heart rhythm  A-fib with RVR with HR ranging from 120-140's. Provider notified of changes. EKG and vitals obtained. New orders placed. Will continue to monitor patient closely.

## 2022-06-04 NOTE — Progress Notes (Signed)
Patient complaining of persistent difficulty breathing since 0700.  Lasix administered at 0749 per MD order, 600 mL output.  Per patient, no relief from difficulty breathing.  SpO2 98% on 6 L HFNC, RR 24, other VS WDL.  C/o 9/10 chronic pain in lower back, requesting to be started on home pain medicine regimen.  Current PRN pain med not available until 1215.  MD notified.  Angie Fava, RN

## 2022-06-04 NOTE — Progress Notes (Signed)
DIAGNOSIS: Stage IV (T1a, N2, M1 C) non-small cell lung cancer, squamous cell carcinoma presented with right upper lobe lung nodule in addition to right hilar and mediastinal lymphadenopathy and metastatic disease to several bone areas as well as musculoskeletal muscles diagnosed in October 2023. The patient also has a history of a stage IIIa non-small cell lung cancer, squamous cell carcinoma diagnosed in June 2009    Detected Alteration(s) / Biomarker(s)         Associated FDA-approved therapies Clinical Trial Availability          % cfDNA or Amplification KRAS G12C approved by FDA Adagrasib, Sotorasib Yes    6.8%   TP53 S241C None Yes          0.9%   TP53 [id number] None Yes          0.5%   PRIOR THERAPY: 1) status post left pneumonectomy in 2009 2) 4 cycles of adjuvant chemotherapy completed in April 29, 2008  3) Palliative systemic chemotherapy with carboplatin for an AUC of 5, paclitaxel 175 mg per metered square, and converted to 100 mg IV every 3 weeks with Neulasta support.  Status post 2 cycles  First dose of treatment on 04/08/22.  This treatment was discontinued secondary to disease progression in the liver   CURRENT THERAPY: Second line systemic chemotherapy with gemcitabine 1000 Mg/M2 on days 1 and 8 every 3 weeks.  First dose June 03, 2022.  This is currently on hold because of his recent admission.  Subjective: The patient is seen and examined today.  His wife was at the bedside.  He was supposed to start the first day of second line systemic chemotherapy yesterday but the patient called the office complaining of increasing fatigue and black tarry stool.  He was advised to go to the hospital for further evaluation.  He was admitted with gastrointestinal hemorrhage secondary to Plavix and aspirin.  His hemoglobin on admission was 5.8.  The patient received 2 units of PRBCs transfusion and his hemoglobin today is 8.9.  He was seen by Dr. Watt Climes and he will schedule him for upper  endoscopy on outpatient basis on Tuesday, June 08, 2022.  The patient is feeling little bit better today but he continues to have the baseline shortness of breath and currently on home oxygen.  He has no current nausea or vomiting.  Objective: Vital signs in last 24 hours: Temp:  [97.6 F (36.4 C)-98.4 F (36.9 C)] 97.9 F (36.6 C) (12/29 0922) Pulse Rate:  [88-124] 93 (12/29 0600) Resp:  [13-32] 17 (12/29 0600) BP: (85-135)/(47-90) 115/65 (12/29 0600) SpO2:  [88 %-100 %] 93 % (12/29 0645) Weight:  [165 lb 5.5 oz (75 kg)-168 lb 12.8 oz (76.6 kg)] 167 lb 15.9 oz (76.2 kg) (12/29 0430)  Intake/Output from previous day: 12/28 0701 - 12/29 0700 In: 2057.7 [P.O.:240; I.V.:126.3; Blood:709; IV Piggyback:982.4] Out: 900 [Urine:900] Intake/Output this shift: Total I/O In: -  Out: 1000 [Urine:1000]  General appearance: alert, cooperative, fatigued, and no distress Resp: diminished breath sounds LLL and LUL, dullness to percussion LLL and LUL, and rales RLL Cardio: regular rate and rhythm, S1, S2 normal, no murmur, click, rub or gallop GI: soft, non-tender; bowel sounds normal; no masses,  no organomegaly Extremities: extremities normal, atraumatic, no cyanosis or edema  Lab Results:  Recent Labs    06/02/2022 1315 06/04/22 0104  WBC 20.3* 19.5*  HGB 5.8* 8.9*  HCT 18.2* 27.3*  PLT 542* 494*   BMET Recent Labs  05/16/2022 1315 06/04/22 0104  NA 137 136  K 4.0 3.7  CL 98 99  CO2 30 28  GLUCOSE 144* 118*  BUN 41* 26*  CREATININE 1.64* 1.26*  CALCIUM 8.1* 7.9*    Studies/Results: DG CHEST PORT 1 VIEW  Result Date: 06/04/2022 CLINICAL DATA:  Shortness of breath. EXAM: PORTABLE CHEST 1 VIEW COMPARISON:  Chest radiograph dated 05/20/2022. FINDINGS: Similar appearance of complete opacification of the left hemithorax with volume loss and shift of the mediastinum into the left hemithorax. Diffuse right lung interstitial densities which may represent edema, or pneumonia. No  pneumothorax. Atherosclerotic calcification of the aorta. No acute osseous pathology. IMPRESSION: No significant interval change. Electronically Signed   By: Anner Crete M.D.   On: 06/04/2022 02:28   US RENAL  Result Date: 06/01/2022 CLINICAL DATA:  Acute kidney injury EXAM: RENAL / URINARY TRACT ULTRASOUND COMPLETE COMPARISON:  05/13/2022 FINDINGS: Right Kidney: Renal measurements: 10.1 x 4.4 x 5.4 cm = volume: 125 mL. Echogenicity within normal limits. No mass, shadowing stone, or hydronephrosis visualized. Left Kidney: Renal measurements: 10.8 x 5.7 x 5.2 cm = volume: 167 mL. Echogenicity within normal limits. 2.9 cm simple cyst in the lower pole of the left kidney. No solid mass, shadowing stone, or hydronephrosis visualized. Bladder: Appears normal for degree of bladder distention. Other: Numerous hypoechoic liver masses.  Right-sided pleural effusion. IMPRESSION: 1. No evidence of obstructive uropathy. 2. Numerous hypoechoic liver masses, concerning for metastatic disease. 3. Right-sided pleural effusion. Electronically Signed   By: Davina Poke D.O.   On: 05/13/2022 19:07    Medications: I have reviewed the patient's current medications.   Assessment/Plan: This is a very pleasant 66 years old white male with stage IV non-small cell lung cancer, squamous cell carcinoma presented with right upper lobe nodule in addition to right hilar and mediastinal lymphadenopathy as well as metastatic disease to bones, skeletal muscle and most recently liver.  His molecular studies showed KRAS G12C mutation.  The patient also has history of stage IIIa non-small cell lung cancer diagnosed in June 2009 status post left pneumonectomy followed by adjuvant systemic chemotherapy. For the recently diagnosed right lung cancer he started systemic chemotherapy with carboplatin, paclitaxel and Keytruda but unfortunately this was discontinued secondary to disease progression in the liver.  The patient declined to  proceed with hospice and he was supposed to start second line systemic chemotherapy yesterday but was admitted with gastrointestinal hemorrhage. I will delay the start of his treatment by 1 more week until improvement of his condition. For the gastrointestinal blood loss, his treatment with Plavix and aspirin are currently on hold and the patient is scheduled for endoscopy on outpatient basis next week. I think the patient may benefit from a discussion with the palliative care team regarding goals of care.  His prognosis is not great with his current disease progression and the multiple medical condition including congestive heart failure, arrhythmia as well as recent GI bleed. I will arrange a follow-up appointment for him after discharge from the hospital for more discussion of his treatment options before resuming the second line systemic chemotherapy. Thank you for taking good care with Mr. Godley.  Please call if you have any additional questions.  LOS: 1 day    Eilleen Kempf 06/04/2022

## 2022-06-04 NOTE — Plan of Care (Signed)
  Problem: Clinical Measurements: Goal: Respiratory complications will improve Outcome: Progressing Goal: Cardiovascular complication will be avoided Outcome: Progressing   Problem: Activity: Goal: Risk for activity intolerance will decrease Outcome: Progressing   Problem: Elimination: Goal: Will not experience complications related to bowel motility Outcome: Progressing Goal: Will not experience complications related to urinary retention Outcome: Progressing

## 2022-06-04 NOTE — Progress Notes (Signed)
PROGRESS NOTE   Benjamin Case  PNT:[id number] DOB: [birth year] DOA: 06/06/2022 PCP: [name], MD  Brief Narrative:  66 year old white male stage IV metastatic lung CA L pneumonectomy 2009 adjuvant chemo 2009-started on 3 cycles palliative therapy carboplatin paclitaxel discontinued secondary to progression and then started on gemcitabine 12/28-intent of treatment is palliative Paroxysmal A-fib/Eliquis CHADVASC >4 has bled score >3 prior GI bleed 05/02/2022--- he is supposed to be on DAPT and was discontinued off of anticoagulation because of prior GI bleed and placed on DAPT by [name] Prior CVA Treated hepatitis C Smoker X50 years Peripheral neuropathy Chronic hypoxia on 2 L nasal cannula Paroxysmal A-fib RVR since 04/17/2022 DM TY 2 Chronic EtOH Prior hemilaminectomy microdiscectomy 2007 with spinal stenosis and further surgery 2017 Dr. Gladstone Lighter Several admissions for pneumonia  Seen by Dr. Earlie Server in office 12/27 with intent to start gemcitabine-called office next day with difficulty breathing dark stool low blood pressure  Hemoglobin found to be 5.8 down from 10 transfused 2 units PRBC fluid bolus Protonix started GI Dr. Watt Climes evaluated-recommended clear liquids and holding aspirin Plavix for 3 to 5 days and will scope  Hospital-Problem based course  Acute GI bleed lower -Bleeding seems to have subsided-patient is on DAPT which has been discontinued - Patient does not wish to have scope in hospital so we will graduate diet - He is on Protonix gtt. and we will follow GI bleed with regards to when to discontinue  Anemia blood loss Transfused 2 units PRBC with good result  Stage IV metastatic lung cancer on several lines of chemo supposed to start third line gemcitabine 12/28, smoker X50 years Acute superimposed on chronic respiratory failure on 2 L oxygen baseline - His work of breathing was increased on 12/29 likely secondary to transfusion -Lasix 40 given IV and  will repeat again IV as he is 2 L positive - We will ask for palliative care to follow in the outpatient as patient per Dr. Lew Dawes failing multiple lines of therapy and may need a palliative discussion  Paroxysmal A-fib CHADVASC >3, has bled >3 - DAPT will have to be discontinued - He went into A-fib RVR and was placed on Cardizem drip which can be turned off-I have resumed his metoprolol XL 25 daily we will monitor  DM TY 2 - This appears to be diet controlled he is not on any meds for this-monitor  Lower back pain secondary to prior hemilaminectomy - Careful restart on oxycodone 10/325 resumed gabapentin 75 twice daily - Can continue Xanax 1 mg 3 times daily as needed anxiety    DVT prophylaxis: SCD Code Status: Full Family Communication: Discussed with wife at bedside Disposition:  Status is: Inpatient Remains inpatient appropriate because:  Several metabolic parameters have to resolve in addition to his A-fib    Subjective: Awake coherent slightly short of breath received Lasix and put out some  Still not at baseline Does not wish scope Not really hungry No fever no chills  Objective: Vitals:   06/04/22 0500 06/04/22 0600 06/04/22 0624 06/04/22 0645  BP: 111/62 115/65    Pulse: 91 93    Resp: 15 17    Temp:      TempSrc:      SpO2: 94% 97% 96% 93%  Weight:      Height:        Intake/Output Summary (Last 24 hours) at 06/04/2022 0726 Last data filed at 06/04/2022 0200 Gross per 24 hour  Intake 2057.68 ml  Output 900  ml  Net 1157.68 ml   Filed Weights   05/31/2022 1209 05/12/2022 1815 06/04/22 0430  Weight: 76.6 kg 75 kg 76.2 kg    Examination:  EOMI NCAT no focal deficit no icterus no pallor no rales no rhonchi no wheeze Decreased air entry posterolaterally Abdomen soft no rebound no guarding no tenderness No lower extremity edema ROM intact Neuro intact  Data Reviewed: personally reviewed   CBC    Component Value Date/Time   WBC 19.5 (H)  06/04/2022 0104   RBC 2.79 (L) 06/04/2022 0104   HGB 8.9 (L) 06/04/2022 0104   HGB 10.6 (L) 05/27/2022 0809   HGB 16.4 04/26/2016 1205   HCT 27.3 (L) 06/04/2022 0104   HCT 49.6 04/26/2016 1205   PLT 494 (H) 06/04/2022 0104   PLT 460 (H) 05/27/2022 0809   PLT 346 04/26/2016 1205   MCV 97.8 06/04/2022 0104   MCV 105.2 (H) 04/26/2016 1205   MCH 31.9 06/04/2022 0104   MCHC 32.6 06/04/2022 0104   RDW 18.2 (H) 06/04/2022 0104   RDW 13.1 04/26/2016 1205   LYMPHSABS 1.4 05/18/2022 1315   LYMPHSABS 2.0 04/26/2016 1205   MONOABS 1.5 (H) 05/20/2022 1315   MONOABS 0.9 04/26/2016 1205   EOSABS 0.2 05/16/2022 1315   EOSABS 0.2 04/26/2016 1205   BASOSABS 0.1 05/12/2022 1315   BASOSABS 0.1 04/26/2016 1205      Latest Ref Rng & Units 06/04/2022    1:04 AM 06/02/2022    1:15 PM 05/27/2022    8:09 AM  CMP  Glucose 70 - 99 mg/dL 118  144  120   BUN 8 - 23 mg/dL 26  41  17   Creatinine 0.61 - 1.24 mg/dL 1.26  1.64  1.25   Sodium 135 - 145 mmol/L 136  137  129   Potassium 3.5 - 5.1 mmol/L 3.7  4.0  3.9   Chloride 98 - 111 mmol/L 99  98  88   CO2 22 - 32 mmol/L 28  30  31    Calcium 8.9 - 10.3 mg/dL 7.9  8.1  8.4   Total Protein 6.5 - 8.1 g/dL 6.0  6.6  6.9   Total Bilirubin 0.3 - 1.2 mg/dL 0.6  0.6  0.3   Alkaline Phos 38 - 126 U/L 63  65  96   AST 15 - 41 U/L 29  32  25   ALT 0 - 44 U/L 28  34  14      Radiology Studies: DG CHEST PORT 1 VIEW  Result Date: 06/04/2022 CLINICAL DATA:  Shortness of breath. EXAM: PORTABLE CHEST 1 VIEW COMPARISON:  Chest radiograph dated 05/20/2022. FINDINGS: Similar appearance of complete opacification of the left hemithorax with volume loss and shift of the mediastinum into the left hemithorax. Diffuse right lung interstitial densities which may represent edema, or pneumonia. No pneumothorax. Atherosclerotic calcification of the aorta. No acute osseous pathology. IMPRESSION: No significant interval change. Electronically Signed   By: Anner Crete M.D.    On: 06/04/2022 02:28   US RENAL  Result Date: 06/01/2022 CLINICAL DATA:  Acute kidney injury EXAM: RENAL / URINARY TRACT ULTRASOUND COMPLETE COMPARISON:  05/13/2022 FINDINGS: Right Kidney: Renal measurements: 10.1 x 4.4 x 5.4 cm = volume: 125 mL. Echogenicity within normal limits. No mass, shadowing stone, or hydronephrosis visualized. Left Kidney: Renal measurements: 10.8 x 5.7 x 5.2 cm = volume: 167 mL. Echogenicity within normal limits. 2.9 cm simple cyst in the lower pole of the left kidney. No  solid mass, shadowing stone, or hydronephrosis visualized. Bladder: Appears normal for degree of bladder distention. Other: Numerous hypoechoic liver masses.  Right-sided pleural effusion. IMPRESSION: 1. No evidence of obstructive uropathy. 2. Numerous hypoechoic liver masses, concerning for metastatic disease. 3. Right-sided pleural effusion. Electronically Signed   By: Davina Poke D.O.   On: 05/18/2022 19:07     Scheduled Meds:  vitamin C  1,000 mg Oral Daily   atorvastatin  10 mg Oral QHS   calcium carbonate  1 tablet Oral BID   cyanocobalamin  1,000 mcg Oral Daily   folic acid  1 mg Oral Daily   furosemide  40 mg Intravenous Q12H   guaiFENesin  600 mg Oral BID   ipratropium-albuterol  3 mL Nebulization BID   metoprolol succinate  25 mg Oral Daily   omega-3 acid ethyl esters  1 g Oral QPM   pregabalin  75 mg Oral BID   thiamine  100 mg Oral Daily   Continuous Infusions:  sodium chloride Stopped (06/04/22 0124)   diltiazem (CARDIZEM) infusion 5 mg/hr (06/04/22 0251)   pantoprazole 8 mg/hr (05/19/2022 2212)     LOS: 1 day   Time spent: [place], MD [facility] To contact the attending provider between 7A-7P or the covering provider during after hours 7P-7A, please log into the web site [email or web address] and access using universal [place] password for that web site. If you do not have the password, please call the hospital operator.  06/04/2022, 7:26 AM

## 2022-06-05 DIAGNOSIS — K922 Gastrointestinal hemorrhage, unspecified: Secondary | ICD-10-CM | POA: Diagnosis not present

## 2022-06-05 LAB — CBC WITH DIFFERENTIAL/PLATELET
Abs Immature Granulocytes: 0.09 10*3/uL — ABNORMAL HIGH (ref 0.00–0.07)
Basophils Absolute: 0.1 10*3/uL (ref 0.0–0.1)
Basophils Relative: 1 %
Eosinophils Absolute: 1 10*3/uL — ABNORMAL HIGH (ref 0.0–0.5)
Eosinophils Relative: 6 %
HCT: 29.7 % — ABNORMAL LOW (ref 39.0–52.0)
Hemoglobin: 9.5 g/dL — ABNORMAL LOW (ref 13.0–17.0)
Immature Granulocytes: 1 %
Lymphocytes Relative: 10 %
Lymphs Abs: 1.8 10*3/uL (ref 0.7–4.0)
MCH: 31.5 pg (ref 26.0–34.0)
MCHC: 32 g/dL (ref 30.0–36.0)
MCV: 98.3 fL (ref 80.0–100.0)
Monocytes Absolute: 1.8 10*3/uL — ABNORMAL HIGH (ref 0.1–1.0)
Monocytes Relative: 10 %
Neutro Abs: 13.6 10*3/uL — ABNORMAL HIGH (ref 1.7–7.7)
Neutrophils Relative %: 72 %
Platelets: 477 10*3/uL — ABNORMAL HIGH (ref 150–400)
RBC: 3.02 MIL/uL — ABNORMAL LOW (ref 4.22–5.81)
RDW: 17.4 % — ABNORMAL HIGH (ref 11.5–15.5)
WBC: 18.5 10*3/uL — ABNORMAL HIGH (ref 4.0–10.5)
nRBC: 0 % (ref 0.0–0.2)

## 2022-06-05 MED ORDER — ALPRAZOLAM 1 MG PO TABS
1.0000 mg | ORAL_TABLET | Freq: Three times a day (TID) | ORAL | Status: DC | PRN
  Administered 2022-06-06 – 2022-06-07 (×4): 1 mg via ORAL
  Filled 2022-06-05 (×3): qty 2
  Filled 2022-06-05: qty 1
  Filled 2022-06-05: qty 2

## 2022-06-05 MED ORDER — METOPROLOL TARTRATE 25 MG PO TABS
25.0000 mg | ORAL_TABLET | Freq: Two times a day (BID) | ORAL | Status: DC
  Filled 2022-06-05 (×3): qty 1

## 2022-06-05 MED ORDER — LEVALBUTEROL HCL 0.63 MG/3ML IN NEBU
INHALATION_SOLUTION | RESPIRATORY_TRACT | Status: AC
  Filled 2022-06-05: qty 3

## 2022-06-05 MED ORDER — LEVALBUTEROL HCL 0.63 MG/3ML IN NEBU
0.6300 mg | INHALATION_SOLUTION | Freq: Four times a day (QID) | RESPIRATORY_TRACT | Status: DC | PRN
  Administered 2022-06-05 – 2022-06-07 (×3): 0.63 mg via RESPIRATORY_TRACT
  Filled 2022-06-05 (×3): qty 3

## 2022-06-05 NOTE — Progress Notes (Signed)
PROGRESS NOTE   Benjamin Case  KGY:[id number] DOB: 02/06/1956 DOA: 05/14/2022 PCP: [name], MD  Brief Narrative:  66 year old white male stage IV metastatic lung CA L pneumonectomy 2009 adjuvant chemo 2009-started on 3 cycles palliative therapy carboplatin paclitaxel discontinued secondary to progression and then started on gemcitabine 12/28-intent of treatment is palliative Paroxysmal A-fib/Eliquis CHADVASC >4 has bled score >3 prior GI bleed 05/02/2022--- he is supposed to be on DAPT and was discontinued off of anticoagulation because of prior GI bleed and placed on DAPT by [name] Prior CVA Treated hepatitis C Smoker X50 years Peripheral neuropathy Chronic hypoxia on 2 L nasal cannula Paroxysmal A-fib RVR since 04/17/2022 DM TY 2 Chronic EtOH Prior hemilaminectomy microdiscectomy 2007 with spinal stenosis and further surgery 2017 Dr. Gladstone Lighter Several admissions for pneumonia  Seen by Dr. Earlie Server in office 12/27 with intent to start gemcitabine-called office next day with difficulty breathing dark stool low blood pressure  Hemoglobin found to be 5.8 down from 10 transfused 2 units PRBC fluid bolus Protonix started GI Dr. Watt Climes evaluated-recommended clear liquids and holding aspirin Plavix for 3 to 5 days and will scope  Hospital-Problem based course  Acute GI bleed lower -Bleeding seems to have subsided-patient is on DAPT which has been discontinued - Patient does not wish to have scope in hospital so we will graduate diet - He is on Protonix gtt. and we will follow GI bleed with regards to when to discontinue  Anemia blood loss Transfused 2 units PRBC with good result  Hypoxic respiratory failure superimposed on underlying Stage IV metastatic lung cancer on several lines of chemo supposed to start third line gemcitabine 12/28, smoker X50 years Acute superimposed on chronic respiratory failure on 2-3 L oxygen baseline - His work of breathing was increased on 12/29  likely secondary to transfusion - Lasix 40 given IV and will repeat again IV as he is 2 L positive-continue this dosing at this time and transition to orals - Patient declines palliative care input and does not seem to understand that he is at end-stage and on third line chemo - He is full code unfortunately and hopefully we can get him through this hospitalization  Paroxysmal A-fib CHADVASC >3, has bled >3 - DAPT will have to be discontinued - He went into A-fib RVR and was placed on Cardizem drip which can be turned off -I will switch his metoprolol XL to regular 25 twice daily  DM TY 2 - This appears to be diet controlled he is not on any meds for this-monitor  Lower back pain secondary to prior hemilaminectomy - Careful restart on oxycodone 10/325 resumed gabapentin 75 twice daily - Can continue Xanax 1 mg 3 times daily as needed anxiety    DVT prophylaxis: SCD Code Status: Full Family Communication: Discussed with wife at bedside Disposition:  Status is: Inpatient Remains inpatient appropriate because:  Several metabolic parameters have to resolve in addition to his A-fib    Subjective:  Coherent increased work of breathing some wheeze-feels better after neb no chest pain no fever ROM is intact He tells me he does not want to talk with palliative care after I tell him he has an incurable disease and states that he will try to do everything in his power to fight this  Objective: Vitals:   06/05/22 0800 06/05/22 0900 06/05/22 1000 06/05/22 1100  BP: 130/75 (!) 136/91 (!) 108/48 103/70  Pulse: 99 (!) 127 (!) 108 (!) 101  Resp: 14 17 (!) 21 (!)  32  Temp:      TempSrc:      SpO2: 97% 94% 94% 94%  Weight:      Height:        Intake/Output Summary (Last 24 hours) at 06/05/2022 1216 Last data filed at 06/05/2022 0557 Gross per 24 hour  Intake --  Output 2400 ml  Net -2400 ml    Filed Weights   05/18/2022 1815 06/04/22 0430 06/05/22 0500  Weight: 75 kg 76.2 kg 75.8  kg    Examination:  EOMI NCAT no focal deficit no icterus no pallor no rales no rhonchi no wheeze Some wheeze no rales rhonchi ROM intact S1-S2 sinus tach/A-fib Abdomen soft no rebound  Data Reviewed: personally reviewed   CBC    Component Value Date/Time   WBC 18.5 (H) 06/05/2022 0530   RBC 3.02 (L) 06/05/2022 0530   HGB 9.5 (L) 06/05/2022 0530   HGB 10.6 (L) 05/27/2022 0809   HGB 16.4 04/26/2016 1205   HCT 29.7 (L) 06/05/2022 0530   HCT 49.6 04/26/2016 1205   PLT 477 (H) 06/05/2022 0530   PLT 460 (H) 05/27/2022 0809   PLT 346 04/26/2016 1205   MCV 98.3 06/05/2022 0530   MCV 105.2 (H) 04/26/2016 1205   MCH 31.5 06/05/2022 0530   MCHC 32.0 06/05/2022 0530   RDW 17.4 (H) 06/05/2022 0530   RDW 13.1 04/26/2016 1205   LYMPHSABS 1.8 06/05/2022 0530   LYMPHSABS 2.0 04/26/2016 1205   MONOABS 1.8 (H) 06/05/2022 0530   MONOABS 0.9 04/26/2016 1205   EOSABS 1.0 (H) 06/05/2022 0530   EOSABS 0.2 04/26/2016 1205   BASOSABS 0.1 06/05/2022 0530   BASOSABS 0.1 04/26/2016 1205      Latest Ref Rng & Units 06/04/2022    1:04 AM 05/14/2022    1:15 PM 05/27/2022    8:09 AM  CMP  Glucose 70 - 99 mg/dL 118  144  120   BUN 8 - 23 mg/dL 26  41  17   Creatinine 0.61 - 1.24 mg/dL 1.26  1.64  1.25   Sodium 135 - 145 mmol/L 136  137  129   Potassium 3.5 - 5.1 mmol/L 3.7  4.0  3.9   Chloride 98 - 111 mmol/L 99  98  88   CO2 22 - 32 mmol/L 28  30  31    Calcium 8.9 - 10.3 mg/dL 7.9  8.1  8.4   Total Protein 6.5 - 8.1 g/dL 6.0  6.6  6.9   Total Bilirubin 0.3 - 1.2 mg/dL 0.6  0.6  0.3   Alkaline Phos 38 - 126 U/L 63  65  96   AST 15 - 41 U/L 29  32  25   ALT 0 - 44 U/L 28  34  14      Radiology Studies: DG CHEST PORT 1 VIEW  Result Date: 06/04/2022 CLINICAL DATA:  Shortness of breath. EXAM: PORTABLE CHEST 1 VIEW COMPARISON:  Chest radiograph dated 05/20/2022. FINDINGS: Similar appearance of complete opacification of the left hemithorax with volume loss and shift of the mediastinum  into the left hemithorax. Diffuse right lung interstitial densities which may represent edema, or pneumonia. No pneumothorax. Atherosclerotic calcification of the aorta. No acute osseous pathology. IMPRESSION: No significant interval change. Electronically Signed   By: Anner Crete M.D.   On: 06/04/2022 02:28   US RENAL  Result Date: 05/31/2022 CLINICAL DATA:  Acute kidney injury EXAM: RENAL / URINARY TRACT ULTRASOUND COMPLETE COMPARISON:  05/13/2022 FINDINGS: Right Kidney:  Renal measurements: 10.1 x 4.4 x 5.4 cm = volume: 125 mL. Echogenicity within normal limits. No mass, shadowing stone, or hydronephrosis visualized. Left Kidney: Renal measurements: 10.8 x 5.7 x 5.2 cm = volume: 167 mL. Echogenicity within normal limits. 2.9 cm simple cyst in the lower pole of the left kidney. No solid mass, shadowing stone, or hydronephrosis visualized. Bladder: Appears normal for degree of bladder distention. Other: Numerous hypoechoic liver masses.  Right-sided pleural effusion. IMPRESSION: 1. No evidence of obstructive uropathy. 2. Numerous hypoechoic liver masses, concerning for metastatic disease. 3. Right-sided pleural effusion. Electronically Signed   By: Davina Poke D.O.   On: 05/16/2022 19:07     Scheduled Meds:  vitamin C  1,000 mg Oral Daily   atorvastatin  10 mg Oral QHS   calcium carbonate  1 tablet Oral BID   cyanocobalamin  1,000 mcg Oral Daily   folic acid  1 mg Oral Daily   furosemide  40 mg Intravenous Q12H   guaiFENesin  600 mg Oral BID   ipratropium-albuterol  3 mL Nebulization BID   levalbuterol       metoprolol tartrate  25 mg Oral BID   omega-3 acid ethyl esters  1 g Oral QPM   pantoprazole  40 mg Oral BID   pregabalin  75 mg Oral BID   thiamine  100 mg Oral Daily   Continuous Infusions:  sodium chloride Stopped (06/04/22 0124)   diltiazem (CARDIZEM) infusion Stopped (06/04/22 1106)     LOS: 2 days   Time spent: 3  Nita Sells, MD [facility] To  contact the attending provider between 7A-7P or the covering provider during after hours 7P-7A, please log into the web site [email or web address] and access using universal [place] password for that web site. If you do not have the password, please call the hospital operator.  06/05/2022, 12:16 PM

## 2022-06-06 ENCOUNTER — Other Ambulatory Visit: Payer: Self-pay

## 2022-06-06 DIAGNOSIS — J9611 Chronic respiratory failure with hypoxia: Secondary | ICD-10-CM

## 2022-06-06 DIAGNOSIS — D72829 Elevated white blood cell count, unspecified: Secondary | ICD-10-CM | POA: Diagnosis not present

## 2022-06-06 DIAGNOSIS — N179 Acute kidney failure, unspecified: Secondary | ICD-10-CM | POA: Diagnosis not present

## 2022-06-06 DIAGNOSIS — I480 Paroxysmal atrial fibrillation: Secondary | ICD-10-CM | POA: Diagnosis not present

## 2022-06-06 DIAGNOSIS — K922 Gastrointestinal hemorrhage, unspecified: Secondary | ICD-10-CM | POA: Diagnosis not present

## 2022-06-06 LAB — CBC WITH DIFFERENTIAL/PLATELET
Abs Immature Granulocytes: 0.11 10*3/uL — ABNORMAL HIGH (ref 0.00–0.07)
Basophils Absolute: 0.1 10*3/uL (ref 0.0–0.1)
Basophils Relative: 1 %
Eosinophils Absolute: 0.5 10*3/uL (ref 0.0–0.5)
Eosinophils Relative: 2 %
HCT: 29.9 % — ABNORMAL LOW (ref 39.0–52.0)
Hemoglobin: 9.4 g/dL — ABNORMAL LOW (ref 13.0–17.0)
Immature Granulocytes: 1 %
Lymphocytes Relative: 5 %
Lymphs Abs: 1.1 10*3/uL (ref 0.7–4.0)
MCH: 30.8 pg (ref 26.0–34.0)
MCHC: 31.4 g/dL (ref 30.0–36.0)
MCV: 98 fL (ref 80.0–100.0)
Monocytes Absolute: 1.5 10*3/uL — ABNORMAL HIGH (ref 0.1–1.0)
Monocytes Relative: 7 %
Neutro Abs: 17 10*3/uL — ABNORMAL HIGH (ref 1.7–7.7)
Neutrophils Relative %: 84 %
Platelets: 518 10*3/uL — ABNORMAL HIGH (ref 150–400)
RBC: 3.05 MIL/uL — ABNORMAL LOW (ref 4.22–5.81)
RDW: 16.5 % — ABNORMAL HIGH (ref 11.5–15.5)
WBC: 20.2 10*3/uL — ABNORMAL HIGH (ref 4.0–10.5)
nRBC: 0 % (ref 0.0–0.2)

## 2022-06-06 LAB — COMPREHENSIVE METABOLIC PANEL
ALT: 25 U/L (ref 0–44)
AST: 28 U/L (ref 15–41)
Albumin: 3.4 g/dL — ABNORMAL LOW (ref 3.5–5.0)
Alkaline Phosphatase: 66 U/L (ref 38–126)
Anion gap: 11 (ref 5–15)
BUN: 26 mg/dL — ABNORMAL HIGH (ref 8–23)
CO2: 33 mmol/L — ABNORMAL HIGH (ref 22–32)
Calcium: 8.4 mg/dL — ABNORMAL LOW (ref 8.9–10.3)
Chloride: 91 mmol/L — ABNORMAL LOW (ref 98–111)
Creatinine, Ser: 1.47 mg/dL — ABNORMAL HIGH (ref 0.61–1.24)
GFR, Estimated: 52 mL/min — ABNORMAL LOW (ref >60)
Glucose, Bld: 159 mg/dL — ABNORMAL HIGH (ref 70–99)
Potassium: 3.6 mmol/L (ref 3.5–5.1)
Sodium: 135 mmol/L (ref 135–145)
Total Bilirubin: 0.8 mg/dL (ref 0.3–1.2)
Total Protein: 6.6 g/dL (ref 6.5–8.1)

## 2022-06-06 LAB — MAGNESIUM: Magnesium: 1.5 mg/dL — ABNORMAL LOW (ref 1.7–2.4)

## 2022-06-06 MED ORDER — METOPROLOL TARTRATE 5 MG/5ML IV SOLN: 2.5000 mg | Freq: Once | INTRAVENOUS | Status: DC

## 2022-06-06 MED ORDER — METOPROLOL TARTRATE 5 MG/5ML IV SOLN
2.5000 mg | Freq: Once | INTRAVENOUS | Status: AC
  Administered 2022-06-06: 2.5 mg via INTRAVENOUS
  Filled 2022-06-06: qty 5

## 2022-06-06 MED ORDER — IPRATROPIUM BROMIDE 0.02 % IN SOLN
0.5000 mg | Freq: Four times a day (QID) | RESPIRATORY_TRACT | Status: DC | PRN
  Administered 2022-06-07 (×2): 0.5 mg via RESPIRATORY_TRACT
  Filled 2022-06-06 (×2): qty 2.5

## 2022-06-06 MED ORDER — POTASSIUM CHLORIDE CRYS ER 20 MEQ PO TBCR
40.0000 meq | EXTENDED_RELEASE_TABLET | Freq: Once | ORAL | Status: AC
  Administered 2022-06-06: 40 meq via ORAL
  Filled 2022-06-06: qty 2

## 2022-06-06 MED ORDER — AMIODARONE IV BOLUS ONLY 150 MG/100ML
150.0000 mg | Freq: Once | INTRAVENOUS | Status: AC
  Administered 2022-06-06: 150 mg via INTRAVENOUS
  Filled 2022-06-06: qty 100

## 2022-06-06 MED ORDER — FUROSEMIDE 10 MG/ML IJ SOLN: 40.0000 mg | Freq: Every day | INTRAMUSCULAR | Status: DC

## 2022-06-06 MED ORDER — MAGNESIUM SULFATE 2 GM/50ML IV SOLN
2.0000 g | Freq: Once | INTRAVENOUS | Status: AC
  Administered 2022-06-06: 2 g via INTRAVENOUS
  Filled 2022-06-06: qty 50

## 2022-06-06 NOTE — Progress Notes (Signed)
   06/06/22 0600  Assess: MEWS Score  Temp 99 F (37.2 C)  BP (!) 89/60  MAP (mmHg) 71  Pulse Rate (!) 137  ECG Heart Rate (!) 140  Resp 17  SpO2 90 %  O2 Device Nasal Cannula  Heater temperature 41 F (5 C)  Assess: MEWS Score  MEWS Temp 0  MEWS Systolic 1  MEWS Pulse 3  MEWS RR 0  MEWS LOC 0  MEWS Score 4  MEWS Score Color Red  Assess: if the MEWS score is Yellow or Red  Were vital signs taken at a resting state? Yes  Focused Assessment No change from prior assessment  Does the patient meet 2 or more of the SIRS criteria? Yes  Does the patient have a confirmed or suspected source of infection? No  Provider and Rapid Response Notified? Yes  Provider Notification  Provider Name/Title Raenette Rover, NP  Date Provider Notified 06/06/22  Assess: SIRS CRITERIA  SIRS Temperature  0  SIRS Pulse 1  SIRS Respirations  0  SIRS WBC 1  SIRS Score Sum  2

## 2022-06-06 NOTE — Progress Notes (Signed)
[facility]                                                                               Benjamin Case, is a 66 y.o. male, DOB - Jul 29, 1955, SAY:[id number] Admit date - 05/12/2022    Outpatient Primary MD for the patient is [name], MD  LOS - 3  days    Brief summary   66 year old white male stage IV metastatic lung CA,  L pneumonectomy 2009 adjuvant chemo 2009-started on 3 cycles of  palliative therapy, discontinued secondary to progression, presents with melanotic stools and generalized weakness. He was admitted for GI bleed and DAPT discontinued. GI consulted, recommended outpatient EGD.   Assessment & Plan    Assessment and Plan:   Metastatic lung cancer : bone , liver, lymph and brain metastasis.   L pneumonectomy 2009 adjuvant chemo 2009-started on 3 cycles of  palliative therapy, discontinued secondary to progression Follows up with Dr Julien Nordmann.    GI bleed secondary ? Upper GI bleed.  Guiac positive stools. Transfuse to keep hemoglobin greater than 8.  Hemoglobin stable around 9.  GI consulted, recommended outpatient EGD.  He is able to tolerate soft diet/ regular diet.    PAF  Rate not well controlled.  Anti coagulation and DAPT held.  Rate control with IV cardizem gtt.  Soft BP parameters, hence metoprolol on hold for today.  Keep  k >4 and magnesium greater than 2.  Cardiology will be consulted in am.    Hypomagnesemia:  Replaced.    Mild AKI:  Baseline creatinine around 1.2 , slight worsening of creatinine to 1.47. Decreased the dose of lasix to daily. Recheck renal parameters in am.     Acute  on chronic respiratory failure in the setting of stage 4 metastatic lung ca; Currently requiring 4 lit of [place] oxygen.  Suspect from fluid overload due to transfusions.  He is currently on IV lasix 40 mg daily.  Continue with strict intake and output. Daily weights.    Type 2 Diabetes mellitus:  diet controlled.  CBG (last 3)  No  results for input(s): "GLUCAP" in the last 72 hours.   Lower back pain sec to prior hemilaminectomy:  Pain control and therapy evaluations will be ordered.    Leukocytosis and thrombocytosis:  ? Reactive.     Estimated body mass index is 23.12 kg/m as calculated from the following:   Height as of this encounter: 5\' 10"  (1.778 m).   Weight as of this encounter: 73.1 kg.  Code Status: full code.  DVT Prophylaxis:  SCDs Start: 05/24/2022 1812   Level of Care: Level of care: Progressive Family Communication: family at bedside.   Disposition Plan:     Remains inpatient appropriate:  IV lasix and atrial fib with RVR.   Procedures:  None.   Consultants:   None.   Antimicrobials:   Anti-infectives (From admission, onward)    None        Medications  Scheduled Meds:  vitamin C  1,000 mg Oral Daily   atorvastatin  10 mg Oral QHS   calcium carbonate  1 tablet Oral BID   cyanocobalamin  1,000 mcg Oral  Daily   folic acid  1 mg Oral Daily   [START ON 06/07/2022] furosemide  40 mg Intravenous Daily   guaiFENesin  600 mg Oral BID   metoprolol tartrate  2.5 mg Intravenous Once   metoprolol tartrate  25 mg Oral BID   omega-3 acid ethyl esters  1 g Oral QPM   pantoprazole  40 mg Oral BID   pregabalin  75 mg Oral BID   thiamine  100 mg Oral Daily   Continuous Infusions:  sodium chloride Stopped (06/04/22 0124)   diltiazem (CARDIZEM) infusion Stopped (06/04/22 1106)   PRN Meds:.ALPRAZolam, ipratropium, levalbuterol, ondansetron **OR** ondansetron (ZOFRAN) IV, oxyCODONE-acetaminophen **AND** oxyCODONE    Subjective:   Benjamin Case was seen and examined today.  Some sob. No wheezing. On 4 lit of [place] oxygen.   Objective:   Vitals:   06/06/22 0530 06/06/22 0600 06/06/22 0815 06/06/22 1000  BP: 105/63 (!) 89/60 (!) 101/59 90/63  Pulse: (!) 134 (!) 137  (!) 106  Resp: 20 17  14   Temp:  99 F (37.2 C)    TempSrc:  Oral    SpO2: 90% 90% 92% 94%  Weight:      Height:         Intake/Output Summary (Last 24 hours) at 06/06/2022 1550 Last data filed at 06/06/2022 1500 Gross per 24 hour  Intake 100 ml  Output 1250 ml  Net -1150 ml   Filed Weights   06/04/22 0430 06/05/22 0500 06/06/22 0441  Weight: 76.2 kg 75.8 kg 73.1 kg     Exam General exam: Ill appearing gentleman, not in distress. On 4 lit of [place] oxygen.  Respiratory system: diminished air entry at bases. No wheezing heard.  Cardiovascular system: S1 & S2 heard, irregularly irregular, no JVD.  No pedal edema. Gastrointestinal system: Abdomen is nondistended, soft and nontender.  Central nervous system: Alert and oriented. No focal neurological deficits. Extremities: Symmetric 5 x 5 power. Skin: No rashes, lesions or ulcers Psychiatry: Mood & affect appropriate.     Data Reviewed:  I have personally reviewed following labs and imaging studies   CBC Lab Results  Component Value Date   WBC 20.2 (H) 06/06/2022   RBC 3.05 (L) 06/06/2022   HGB 9.4 (L) 06/06/2022   HCT 29.9 (L) 06/06/2022   MCV 98.0 06/06/2022   MCH 30.8 06/06/2022   PLT 518 (H) 06/06/2022   MCHC 31.4 06/06/2022   RDW 16.5 (H) 06/06/2022   LYMPHSABS 1.1 06/06/2022   MONOABS 1.5 (H) 06/06/2022   EOSABS 0.5 06/06/2022   BASOSABS 0.1 47/42/5956     Last metabolic panel Lab Results  Component Value Date   NA 135 06/06/2022   K 3.6 06/06/2022   CL 91 (L) 06/06/2022   CO2 33 (H) 06/06/2022   BUN 26 (H) 06/06/2022   CREATININE 1.47 (H) 06/06/2022   GLUCOSE 159 (H) 06/06/2022   GFRNONAA 52 (L) 06/06/2022   GFRAA >60 12/11/2018   CALCIUM 8.4 (L) 06/06/2022   PHOS 3.6 05/11/2022   PROT 6.6 06/06/2022   ALBUMIN 3.4 (L) 06/06/2022   BILITOT 0.8 06/06/2022   ALKPHOS 66 06/06/2022   AST 28 06/06/2022   ALT 25 06/06/2022   ANIONGAP 11 06/06/2022    CBG (last 3)  No results for input(s): "GLUCAP" in the last 72 hours.    Coagulation Profile: Recent Labs  Lab 06/02/2022 1315  INR 1.1     Radiology  Studies: No results found.  Time spent: 40 minutes.  Hosie Poisson M.D. [facility] 06/06/2022, 3:50 PM  Available via Epic secure chat 7am-7pm After 7 pm, please refer to night coverage provider listed on amion.

## 2022-06-06 NOTE — Progress Notes (Addendum)
       CROSS COVER NOTE  NAME: Benjamin Case MRN: [id number] DOB : [birth year]    Date of Service   06/06/2022   HPI/Events of Note   Notified by RN of HR 140-150's.  Patient has a history of PAF.  Currently on metoprolol XL 25 mg twice daily.   Patient denies CP, SOB, palpitations, nausea, or vomiting. does not appear to be in distress.  IV metoprolol 2.5 mg x 2 ordered.  After metoprolol administration, HR now ranging 110-120's.  RN has been asked to continue monitoring heart rate and BP.  0625- Despite metoprolol use, HR increasing back to 130-140's.  EKG confirmed A-fib RVR.  Amiodarone bolus 150 mg ordered as BP soft, 80/60's.  Electrolyte replacement for magnesium also ordered.  0650-amiodarone infusion delayed due to lack of infusion pump equipment.  Verbally verified with RN to start amiodarone bolus, okay to infuse over 20 minutes.  RN to follow-up with day team if needed or if HR> 120.   Interventions/ Plan   IV metoprolol 2.5 mg x 2 IV amiodarone 150 mg bolus x 1       Raenette Rover, DNP, [place]

## 2022-06-07 ENCOUNTER — Encounter (HOSPITAL_COMMUNITY): Payer: Self-pay | Admitting: Internal Medicine

## 2022-06-07 ENCOUNTER — Inpatient Hospital Stay (HOSPITAL_COMMUNITY): Payer: Medicare Other

## 2022-06-07 DIAGNOSIS — J441 Chronic obstructive pulmonary disease with (acute) exacerbation: Secondary | ICD-10-CM

## 2022-06-07 DIAGNOSIS — I5032 Chronic diastolic (congestive) heart failure: Secondary | ICD-10-CM | POA: Diagnosis not present

## 2022-06-07 DIAGNOSIS — J9621 Acute and chronic respiratory failure with hypoxia: Secondary | ICD-10-CM

## 2022-06-07 DIAGNOSIS — Z7189 Other specified counseling: Secondary | ICD-10-CM

## 2022-06-07 DIAGNOSIS — Z515 Encounter for palliative care: Secondary | ICD-10-CM | POA: Diagnosis not present

## 2022-06-07 DIAGNOSIS — N179 Acute kidney failure, unspecified: Secondary | ICD-10-CM | POA: Diagnosis not present

## 2022-06-07 DIAGNOSIS — Z66 Do not resuscitate: Secondary | ICD-10-CM

## 2022-06-07 DIAGNOSIS — K922 Gastrointestinal hemorrhage, unspecified: Secondary | ICD-10-CM | POA: Diagnosis not present

## 2022-06-07 DIAGNOSIS — I480 Paroxysmal atrial fibrillation: Secondary | ICD-10-CM | POA: Diagnosis not present

## 2022-06-07 DIAGNOSIS — D72829 Elevated white blood cell count, unspecified: Secondary | ICD-10-CM | POA: Diagnosis not present

## 2022-06-07 LAB — BLOOD GAS, ARTERIAL
Acid-Base Excess: 5.9 mmol/L — ABNORMAL HIGH (ref 0.0–2.0)
Bicarbonate: 36.4 mmol/L — ABNORMAL HIGH (ref 20.0–28.0)
Delivery systems: POSITIVE
Drawn by: 321471
FIO2: 40 %
MECHVT: 18 mL
Mode: 181
O2 Saturation: 97.1 %
Patient temperature: 37
pCO2 arterial: 85 mmHg (ref 32–48)
pH, Arterial: 7.24 — ABNORMAL LOW (ref 7.35–7.45)
pO2, Arterial: 91 mmHg (ref 83–108)

## 2022-06-07 LAB — CBC WITH DIFFERENTIAL/PLATELET
Abs Immature Granulocytes: 0.1 10*3/uL — ABNORMAL HIGH (ref 0.00–0.07)
Basophils Absolute: 0.1 10*3/uL (ref 0.0–0.1)
Basophils Relative: 1 %
Eosinophils Absolute: 0.4 10*3/uL (ref 0.0–0.5)
Eosinophils Relative: 2 %
HCT: 33.9 % — ABNORMAL LOW (ref 39.0–52.0)
Hemoglobin: 10.4 g/dL — ABNORMAL LOW (ref 13.0–17.0)
Immature Granulocytes: 1 %
Lymphocytes Relative: 5 %
Lymphs Abs: 0.9 10*3/uL (ref 0.7–4.0)
MCH: 30.6 pg (ref 26.0–34.0)
MCHC: 30.7 g/dL (ref 30.0–36.0)
MCV: 99.7 fL (ref 80.0–100.0)
Monocytes Absolute: 1.3 10*3/uL — ABNORMAL HIGH (ref 0.1–1.0)
Monocytes Relative: 7 %
Neutro Abs: 16.1 10*3/uL — ABNORMAL HIGH (ref 1.7–7.7)
Neutrophils Relative %: 84 %
Platelets: 587 10*3/uL — ABNORMAL HIGH (ref 150–400)
RBC: 3.4 MIL/uL — ABNORMAL LOW (ref 4.22–5.81)
RDW: 16 % — ABNORMAL HIGH (ref 11.5–15.5)
WBC: 18.9 10*3/uL — ABNORMAL HIGH (ref 4.0–10.5)
nRBC: 0 % (ref 0.0–0.2)

## 2022-06-07 LAB — GLUCOSE, CAPILLARY: Glucose-Capillary: 173 mg/dL — ABNORMAL HIGH (ref 70–99)

## 2022-06-07 LAB — MAGNESIUM: Magnesium: 1.7 mg/dL (ref 1.7–2.4)

## 2022-06-07 LAB — BASIC METABOLIC PANEL
Anion gap: 9 (ref 5–15)
BUN: 31 mg/dL — ABNORMAL HIGH (ref 8–23)
CO2: 33 mmol/L — ABNORMAL HIGH (ref 22–32)
Calcium: 8.6 mg/dL — ABNORMAL LOW (ref 8.9–10.3)
Chloride: 92 mmol/L — ABNORMAL LOW (ref 98–111)
Creatinine, Ser: 1.64 mg/dL — ABNORMAL HIGH (ref 0.61–1.24)
GFR, Estimated: 45 mL/min — ABNORMAL LOW (ref >60)
Glucose, Bld: 198 mg/dL — ABNORMAL HIGH (ref 70–99)
Potassium: 4.2 mmol/L (ref 3.5–5.1)
Sodium: 134 mmol/L — ABNORMAL LOW (ref 135–145)

## 2022-06-07 LAB — PHOSPHORUS: Phosphorus: 6 mg/dL — ABNORMAL HIGH (ref 2.5–4.6)

## 2022-06-07 MED ORDER — ORAL CARE MOUTH RINSE: 15.0000 mL | OROMUCOSAL | Status: DC

## 2022-06-07 MED ORDER — GLYCOPYRROLATE 0.2 MG/ML IJ SOLN
0.2000 mg | INTRAMUSCULAR | Status: DC | PRN
  Administered 2022-06-08 (×2): 0.2 mg via INTRAVENOUS
  Filled 2022-06-07 (×2): qty 1

## 2022-06-07 MED ORDER — LORAZEPAM 2 MG/ML IJ SOLN: 0.5000 mg | Freq: Four times a day (QID) | INTRAMUSCULAR | Status: DC | PRN

## 2022-06-07 MED ORDER — HYDROMORPHONE HCL-NACL 50-0.9 MG/50ML-% IV SOLN: 0.3000 mg/h | INTRAVENOUS | Status: DC

## 2022-06-07 MED ORDER — POLYVINYL ALCOHOL 1.4 % OP SOLN: 1.0000 [drp] | Freq: Four times a day (QID) | OPHTHALMIC | Status: DC | PRN

## 2022-06-07 MED ORDER — MORPHINE SULFATE (PF) 2 MG/ML IV SOLN: 0.5000 mg | INTRAVENOUS | Status: DC | PRN

## 2022-06-07 MED ORDER — SODIUM CHLORIDE 0.9 % IV BOLUS
250.0000 mL | Freq: Once | INTRAVENOUS | Status: AC
  Administered 2022-06-07: 250 mL via INTRAVENOUS

## 2022-06-07 MED ORDER — DEXTROSE 5 % IV SOLN
250.0000 mg | INTRAVENOUS | Status: DC
  Filled 2022-06-07 (×2): qty 2.5

## 2022-06-07 MED ORDER — CHLORHEXIDINE GLUCONATE CLOTH 2 % EX PADS
6.0000 | MEDICATED_PAD | Freq: Every day | CUTANEOUS | Status: DC
  Administered 2022-06-07: 6 via TOPICAL

## 2022-06-07 MED ORDER — METHYLPREDNISOLONE SODIUM SUCC 40 MG IJ SOLR: 40.0000 mg | INTRAMUSCULAR | Status: DC

## 2022-06-07 MED ORDER — FUROSEMIDE 10 MG/ML IJ SOLN
40.0000 mg | Freq: Once | INTRAMUSCULAR | Status: AC
  Administered 2022-06-07: 40 mg via INTRAVENOUS
  Filled 2022-06-07: qty 4

## 2022-06-07 MED ORDER — REVEFENACIN 175 MCG/3ML IN SOLN
175.0000 ug | Freq: Every day | RESPIRATORY_TRACT | Status: DC
  Administered 2022-06-07: 175 ug via RESPIRATORY_TRACT
  Filled 2022-06-07: qty 3

## 2022-06-07 MED ORDER — HYDROMORPHONE BOLUS VIA INFUSION
0.5000 mg | INTRAVENOUS | Status: DC | PRN
  Administered 2022-06-07 – 2022-06-08 (×2): 0.5 mg via INTRAVENOUS

## 2022-06-07 MED ORDER — ARFORMOTEROL TARTRATE 15 MCG/2ML IN NEBU
15.0000 ug | INHALATION_SOLUTION | Freq: Two times a day (BID) | RESPIRATORY_TRACT | Status: DC
  Administered 2022-06-07: 15 ug via RESPIRATORY_TRACT
  Filled 2022-06-07: qty 2

## 2022-06-07 MED ORDER — BUDESONIDE 0.25 MG/2ML IN SUSP
0.2500 mg | Freq: Two times a day (BID) | RESPIRATORY_TRACT | Status: DC
  Administered 2022-06-07: 0.25 mg via RESPIRATORY_TRACT
  Filled 2022-06-07: qty 2

## 2022-06-07 MED ORDER — METOPROLOL TARTRATE 5 MG/5ML IV SOLN: 2.5000 mg | Freq: Three times a day (TID) | INTRAVENOUS | Status: DC | PRN

## 2022-06-07 MED ORDER — METHYLPREDNISOLONE SODIUM SUCC 125 MG IJ SOLR
125.0000 mg | Freq: Once | INTRAMUSCULAR | Status: AC
  Administered 2022-06-07: 125 mg via INTRAVENOUS
  Filled 2022-06-07: qty 2

## 2022-06-07 MED ORDER — ORAL CARE MOUTH RINSE: 15.0000 mL | OROMUCOSAL | Status: DC | PRN

## 2022-06-07 MED ORDER — HYDROMORPHONE HCL 1 MG/ML IJ SOLN
0.5000 mg | Freq: Once | INTRAMUSCULAR | Status: AC
  Administered 2022-06-07: 0.5 mg via INTRAVENOUS
  Filled 2022-06-07: qty 1

## 2022-06-07 MED ORDER — SODIUM CHLORIDE 0.9 % IV SOLN
500.0000 mg | INTRAVENOUS | Status: DC
  Administered 2022-06-07: 500 mg via INTRAVENOUS
  Filled 2022-06-07: qty 5

## 2022-06-07 MED ORDER — LORAZEPAM 2 MG/ML IJ SOLN
0.5000 mg | INTRAMUSCULAR | Status: DC | PRN
  Administered 2022-06-08: 0.5 mg via INTRAVENOUS
  Filled 2022-06-07: qty 1

## 2022-06-07 MED ORDER — BIOTENE DRY MOUTH MT LIQD: 15.0000 mL | OROMUCOSAL | Status: DC | PRN

## 2022-06-07 MED ORDER — HALOPERIDOL LACTATE 5 MG/ML IJ SOLN: 2.0000 mg | Freq: Four times a day (QID) | INTRAMUSCULAR | Status: DC | PRN

## 2022-06-07 MED ORDER — SODIUM CHLORIDE 0.9 % IV SOLN
1.0000 mg/h | INTRAVENOUS | Status: DC
  Administered 2022-06-07: 0.3 mg/h via INTRAVENOUS
  Administered 2022-06-08: 0.5 mg/h via INTRAVENOUS
  Filled 2022-06-07 (×2): qty 5

## 2022-06-07 MED ORDER — ALBUTEROL SULFATE (2.5 MG/3ML) 0.083% IN NEBU
2.5000 mg | INHALATION_SOLUTION | Freq: Four times a day (QID) | RESPIRATORY_TRACT | Status: DC
  Administered 2022-06-07 (×2): 2.5 mg via RESPIRATORY_TRACT
  Filled 2022-06-07 (×2): qty 3

## 2022-06-07 MED ORDER — HALOPERIDOL LACTATE 5 MG/ML IJ SOLN: 0.5000 mg | INTRAMUSCULAR | Status: DC | PRN

## 2022-06-07 MED ORDER — METOPROLOL TARTRATE 5 MG/5ML IV SOLN
2.5000 mg | Freq: Once | INTRAVENOUS | Status: AC
  Administered 2022-06-07: 2.5 mg via INTRAVENOUS
  Filled 2022-06-07: qty 5

## 2022-06-07 NOTE — Progress Notes (Signed)
RT was told by RN that she had to go up on pt O2. RT just popped in to check on pt and found him altered. Pt had WOB, SOB and was very slow at speaking. RT ask RN to call a rapid response. RT gave pt breathing tx and placed back on BIPAP. ABG was order and obtained. Pt was transferred to ICU.

## 2022-06-07 NOTE — Consult Note (Signed)
NAME:  Benjamin Case, MRN:  [id number], DOB:  [birth year], LOS: 4 ADMISSION DATE:  05/12/2022, CONSULTATION DATE:  06/07/2022 REFERRING MD:  Dr. Karleen Hampshire, CHIEF COMPLAINT:  Respiratory failure    History of Present Illness:   This is a 67 year old gentleman, past medical history of diabetes, hepatitis C, hypertension, history of left pneumonectomy by Dr. 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  From respiratory standpoint he is doing okay.  Patient had a CT scan of the chest on 04/29/2021 by primary care.  This revealed a 1.1 posterior apical right upper lobe lung nodule concerning for primary bronchogenic carcinoma also there was a 6 cm apical right upper lobe nodule patient was referred here for evaluation of lung nodule and neck steps.  Patient already had a PET scan ordered by primary care which is scheduled for mid week.   OV 02/22/2022:Here today for follow-up.  Last seen in the office on 06/17/2021 by SG, NP post bronchoscopy follow-up.  No malignant cells identified in the lung nodule that we biopsied in January.  Recommended to have close CT scan follow-up.  Patient had CT imaging completed in July 2023.  Patient had CT imaging of the chest completed in July 2023 which revealed right paratracheal adenopathy and cavitary enlargement of the pulmonary nodule concerning for malignancy.  Patient was supposed to have follow-up CT imaging in July as well as an appointment to discuss CT results.  Patient was admitted to the hospital on 05/14/2022.  Admitted initially for GI bleed on dual antiplatelet therapy that was held and discontinued.  EGD was also held and planned as outpatient.  Developed ongoing worsening respiratory failure while in the hospital.  Now developed hypercapnia.  Patient was found to have a pH of 724 pCO2 of 85.Also developing worsening renal failure.  Baseline creatinine around 1 now 1.64.  Also  developing hyponatremia with a sodium of 134.  Pertinent  Medical History   Past Medical History:  Diagnosis Date   Arthritis    back   Atrial fibrillation (HCC)    COPD (chronic obstructive pulmonary disease) (HCC)    CVA (cerebral vascular accident) ([place])    Diabetes mellitus without complication ([place])    no medications , monitors cbg at home with monitor, avg no more than 150s at home    Dyspnea    increased exertion; pt states can climb flight of stairs w/o having to stop prior to reaching top ; ongoing reports it is due to "having one lung"    GERD (gastroesophageal reflux disease)    Hepatitis    hepatitis C; pt states competed treatments, unsure what medication he was treated with    History of chemotherapy    Hypertension    reports its been over a year since he stopped taking bp medication, reports he made her medical doctor aware that he was topping it    lung ca dx'd 12/2007   lt lobectomy   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   and back - see note in [facility] 12/26/21   Numbness    FEET BILATERAL   Pneumonia 2009   Right upper lobe pulmonary nodule 04/29/2021   Rotator cuff tear    right      Significant Hospital Events: Including procedures, antibiotic start and stop dates in addition to other pertinent events     Interim History / Subjective:  Per HPI above  Objective  Blood pressure 137/86, pulse (!) 106, temperature 97.6 F (36.4 C), temperature source Oral, resp. rate (!) 24, height _0  (1.778 m), weight 75.9 kg, SpO2 92 %.    FiO2 (%):  [40 %] 40 %   Intake/Output Summary (Last 24 hours) at 06/07/2022 0841 Last data filed at 06/06/2022 1900 Gross per 24 hour  Intake 0 ml  Output 800 ml  Net -800 ml   Filed Weights   06/05/22 0500 06/06/22 0441 06/07/22 0500  Weight: 75.8 kg 73.1 kg 75.9 kg    Examination: General: Chronically ill-appearing elderly gentleman on BiPAP HENT: BiPAP mask in place, hair loss, dry flaking skin on scalp Lungs: Diminished  breath sounds bilaterally, very little air movement in the right chest, no air movement in the left status post pneumonectomy Cardiovascular: Tachycardic, distant heart tones, regular Abdomen: Soft nontender nondistended Extremities: No significant edema Neuro: Alert opens eyes to voice able to answer simple questions 3 mask GU: Deferred  Resolved Hospital Problem list     Assessment & Plan:   Acute on chronic hypoxemic hypercapnic respiratory failure in the setting of metastatic lung cancer, known COPD, acute exacerbation of COPD, left pneumonectomy Plan: 125 mg Solu-Medrol x 1, followed by 40 mg daily Scheduled albuterol treatments every 6 hours while awake, as needed albuterol available every 6 Triple therapy nebulizer regimen, Brovana, Pulmicort, Yupelri  GI bleeding, possible upper GI Plan: Respiratory status tenuous, unable to consider any kind of endoscopic evaluation Hemoglobin stable at this time, continue to observe for any acute bleeding  Paroxysmal atrial fibrillation Plan: Anticoagulation held at this time Blood pressures have been low, holding metoprolol Can use IV calcium channel blockers if needed  Hypomagnesemia Plan Will replete as needed and follow  AKI Plan: Continue to observe, multifactorial etiology  Hyponatremia Likely SIADH in the setting of respiratory failure and hospitalization. Plan: Will observe, supportive care  Best Practice (right click and "Reselect all SmartList Selections" daily)   Diet/type: NPO DVT prophylaxis: other, holding secondary to initial presentation of bleeding GI prophylaxis: PPI Lines: N/A Foley:  N/A Code Status:  DNR Last date of multidisciplinary goals of care discussion [I met with patient and his wife at bedside.  He is a DNR]  Labs   CBC: Recent Labs  Lab 05/28/2022 1315 06/04/22 0104 06/05/22 0530 06/06/22 0418 06/07/22 0423  WBC 20.3* 19.5* 18.5* 20.2* 18.9*  NEUTROABS 17.0*  --  13.6* 17.0* 16.1*   HGB 5.8* 8.9* 9.5* 9.4* 10.4*  HCT 18.2* 27.3* 29.7* 29.9* 33.9*  MCV 106.4* 97.8 98.3 98.0 99.7  PLT 542* 494* 477* 518* 587*    Basic Metabolic Panel: Recent Labs  Lab 05/11/2022 1315 06/04/22 0104 06/06/22 0418 06/07/22 0423  NA 137 136 135 134*  K 4.0 3.7 3.6 4.2  CL 98 99 91* 92*  CO2 30 28 33* 33*  GLUCOSE 144* 118* 159* 198*  BUN 41* 26* 26* 31*  CREATININE 1.64* 1.26* 1.47* 1.64*  CALCIUM 8.1* 7.9* 8.4* 8.6*  MG  --  1.2* 1.5* 1.7  PHOS  --   --   --  6.0*   GFR: Estimated Creatinine Clearance: 44.5 mL/min (A) (by C-G formula based on SCr of 1.64 mg/dL (H)). Recent Labs  Lab 06/04/22 0104 06/05/22 0530 06/06/22 0418 06/07/22 0423  WBC 19.5* 18.5* 20.2* 18.9*    Liver Function Tests: Recent Labs  Lab 05/27/2022 1315 06/04/22 0104 06/06/22 0418  AST 32 29 28  ALT 34 28 25  ALKPHOS 65 63 66  BILITOT 0.6 0.6 0.8  PROT 6.6 6.0* 6.6  ALBUMIN 3.2* 3.1* 3.4*   No results for input(s): "LIPASE", "AMYLASE" in the last 168 hours. No results for input(s): "AMMONIA" in the last 168 hours.  ABG    Component Value Date/Time   PHART 7.24 (L) 06/07/2022 0806   PCO2ART 85 (HH) 06/07/2022 0806   PO2ART 91 06/07/2022 0806   HCO3 36.4 (H) 06/07/2022 0806   TCO2 31.7 01/01/2008 0500   O2SAT 97.1 06/07/2022 0806     Coagulation Profile: Recent Labs  Lab 05/24/2022 1315  INR 1.1    Cardiac Enzymes: No results for input(s): "CKTOTAL", "CKMB", "CKMBINDEX", "TROPONINI" in the last 168 hours.  HbA1C: Hgb A1c MFr Bld  Date/Time Value Ref Range Status  12/11/2018 11:22 AM 5.8 (H) 4.8 - 5.6 % Final    Comment:    (NOTE) Pre diabetes:          5.7%-6.4% Diabetes:              >6.4% Glycemic control for   <7.0% adults with diabetes     CBG: Recent Labs  Lab 06/07/22 0802  GLUCAP 173*    Review of Systems:   Unable to give full history and give full review of systems as he is critically ill short of breath on BiPAP.  Past Medical History:  He,  has a  past medical history of Arthritis, Atrial fibrillation ([place]), COPD (chronic obstructive pulmonary disease) ([place]), CVA (cerebral vascular accident) ([place]), Diabetes mellitus without complication ([place]), Dyspnea, GERD (gastroesophageal reflux disease), Hepatitis, History of chemotherapy, Hypertension, lung ca (dx'd 12/2007), Neuropathy of both feet (12/2021), Numbness, Pneumonia (2009), Right upper lobe pulmonary nodule (04/29/2021), and Rotator cuff tear.   Surgical History:   Past Surgical History:  Procedure Laterality Date   ARTHROSCOPIC REPAIR ACL Right    BRONCHIAL BIOPSY  06/09/2021   Procedure: BRONCHIAL BIOPSIES;  Surgeon: Garner Nash, DO;  Location: [facility];  Service: Pulmonary;;   BRONCHIAL BRUSHINGS  06/09/2021   Procedure: BRONCHIAL BRUSHINGS;  Surgeon: Garner Nash, DO;  Location: [facility];  Service: Pulmonary;;   BRONCHIAL WASHINGS  06/09/2021   Procedure: BRONCHIAL WASHINGS;  Surgeon: Garner Nash, DO;  Location: [facility];  Service: Pulmonary;;   CATARACT EXTRACTION, BILATERAL  2019   DECOMPRESSIVE LUMBAR LAMINECTOMY LEVEL 1 N/A 03/24/2016   Procedure: L3-4 CENTRAL DECOMPRESSION LUMBER LAMINECTOMY;  Surgeon: Latanya Maudlin, MD;  Location: [facility];  Service: Orthopedics;  Laterality: N/A;   FINE NEEDLE ASPIRATION  03/23/2022   Procedure: FINE NEEDLE ASPIRATION (FNA) LINEAR;  Surgeon: Garner Nash, DO;  Location: [facility];  Service: Pulmonary;;   FOOT SURGERY Bilateral    HAND SURGERY Bilateral    LOBECTOMY Left 2009   pneumonectomy Left 2009   [name]   SHOULDER ARTHROSCOPY WITH DISTAL CLAVICLE RESECTION Right 12/14/2018   Procedure: SHOULDER ARTHROSCOPY WITH DISTAL CLAVICLE RESECTION;  Surgeon: Tania Ade, MD;  Location: [facility];  Service: Orthopedics;  Laterality: Right;   SHOULDER ARTHROSCOPY WITH ROTATOR CUFF REPAIR Right 12/14/2018   Procedure: SHOULDER ARTHROSCOPY WITH ROTATOR CUFF REPAIR;  Surgeon: Tania Ade, MD;  Location: [facility];  Service: Orthopedics;  Laterality: Right;   SHOULDER ARTHROSCOPY WITH SUBACROMIAL DECOMPRESSION Right 12/14/2018   Procedure: SHOULDER ARTHROSCOPY WITH SUBACROMIAL DECOMPRESSION;  Surgeon: Tania Ade, MD;  Location: [facility];  Service: Orthopedics;  Laterality: Right;   TRIGGER FINGER RELEASE     right hand, pointer finger    VIDEO BRONCHOSCOPY WITH ENDOBRONCHIAL ULTRASOUND  Right 03/23/2022   Procedure: VIDEO BRONCHOSCOPY WITH ENDOBRONCHIAL ULTRASOUND;  Surgeon: Garner Nash, DO;  Location: [facility];  Service: Pulmonary;  Laterality: Right;   VIDEO BRONCHOSCOPY WITH RADIAL ENDOBRONCHIAL ULTRASOUND  06/09/2021   Procedure: VIDEO BRONCHOSCOPY WITH RADIAL ENDOBRONCHIAL ULTRASOUND;  Surgeon: Garner Nash, DO;  Location: [facility];  Service: Pulmonary;;     Social History:   reports that he has been smoking cigarettes. He has a 22.50 pack-year smoking history. He has never used smokeless tobacco. He reports current alcohol use. He reports that he does not use drugs.   Family History:  His family history includes Cancer in his mother.   Allergies No Known Allergies   Home Medications  Prior to Admission medications   Medication Sig Start Date End Date Taking? Authorizing Provider  acetaminophen (TYLENOL) 650 MG CR tablet Take 1,300 mg by mouth every 8 (eight) hours as needed for pain.   Yes [provider]  ALPRAZolam Duanne Moron) 1 MG tablet Take 1 mg by mouth 3 (three) times daily as needed for anxiety. 04/20/15  Yes [provider]  Ascorbic Acid (VITAMIN C) 1000 MG tablet Take 1,000 mg by mouth daily.   Yes [provider]  aspirin EC 81 MG tablet Take 81 mg by mouth daily.   Yes [provider]  atorvastatin (LIPITOR) 10 MG tablet Take 10 mg by mouth at bedtime.    Yes [provider]  clopidogrel (PLAVIX) 75 MG tablet Take 1 tablet (75 mg total) by mouth daily. 05/14/22  Yes Thurnell Lose, MD  esomeprazole (NEXIUM) 40 MG  capsule Take 40 mg by mouth daily as needed (for heartburn).    Yes [provider]  folic acid (FOLVITE) 1 MG tablet Take 1 tablet (1 mg total) by mouth daily. 04/18/22  Yes Shawna Clamp, MD  furosemide (LASIX) 40 MG tablet Take 1 tablet (40 mg total) by mouth daily. 05/16/22 06/15/22 Yes Thurnell Lose, MD  guaiFENesin (MUCINEX) 600 MG 12 hr tablet Take 600 mg by mouth 2 (two) times daily.   Yes [provider]  ipratropium-albuterol (DUONEB) 0.5-2.5 (3) MG/3ML SOLN Take 3 mLs by nebulization 2 (two) times daily.   Yes [provider]  lidocaine-prilocaine (EMLA) cream Apply 1 Application topically as needed. 05/06/22  Yes Heilingoetter, Cassandra L, PA-C  metoprolol succinate (TOPROL XL) 25 MG 24 hr tablet Take 1 tablet (25 mg total) by mouth daily. 04/27/22  Yes Loel Dubonnet, NP  Omega-3 Fatty Acids ([employer] FISH OIL) 1000 MG CAPS Take 1,000 mg by mouth every evening.   Yes [provider]  oxyCODONE-acetaminophen (PERCOCET) 10-325 MG tablet Take 1 tablet by mouth 5 (five) times daily as needed for pain. 05/04/21  Yes [provider]  potassium chloride (KLOR-CON M) 10 MEQ tablet Take 1 tablet (10 mEq total) by mouth daily. 05/16/22  Yes Thurnell Lose, MD  pregabalin (LYRICA) 75 MG capsule Take 75 mg by mouth 2 (two) times daily. 02/22/22  Yes [provider]  thiamine (VITAMIN B-1) 100 MG tablet Take 1 tablet (100 mg total) by mouth daily. 04/18/22  Yes Shawna Clamp, MD  vitamin B-12 (CYANOCOBALAMIN) 1000 MCG tablet Take 1,000 mcg by mouth daily.   Yes [provider]     Critical care time:   This patient is critically ill with multiple organ system failure; which, requires frequent high complexity decision making, assessment, support, evaluation, and titration of therapies. This was completed through the application of advanced  monitoring technologies and extensive interpretation of multiple databases. During this  encounter critical care time was devoted to patient care services described in this note for 33 minutes.    [facility] 06/07/2022 9:18 AM

## 2022-06-07 NOTE — Progress Notes (Signed)
[facility]                                                                               Benjamin Case, is a 67 y.o. male, DOB - [birth year], XBM:[id number] Admit date - 05/13/2022    Outpatient Primary MD for the patient is [name], MD  LOS - 4  days    Brief summary   67 year old white male stage IV metastatic lung CA,  L pneumonectomy 2009 adjuvant chemo 2009-started on 3 cycles of  palliative therapy, discontinued secondary to progression, presents with melanotic stools and generalized weakness. He was admitted for GI bleed and DAPT discontinued. GI consulted, recommended outpatient EGD.   Assessment & Plan    Assessment and Plan:    Acute  on chronic respiratory failure with hypoxia and hypercapnia in the setting of stage 4 metastatic lung ca; ABG this am shows respiratory acidosis with hypercapnia. Start him on BIPAP, repeat CXR shows possible pneumonia. Left hemithorax is completely opacified. Left lung shows possible bronchopneumonia.  He was transferred to stepdown for closer monitoring.   Suspect from fluid overload due to transfusions./ ? Pneumonia.  He is currently on IV lasix 40 mg daily. PCCM on board.  Continue with strict intake and output. Daily weights.   Metastatic lung cancer : bone , liver, lymph and brain metastasis.   L pneumonectomy 2009 adjuvant chemo 2009-started on 3 cycles of  palliative therapy, discontinued secondary to progression Follows up with Dr Julien Nordmann.    GI bleed secondary ? Upper GI bleed.  Guiac positive stools. Transfuse to keep hemoglobin greater than 8.  Hemoglobin stable around 9.  GI consulted, recommended outpatient EGD.  He is able to tolerate soft diet/ regular diet.    PAF  Rate not well controlled.  Anti coagulation and DAPT held.  Rate control with IV cardizem gtt.  Soft BP parameters,  Keep  k >4 and magnesium greater than 2.  Cardiology on board  and recommendations given   Hypomagnesemia:   Replaced. Recheck in am.    Mild AKI:  Baseline creatinine around 1.2 , worsened to 1.64.  ? Poor oral intake.       Type 2 Diabetes mellitus:  diet controlled.  CBG (last 3)  Recent Labs    06/07/22 0802  GLUCAP 173*     Lower back pain sec to prior hemilaminectomy:  Pain control and therapy evaluations will be ordered.    Leukocytosis and thrombocytosis:  ? Reactive.     Estimated body mass index is 24.01 kg/m as calculated from the following:   Height as of this encounter: 5\' 10"  (1.778 m).   Weight as of this encounter: 75.9 kg.  Code Status: DNR DVT Prophylaxis:  SCDs Start: 05/07/2022 1812   Level of Care: Level of care: Stepdown Family Communication: family at bedside.   Disposition Plan:     Remains inpatient appropriate:  ON bipapa.   Procedures:  None.   Consultants:   None.   Antimicrobials:   Anti-infectives (From admission, onward)    Start     Dose/Rate Route Frequency Ordered Stop   06/07/22 1000  azithromycin (ZITHROMAX) 250 mg in dextrose  5 % 125 mL IVPB        250 mg 127.5 mL/hr over 60 Minutes Intravenous Every 24 hours 06/07/22 0914 06/12/22 0959        Medications  Scheduled Meds:  albuterol  2.5 mg Nebulization Q6H WA   arformoterol  15 mcg Nebulization BID   vitamin C  1,000 mg Oral Daily   atorvastatin  10 mg Oral QHS   budesonide (PULMICORT) nebulizer solution  0.25 mg Nebulization BID   calcium carbonate  1 tablet Oral BID   Chlorhexidine Gluconate Cloth  6 each Topical Daily   cyanocobalamin  1,000 mcg Oral Daily   folic acid  1 mg Oral Daily   guaiFENesin  600 mg Oral BID   methylPREDNISolone (SOLU-MEDROL) injection  125 mg Intravenous Once   Followed by   Derrill Memo ON 06/08/2022] methylPREDNISolone (SOLU-MEDROL) injection  40 mg Intravenous Q24H   metoprolol tartrate  25 mg Oral BID   omega-3 acid ethyl esters  1 g Oral QPM   mouth rinse  15 mL Mouth Rinse 4 times per day   pantoprazole  40 mg Oral BID    pregabalin  75 mg Oral BID   revefenacin  175 mcg Nebulization Daily   thiamine  100 mg Oral Daily   Continuous Infusions:  azithromycin     diltiazem (CARDIZEM) infusion 10 mg/hr (06/07/22 0341)   PRN Meds:.ALPRAZolam, levalbuterol, ondansetron **OR** ondansetron (ZOFRAN) IV, mouth rinse, oxyCODONE-acetaminophen **AND** oxyCODONE    Subjective:   Benjamin Case was seen and examined today.   Ill appearing in distress.   Objective:   Vitals:   06/07/22 0730 06/07/22 0742 06/07/22 0800 06/07/22 0900  BP:   137/86 (!) 132/109  Pulse:  (!) 108 (!) 106 (!) 105  Resp:  (!) 23 (!) 24 (!) 21  Temp:      TempSrc:      SpO2: (!) 82% 92% 92% 92%  Weight:      Height:        Intake/Output Summary (Last 24 hours) at 06/07/2022 0925 Last data filed at 06/06/2022 1900 Gross per 24 hour  Intake 0 ml  Output 800 ml  Net -800 ml    Filed Weights   06/05/22 0500 06/06/22 0441 06/07/22 0500  Weight: 75.8 kg 73.1 kg 75.9 kg     Exam General exam: Ill appearing gentleman, in distress from sob. Was on 10 lit , currently on BIPAP.  Respiratory system:diminished air entry at bases.  Cardiovascular system: S1 & S2 heard, RRR. No JVD,  Gastrointestinal system: Abdomen is nondistended, soft and nontender.  Central nervous system: Alert and oriented. Grossly non focal.  Extremities: no cyanosis.  Skin: No rashes,  Psychiatry: unable to assess.      Data Reviewed:  I have personally reviewed following labs and imaging studies   CBC Lab Results  Component Value Date   WBC 18.9 (H) 06/07/2022   RBC 3.40 (L) 06/07/2022   HGB 10.4 (L) 06/07/2022   HCT 33.9 (L) 06/07/2022   MCV 99.7 06/07/2022   MCH 30.6 06/07/2022   PLT 587 (H) 06/07/2022   MCHC 30.7 06/07/2022   RDW 16.0 (H) 06/07/2022   LYMPHSABS 0.9 06/07/2022   MONOABS 1.3 (H) 06/07/2022   EOSABS 0.4 06/07/2022   BASOSABS 0.1 62/95/2841     Last metabolic panel Lab Results  Component Value Date   NA 134 (L)  06/07/2022   K 4.2 06/07/2022   CL 92 (L) 06/07/2022   CO2 33 (  H) 06/07/2022   BUN 31 (H) 06/07/2022   CREATININE 1.64 (H) 06/07/2022   GLUCOSE 198 (H) 06/07/2022   GFRNONAA 45 (L) 06/07/2022   GFRAA >60 12/11/2018   CALCIUM 8.6 (L) 06/07/2022   PHOS 6.0 (H) 06/07/2022   PROT 6.6 06/06/2022   ALBUMIN 3.4 (L) 06/06/2022   BILITOT 0.8 06/06/2022   ALKPHOS 66 06/06/2022   AST 28 06/06/2022   ALT 25 06/06/2022   ANIONGAP 9 06/07/2022    CBG (last 3)  Recent Labs    06/07/22 0802  GLUCAP 173*      Coagulation Profile: Recent Labs  Lab 05/14/2022 1315  INR 1.1      Radiology Studies: DG CHEST PORT 1 VIEW  Result Date: 06/07/2022 CLINICAL DATA:  67 year old male with history of dyspnea. Increasing shortness of breath. EXAM: PORTABLE CHEST 1 VIEW COMPARISON:  Chest x-ray 06/04/2022. FINDINGS: Complete opacification of the left hemithorax again noted, similar to prior studies, related to prior left-sided pneumonectomy. Diffuse interstitial prominence and patchy ill-defined airspace opacities throughout the right lung, most evident in the right mid to lower lung. Small right pleural effusion. No pneumothorax. Cardiomediastinal structures are completely obscured. Atherosclerotic calcifications in the thoracic aorta. IMPRESSION: 1. The appearance of the right lung is concerning for bronchitis and probable bronchopneumonia, as above. 2. Status post left pneumonectomy. 3. Aortic atherosclerosis. Electronically Signed   By: Vinnie Langton M.D.   On: 06/07/2022 08:32    Time spent: 40 minutes.    Hosie Poisson M.D. [facility] 06/07/2022, 9:25 AM  Available via Epic secure chat 7am-7pm After 7 pm, please refer to night coverage provider listed on amion.

## 2022-06-07 NOTE — Consult Note (Signed)
Cardiology Consultation   Patient ID: Benjamin Case MRN: [id number]; DOB: 1955/07/08  Admit date: 05/18/2022 Date of Consult: 06/07/2022  PCP:  [name], [place] [facility] Cardiologist:  Buford Dresser, MD   {     Patient Profile:   Benjamin Case is a 67 y.o. male with a hx of metastatic lung cancer, CVA, COPD, diabetes mellitus, hypertension, paroxysmal atrial fibrillation admitted with GI bleed who is being seen 06/07/2022 for the evaluation of atrial fibrillation at the request of Hosie Poisson MD.  History of Present Illness:   Last echocardiogram November 2023 showed ejection fraction 55 to 60%, severe right ventricular enlargement, moderate pulmonary hypertension, severe right atrial enlargement, mild to moderate tricuspid regurgitation.  Patient has known paroxysmal atrial fibrillation and last saw Dr. Harrell Gave May 07, 2022.  He also has a history of embolic CVA.  However he has had GI bleeding with anticoagulation in the past.  Long discussion at last office visit concerning risks and benefits of anticoagulation.  Patient elected not to pursue anticoagulation at that point.  He was on aspirin and Plavix.  Patient was admitted December 28 with GI bleed and weakness.  Initial hemoglobin 5.8.  Patient was transfused and aspirin/Plavix held.  He has been seen by gastroenterology and outpatient EGD has been planned.  He was noted to be in atrial fibrillation with elevated heart rate and cardiology asked to evaluate.  At time of my evaluation patient appears dyspneic and notes some dyspnea.  He denies chest pain, palpitations or syncope.   Past Medical History:  Diagnosis Date   Arthritis    back   Atrial fibrillation (HCC)    COPD (chronic obstructive pulmonary disease) (HCC)    Diabetes mellitus without complication (HCC)    no medications , monitors cbg at home with monitor, avg no more than 150s at home    Dyspnea    increased exertion;  pt states can climb flight of stairs w/o having to stop prior to reaching top ; ongoing reports it is due to "having one lung"    GERD (gastroesophageal reflux disease)    Hepatitis    hepatitis C; pt states competed treatments, unsure what medication he was treated with    History of chemotherapy    Hypertension    reports its been over a year since he stopped taking bp medication, reports he made her medical doctor aware that he was topping it    lung ca dx'd 12/2007   lt lobectomy   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   and back - see note in [facility] 12/26/21   Numbness    FEET BILATERAL   Pneumonia 2009   Right upper lobe pulmonary nodule 04/29/2021   Rotator cuff tear    right     Past Surgical History:  Procedure Laterality Date   ARTHROSCOPIC REPAIR ACL Right    BRONCHIAL BIOPSY  06/09/2021   Procedure: BRONCHIAL BIOPSIES;  Surgeon: Garner Nash, DO;  Location: [facility];  Service: Pulmonary;;   BRONCHIAL BRUSHINGS  06/09/2021   Procedure: BRONCHIAL BRUSHINGS;  Surgeon: Garner Nash, DO;  Location: [facility];  Service: Pulmonary;;   BRONCHIAL WASHINGS  06/09/2021   Procedure: BRONCHIAL WASHINGS;  Surgeon: Garner Nash, DO;  Location: [place];  Service: Pulmonary;;   CATARACT EXTRACTION, BILATERAL  2019   DECOMPRESSIVE LUMBAR LAMINECTOMY LEVEL 1 N/A 03/24/2016   Procedure: L3-4 CENTRAL DECOMPRESSION LUMBER LAMINECTOMY;  Surgeon: Latanya Maudlin, MD;  Location: [facility];  Service: Orthopedics;  Laterality: N/A;   FINE NEEDLE ASPIRATION  03/23/2022   Procedure: FINE NEEDLE ASPIRATION (FNA) LINEAR;  Surgeon: Garner Nash, DO;  Location: [place];  Service: Pulmonary;;   FOOT SURGERY Bilateral    HAND SURGERY Bilateral    LOBECTOMY Left 2009   pneumonectomy Left 2009   [name]   SHOULDER ARTHROSCOPY WITH DISTAL CLAVICLE RESECTION Right 12/14/2018   Procedure: SHOULDER ARTHROSCOPY WITH DISTAL CLAVICLE RESECTION;  Surgeon: Tania Ade, MD;  Location: [facility];  Service: Orthopedics;  Laterality: Right;   SHOULDER ARTHROSCOPY WITH ROTATOR CUFF REPAIR Right 12/14/2018   Procedure: SHOULDER ARTHROSCOPY WITH ROTATOR CUFF REPAIR;  Surgeon: Tania Ade, MD;  Location: [facility];  Service: Orthopedics;  Laterality: Right;   SHOULDER ARTHROSCOPY WITH SUBACROMIAL DECOMPRESSION Right 12/14/2018   Procedure: SHOULDER ARTHROSCOPY WITH SUBACROMIAL DECOMPRESSION;  Surgeon: Tania Ade, MD;  Location: [facility];  Service: Orthopedics;  Laterality: Right;   TRIGGER FINGER RELEASE     right hand, pointer finger    VIDEO BRONCHOSCOPY WITH ENDOBRONCHIAL ULTRASOUND Right 03/23/2022   Procedure: VIDEO BRONCHOSCOPY WITH ENDOBRONCHIAL ULTRASOUND;  Surgeon: Garner Nash, DO;  Location: [place];  Service: Pulmonary;  Laterality: Right;   VIDEO BRONCHOSCOPY WITH RADIAL ENDOBRONCHIAL ULTRASOUND  06/09/2021   Procedure: VIDEO BRONCHOSCOPY WITH RADIAL ENDOBRONCHIAL ULTRASOUND;  Surgeon: Garner Nash, DO;  Location: [facility];  Service: Pulmonary;;     Inpatient Medications: Scheduled Meds:  vitamin C  1,000 mg Oral Daily   atorvastatin  10 mg Oral QHS   calcium carbonate  1 tablet Oral BID   cyanocobalamin  1,000 mcg Oral Daily   folic acid  1 mg Oral Daily   guaiFENesin  600 mg Oral BID   metoprolol tartrate  25 mg Oral BID   omega-3 acid ethyl esters  1 g Oral QPM   pantoprazole  40 mg Oral BID   pregabalin  75 mg Oral BID   thiamine  100 mg Oral Daily   Continuous Infusions:  diltiazem (CARDIZEM) infusion 10 mg/hr (06/07/22 0341)   PRN Meds: ALPRAZolam, ipratropium, levalbuterol, ondansetron **OR** ondansetron (ZOFRAN) IV, oxyCODONE-acetaminophen **AND** oxyCODONE  Allergies:   No Known Allergies  Social History:   Social History   Socioeconomic History   Marital status: Married    Spouse name: Vaughan Basta   Number of children: 2   Years of education: 11   Highest education level: Not on file  Occupational History    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, Probation officer  Tobacco Use   Smoking status: Every Day    Packs/day: 0.50    Years: 45.00    Total pack years: 22.50    Types: Cigarettes   Smokeless tobacco: Never   Tobacco comments:    08/04/16 3-6 cigs daily 02/22/2022 [place]    1/2 -3/4 ppd as of 03/22/22  Vaping Use   Vaping Use: Never used  Substance and Sexual Activity   Alcohol use: Yes    Comment: whiskey 4 drinks/day   Drug use: No   Sexual activity: Yes  Other Topics Concern   Not on file  Social History Narrative   Lives with wife   Caffeine - coffee, 1 cup daily   Social Determinants of Health   Financial Resource Strain: Not on file  Food Insecurity: No Food Insecurity (06/02/2022)   Hunger Vital Sign    Worried About Running Out of Food in the Last Year: Never true    Ran Out of  Food in the Last Year: Never true  Transportation Needs: No Transportation Needs (05/19/2022)   PRAPARE - Hydrologist (Medical): No    Lack of Transportation (Non-Medical): No  Physical Activity: Not on file  Stress: Not on file  Social Connections: Not on file  Intimate Partner Violence: Not At Risk (06/04/2022)   Humiliation, Afraid, Rape, and Kick questionnaire    Fear of Current or Ex-Partner: No    Emotionally Abused: No    Physically Abused: No    Sexually Abused: No    Family History:    Family History  Problem Relation Age of Onset   Cancer Mother      ROS:  Please see the history of present illness.  Generalized weakness and melena.  Also dyspneic. All other ROS reviewed and negative.     Physical Exam/Data:   Vitals:   06/07/22 0403 06/07/22 0425 06/07/22 0500 06/07/22 0600  BP:    111/70  Pulse:  (!) 111  (!) 103  Resp:  (!) 25  19  Temp: 97.6 F (36.4 C)     TempSrc: Oral     SpO2: 92% 91%  90%  Weight:   75.9 kg   Height:        Intake/Output Summary (Last 24 hours) at 06/07/2022 0724 Last data filed at 06/06/2022 1900 Gross per 24 hour  Intake 100 ml   Output 800 ml  Net -700 ml      06/07/2022    5:00 AM 06/06/2022    4:41 AM 06/05/2022    5:00 AM  Last 3 Weights  Weight (lbs) 167 lb 5.3 oz 161 lb 1.6 oz 167 lb  Weight (kg) 75.9 kg 73.074 kg 75.751 kg     Body mass index is 24.01 kg/m.  General:  Well developed, chronically ill-appearing, in mild respiratory distress HEENT: normal Neck: no JVD Vascular: No carotid bruits; Distal pulses 2+ bilaterally Cardiac:  normal S1, S2; regular and tachycardic Lungs: With diminished breath sounds throughout left greater than right Abd: soft, nontender, no hepatomegaly  Ext: no edema Musculoskeletal:  No deformities, BUE and BLE strength normal and equal Skin: warm and dry  Neuro:  CNs 2-12 intact, no focal abnormalities noted Psych:  Normal affect   EKG:  The EKG was personally reviewed and demonstrates: Sinus tachycardia with PACs and nonspecific ST changes.  Follow-up ECG shows atrial fibrillation with rapid ventricular response. Telemetry:  Telemetry was personally reviewed and demonstrates: This morning patient is in sinus tachycardia.  Laboratory Data:  Chemistry Recent Labs  Lab 06/04/22 0104 06/06/22 0418 06/07/22 0423  NA 136 135 134*  K 3.7 3.6 4.2  CL 99 91* 92*  CO2 28 33* 33*  GLUCOSE 118* 159* 198*  BUN 26* 26* 31*  CREATININE 1.26* 1.47* 1.64*  CALCIUM 7.9* 8.4* 8.6*  MG 1.2* 1.5* 1.7  GFRNONAA >60 52* 45*  ANIONGAP 9 11 9     Recent Labs  Lab 05/12/2022 1315 06/04/22 0104 06/06/22 0418  PROT 6.6 6.0* 6.6  ALBUMIN 3.2* 3.1* 3.4*  AST 32 29 28  ALT 34 28 25  ALKPHOS 65 63 66  BILITOT 0.6 0.6 0.8   Hematology Recent Labs  Lab 06/05/22 0530 06/06/22 0418 06/07/22 0423  WBC 18.5* 20.2* 18.9*  RBC 3.02* 3.05* 3.40*  HGB 9.5* 9.4* 10.4*  HCT 29.7* 29.9* 33.9*  MCV 98.3 98.0 99.7  MCH 31.5 30.8 30.6  MCHC 32.0 31.4 30.7  RDW 17.4* 16.5* 16.0*  PLT 477* 518* 587*     Radiology/Studies:  DG CHEST PORT 1 VIEW  Result Date:  06/04/2022 CLINICAL DATA:  Shortness of breath. EXAM: PORTABLE CHEST 1 VIEW COMPARISON:  Chest radiograph dated 05/20/2022. FINDINGS: Similar appearance of complete opacification of the left hemithorax with volume loss and shift of the mediastinum into the left hemithorax. Diffuse right lung interstitial densities which may represent edema, or pneumonia. No pneumothorax. Atherosclerotic calcification of the aorta. No acute osseous pathology. IMPRESSION: No significant interval change. Electronically Signed   By: Anner Crete M.D.   On: 06/04/2022 02:28   US RENAL  Result Date: 05/31/2022 CLINICAL DATA:  Acute kidney injury EXAM: RENAL / URINARY TRACT ULTRASOUND COMPLETE COMPARISON:  05/13/2022 FINDINGS: Right Kidney: Renal measurements: 10.1 x 4.4 x 5.4 cm = volume: 125 mL. Echogenicity within normal limits. No mass, shadowing stone, or hydronephrosis visualized. Left Kidney: Renal measurements: 10.8 x 5.7 x 5.2 cm = volume: 167 mL. Echogenicity within normal limits. 2.9 cm simple cyst in the lower pole of the left kidney. No solid mass, shadowing stone, or hydronephrosis visualized. Bladder: Appears normal for degree of bladder distention. Other: Numerous hypoechoic liver masses.  Right-sided pleural effusion. IMPRESSION: 1. No evidence of obstructive uropathy. 2. Numerous hypoechoic liver masses, concerning for metastatic disease. 3. Right-sided pleural effusion. Electronically Signed   By: Davina Poke D.O.   On: 05/23/2022 19:07     Assessment and Plan:   Paroxysmal atrial fibrillation-patient is back in sinus rhythm this morning.  He is on IV Cardizem at 10 mg/h and metoprolol 25 mg twice daily.  Can transition to Cardizem 60 mg by mouth every 6 hours and follow blood pressure.  At present his blood pressure appears to be tolerating this.  Some of elevated heart rate is likely driven by his GI bleed/anemia and ongoing hypoxia from previous lung resection/lung cancer.  His CHA2DS2-VASc is at  least 5.  However he is not a candidate for anticoagulation at this point given recent GI bleed.  I would also avoid aspirin and Plavix at this point given active GI bleed.  He understands there is a higher risk of CVA off of anticoagulation but at this point there is no option. GI bleed-hemoglobin has improved with transfusion.  Gastroenterology plans EGD either tomorrow or as an outpatient.  Continue Protonix.  Follow hemoglobin (10.4 this morning). Metastatic lung cancer-managed by oncology. Respiratory failure-patient's saturations are low this morning.  Likely secondary to previous lung resection, metastatic lung cancer and COPD.  Per primary care.  Prognosis appears to be poor.  Would again discuss palliative care with patient and wife.     For questions or updates, please contact [place] Please consult [email or web address] for contact info under    Signed, Kirk Ruths, MD  06/07/2022 7:24 AM

## 2022-06-07 NOTE — Consult Note (Signed)
Consultation Note Date: 06/07/2022   Patient Name: [name] Case  DOB: 07-Jan-1956  MRN: [id number]  Age / Sex: 67 y.o., male   PCP: [name], MD Referring Physician: Hosie Poisson, MD  Reason for Consultation: Establishing goals of care     Chief Complaint/History of Present Illness:   Patient is a 67 year old male with a past medical history of stage IV metastatic lung cancer with mets to bone, liver, and brain, and left pneumonectomy in 2009 who is receiving palliative chemo until this was discontinued secondary to progression who was admitted on 05/12/2022 for management of melanotic stools and generalized weakness.  During admission.  Patient has been managed for acute on chronic respiratory failure with hypoxia and hypercapnia in the setting of lung cancer and imaging showing bronchopneumonia.  Patient has also had difficulties with rate control in setting of A-fib with RVR.  GI evaluated patient due to upper GI bleed.  Rapid response called on 1/1 with deterioration of respiratory status and patient to ICU for management.  Palliative medicine team consulted to assist with complex medical decision making. Of note, patient seen by palliative medicine team in December/2023.  Extensive review of EMR prior to seeing patient.  Patient has continued to develop hypercapnia despite use of BiPAP.  Patient also developing worsening renal failure.  Discussed care with bedside RN for updates hard to presenting to bedside.  ------------------------------------------------------------------------------------------------------------- Advance Care Planning Conversation  Pertinent diagnosis: Stage IV metastatic lung cancer, left pneumonectomy, bronchopneumonia, acute on chronic hypoxic and hypercapnic respiratory failure, worsening renal failure  The patient and/or family consented to a voluntary Advance Care Planning Conversation. Individuals present for the conversation: Unable to participate in  complex medical decision making.  Spoke with next of kin including wife and children.  Summary of the conversation:  Presented to bedside and introduced myself and the role of the palliative medicine team to family.  Patient unable to participate in complex medical decision making due to his altered mental status.  Patient is lying in bed and lethargic.  Patient has noted increased work of breathing with accessory muscle use.    Spoke with family members at bedside which included patient's wife and 3 out of 4 of the children.  Family able to update that they had heard patient's deterioration from a respiratory status.  Able to further detail this and answer questions as able.  Then able to discuss in setting of patient's continued deterioration, pathways for medical care moving forward.  Described that despite extensive aggressive medical measures, patient has continued to deteriorate.  Discussed transitioning to comfort focused care as well.  Described what would and would not be detailed in transition to comfort focused care.  Described discontinuation of interventions that are no longer focused on patient's comfort such as IV fluids, medications not for comfort, and BiPAP.  Discussed instead transitioning to focusing on patient's pain, dyspnea, and agitation.  Discussed prognosis and respiratory failure could very though based on his increased hypercapnia and discontinue use of BiPAP, could be hours to days.  Family expressed agreeing with transition to comfort focused care at this time and that the priority is that the patient "does not suffer".  Described in detail initiating medications for comfort.  Outcome of the conversations and/or documents completed:  Transitioning to comfort focused care at this time.  I spent 28 minutes providing separately identifiable ACP services with the patient and/or surrogate decision maker in a voluntary, in-person conversation discussing the patient's wishes and  goals as  detailed in the above note.  Chelsea Aus, DO Palliative Care Provider  -------------------------------------------------------------------------------------------------------------  Thanked family for their time and advocating for patient.  Noted that if they feel that symptoms are uncontrolled, please inform staff so that medications can be further adjusted.  Spent time providing emotional support via active listening.  All questions answered at that time.  Thanked family for allowing me to meet with them today.  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  Patient appears to have increased work of breathing with accessory muscle use while lying in bed.  Past Medical History:  Diagnosis Date   Arthritis    back   Atrial fibrillation (HCC)    COPD (chronic obstructive pulmonary disease) (HCC)    CVA (cerebral vascular accident) ([place])    Diabetes mellitus without complication ([place])    no medications , monitors cbg at home with monitor, avg no more than 150s at home    Dyspnea    increased exertion; pt states can climb flight of stairs w/o having to stop prior to reaching top ; ongoing reports it is due to "having one lung"    GERD (gastroesophageal reflux disease)    Hepatitis    hepatitis C; pt states competed treatments, unsure what medication he was treated with    History of chemotherapy    Hypertension    reports its been over a year since he stopped taking bp medication, reports he made her medical doctor aware that he was topping it    lung ca dx'd 12/2007   lt lobectomy   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   and back - see note in CE 12/26/21   Numbness    FEET BILATERAL   Pneumonia 2009   Right upper lobe pulmonary nodule  04/29/2021   Rotator cuff tear    right    Social History   Socioeconomic History   Marital status: Married    Spouse name: Benjamin Case   Number of children: 2   Years of education: 11   Highest education level: Not on file  Occupational History    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, Probation officer  Tobacco Use   Smoking status: Every Day    Packs/day: 0.50    Years: 45.00    Total pack years: 22.50    Types: Cigarettes   Smokeless tobacco: Never   Tobacco comments:    08/04/16 3-6 cigs daily 02/22/2022 [place]    1/2 -3/4 ppd as of 03/22/22  Vaping Use   Vaping Use: Never used  Substance and Sexual Activity   Alcohol use: Yes    Comment: whiskey 4 drinks/day   Drug use: No   Sexual activity: Yes  Other Topics Concern   Not on file  Social History Narrative   Lives with wife   Caffeine - coffee, 1 cup daily   Social Determinants of Health   Financial Resource Strain: Not on file  Food Insecurity: No Food Insecurity (06/05/2022)   Hunger Vital Sign    Worried About Running Out of Food in the Last Year: Never true    Ran Out of Food in the Last Year: Never true  Transportation Needs: No Transportation Needs (05/21/2022)   PRAPARE - Hydrologist (Medical): No  Lack of Transportation (Non-Medical): No  Physical Activity: Not on file  Stress: Not on file  Social Connections: Not on file   Family History  Problem Relation Age of Onset   Cancer Mother    Scheduled Meds:  albuterol  2.5 mg Nebulization Q6H WA   arformoterol  15 mcg Nebulization BID   vitamin C  1,000 mg Oral Daily   atorvastatin  10 mg Oral QHS   budesonide (PULMICORT) nebulizer solution  0.25 mg Nebulization BID   calcium carbonate  1 tablet Oral BID   Chlorhexidine Gluconate Cloth  6 each Topical Daily   cyanocobalamin  1,000 mcg Oral Daily   folic acid  1 mg Oral Daily   guaiFENesin  600 mg Oral BID   [START ON 06/08/2022] methylPREDNISolone (SOLU-MEDROL) injection  40 mg Intravenous  Q24H   metoprolol tartrate  25 mg Oral BID   omega-3 acid ethyl esters  1 g Oral QPM   mouth rinse  15 mL Mouth Rinse 4 times per day   pantoprazole  40 mg Oral BID   pregabalin  75 mg Oral BID   revefenacin  175 mcg Nebulization Daily   thiamine  100 mg Oral Daily   Continuous Infusions:  azithromycin Stopped (06/07/22 1257)   diltiazem (CARDIZEM) infusion 10 mg/hr (06/07/22 1000)   PRN Meds:.ALPRAZolam, levalbuterol, ondansetron **OR** ondansetron (ZOFRAN) IV, mouth rinse, oxyCODONE-acetaminophen **AND** oxyCODONE No Known Allergies CBC:    Component Value Date/Time   WBC 18.9 (H) 06/07/2022 0423   HGB 10.4 (L) 06/07/2022 0423   HGB 10.6 (L) 05/27/2022 0809   HGB 16.4 04/26/2016 1205   HCT 33.9 (L) 06/07/2022 0423   HCT 49.6 04/26/2016 1205   PLT 587 (H) 06/07/2022 0423   PLT 460 (H) 05/27/2022 0809   PLT 346 04/26/2016 1205   MCV 99.7 06/07/2022 0423   MCV 105.2 (H) 04/26/2016 1205   NEUTROABS 16.1 (H) 06/07/2022 0423   NEUTROABS 8.0 (H) 04/26/2016 1205   LYMPHSABS 0.9 06/07/2022 0423   LYMPHSABS 2.0 04/26/2016 1205   MONOABS 1.3 (H) 06/07/2022 0423   MONOABS 0.9 04/26/2016 1205   EOSABS 0.4 06/07/2022 0423   EOSABS 0.2 04/26/2016 1205   BASOSABS 0.1 06/07/2022 0423   BASOSABS 0.1 04/26/2016 1205   Comprehensive Metabolic Panel:    Component Value Date/Time   NA 134 (L) 06/07/2022 0423   NA 138 04/26/2016 1205   K 4.2 06/07/2022 0423   K 5.0 04/26/2016 1205   CL 92 (L) 06/07/2022 0423   CL 103 04/27/2012 1253   CO2 33 (H) 06/07/2022 0423   CO2 30 (H) 04/26/2016 1205   BUN 31 (H) 06/07/2022 0423   BUN 6.0 (L) 04/26/2016 1205   CREATININE 1.64 (H) 06/07/2022 0423   CREATININE 1.25 (H) 05/27/2022 0809   CREATININE 1.3 04/26/2016 1205   GLUCOSE 198 (H) 06/07/2022 0423   GLUCOSE 127 04/26/2016 1205   GLUCOSE 114 (H) 04/27/2012 1253   CALCIUM 8.6 (L) 06/07/2022 0423   CALCIUM 9.8 04/26/2016 1205   AST 28 06/06/2022 0418   AST 25 05/27/2022 0809   AST 40 (H)  04/26/2016 1205   ALT 25 06/06/2022 0418   ALT 14 05/27/2022 0809   ALT 25 04/26/2016 1205   ALKPHOS 66 06/06/2022 0418   ALKPHOS 60 04/26/2016 1205   BILITOT 0.8 06/06/2022 0418   BILITOT 0.3 05/27/2022 0809   BILITOT 0.47 04/26/2016 1205   PROT 6.6 06/06/2022 0418   PROT 7.1 04/26/2016 1205   ALBUMIN  3.4 (L) 06/06/2022 0418   ALBUMIN 3.7 04/26/2016 1205    Physical Exam: Vital Signs: BP (!) 111/52   Pulse (!) 101   Temp 98.3 F (36.8 C) (Axillary)   Resp (!) 23   Ht 5\' 10"  (1.778 m)   Wt 75.9 kg   SpO2 93%   BMI 24.01 kg/m  SpO2: SpO2: 93 % O2 Device: O2 Device: Nasal Cannula O2 Flow Rate: O2 Flow Rate (L/min): 8 L/min Intake/output summary:  Intake/Output Summary (Last 24 hours) at 06/07/2022 1444 Last data filed at 06/07/2022 1000 Gross per 24 hour  Intake 278.01 ml  Output 800 ml  Net -521.99 ml   LBM: Last BM Date :  (prior to transfer) Baseline Weight: Weight: 76.6 kg Most recent weight: Weight: 75.9 kg  General: Lethargic, ill-appearing, not able to participate in conversation  Eyes: No drainage noted HENT: Dry mucous membranes Cardiovascular: Tachycardia noted Respiratory: Increased work of breathing noted on nasal cannula support with accessory muscle use noted bilaterally Skin: Pale Neuro: Lethargic          Palliative Performance Scale: 10%               Additional Data Reviewed: Recent Labs    06/06/22 0418 06/07/22 0423  WBC 20.2* 18.9*  HGB 9.4* 10.4*  PLT 518* 587*  NA 135 134*  BUN 26* 31*  CREATININE 1.47* 1.64*    Imaging: DG CHEST PORT 1 VIEW CLINICAL DATA:  67 year old male with history of dyspnea. Increasing shortness of breath.  EXAM: PORTABLE CHEST 1 VIEW  COMPARISON:  Chest x-ray 06/04/2022.  FINDINGS: Complete opacification of the left hemithorax again noted, similar to prior studies, related to prior left-sided pneumonectomy. Diffuse interstitial prominence and patchy ill-defined airspace opacities throughout the  right lung, most evident in the right mid to lower lung. Small right pleural effusion. No pneumothorax. Cardiomediastinal structures are completely obscured. Atherosclerotic calcifications in the thoracic aorta.  IMPRESSION: 1. The appearance of the right lung is concerning for bronchitis and probable bronchopneumonia, as above. 2. Status post left pneumonectomy. 3. Aortic atherosclerosis.  Electronically Signed   By: Vinnie Langton M.D.   On: 06/07/2022 08:32    I personally reviewed recent imaging.   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  During admission.  Patient has been managed for acute on chronic respiratory failure with hypoxia and hypercapnia in the setting of lung cancer and imaging showing bronchopneumonia.  Patient has also had difficulties with rate control in setting of A-fib with RVR.  GI evaluated patient due to upper GI bleed.  Rapid response called on 1/1 with deterioration of respiratory status and patient to ICU for management.  Palliative medicine team consulted to assist with complex medical decision making. Of note, patient seen by palliative medicine team in December/2023.  # Complex medical decision making/goals of care  -Patient unable to participate in medical decision making secondary to his medical status.  -Spoke with notable family members at bedside including patient's wife and children.  Discussed possible pathways moving forward.  Family agreeing with transition to comfort focused care at this time.  Family's priority is  that patient "does not  suffer".  -At this time we will discontinue interventions that are no longer focused on comfort such as BiPAP, IV fluids, imaging, or lab work.  Will instead focus on symptom management of pain, dyspnea, and agitation in the setting of end-of-life care.  - Code Status: DNR   # Symptom management    -Pain/Dyspnea, acute on chronic in the setting of stage IV lung cancer and end-of-life care                Patient had previously been receiving as needed oxycodone 5 mg.  Patient had received total of 20 mg of oxycodone in the past 24 hours at time of EMR review.  Patient already had noted work of breathing when seen and this will likely continue to worsen due to patient's underlying respiratory disease.   -Start IV Dilaudid 0.3 mg/h continuous basal infusion.  Start IV Dilaudid 0.5 mg bolus every 30 minutes as needed. Continue to adjust based on patient's symptom burden.                   -Anxiety/agitation, in the setting of end-of-life care                               -Started IV Ativan 0.5 mg every 4 hours as needed. Continue to adjust based on patient's symptom burden.                                 -Start IV Haldol 0.5 mg every 4 hours as needed. Continue to adjust based on patient's symptom burden.                   -Secretions, in the setting of end-of-life care                               -Started IV glycopyrrolate 0.2 mg every 4 hours as needed.  # Psycho-social/Spiritual Support:  - Support System: Wife, 4 children  # Discharge Planning: Transitioning to comfort focused care at this time.  Patient will likely have in-hospital death due to deteriorating respiratory status in setting of stage IV lung cancer and bronchopneumonia.  Thank you for allowing the palliative care team to participate in the care Archie Endo.  Chelsea Aus, DO Palliative Care Provider PMT # [phone]  If patient remains symptomatic despite maximum doses, please call PMT at [phone] between 0700  and 1900. Outside of these hours, please call attending, as PMT does not have night coverage.

## 2022-06-07 NOTE — Progress Notes (Signed)
PT placed on BiPAP per MD. Tolerating it poorly, takes it off frequently.

## 2022-06-07 NOTE — Plan of Care (Signed)
  Interdisciplinary Goals of Care Family Meeting   Date carried out: 06/07/2022  Location of the meeting: Bedside  Member's involved: Physician, Bedside Registered Nurse, and Family Member or next of kin  Durable Power of Attorney or acting medical decision maker: Met with patient's wife at bedside.   Discussion: We discussed goals of care for Benjamin Case .  Patient and wife are known well to me.  I have cared for several members of the family throughout the years.  The patient Benjamin Case is a longtime cancer patient of mine.  We met at bedside.  They understand that he may very well be at his end-of-life.  He has battled malignancy for some time.  At this point he is in respiratory failure, hypercapnic, metastatic lung cancer, with 1 lung.  He is relatively intolerant of BiPAP but is willing to use it at this time.  Agrees that he would not want to be intubated or placed on mechanical life support.   Code status:   Code Status: DNR   Disposition: Continue current acute care  Time spent for the meeting: 18 mins    [place], DO  06/07/2022, 9:07 AM

## 2022-06-07 NOTE — Progress Notes (Signed)
During report, patient's O2 Sats 82% 6L HFNC. O2 increased to 10L HFNC and Sats improved to 92-94%. Pt continued labored breathing, accessory muscle use. RT notified of increased O2 demands and MD paged. Patient also with new confusion- oriented to self and place, but confused time and situation. Responses delayed, able to follow commands. RR called. Orders placed and patient placed on Bipap per MD Ak[place]. Patient transferred to 1235. Report given to [employer]. Wife at bedside and aware of all changes.

## 2022-06-07 DEATH — deceased

## 2022-06-08 ENCOUNTER — Encounter (HOSPITAL_COMMUNITY): Admission: RE | Payer: Self-pay | Source: Ambulatory Visit

## 2022-06-08 ENCOUNTER — Ambulatory Visit (HOSPITAL_COMMUNITY): Admission: RE | Admit: 2022-06-08 | Payer: Medicare Other | Source: Ambulatory Visit | Admitting: Gastroenterology

## 2022-06-08 DIAGNOSIS — Z8673 Personal history of transient ischemic attack (TIA), and cerebral infarction without residual deficits: Secondary | ICD-10-CM

## 2022-06-08 DIAGNOSIS — Z79899 Other long term (current) drug therapy: Secondary | ICD-10-CM

## 2022-06-08 DIAGNOSIS — J9621 Acute and chronic respiratory failure with hypoxia: Secondary | ICD-10-CM | POA: Diagnosis not present

## 2022-06-08 DIAGNOSIS — J9622 Acute and chronic respiratory failure with hypercapnia: Secondary | ICD-10-CM | POA: Diagnosis not present

## 2022-06-08 DIAGNOSIS — K922 Gastrointestinal hemorrhage, unspecified: Secondary | ICD-10-CM | POA: Diagnosis not present

## 2022-06-08 DIAGNOSIS — N179 Acute kidney failure, unspecified: Secondary | ICD-10-CM | POA: Diagnosis not present

## 2022-06-08 DIAGNOSIS — F419 Anxiety disorder, unspecified: Secondary | ICD-10-CM

## 2022-06-08 DIAGNOSIS — Z515 Encounter for palliative care: Secondary | ICD-10-CM | POA: Diagnosis not present

## 2022-06-08 DIAGNOSIS — R0602 Shortness of breath: Secondary | ICD-10-CM

## 2022-06-08 DIAGNOSIS — J9611 Chronic respiratory failure with hypoxia: Secondary | ICD-10-CM | POA: Diagnosis not present

## 2022-06-08 SURGERY — ESOPHAGOGASTRODUODENOSCOPY (EGD) WITH PROPOFOL
Anesthesia: Monitor Anesthesia Care

## 2022-06-08 MED ORDER — HALOPERIDOL LACTATE 5 MG/ML IJ SOLN
1.0000 mg | INTRAMUSCULAR | Status: DC | PRN
  Administered 2022-06-08: 1 mg via INTRAVENOUS
  Filled 2022-06-08: qty 1

## 2022-06-08 MED ORDER — HYDROMORPHONE BOLUS VIA INFUSION
2.0000 mg | INTRAVENOUS | Status: DC | PRN
  Administered 2022-06-08 – 2022-06-09 (×4): 2 mg via INTRAVENOUS

## 2022-06-08 MED ORDER — HYDROMORPHONE BOLUS VIA INFUSION: 1.0000 mg | INTRAVENOUS | Status: DC | PRN

## 2022-06-08 MED ORDER — LORAZEPAM 2 MG/ML IJ SOLN: 1.0000 mg | INTRAMUSCULAR | Status: DC | PRN

## 2022-06-08 MED ORDER — LORAZEPAM 2 MG/ML IJ SOLN
1.0000 mg | INTRAMUSCULAR | Status: DC
  Administered 2022-06-08 (×3): 1 mg via INTRAVENOUS
  Filled 2022-06-08 (×4): qty 1

## 2022-06-08 NOTE — Progress Notes (Signed)
Chaplain engaged in an initial visit with Jaray, his wife, daughter, and son.  Chaplain offered space to learn about [place] through storytelling.  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  Robinson is recognized for being a pillar of strength and provision for their family.    Mrs. Geibel shared that they have been married over 25 years and have an anniversary coming up on February 12th.  She became tearful in processing her grief and the coming loss of Lamark.  Chaplain offered prayer over Konstantine and his family.   Chaplain offered presence, listening, prayer, and support. Chaplain was also able to get them hot coffee.     06/08/22 1400  Clinical Encounter Type  Visited With Patient and family together  Visit Type Initial;Spiritual support;Patient actively dying  Spiritual Encounters  Spiritual Needs Emotional;Grief support;Prayer

## 2022-06-08 NOTE — Progress Notes (Signed)
[facility]                                                                               Benjamin Case, is a 67 y.o. male, DOB - 06-11-1955, EPP:[id number] Admit date - 05/24/2022    Outpatient Primary MD for the patient is Benjamin Jordan, MD  LOS - 5  days    Brief summary   67 year old white male stage IV metastatic lung CA,  L pneumonectomy 2009 adjuvant chemo 2009-started on 3 cycles of  palliative therapy, discontinued secondary to progression, presents with melanotic stools and generalized weakness. He was admitted for GI bleed and DAPT discontinued. GI consulted, recommended outpatient EGD.   Assessment & Plan    Assessment and Plan:    Acute  on chronic respiratory failure with hypoxia and hypercapnia in the setting of stage 4 metastatic lung ca; ABG showing respiratory acidosis with hypercapnia.   In view of his Progressive disease, palliative care consulted and he was transitioned to comfort care.   Metastatic lung cancer : bone , liver, lymph and brain metastasis.   L pneumonectomy 2009 adjuvant chemo 2009-started on 3 cycles of  palliative therapy, discontinued secondary to progression   GI bleed secondary ? Upper GI bleed.  Guiac positive stools. Transfuse to keep hemoglobin greater than 8.  Hemoglobin stable around 9.  Gi signed off,    PAF  Rate not well controlled.  Anti coagulation and DAPT held.  Soft BP parameters,  Keep  k >4 and magnesium greater than 2.  Cardiology signed off.    Hypomagnesemia:  Replaced.   Mild AKI:  Baseline creatinine around 1.2 , worsened to 1.64.  ? Poor oral intake.       Type 2 Diabetes mellitus:  diet controlled.  CBG (last 3)  Recent Labs    06/07/22 0802  GLUCAP 173*      Lower back pain sec to prior hemilaminectomy:     Leukocytosis and thrombocytosis:  ? Reactive.     Estimated body mass index is 24.01 kg/m as calculated from the following:   Height as of this encounter:  5\' 10"  (1.778 m).   Weight as of this encounter: 75.9 kg.  Code Status: DNR DVT Prophylaxis:     Level of Care: Level of care: Stepdown Family Communication: family at bedside.   Disposition Plan:     Remains inpatient appropriate:  ON bipapa.   Procedures:  None.   Consultants:   None.   Antimicrobials:   Anti-infectives (From admission, onward)    Start     Dose/Rate Route Frequency Ordered Stop   06/07/22 1200  azithromycin (ZITHROMAX) 500 mg in sodium chloride 0.9 % 250 mL IVPB  Status:  Discontinued        500 mg 250 mL/hr over 60 Minutes Intravenous Every 24 hours 06/07/22 1106 06/07/22 1516   06/07/22 1000  azithromycin (ZITHROMAX) 250 mg in dextrose 5 % 125 mL IVPB  Status:  Discontinued        250 mg 127.5 mL/hr over 60 Minutes Intravenous Every 24 hours 06/07/22 0914 06/07/22 1106        Medications  Scheduled Meds:  Chlorhexidine  Gluconate Cloth  6 each Topical Daily   LORazepam  1 mg Intravenous Q4H   Continuous Infusions:  HYDROmorphone 1 mg/hr (06/08/22 1057)   PRN Meds:.antiseptic oral rinse, glycopyrrolate, haloperidol lactate, HYDROmorphone, LORazepam, [DISCONTINUED] ondansetron **OR** ondansetron (ZOFRAN) IV, polyvinyl alcohol    Subjective:   Emet Rafanan was seen and examined today.   Ill appearing in distress.   Objective:   Vitals:   06/08/22 0801 06/08/22 0900 06/08/22 1000 06/08/22 1200  BP:      Pulse: (!) 144 (!) 117 (!) 131 (!) 113  Resp: 17 (!) 23 (!) 25 14  Temp:      TempSrc:      SpO2: 92% (!) 72% (!) 66% (!) 84%  Weight:      Height:        Intake/Output Summary (Last 24 hours) at 06/08/2022 1346 Last data filed at 06/08/2022 1057 Gross per 24 hour  Intake 319.22 ml  Output 850 ml  Net -530.78 ml    Filed Weights   06/05/22 0500 06/06/22 0441 06/07/22 0500  Weight: 75.8 kg 73.1 kg 75.9 kg     Exam General exam: ill appearing gentleman.  Respiratory system: diminished air entry  Cardiovascular system: S1 &  S2 heard,  Gastrointestinal system: Abdomen is  soft.       Data Reviewed:  I have personally reviewed following labs and imaging studies   CBC Lab Results  Component Value Date   WBC 18.9 (H) 06/07/2022   RBC 3.40 (L) 06/07/2022   HGB 10.4 (L) 06/07/2022   HCT 33.9 (L) 06/07/2022   MCV 99.7 06/07/2022   MCH 30.6 06/07/2022   PLT 587 (H) 06/07/2022   MCHC 30.7 06/07/2022   RDW 16.0 (H) 06/07/2022   LYMPHSABS 0.9 06/07/2022   MONOABS 1.3 (H) 06/07/2022   EOSABS 0.4 06/07/2022   BASOSABS 0.1 92/42/6834     Last metabolic panel Lab Results  Component Value Date   NA 134 (L) 06/07/2022   K 4.2 06/07/2022   CL 92 (L) 06/07/2022   CO2 33 (H) 06/07/2022   BUN 31 (H) 06/07/2022   CREATININE 1.64 (H) 06/07/2022   GLUCOSE 198 (H) 06/07/2022   GFRNONAA 45 (L) 06/07/2022   GFRAA >60 12/11/2018   CALCIUM 8.6 (L) 06/07/2022   PHOS 6.0 (H) 06/07/2022   PROT 6.6 06/06/2022   ALBUMIN 3.4 (L) 06/06/2022   BILITOT 0.8 06/06/2022   ALKPHOS 66 06/06/2022   AST 28 06/06/2022   ALT 25 06/06/2022   ANIONGAP 9 06/07/2022    CBG (last 3)  Recent Labs    06/07/22 0802  GLUCAP 173*       Coagulation Profile: Recent Labs  Lab 05/14/2022 1315  INR 1.1      Radiology Studies: DG CHEST PORT 1 VIEW  Result Date: 06/07/2022 CLINICAL DATA:  67 year old male with history of dyspnea. Increasing shortness of breath. EXAM: PORTABLE CHEST 1 VIEW COMPARISON:  Chest x-ray 06/04/2022. FINDINGS: Complete opacification of the left hemithorax again noted, similar to prior studies, related to prior left-sided pneumonectomy. Diffuse interstitial prominence and patchy ill-defined airspace opacities throughout the right lung, most evident in the right mid to lower lung. Small right pleural effusion. No pneumothorax. Cardiomediastinal structures are completely obscured. Atherosclerotic calcifications in the thoracic aorta. IMPRESSION: 1. The appearance of the right lung is concerning for  bronchitis and probable bronchopneumonia, as above. 2. Status post left pneumonectomy. 3. Aortic atherosclerosis. Electronically Signed   By: Vinnie Langton M.D.   On:  06/07/2022 08:32       Hosie Poisson M.D. [facility] 06/08/2022, 1:46 PM  Available via Epic secure chat 7am-7pm After 7 pm, please refer to night coverage provider listed on amion.

## 2022-06-08 NOTE — Progress Notes (Signed)
Daily Progress Note   Patient Name: Benjamin Case       Date: 06/08/2022 DOB: October 04, 1955  Age: 67 y.o. MRN#: [id number] Attending Physician: Hosie Poisson, MD Primary Care Physician: [name], MD Admit Date: 05/08/2022 Length of Stay: 5 days  Reason for Consultation/Follow-up: Terminal Care  Subjective:   CC: Feels that he cannot breathe.  Following up regarding symptom management at the end of life.  Subjective:  Discussed care with bedside RN prior to seeing patient this morning.  On review of EMR patient's respiratory rate and heart rate uncontrolled.  Decreased basal dose of Dilaudid prior to checking on patient to allow time to determine if increase helping. Followed up after increase in discussion with nurse at bedside.  Patient still feeling that he cannot catch his breath.  Patient coughing up phlegm.  Patient using accessory muscles to breathe.  Patient looks anxious because he cannot catch his breath.  Discussed with patient and wife at bedside increasing the continuous Dilaudid drip further and also scheduling medication for agitation.  Patient and wife agreeing with this plan.  Informed them that if symptoms remain uncontrolled, please inform bedside RN so medications can be further adjusted since focus is on comfort at this time.  Wife did share that children stayed late overnight to enjoy visiting with patient. Daughters likely to return today. Encouraged time with family.  Review of Systems Dyspnea and anxiety Objective:   Vital Signs:  BP (!) 92/40   Pulse (!) 122   Temp 98.3 F (36.8 C) (Axillary)   Resp 20   Ht 5\' 10"  (1.778 m)   Wt 75.9 kg   SpO2 90%   BMI 24.01 kg/m   Physical Exam: General:awake, feels he cannot breathe, anxious Eyes: No drainage noted HENT: Dry mucous membranes Cardiovascular: Tachycardia noted Respiratory: continues to have increased work of breathing with accessory muscle use present  Skin: Pale Neuro: more interactive  today  Imaging:  I personally reviewed recent imaging.   Assessment & Plan:   Assessment: Patient is a 67 year old male with a past medical history of stage IV metastatic lung cancer with mets to bone, liver, and brain, and left pneumonectomy in 2009 who is receiving palliative chemo until this was discontinued secondary to progression who was admitted on 05/21/2022 for management of melanotic stools and generalized weakness.  During admission.  Patient has been managed for acute on chronic respiratory failure with hypoxia and hypercapnia in the setting of lung cancer and imaging showing bronchopneumonia.  Patient has also had difficulties with rate control in setting of A-fib with RVR.  GI evaluated patient due to upper GI bleed.  Rapid response called on 1/1 with deterioration of respiratory status and patient to ICU for management.  Palliative medicine team consulted to assist with complex medical decision making. Of note, patient seen by palliative medicine team in December/2023.  Recommendations/Plan: # Complex medical decision making/goals of care:  -Patient transitioned to full comfort care on 06/07/21. Continue only interventions that focus on symptom management of pain, dyspnea, and agitation in the setting of end-of-life care.  Prognosis: Hours - Days with respiratory status  # Symptom management:    -Pain/Dyspnea, acute on chronic in the setting of stage IV lung cancer and end-of-life care                Patient's dyspnea remains uncontrolled despite multiple medications adjustments. Will continue to adjust as needed since goal is comfort and for patient "not to suffer".  Patient  likely to have heavy respiratory symptoms due to his underlying medical processes.                                -Increase IV Dilaudid to 1mg /h continuous basal infusion.  Increase IV Dilaudid to 2 mg bolus every 30 minutes as needed. Continue to adjust based on patient's symptom burden.                    -Anxiety/agitation, in the setting of end-of-life care   Anxiety associated with dyspnea uncontrolled.    -Start Ativan 1mg  q4hrs scheduled.                                -Increase IV Ativan to 1 mg every 4 hours as needed for breakthrough anxiety. Continue to adjust based on patient's symptom burden.                                 -Increase IV Haldol 0.5 mg every 4 hours as needed. Continue to adjust based on patient's symptom burden.                   -Secretions, in the setting of end-of-life care                               -Continue IV glycopyrrolate 0.2 mg every 4 hours as needed.   # Psycho-social/Spiritual Support:  - Support System: Wife, 4 children   # Discharge Planning: Transitioning to comfort focused care at this time.  Patient will likely have in-hospital death due to deteriorating respiratory status in setting of stage IV lung cancer and bronchopneumonia.  Discussed with: patient, wife, bedside RN  Thank you for allowing the palliative care team to participate in the care Benjamin Case.  Chelsea Aus, DO Palliative Care Provider PMT # [phone]  If patient remains symptomatic despite maximum doses, please call PMT at [phone] between 0700 and 1900. Outside of these hours, please call attending, as PMT does not have night coverage.

## 2022-06-09 ENCOUNTER — Ambulatory Visit: Payer: Medicare Other | Admitting: Internal Medicine

## 2022-06-09 ENCOUNTER — Ambulatory Visit: Payer: Medicare Other

## 2022-06-09 ENCOUNTER — Other Ambulatory Visit: Payer: Medicare Other

## 2022-06-09 DIAGNOSIS — K219 Gastro-esophageal reflux disease without esophagitis: Secondary | ICD-10-CM

## 2022-06-10 ENCOUNTER — Other Ambulatory Visit: Payer: Medicare Other

## 2022-06-10 ENCOUNTER — Ambulatory Visit: Payer: Medicare Other | Admitting: Physician Assistant

## 2022-06-10 ENCOUNTER — Ambulatory Visit: Payer: Medicare Other

## 2022-06-12 ENCOUNTER — Ambulatory Visit: Payer: Medicare Other

## 2022-06-14 ENCOUNTER — Ambulatory Visit: Payer: Medicare Other | Admitting: Internal Medicine

## 2022-06-15 ENCOUNTER — Ambulatory Visit (HOSPITAL_BASED_OUTPATIENT_CLINIC_OR_DEPARTMENT_OTHER): Payer: Medicare Other | Admitting: Cardiology

## 2022-06-17 ENCOUNTER — Ambulatory Visit: Payer: Medicare Other

## 2022-06-17 ENCOUNTER — Ambulatory Visit: Payer: Medicare Other | Admitting: Internal Medicine

## 2022-06-17 ENCOUNTER — Other Ambulatory Visit: Payer: Medicare Other

## 2022-06-19 ENCOUNTER — Ambulatory Visit: Payer: Medicare Other

## 2022-06-24 ENCOUNTER — Ambulatory Visit: Payer: Medicare Other

## 2022-06-24 ENCOUNTER — Other Ambulatory Visit: Payer: Medicare Other

## 2022-06-24 ENCOUNTER — Ambulatory Visit: Payer: Medicare Other | Admitting: Internal Medicine

## 2022-07-01 ENCOUNTER — Other Ambulatory Visit: Payer: Medicare Other

## 2022-07-01 ENCOUNTER — Ambulatory Visit: Payer: Medicare Other

## 2022-07-08 NOTE — Death Summary Note (Signed)
DEATH SUMMARY   Patient Details  Name: Benjamin Case MRN: [id number] DOB: January 22, 1956 JKK:[id number], Ivin Booty, MD Admission/Discharge Information   Admit Date:  30-Jun-2022  Date of Death: Date of Death: 07/06/22  Time of Death: Time of Death: 09-10-2032  Length of Stay: 6   Principle Cause of death:   Respiratory failure .   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)   Paroxysmal atrial fibrillation (HCC)   History of embolic stroke   Chronic hypoxic respiratory failure (HCC)   AKI (acute kidney injury) (HCC)   Chronic diastolic CHF (congestive heart failure) (HCC)   HLD (hyperlipidemia)   Leukocytosis   GERD (gastroesophageal reflux disease)   Anxiety   Hypocalcemia   COPD with acute exacerbation (HCC)   Palliative care encounter   Acute on chronic respiratory failure with hypoxia and hypercapnia (HCC)   End of life care   DNR (do not resuscitate)   High risk medication use   Hospital Course: 67 year old white male stage IV metastatic lung CA, L pneumonectomy 11-Sep-2007 adjuvant chemo 2009-started on 3 cycles of palliative therapy, discontinued secondary to progression, presents with melanotic stools and generalized weakness. He was admitted for GI bleed . Unable to undergo any EGD due to tenable resp status. Palliative care consulted and family decided to transition to comfort measures. Patient passed at 12:34 M.   Assessment and Plan:  Acute  on chronic respiratory failure with hypoxia and hypercapnia in the setting of stage 4 metastatic lung ca; ABG shows respiratory acidosis with hypercapnia. Started him on BIPAP, repeat CXR shows possible pneumonia. Left hemithorax is completely opacified. Left lung shows possible bronchopneumonia.  67 year old white male was transferred to stepdown for closer monitoring.   Suspect from fluid overload due to transfusions./ ?  Pneumonia.  He is currently on IV lasix 40 mg daily. PCCM on board.  Continue with strict intake and output.  Patient continues to worsen despite BIPAP . Palliative care consulted and family decided to transition to comfort measures.    Metastatic lung cancer : bone , liver, lymph and brain metastasis.   L pneumonectomy 09-11-2007 adjuvant chemo 2009-started on 3 cycles of  palliative therapy, discontinued secondary to progression Follows up with Dr Julien Nordmann.      GI bleed secondary ? Upper GI bleed.  Guiac positive stools.GI consulte,d but pt too unstable to undergo EGD at this time.      PAF  Rate not well controlled.  Anti coagulation and DAPT held due to GI bleed.       Hypomagnesemia:  Replaced.      Mild AKI:  Baseline creatinine around 1.2 , worsened to 1.64.  ? Poor oral intake.            Type 2 Diabetes mellitus:  diet controlled.       Lower back pain sec to prior hemilaminectomy:       Leukocytosis and thrombocytosis:  ? Reactive.      Procedures: none.   Consultations: PCCM PALLIATIVE CARE GASTROENTEROLOGY.   The results of significant diagnostics from this hospitalization (including imaging, microbiology, ancillary and laboratory) are listed below for reference.   Significant Diagnostic Studies: DG CHEST PORT 1 VIEW  Result Date: 06/07/2022 CLINICAL DATA:  67 year old male with history of dyspnea. Increasing shortness of breath. EXAM: PORTABLE CHEST 1 VIEW COMPARISON:  Chest x-ray 06/04/2022. FINDINGS: Complete opacification of  the left hemithorax again noted, similar to prior studies, related to prior left-sided pneumonectomy. Diffuse interstitial prominence and patchy ill-defined airspace opacities throughout the right lung, most evident in the right mid to lower lung. Small right pleural effusion. No pneumothorax. Cardiomediastinal structures are completely obscured. Atherosclerotic calcifications in the thoracic aorta. IMPRESSION: 1. The appearance of  the right lung is concerning for bronchitis and probable bronchopneumonia, as above. 2. Status post left pneumonectomy. 3. Aortic atherosclerosis. Electronically Signed   By: Vinnie Langton M.D.   On: 06/07/2022 08:32   DG CHEST PORT 1 VIEW  Result Date: 06/04/2022 CLINICAL DATA:  Shortness of breath. EXAM: PORTABLE CHEST 1 VIEW COMPARISON:  Chest radiograph dated 05/20/2022. FINDINGS: Similar appearance of complete opacification of the left hemithorax with volume loss and shift of the mediastinum into the left hemithorax. Diffuse right lung interstitial densities which may represent edema, or pneumonia. No pneumothorax. Atherosclerotic calcification of the aorta. No acute osseous pathology. IMPRESSION: No significant interval change. Electronically Signed   By: Anner Crete M.D.   On: 06/04/2022 02:28   US RENAL  Result Date: 05/13/2022 CLINICAL DATA:  Acute kidney injury EXAM: RENAL / URINARY TRACT ULTRASOUND COMPLETE COMPARISON:  05/13/2022 FINDINGS: Right Kidney: Renal measurements: 10.1 x 4.4 x 5.4 cm = volume: 125 mL. Echogenicity within normal limits. No mass, shadowing stone, or hydronephrosis visualized. Left Kidney: Renal measurements: 10.8 x 5.7 x 5.2 cm = volume: 167 mL. Echogenicity within normal limits. 2.9 cm simple cyst in the lower pole of the left kidney. No solid mass, shadowing stone, or hydronephrosis visualized. Bladder: Appears normal for degree of bladder distention. Other: Numerous hypoechoic liver masses.  Right-sided pleural effusion. IMPRESSION: 1. No evidence of obstructive uropathy. 2. Numerous hypoechoic liver masses, concerning for metastatic disease. 3. Right-sided pleural effusion. Electronically Signed   By: Davina Poke D.O.   On: 05/24/2022 19:07   DG Chest 2 View  Result Date: 05/21/2022 CLINICAL DATA:  Shortness of breath and pleural effusion. EXAM: CHEST - 2 VIEW COMPARISON:  May 12, 2022 FINDINGS: The heart size and mediastinal contours are  stable. Complete opacity of the left hemithorax unchanged. Increased pulmonary interstitium with ground-glass opacities are identified throughout the right lung. Minimal right pleural effusion is noted. The visualized skeletal structures are stable. IMPRESSION: 1. Complete opacity of the left hemithorax unchanged. 2. Increased pulmonary interstitium with ground-glass opacities throughout the right lung consistent with pulmonary edema. Electronically Signed   By: Abelardo Diesel M.D.   On: 05/21/2022 13:39   US RENAL  Result Date: 05/13/2022 CLINICAL DATA:  Acute kidney injury EXAM: RENAL / URINARY TRACT ULTRASOUND COMPLETE COMPARISON:  PET CT 03/12/2022 FINDINGS: Right Kidney: Renal measurements: 10.6 x 5.1 x 4.1 cm = volume: 116 mL. Echogenicity within normal limits. No mass or hydronephrosis visualized. Left Kidney: Renal measurements: 11.5 x 6.1 x 4.2 cm = volume: 155 2.9 cm simple cyst mL. Echogenicity within normal limits. No mass or hydronephrosis visualized. Is noted within the lower pole, best characterized as a Bosniak class 1 cyst. No follow-up imaging is recommended for this lesion. Bladder: Appears normal for degree of bladder distention. Other: Multiple solid nodules are seen scattered throughout the visualized liver measuring up to 2.8 cm, concerning for development of multiple hepatic metastases since prior PET CT examination. Small right pleural effusion noted. IMPRESSION: 1. Normal renal sonogram. 2. Multiple solid nodules scattered throughout the visualized liver, concerning for development of multiple hepatic metastases since prior PET CT examination. 3. Small right pleural  effusion. Electronically Signed   By: Fidela Salisbury M.D.   On: 05/13/2022 21:16   DG Chest Port 1 View  Result Date: 05/12/2022 CLINICAL DATA:  67 year old male with hypoxia. History of lung cancer, left pneumonectomy. EXAM: PORTABLE CHEST 1 VIEW COMPARISON:  Chest CTA 05/10/2022 and earlier. FINDINGS: Portable AP semi  upright view at 0631 hours. Coarse reticulonodular opacity in the right lung does appear progressed since last month and corresponds to abnormal lung opacity on the CTA 2 days ago. Layering small to moderate right pleural effusion at that time better demonstrated on CT. Surgically absent left lung with stable leftward shift of the mediastinum. Calcified aortic atherosclerosis. Stable visualized osseous structures. IMPRESSION: 1. Coarse reticulonodular opacity in the right lung corresponding to abnormal opacity on CTA 2 days ago more suspicious for acute infection than pulmonary edema. Right pleural effusion better demonstrated on CT. 2. Left pneumonectomy. Electronically Signed   By: Genevie Ann M.D.   On: 05/12/2022 06:56   CT Angio Chest PE W and/or Wo Contrast  Result Date: 05/10/2022 CLINICAL DATA:  Pulmonary embolism suspected, high probability. Increasing shortness of breath. History of lung cancer and currently being treated. Left lung removed. EXAM: CT ANGIOGRAPHY CHEST WITH CONTRAST TECHNIQUE: Multidetector CT imaging of the chest was performed using the standard protocol during bolus administration of intravenous contrast. Multiplanar CT image reconstructions and MIPs were obtained to evaluate the vascular anatomy. RADIATION DOSE REDUCTION: This exam was performed according to the departmental dose-optimization program which includes automated exposure control, adjustment of the mA and/or kV according to patient size and/or use of iterative reconstruction technique. CONTRAST:  2mL OMNIPAQUE IOHEXOL 350 MG/ML SOLN COMPARISON:  04/11/2022. FINDINGS: Cardiovascular: Heart is enlarged and there is a small pericardial effusion. Multi-vessel coronary artery calcifications are noted. There is atherosclerotic calcification of the aorta without evidence aneurysm. Pulmonary trunk is distended suggesting underlying pulmonary artery hypertension. No evidence of pulmonary embolism. Evaluation of the subsegmental  arteries is limited. Mediastinum/Nodes: A conglomeration of enlarged lymph nodes are present in the mediastinum and right hilum, measuring up to 0 4.6 x 2.5 cm in the precarinal space versus 2.8 x 4.2 cm on the previous exam. No axillary lymphadenopathy. The thyroid gland, trachea, and esophagus are within normal limits. Lungs/Pleura: Status post left pneumonectomy with chronic fibrothorax. There is a small right pleural effusion. Hazy attenuation is noted in the right lung. Multiple pulmonary nodules are seen in the right lung. An azygous lobe is noted. Patchy infiltrates are noted in the posterior aspect of the right upper lobe and right middle lobe. No pneumothorax. Upper Abdomen: Multiple ill-defined hypodensities are present in the liver measuring up to 2.4 cm. There is a left adrenal nodule measuring 2.1 cm, previously characterized as an adenoma. There is nodular thickening of the right adrenal gland. Scattered nodules are noted in the retroperitoneum bilaterally. Musculoskeletal: Scattered lucencies are noted in the bones, compatible with known metastatic disease and increased in size and number from the previous exam. There is a stable compression deformity in the superior endplate at T9. Stable postsurgical changes in the left ribs. Stable fracture in the T9 rib on the left. Stable old healed rib fractures on the right. Review of the MIP images confirms the above findings. IMPRESSION: 1. No evidence of pulmonary embolism. 2. Hazy attenuation in the right lung with patchy infiltrates in the right upper and middle lobes. 3. Small right pleural effusion. 4. Multiple pulmonary nodules in the right lung with mediastinal and right hilar lymphadenopathy,  possible metastasis. 5. Multiple hepatic lesions and scattered nodules in the retroperitoneum bilaterally, concerning for metastatic disease. 6. Stable postsurgical changes of left pneumonectomy. 7. Increasing lucencies in the bones, compatible with known  metastatic disease. Stable rib fracture at T9 on the left. 8. Aortic atherosclerosis. Electronically Signed   By: Brett Fairy M.D.   On: 05/10/2022 20:24   DG Chest 2 View  Result Date: 05/10/2022 CLINICAL DATA:  Shortness of breath, lung carcinoma EXAM: CHEST - 2 VIEW COMPARISON:  Previous studies including the examination of 04/28/2022 FINDINGS: There is previous left pneumonectomy. There is a interval increase in interstitial markings throughout the right lung. There is 1.5 cm nodular density in the right apical region with no significant change. Azygous fissure is seen in right upper lung field. There is minimal blunting of right lateral CP angle. IMPRESSION: There is significant interval increase in interstitial markings in right lung suggesting interstitial pneumonia or interstitial edema. Status post left pneumonectomy. Small right pleural effusion. There is 1.5 cm nodule in right apical region with no significant change. Electronically Signed   By: Elmer Picker M.D.   On: 05/10/2022 15:48    Microbiology: No results found for this or any previous visit (from the past 240 hour(s)).  Time spent: 35 minutes  Signed: Hosie Poisson, MD

## 2022-07-08 NOTE — Telephone Encounter (Signed)
No changes made

## 2022-07-08 NOTE — Progress Notes (Signed)
Asystole noted on monitor at 1234. No respirations, family at bedside with patient. 2 RN verified absence of heart beat. Provider Olena Heckle notified.

## 2022-07-08 NOTE — Progress Notes (Signed)
    OVERNIGHT PROGRESS REPORT  Notified by RN that patient has expired at 0034 hrs  Patient was DNR/comfort care followed by Palliative.  2 RN verified.  Family was available to RN and present at bedside .     Gershon Cull MSNA [facility] Acute Care Nurse Practitioner [place]

## 2022-07-08 DEATH — deceased

## 2022-07-15 ENCOUNTER — Ambulatory Visit: Payer: Medicare Other

## 2022-07-15 ENCOUNTER — Ambulatory Visit: Payer: Medicare Other | Admitting: Internal Medicine

## 2022-07-15 ENCOUNTER — Other Ambulatory Visit: Payer: Medicare Other

## 2022-07-22 ENCOUNTER — Other Ambulatory Visit: Payer: Medicare Other

## 2022-07-22 ENCOUNTER — Ambulatory Visit: Payer: Medicare Other

## 2022-07-28 ENCOUNTER — Ambulatory Visit (HOSPITAL_BASED_OUTPATIENT_CLINIC_OR_DEPARTMENT_OTHER): Payer: Medicare Other | Admitting: Cardiology
